# Patient Record
Sex: Male | Born: 1940 | Race: Black or African American | Hispanic: No | Marital: Married | Smoking: Never smoker
Health system: Southern US, Community
[De-identification: ages and names within clinical notes are randomized; demographics above are authoritative.]

## PROBLEM LIST (undated history)

## (undated) DIAGNOSIS — G4730 Sleep apnea, unspecified: Secondary | ICD-10-CM

## (undated) DIAGNOSIS — D126 Benign neoplasm of colon, unspecified: Secondary | ICD-10-CM

## (undated) DIAGNOSIS — Z95810 Presence of automatic (implantable) cardiac defibrillator: Secondary | ICD-10-CM

## (undated) DIAGNOSIS — N529 Male erectile dysfunction, unspecified: Secondary | ICD-10-CM

## (undated) DIAGNOSIS — K769 Liver disease, unspecified: Secondary | ICD-10-CM

## (undated) DIAGNOSIS — I10 Essential (primary) hypertension: Secondary | ICD-10-CM

## (undated) DIAGNOSIS — I428 Other cardiomyopathies: Secondary | ICD-10-CM

## (undated) DIAGNOSIS — K5790 Diverticulosis of intestine, part unspecified, without perforation or abscess without bleeding: Secondary | ICD-10-CM

## (undated) DIAGNOSIS — K648 Other hemorrhoids: Secondary | ICD-10-CM

## (undated) DIAGNOSIS — K603 Anal fistula: Secondary | ICD-10-CM

## (undated) DIAGNOSIS — R519 Headache, unspecified: Secondary | ICD-10-CM

## (undated) DIAGNOSIS — R51 Headache: Secondary | ICD-10-CM

## (undated) DIAGNOSIS — N189 Chronic kidney disease, unspecified: Secondary | ICD-10-CM

## (undated) DIAGNOSIS — M1990 Unspecified osteoarthritis, unspecified site: Secondary | ICD-10-CM

## (undated) DIAGNOSIS — I509 Heart failure, unspecified: Secondary | ICD-10-CM

## (undated) DIAGNOSIS — N281 Cyst of kidney, acquired: Secondary | ICD-10-CM

## (undated) DIAGNOSIS — M109 Gout, unspecified: Secondary | ICD-10-CM

## (undated) HISTORY — DX: Gout, unspecified: M10.9

## (undated) HISTORY — DX: Other hemorrhoids: K64.8

## (undated) HISTORY — DX: Cyst of kidney, acquired: N28.1

## (undated) HISTORY — DX: Anal fistula: K60.3

## (undated) HISTORY — PX: TONSILLECTOMY: SUR1361

## (undated) HISTORY — DX: Chronic kidney disease, unspecified: N18.9

## (undated) HISTORY — DX: Benign neoplasm of colon, unspecified: D12.6

## (undated) HISTORY — DX: Male erectile dysfunction, unspecified: N52.9

## (undated) HISTORY — DX: Other cardiomyopathies: I42.8

## (undated) HISTORY — DX: Diverticulosis of intestine, part unspecified, without perforation or abscess without bleeding: K57.90

## (undated) HISTORY — DX: Unspecified osteoarthritis, unspecified site: M19.90

## (undated) HISTORY — DX: Liver disease, unspecified: K76.9

## (undated) HISTORY — DX: Essential (primary) hypertension: I10

---

## 2001-10-16 DIAGNOSIS — K603 Anal fistula, unspecified: Secondary | ICD-10-CM

## 2001-10-16 HISTORY — DX: Anal fistula: K60.3

## 2001-10-16 HISTORY — DX: Anal fistula, unspecified: K60.30

## 2002-01-14 HISTORY — PX: ANAL FISTULOTOMY: SHX6423

## 2002-01-27 ENCOUNTER — Encounter: Payer: Self-pay | Admitting: General Surgery

## 2002-01-27 ENCOUNTER — Ambulatory Visit (HOSPITAL_COMMUNITY): Admission: RE | Admit: 2002-01-27 | Discharge: 2002-01-27 | Payer: Self-pay | Admitting: General Surgery

## 2004-11-14 ENCOUNTER — Ambulatory Visit: Payer: Self-pay | Admitting: Gastroenterology

## 2004-11-28 ENCOUNTER — Ambulatory Visit: Payer: Self-pay | Admitting: Gastroenterology

## 2005-10-16 DIAGNOSIS — K769 Liver disease, unspecified: Secondary | ICD-10-CM

## 2005-10-16 DIAGNOSIS — N281 Cyst of kidney, acquired: Secondary | ICD-10-CM

## 2005-10-16 HISTORY — DX: Liver disease, unspecified: K76.9

## 2005-10-16 HISTORY — DX: Cyst of kidney, acquired: N28.1

## 2005-11-15 ENCOUNTER — Encounter: Admission: RE | Admit: 2005-11-15 | Discharge: 2005-11-15 | Payer: Self-pay | Admitting: Nephrology

## 2005-12-02 ENCOUNTER — Encounter: Admission: RE | Admit: 2005-12-02 | Discharge: 2005-12-02 | Payer: Self-pay | Admitting: Internal Medicine

## 2009-10-16 DIAGNOSIS — D126 Benign neoplasm of colon, unspecified: Secondary | ICD-10-CM

## 2009-10-16 HISTORY — DX: Benign neoplasm of colon, unspecified: D12.6

## 2009-10-28 ENCOUNTER — Encounter (INDEPENDENT_AMBULATORY_CARE_PROVIDER_SITE_OTHER): Payer: Self-pay | Admitting: *Deleted

## 2009-11-11 ENCOUNTER — Encounter (INDEPENDENT_AMBULATORY_CARE_PROVIDER_SITE_OTHER): Payer: Self-pay

## 2009-11-12 ENCOUNTER — Ambulatory Visit: Payer: Self-pay | Admitting: Gastroenterology

## 2009-12-21 ENCOUNTER — Ambulatory Visit: Payer: Self-pay | Admitting: Gastroenterology

## 2009-12-23 ENCOUNTER — Encounter: Payer: Self-pay | Admitting: Gastroenterology

## 2010-11-15 NOTE — Letter (Signed)
Summary: Moviprep Instructions  [facility]  [address]   [place], [place] [address]   Phone: [phone]  Fax: [phone]       Miguel Roach    06-Feb-1941    MRN: [id number]        Procedure Day /Date: Wednesday 12-01-09     Arrival Time: 9:30 a.m.     Procedure Time: 10:30 a.m.     Location of Procedure:                    x   [place] (4th Floor)                        [place]   Starting 5 days prior to your procedure 11-26-09  do not eat nuts, seeds, popcorn, corn, beans, peas,  salads, or any raw vegetables.  Do not take any fiber supplements (e.g. Metamucil, Citrucel, and Benefiber).  THE DAY BEFORE YOUR PROCEDURE         DATE:  11-30-09  DAY: Tuesday  1.  Drink clear liquids the entire day-NO SOLID FOOD  2.  Do not drink anything colored red or purple.  Avoid juices with pulp.  No orange juice.   3.  Drink at least 64 oz. (8 glasses) of fluid/clear liquids during the day to prevent dehydration and help the prep work efficiently.  CLEAR LIQUIDS INCLUDE: Water Jello Ice Popsicles Tea (sugar ok, no milk/cream) Powdered fruit flavored drinks Coffee (sugar ok, no milk/cream) Gatorade Juice: apple, white grape, white cranberry  Lemonade Clear bullion, consomm, broth Carbonated beverages (any kind) Strained chicken noodle soup Hard Candy                             4.  In the morning, mix first dose of MoviPrep solution:    Empty 1 Pouch A and 1 Pouch B into the disposable container    Add lukewarm drinking water to the top line of the container. Mix to dissolve    Refrigerate (mixed solution should be used within 24 hrs)  5.  Begin drinking the prep at 5:00 p.m. The MoviPrep container is divided by 4 marks.   Every 15 minutes drink the solution down to the next mark (approximately 8 oz) until the full liter is complete.   6.  Follow completed prep with 16 oz of clear liquid of your choice  (Nothing red or purple).  Continue to drink clear liquids until bedtime.  7.  Before going to bed, mix second dose of MoviPrep solution:    Empty 1 Pouch A and 1 Pouch B into the disposable container    Add lukewarm drinking water to the top line of the container. Mix to dissolve    Refrigerate  THE DAY OF YOUR PROCEDURE      DATE:  12-01-09  DAY: Wednesday  Beginning at 5:30 a.m. (5 hours before procedure):         1. Every 15 minutes, drink the solution down to the next mark (approx 8 oz) until the full liter is complete.  2. Follow completed prep with 16 oz. of clear liquid of your choice.    3. You may drink clear liquids until  8:30 a.m.  (2 HOURS BEFORE PROCEDURE).   MEDICATION INSTRUCTIONS  Unless otherwise instructed, you should take regular prescription medications with a small sip of water  as early as possible the morning of your procedure.    Additional medication instructions: _Do not take fluid pill am of procedure.         OTHER INSTRUCTIONS  You will need a responsible adult at least 69 years of age to accompany you and drive you home.   This person must remain in the waiting room during your procedure.  Wear loose fitting clothing that is easily removed.  Leave jewelry and other valuables at home.  However, you may wish to bring a book to read or  an iPod/MP3 player to listen to music as you wait for your procedure to start.  Remove all body piercing jewelry and leave at home.  Total time from sign-in until discharge is approximately 2-3 hours.  You should go home directly after your procedure and rest.  You can resume normal activities the  day after your procedure.  The day of your procedure you should not:   Drive   Make legal decisions   Operate machinery   Drink alcohol   Return to work  You will receive specific instructions about eating, activities and medications before you leave.    The above instructions have been reviewed  and explained to me by   Cornelia Copa RN  November 12, 2009 1:56 PM     I fully understand and can verbalize these instructions _____________________________ Date _________

## 2010-11-15 NOTE — Letter (Signed)
Summary: Colonoscopy Letter  [facility]  [place], [place] [address]   Phone: [phone]  Fax: [phone]      October 28, 2009 MRN: [id number]   [facility] [address] [place], [place]  [address]   Dear Mr. Kinnear,   According to your medical record, it is time for you to schedule a Colonoscopy. The American Cancer Society recommends this procedure as a method to detect early colon cancer. Patients with a family history of colon cancer, or a personal history of colon polyps or inflammatory bowel disease are at increased risk.  This letter has beeen generated based on the recommendations made at the time of your procedure. If you feel that in your particular situation this may no longer apply, please contact our office.  Please call our office at [phone] to schedule this appointment or to update your records at your earliest convenience.  Thank you for cooperating with [place] to provide you with the very best care possible.   Sincerely,  Norberto Sorenson T. Fuller Plan, M.D.  [facility]astroenterology [facility] [phone]

## 2010-11-15 NOTE — Procedures (Signed)
Summary: Colonoscopy  Patient: [name] Note: All result statuses are Final unless otherwise noted.  Tests: (1) Colonoscopy (COL)   COL Colonoscopy           [address]     [place], [place]  [address]           COLONOSCOPY PROCEDURE REPORT           PATIENT:  Miguel Roach, Miguel Roach  MR#:  [id number]     BIRTHDATE:  July 11, 1941, 36 yrs. old  GENDER:  male           ENDOSCOPIST:  Norberto Sorenson T. Fuller Plan, MD, [facility]           PROCEDURE DATE:  12/21/2009     PROCEDURE:  Colonoscopy with biopsy     ASA CLASS:  Class II     INDICATIONS:  1) follow-up of polyp, adenomatous polyp, 10/2001.           MEDICATIONS:   Fentanyl 50 mcg IV, Versed 3 mg IV           DESCRIPTION OF PROCEDURE:   After the risks benefits and     alternatives of the procedure were thoroughly explained, informed     consent was obtained.  Digital rectal exam was performed and     revealed no abnormalities.   The LB PCF-H180AL [id number] endoscope     was introduced through the anus and advanced to the cecum, which     was identified by both the appendix and ileocecal valve, without     limitations.  The quality of the prep was excellent, using     MoviPrep.  The instrument was then slowly withdrawn as the colon     was fully examined.     <<PROCEDUREIMAGES>>           FINDINGS:  A sessile polyp was found in the proximal transverse     colon. It was 4 mm in size. The polyp was removed using cold     biopsy forceps.  Mild diverticulosis was found in the sigmoid     colon.  his was otherwise a normal examination of the colon.     Retroflexed views in the rectum revealed internal hemorrhoids,     small. The time to cecum =  1.75  minutes. The scope was then     withdrawn (time =  8.5  min) from the patient and the procedure     completed.           COMPLICATIONS:  None           ENDOSCOPIC IMPRESSION:     1) 4 mm sessile polyp in the proximal transverse colon     2) Mild diverticulosis in  the sigmoid colon     3) Internal hemorrhoids           RECOMMENDATIONS:     1) Await pathology results     2) High fiber diet with liberal fluid intake.     3) Repeat Colonoscopy in 5 years.           Pricilla Riffle. Fuller Plan, MD, [facility]           CC: Alden Server, MD           n.     Lorrin MaisPricilla Riffle. Stark at 12/21/2009 12:15 PM           Laurance Flatten,  [place], [place] [id number]  Note: An exclamation mark (!) indicates a result that was not dispersed into the flowsheet. Document Creation Date: 12/21/2009 12:15 PM _______________________________________________________________________  (1) Order result status: Final Collection or observation date-time: 12/21/2009 12:09 Requested date-time:  Receipt date-time:  Reported date-time:  Referring Physician:   Ordering Physician: Lucio Edward [phone]) Specimen Source:  Source: Tawanna Cooler Order Number: [id number] Lab site:   Appended Document: Colonoscopy     Procedures Next Due Date:    Colonoscopy: 12/2014

## 2010-11-15 NOTE — Letter (Signed)
Summary: Patient Notice- Polyp Results  [facility]  [address] [place], [place] [address]   Phone: [phone]  Fax: [phone]        December 23, 2009 MRN: [id number]    [facility] [address] [place], [place]  [address]    Dear Mr. Weedon,  I am pleased to inform you that the colon polyp(s) removed during your recent colonoscopy was (were) found to be benign (no cancer detected) upon pathologic examination.  I recommend you have a repeat colonoscopy examination in 5 years to look for recurrent polyps, as having colon polyps increases your risk for having recurrent polyps or even colon cancer in the future.  Should you develop new or worsening symptoms of abdominal pain, bowel habit changes or bleeding from the rectum or bowels, please schedule an evaluation with either your primary care physician or with me.  Continue treatment plan as outlined the day of your exam.  Please call us if you are having persistent problems or have questions about your condition that have not been fully answered at this time.  Sincerely,  Ladene Artist MD [facility]  This letter has been electronically signed by your physician.  Appended Document: Patient Notice- Polyp Results Letter mailed 3.10.11

## 2010-11-15 NOTE — Miscellaneous (Signed)
Summary: Lec previsit  Clinical Lists Changes  Medications: Added new medication of MOVIPREP 100 GM  SOLR (PEG-KCL-NACL-NASULF-NA ASC-C) As per prep instructions. - Signed Rx of MOVIPREP 100 GM  SOLR (PEG-KCL-NACL-NASULF-NA ASC-C) As per prep instructions.;  #1 x 0;  Signed;  Entered by: Cornelia Copa RN;  Authorized by: Ladene Artist MD [facility];  Method used: Electronically to [facility] [phone]*, [address] [place], [place], [place]  [address], Ph: [phone], Fax: [phone] Observations: Added new observation of ALLERGY REV: Done (11/12/2009 13:21)    Prescriptions: MOVIPREP 100 GM  SOLR (PEG-KCL-NACL-NASULF-NA ASC-C) As per prep instructions.  #1 x 0   Entered by:   Cornelia Copa RN   Authorized by:   Ladene Artist MD [facility]   Signed by:   Cornelia Copa RN on 11/12/2009   Method used:   Electronically to        [facility] [phone]* (retail)       [address]       [place],   [address]       Ph: [phone]       Fax: [phone]   RxID:   [id number]

## 2011-03-03 NOTE — Op Note (Signed)
[facility]  Patient:    NYEL, BACAK Visit Number: [id number] MRN: [id number]          Service Type: DSU Location: DAY Attending Physician:  Maxwell Marion Dictated by:   Lew Dawes Rosana Hoes, M.D. Proc. Date: 01/27/02 Admit Date:  01/27/2002   CC:         Lynnell Chad. Shelia Media, M.D.  Pricilla Riffle. Dagoberto Ligas., M.D. 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  POSTOPERATIVE DIAGNOSIS:  Fistula-in-ano.  PROCEDURE:  Fistulotomy, proctoscopy.  SURGEON:  Timothy E. Rosana Hoes, M.D.  ANESTHESIA:  General.  INDICATION:  Mr. Ladue is 12, but otherwise well.  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  DESCRIPTION OF PROCEDURE:  He was prepared at home, evaluated by anesthesia, and then taken to the operating room and placed supine, mask anesthesia provided  He was placed in lithotomy position, perianal inspected, and prepped and draped in the usual fashion.  Proctoscopy accomplished to 20 cm and was normal.  The scope withdrawn.  Perianal area reprepped and draped.  The fistula tract was just to the right of the posterior midline approximately 3 cm from the anal verge.  Digital and anoscopic exam, however, was negative. I used a heparin injector and put methylene blue under gentle pressure into the fistula tract, and none of the dye entered the anorectal canal.  That was washed away, and then the fistula tract was laid open with heavy debridement on the sides, walls, and base of the fistula.  It did pass beneath the internal sphincter but superficial to the external sphincter.  Neither sphincter was divided because the fistula could be debrided well without division of the sphincters.  Cautery was used to control bleeding, and the wound was packed with Gelfoam and a dry sterile dressing.  He tolerated it well, was awakened, and  taken to the recovery room in good condition.  Careful written and verbal instructions were given along with Percocet #36, and he will followed as an outpatient. Dictated by:   Lew Dawes Rosana Hoes, M.D. Attending Physician:  Maxwell Marion DD:  01/27/02 TD:  01/27/02 Job: [phone] [id number]

## 2011-03-09 ENCOUNTER — Encounter: Payer: Self-pay | Admitting: *Deleted

## 2013-07-28 ENCOUNTER — Encounter: Payer: Self-pay | Admitting: Gastroenterology

## 2013-07-29 ENCOUNTER — Encounter: Payer: Self-pay | Admitting: *Deleted

## 2013-07-31 ENCOUNTER — Ambulatory Visit (INDEPENDENT_AMBULATORY_CARE_PROVIDER_SITE_OTHER): Payer: Medicare Other | Admitting: Gastroenterology

## 2013-07-31 ENCOUNTER — Encounter: Payer: Self-pay | Admitting: Gastroenterology

## 2013-07-31 VITALS — BP 122/80 | HR 78 | Ht 71.0 in | Wt 178.8 lb

## 2013-07-31 DIAGNOSIS — K625 Hemorrhage of anus and rectum: Secondary | ICD-10-CM

## 2013-07-31 DIAGNOSIS — K648 Other hemorrhoids: Secondary | ICD-10-CM

## 2013-07-31 NOTE — Patient Instructions (Signed)
Use the rectal suppositories twice daily.  Use 2 stool softeners  Twice daily.. Colace brand or the generic store brand is fine.   We made you a follow up with Dr. Lucio Edward on 09-02-2013 at 9:45 am.

## 2013-07-31 NOTE — Progress Notes (Signed)
Reviewed and agree with management plan.  Asah Lamay T. Sandi Towe, MD FACG 

## 2013-07-31 NOTE — Progress Notes (Signed)
07/31/2013 Miguel Roach [id number] 11-20-1940   HISTORY OF PRESENT ILLNESS:  This is a very pleasant 72 year old male who is a patient of Dr. Lynne Leader. He comes to our office today complaining of rectal bleeding that he has experienced sporadically over the past month. It occurs only with bowel movements and is described as red blood seen on the stool and on the toilet paper. He did have some bleeding with bowel movement this morning. He tried using over-the-counter Preparation H with no improvement. He reports some mild constipation some time prior to when the bleeding began. He denies any rectal pain or discomfort. He saw his PCP who placed him on hydrocortisone suppositories twice daily and he has been using this for the past 3 days. Once again, he did have some bleeding again this morning.  He has never experienced issues with bleeding in the past.  His last colonoscopy was in March of 2011 at which time he had 1 adenomatous polyp removed from the proximal transverse colon. He also had mild diverticulosis in the sigmoid colon and internal hemorrhoids at that time. It was recommended he have another colonoscopy in 5 years(March 2016).   Past Medical History  Diagnosis Date  . Liver lesion 2007  . Renal cyst 2007  . Diverticulosis   . Internal hemorrhoids   . Adenomatous colon polyp 2011  . Gout   . CRI (chronic renal insufficiency)   . Hypertension   . ED (erectile dysfunction)    Past Surgical History  Procedure Laterality Date  . Fistula in ano repair      reports that he has never smoked. He has never used smokeless tobacco. He reports that he does not drink alcohol or use illicit drugs. family history includes Heart failure in his mother; Sickle cell anemia in his brother. No Known Allergies    Outpatient Encounter Prescriptions as of 07/31/2013  Medication Sig Dispense Refill  . amLODipine-olmesartan (AZOR) 5-40 MG per tablet Take 1 tablet by mouth daily.      .  hydrocortisone (ANUSOL-HC) 25 MG suppository Place 25 mg rectally 2 (two) times daily.       No facility-administered encounter medications on file as of 07/31/2013.     REVIEW OF SYSTEMS  : All other systems reviewed and negative except where noted in the History of Present Illness.   PHYSICAL EXAM: BP 122/80  Pulse 78  Ht 5\' 11"  (1.803 m)  Wt 178 lb 12.8 oz (81.103 kg)  BMI 24.95 kg/m2 General: Well developed black male in no acute distress Head: Normocephalic and atraumatic Eyes:  Sclerae anicteric, conjunctiva pink. Ears: Normal auditory acuity. Heart: Regular rate and rhythm. Rectal: No external hemorrhoids noted.  Anoscopy revealed a moderate-sized red hemorrhoid with stigmata of recent bleeding. Musculoskeletal: Symmetrical with no gross deformities  Skin: No lesions on visible extremities Extremities: No edema  Neurological: Alert oriented x 4, grossly nonfocal Psychological:  Alert and cooperative. Normal mood and affect  ASSESSMENT AND PLAN: -Rectal bleeding:  Secondary to internal hemorrhoid seen on anoscopy. He will continue to use the hydrocortisone suppositories twice daily for a total of 2 weeks. He denies much constipation but will begin taking two stool softeners daily. He will followup in approximately 4 weeks, but will call sooner if the issue continues despite the suppositories.  ? discussion regarding banding, however, this is his first experience with bleeding so we will see how he responds with the suppositories for now.

## 2013-09-02 ENCOUNTER — Ambulatory Visit (INDEPENDENT_AMBULATORY_CARE_PROVIDER_SITE_OTHER): Payer: Medicare Other | Admitting: Gastroenterology

## 2013-09-02 ENCOUNTER — Encounter: Payer: Self-pay | Admitting: Gastroenterology

## 2013-09-02 VITALS — BP 114/60 | HR 80 | Ht 69.5 in | Wt 180.1 lb

## 2013-09-02 DIAGNOSIS — K921 Melena: Secondary | ICD-10-CM

## 2013-09-02 DIAGNOSIS — Z860100 Personal history of colon polyps, unspecified: Secondary | ICD-10-CM

## 2013-09-02 DIAGNOSIS — K648 Other hemorrhoids: Secondary | ICD-10-CM

## 2013-09-02 MED ORDER — HYDROCORTISONE ACETATE 25 MG RE SUPP: 25.0000 mg | Freq: Two times a day (BID) | RECTAL | Status: DC

## 2013-09-02 NOTE — Progress Notes (Signed)
    History of Present Illness: This is a 72 year old male with small volume hematochezia as who was evaluated in our office by Alonza Bogus, PAC in one month ago. Anoscopy showed internal hemorrhoids and he's been treated with suppositories and stool softeners. After several days the suppositories his rectal bleeding abated he has no gastrointestinal complaints today.  Current Medications, Allergies, Past Medical History, Past Surgical History, Family History and Social History were reviewed in [employer] record.  Physical Exam: General: Well developed , well nourished, no acute distress Head: Normocephalic and atraumatic Eyes:  sclerae anicteric, EOMI Ears: Normal auditory acuity Mouth: No deformity or lesions Lungs: Clear throughout to auscultation Heart: Regular rate and rhythm; no murmurs, rubs or bruits Abdomen: Soft, non tender and non distended. No masses, hepatosplenomegaly or hernias noted. Normal Bowel sounds Rectal: Deferred with recent DRE and anoscopy Musculoskeletal: Symmetrical with no gross deformities  Pulses:  Normal pulses noted Extremities: No clubbing, cyanosis, edema or deformities noted Neurological: Alert oriented x 4, grossly nonfocal Psychological:  Alert and cooperative. Normal mood and affect  Assessment and Recommendations:  1. Hematochezia likely secondary to internal hemorrhoids. Continue stool softeners area retreatment with hydrocortisone suppositories if symptoms return. If symptoms persist and are not easily controlled with suppositories he is to contact our office for further advice.  2. Personal history of adenomatous colon polyps. A five-year interval surveillance colonoscopy is recommended in March 2016.

## 2013-09-02 NOTE — Patient Instructions (Addendum)
We have sent the following medications to your pharmacy for you to pick up at your convenience: Hydrocortisone suppository.  You will be due for a recall colonoscopy in 12/2014. We will send you a reminder in the mail when it gets closer to that time.  Thank you for choosing me and [facility].  Pricilla Riffle. Dagoberto Ligas., MD., [facility]  cc: Deland Pretty, MD

## 2013-10-28 ENCOUNTER — Ambulatory Visit: Payer: Medicare Other | Attending: Orthopedic Surgery | Admitting: Physical Therapy

## 2013-10-28 DIAGNOSIS — M25519 Pain in unspecified shoulder: Secondary | ICD-10-CM | POA: Insufficient documentation

## 2013-10-28 DIAGNOSIS — M25619 Stiffness of unspecified shoulder, not elsewhere classified: Secondary | ICD-10-CM | POA: Insufficient documentation

## 2013-10-28 DIAGNOSIS — M7500 Adhesive capsulitis of unspecified shoulder: Secondary | ICD-10-CM | POA: Insufficient documentation

## 2013-10-28 DIAGNOSIS — IMO0001 Reserved for inherently not codable concepts without codable children: Secondary | ICD-10-CM | POA: Insufficient documentation

## 2014-09-15 ENCOUNTER — Other Ambulatory Visit: Payer: Self-pay | Admitting: Internal Medicine

## 2014-09-15 DIAGNOSIS — R202 Paresthesia of skin: Secondary | ICD-10-CM

## 2014-09-16 ENCOUNTER — Ambulatory Visit
Admission: RE | Admit: 2014-09-16 | Discharge: 2014-09-16 | Disposition: A | Discharge status: Home / Self Care | Payer: Medicare Other | Source: Ambulatory Visit | Attending: Internal Medicine | Admitting: Internal Medicine

## 2014-09-16 DIAGNOSIS — R202 Paresthesia of skin: Secondary | ICD-10-CM

## 2014-12-14 ENCOUNTER — Encounter: Payer: Self-pay | Admitting: Gastroenterology

## 2014-12-17 ENCOUNTER — Encounter: Payer: Self-pay | Admitting: Gastroenterology

## 2015-02-08 ENCOUNTER — Ambulatory Visit (AMBULATORY_SURGERY_CENTER): Payer: Self-pay | Admitting: *Deleted

## 2015-02-08 VITALS — Ht 71.0 in | Wt 171.6 lb

## 2015-02-08 DIAGNOSIS — Z86010 Personal history of colonic polyps: Secondary | ICD-10-CM

## 2015-02-08 MED ORDER — NA SULFATE-K SULFATE-MG SULF 17.5-3.13-1.6 GM/177ML PO SOLN: ORAL | Status: DC

## 2015-02-08 NOTE — Progress Notes (Signed)
No allergies to eggs or soy. No problems with anesthesia.  Pt given Emmi instructions for colonoscopy  No oxygen use  No diet drug use  

## 2015-02-17 ENCOUNTER — Ambulatory Visit (AMBULATORY_SURGERY_CENTER): Payer: Medicare Other | Admitting: Gastroenterology

## 2015-02-17 ENCOUNTER — Encounter: Payer: Self-pay | Admitting: Gastroenterology

## 2015-02-17 VITALS — BP 111/60 | HR 65 | Temp 96.7°F | Resp 19 | Ht 71.0 in | Wt 171.0 lb

## 2015-02-17 DIAGNOSIS — Z86010 Personal history of colonic polyps: Secondary | ICD-10-CM | POA: Diagnosis not present

## 2015-02-17 DIAGNOSIS — D125 Benign neoplasm of sigmoid colon: Secondary | ICD-10-CM | POA: Diagnosis not present

## 2015-02-17 DIAGNOSIS — D123 Benign neoplasm of transverse colon: Secondary | ICD-10-CM

## 2015-02-17 DIAGNOSIS — D122 Benign neoplasm of ascending colon: Secondary | ICD-10-CM

## 2015-02-17 MED ORDER — SODIUM CHLORIDE 0.9 % IV SOLN: 500.0000 mL | INTRAVENOUS | Status: DC

## 2015-02-17 NOTE — Patient Instructions (Signed)
See Procedure Report for findings and recommendations  HOLD ASPRIN AND NSAIDS FOR 2 WEEKS  YOU HAD AN ENDOSCOPIC PROCEDURE TODAY AT [facility]:   Refer to the procedure report that was given to you for any specific questions about what was found during the examination.  If the procedure report does not answer your questions, please call your gastroenterologist to clarify.  If you requested that your care partner not be given the details of your procedure findings, then the procedure report has been included in a sealed envelope for you to review at your convenience later.  YOU SHOULD EXPECT: Some feelings of bloating in the abdomen. Passage of more gas than usual.  Walking can help get rid of the air that was put into your GI tract during the procedure and reduce the bloating. If you had a lower endoscopy (such as a colonoscopy or flexible sigmoidoscopy) you may notice spotting of blood in your stool or on the toilet paper. If you underwent a bowel prep for your procedure, you may not have a normal bowel movement for a few days.  Please Note:  You might notice some irritation and congestion in your nose or some drainage.  This is from the oxygen used during your procedure.  There is no need for concern and it should clear up in a day or so.  SYMPTOMS TO REPORT IMMEDIATELY:   Following lower endoscopy (colonoscopy or flexible sigmoidoscopy):  Excessive amounts of blood in the stool  Significant tenderness or worsening of abdominal pains  Swelling of the abdomen that is new, acute  Fever of 100F or higher   Following upper endoscopy (EGD)  Vomiting of blood or coffee ground material  New chest pain or pain under the shoulder blades  Painful or persistently difficult swallowing  New shortness of breath  Fever of 100F or higher  Black, tarry-looking stools  For urgent or emergent issues, a gastroenterologist can be reached at any hour by calling [phone].   DIET:  Your first meal following the procedure should be a small meal and then it is ok to progress to your normal diet. Heavy or fried foods are harder to digest and may make you feel nauseous or bloated.  Likewise, meals heavy in dairy and vegetables can increase bloating.  Drink plenty of fluids but you should avoid alcoholic beverages for 24 hours.  ACTIVITY:  You should plan to take it easy for the rest of today and you should NOT DRIVE or use heavy machinery until tomorrow (because of the sedation medicines used during the test).    FOLLOW UP: Our staff will call the number listed on your records the next business day following your procedure to check on you and address any questions or concerns that you may have regarding the information given to you following your procedure. If we do not reach you, we will leave a message.  However, if you are feeling well and you are not experiencing any problems, there is no need to return our call.  We will assume that you have returned to your regular daily activities without incident.  If any biopsies were taken you will be contacted by phone or by letter within the next 1-3 weeks.  Please call us at [phone] if you have not heard about the biopsies in 3 weeks.    SIGNATURES/CONFIDENTIALITY: You and/or your care partner have signed paperwork which will be entered into your electronic medical record.  These signatures attest  to the fact that that the information above on your After Visit Summary has been reviewed and is understood.  Full responsibility of the confidentiality of this discharge information lies with you and/or your care-partner.  Please follow all discharge instructions given to you by the recovery room nurse. If you have any questions or problems after discharge please call one of the numbers listed above. You will receive a phone call in the am to see how you are doing and answer any questions you may have. Thank you for choosing [facility] for your health care needs.

## 2015-02-17 NOTE — Progress Notes (Signed)
Called to room to assist during endoscopic procedure.  Patient ID and intended procedure confirmed with present staff. Received instructions for my participation in the procedure from the performing physician.  

## 2015-02-17 NOTE — Progress Notes (Signed)
Stable to RR 

## 2015-02-17 NOTE — Op Note (Signed)
[place]  [employer]. [place], [address]   COLONOSCOPY PROCEDURE REPORT PATIENT: Miguel Roach, Miguel Roach  MR#: [id number] BIRTHDATE: 27-May-1941 , 35  yrs. old GENDER: male ENDOSCOPIST: Ladene Artist, MD, [facility] REFERRED OP:[id number] Delight Hoh, M.D. PROCEDURE DATE:  02/17/2015 PROCEDURE:   Colonoscopy, surveillance , Colonoscopy with biopsy, and Colonoscopy with snare polypectomy First Screening Colonoscopy - Avg.  risk and is 50 yrs.  old or older - No.  Prior Negative Screening - Now for repeat screening. N/A  History of Adenoma - Now for follow-up colonoscopy & has been > or = to 3 yrs.  Yes hx of adenoma.  Has been 3 or more years since last colonoscopy.  Polyps Removed Today ASA CLASS:   Class II INDICATIONS:Surveillance due to prior colonic neoplasia and PH Colon Adenoma. MEDICATIONS: Monitored anesthesia care, Propofol 250 mg IV, and lidocaine 40 mg IV DESCRIPTION OF PROCEDURE:   After the risks benefits and alternatives of the procedure were thoroughly explained, informed consent was obtained.  The digital rectal exam revealed no abnormalities of the rectum.   The LB [id number]  endoscope was introduced through the anus and advanced to the cecum, which was identified by both the appendix and ileocecal valve. No adverse events experienced.   The quality of the prep was good.  (Suprep was used)  The instrument was then slowly withdrawn as the colon was fully examined.  COLON FINDINGS: A sessile polyp measuring 5 mm in size was found in the ascending colon.  A polypectomy was performed with cold forceps.  The resection was complete, the polyp tissue was completely retrieved and sent to histology.  A sessile polyp measuring 6 mm in size was found in the transverse colon.  A polypectomy was performed with a cold snare.  The resection was complete, the polyp tissue was completely retrieved and sent to histology. A semi-pedunculated polyp measuring 8 mm in size  was found in the sigmoid colon.  A polypectomy was performed  using snare cautery. The resection was complete, the polyp tissue was completely retrieved and sent to histology. There was mild diverticulosis noted in the sigmoid colon. The examination was otherwise normal.  Retroflexed views revealed internal Grade I hemorrhoids. The time to cecum = 1.6 Withdrawal time = 10.0   The scope was withdrawn and the procedure completed. COMPLICATIONS: There were no immediate complications. ENDOSCOPIC IMPRESSION: 1.   Sessile polyp in the ascending colon; polypectomy performed with cold forceps 2.   Sessile polyp in the transverse colon; polypectomy performed with a cold snare 3.   Semi-pedunculated polyp was found in the sigmoid colon; polypectomy performed using snare cautery 4.   Mild diverticulosis in the sigmoid colon 5.   Grade l internal hemorrhoids RECOMMENDATIONS: 1.  Await pathology results 2.  Hold Aspirin and all other NSAIDS for 2 weeks. 3.  High fiber diet with liberal fluid intake. 4.  Repeat Colonoscopy in 5 years. eSigned:  Ladene Artist, MD, [facility] 02/17/2015 11:00 AM

## 2015-02-18 ENCOUNTER — Telehealth: Payer: Self-pay | Admitting: *Deleted

## 2015-02-18 NOTE — Telephone Encounter (Signed)
No answer, left message to call if questions or concerns. 

## 2015-02-23 ENCOUNTER — Encounter: Payer: Self-pay | Admitting: Gastroenterology

## 2015-04-01 ENCOUNTER — Telehealth: Payer: Self-pay | Admitting: Cardiology

## 2015-04-01 NOTE — Telephone Encounter (Signed)
Received records from [facility] for appointment with Dr Percival Spanish 04/02/15 at [facility].  Records given to Dr Percival Spanish nurse for Dr Rosezella Florida schedule on 04/02/15 in [place] office. lp

## 2015-04-02 ENCOUNTER — Ambulatory Visit (INDEPENDENT_AMBULATORY_CARE_PROVIDER_SITE_OTHER): Payer: Medicare Other | Admitting: Cardiology

## 2015-04-02 ENCOUNTER — Encounter: Payer: Self-pay | Admitting: Cardiology

## 2015-04-02 VITALS — BP 120/82 | HR 80 | Ht 71.0 in | Wt 165.0 lb

## 2015-04-02 DIAGNOSIS — R0600 Dyspnea, unspecified: Secondary | ICD-10-CM | POA: Diagnosis not present

## 2015-04-02 DIAGNOSIS — I420 Dilated cardiomyopathy: Secondary | ICD-10-CM | POA: Insufficient documentation

## 2015-04-02 NOTE — Progress Notes (Signed)
Cardiology Office Note   Date:  04/02/2015   ID:  Miguel Roach, DOB 12-Aug-1941, MRN [id number]  PCP:  Miguel Pel, MD  Cardiologist:   Miguel Breeding, MD   Chief Complaint  Patient presents with  . Shortness of Breath      History of Present Illness: Miguel Roach is a 74 y.o. male who presents for evaluation of dyspnea. He was seen in 2010 by another cardiologist. He was noted at that time to have an ejection fraction of about 40-45%. I don't see the echo report but do see this reported in his primary care notes. There is also mention of a negative stress test. However, patient did not go back for follow-up. He had no further problems.  He was having some shortness of breath at that time. He's noted now to have shortness of breath which he noticed about a week ago or so. He was walking up an incline. He felt more fatigued with less exercise tolerance than he should typically have. He had some shortness of breath.  He was noted on his EKG his primary care appointment to have some premature ectopic complexes which she says he's had before. He does not feel palpitations. He does not have presyncope or syncope. He does not have chest pressure, neck or arm discomfort. He does not have PND or orthopnea. He's had a little fatigue at other times since this event. However, he is also able to exercise without bringing on overt symptoms. He's had no weight gain or edema.  In fact he's lost 15 pounds over the last 6 months without trying.   Past Medical History  Diagnosis Date  . Liver lesion 2007  . Renal cyst 2007  . Diverticulosis   . Internal hemorrhoids   . Adenomatous colon polyp 2011  . Gout   . CRI (chronic renal insufficiency)     creatinine back to normal  . Hypertension   . ED (erectile dysfunction)   . Arthritis   . Anal fistula 2003    Past Surgical History  Procedure Laterality Date  . Treatment fistula anal  2003    rapair     Current Outpatient  Prescriptions  Medication Sig Dispense Refill  . amLODipine-olmesartan (AZOR) 5-40 MG per tablet Take 1 tablet by mouth daily.    Marland Kitchen aspirin 81 MG tablet Take 81 mg by mouth once a week.    . colchicine 0.6 MG tablet Take 0.6 mg by mouth daily.    . irbesartan (AVAPRO) 300 MG tablet Take 300 mg by mouth daily.    . Multiple Vitamin (MULTIVITAMIN) tablet Take 1 tablet by mouth daily.     No current facility-administered medications for this visit.    Allergies:   Review of patient's allergies indicates no known allergies.    Social History:  The patient  reports that he has never smoked. He has never used smokeless tobacco. He reports that he does not drink alcohol or use illicit drugs.   Family History:  The patient's family history includes Breast cancer in his sister; Clotting disorder in an other family member; Colon polyps in his brother; Diabetes in his father and sister; Heart failure (age of onset: 77) in his mother; Sickle cell anemia in his brother.    ROS:  Please see the history of present illness.   Otherwise, review of systems are positive for constipation, mild cough with mucus..   All other systems are reviewed and negative.  PHYSICAL EXAM: VS:  BP 120/82 mmHg  Pulse 80  Ht 5\' 11"  (1.803 m)  Wt 165 lb (74.844 kg)  BMI 23.02 kg/m2  SpO2 99% , BMI Body mass index is 23.02 kg/(m^2). GENERAL:  Well appearing HEENT:  Pupils equal round and reactive, fundi not visualized, oral mucosa unremarkable NECK:  No jugular venous distention, waveform within normal limits, carotid upstroke brisk and symmetric, no bruits, no thyromegaly LYMPHATICS:  No cervical, inguinal adenopathy LUNGS:  Clear to auscultation bilaterally BACK:  No CVA tenderness CHEST:  Unremarkable HEART:  PMI is displaced or sustained,S1 and S2 within normal limits, no S3, S4, no clicks, no rubs, no murmurs ABD:  Flat, positive bowel sounds normal in frequency in pitch, no bruits, no rebound, no guarding, no  midline pulsatile mass, no hepatomegaly, no splenomegaly EXT:  2 plus pulses throughout, no edema, no cyanosis no clubbing SKIN:  No rashes no nodules NEURO:  Cranial nerves II through XII grossly intact, motor grossly intact throughout PSYCH:  Cognitively intact, oriented to person place and time    EKG:  EKG is ordered today. The ekg ordered today demonstrates sinus rhythm, rate 80, left axis deviation, interventricular conduction delay, premature ectopic complex, no acute ST-T wave changes.   Recent Labs: No results found for requested labs within last 365 days.    Lipid Panel No results found for: CHOL, TRIG, HDL, CHOLHDL, VLDL, LDLCALC, LDLDIRECT    Wt Readings from Last 3 Encounters:  04/02/15 165 lb (74.844 kg)  02/17/15 171 lb (77.565 kg)  02/08/15 171 lb 9.6 oz (77.837 kg)      Other studies Reviewed: Additional studies/ records that were reviewed today include: Office records..    ASSESSMENT AND PLAN:  CARDIOMYOPATHY:  I will check a BNP level. He will need another echocardiogram. This certainly may explain some of his dyspnea. I will check to see if he's had a TSH in the results of her recent chest x-ray.   DYSPNEA:   If his ejection fraction is unchanged I will likely consider stress perfusion imaging to further evaluate his dyspnea.  HTN:  His blood pressure is well controlled on current meds. I'll make no change at this point.  Current medicines are reviewed at length with the patient today.  The patient does not have concerns regarding medicines.  The following changes have been made:  no change     Disposition:   FU with me in 6 weeks.    Signed, Miguel Breeding, MD  04/02/2015 9:25 AM    [facility]

## 2015-04-02 NOTE — Patient Instructions (Addendum)
Your physician has requested that you have an echocardiogram. Echocardiography is a painless test that uses sound waves to create images of your heart. It provides your doctor with information about the size and shape of your heart and how well your heart's chambers and valves are working. This procedure takes approximately one hour. There are no restrictions for this procedure. Lab for BNP & TSH  Office will contact with results via phone or letter.   Continue all current medications. Follow up in  6 weeks - [facility]

## 2015-04-07 ENCOUNTER — Other Ambulatory Visit: Payer: Self-pay

## 2015-04-07 ENCOUNTER — Telehealth: Payer: Self-pay | Admitting: *Deleted

## 2015-04-07 ENCOUNTER — Other Ambulatory Visit (INDEPENDENT_AMBULATORY_CARE_PROVIDER_SITE_OTHER): Payer: Medicare Other | Admitting: *Deleted

## 2015-04-07 ENCOUNTER — Ambulatory Visit (HOSPITAL_COMMUNITY): Payer: Medicare Other | Attending: Cardiology

## 2015-04-07 DIAGNOSIS — R0600 Dyspnea, unspecified: Secondary | ICD-10-CM

## 2015-04-07 DIAGNOSIS — I517 Cardiomegaly: Secondary | ICD-10-CM | POA: Diagnosis not present

## 2015-04-07 DIAGNOSIS — I351 Nonrheumatic aortic (valve) insufficiency: Secondary | ICD-10-CM | POA: Diagnosis not present

## 2015-04-07 DIAGNOSIS — I340 Nonrheumatic mitral (valve) insufficiency: Secondary | ICD-10-CM | POA: Insufficient documentation

## 2015-04-07 DIAGNOSIS — I420 Dilated cardiomyopathy: Secondary | ICD-10-CM | POA: Diagnosis not present

## 2015-04-07 LAB — BASIC METABOLIC PANEL
BUN: 17 mg/dL (ref 6–23)
CHLORIDE: 109 meq/L (ref 96–112)
CO2: 27 mEq/L (ref 19–32)
CREATININE: 1.42 mg/dL (ref 0.40–1.50)
Calcium: 9.1 mg/dL (ref 8.4–10.5)
GFR: 62.78 mL/min (ref >60.00)
Glucose, Bld: 85 mg/dL (ref 70–99)
Potassium: 3.6 mEq/L (ref 3.5–5.1)
Sodium: 142 mEq/L (ref 135–145)

## 2015-04-07 LAB — BRAIN NATRIURETIC PEPTIDE: Pro B Natriuretic peptide (BNP): 1954 pg/mL — ABNORMAL HIGH (ref 0.0–100.0)

## 2015-04-07 LAB — TSH: TSH: 2.14 u[IU]/mL (ref 0.35–4.50)

## 2015-04-07 MED ORDER — FUROSEMIDE 40 MG PO TABS: 40.0000 mg | ORAL_TABLET | Freq: Every day | ORAL | Status: DC

## 2015-04-07 MED ORDER — POTASSIUM CHLORIDE CRYS ER 20 MEQ PO TBCR: EXTENDED_RELEASE_TABLET | ORAL | Status: DC

## 2015-04-07 NOTE — Telephone Encounter (Signed)
-----   Message from Minus Breeding, MD sent at 04/07/2015  4:32 PM EDT ----- EF is much reduced compared with previous.  Please confirm that he is on two ARBs.  This will need to be changed.  I would like to have him seen in the flex clinic or APP when he comes back in five days for labs.  We need to get him off of one of the ARBs (if he is indeed taking two) and get him set up for right and left cath to evaluate his falling EF and increasing dyspnea.  We need to know his follow up creat before scheduling a cath.  He can start a beta blocker if he does not appear to be volume overloaded when he comes back.  Call Mr. Buitrago with the results and send results to Horatio Pel, MD

## 2015-04-07 NOTE — Telephone Encounter (Signed)
Spoke with pt, he voiced understanding of new medications. He also is aware of fluid restrictions and low salt and sodium use. New scripts sent to the pharmacy. He will see lori gerhardt np Tuesday 04-13-15. Patient voiced understanding of appt time and location.

## 2015-04-07 NOTE — Telephone Encounter (Signed)
Spoke with pt, confirmed he is taking both olmesartan and irbesartan. Patient will see lori gerhardt np Tuesday 04-13-15 and have a BMP at that time. Will make dr hochrein aware

## 2015-04-07 NOTE — Progress Notes (Signed)
Consulted Dr. Irish Lack (DOD) for his low EF since his last EF in 2010 was 40-45%.  He suggested the patient to have labs ordered by Dr. Percival Spanish today and go home. 04/07/2015

## 2015-04-07 NOTE — Telephone Encounter (Signed)
Spoke with pt, per dr hochrein, patient instructed to stop irbesartan.

## 2015-04-07 NOTE — Telephone Encounter (Signed)
-----   Message from Minus Breeding, MD sent at 04/07/2015  4:25 PM EDT ----- Echo is pending.  However, BNP is elevated.  Start Lasix 40 mg daily.  4 gm Na diet.  Start 40 meq potassium for the next three days then 20 meq daily.  Repeat a BMET in five days.  Call Mr. Shallenberger with the results and send results to Horatio Pel, MD

## 2015-04-09 ENCOUNTER — Telehealth: Payer: Self-pay | Admitting: *Deleted

## 2015-04-09 NOTE — Telephone Encounter (Signed)
called for fm hx/status, unable to reach pt.Marland KitchenMarland Kitchen

## 2015-04-13 ENCOUNTER — Encounter: Payer: Self-pay | Admitting: Nurse Practitioner

## 2015-04-13 ENCOUNTER — Other Ambulatory Visit: Payer: Self-pay | Admitting: Nurse Practitioner

## 2015-04-13 ENCOUNTER — Ambulatory Visit (INDEPENDENT_AMBULATORY_CARE_PROVIDER_SITE_OTHER): Payer: Medicare Other | Admitting: Nurse Practitioner

## 2015-04-13 VITALS — BP 112/78 | HR 87 | Ht 71.0 in | Wt 158.0 lb

## 2015-04-13 DIAGNOSIS — R0600 Dyspnea, unspecified: Secondary | ICD-10-CM

## 2015-04-13 DIAGNOSIS — I420 Dilated cardiomyopathy: Secondary | ICD-10-CM

## 2015-04-13 DIAGNOSIS — Z01810 Encounter for preprocedural cardiovascular examination: Secondary | ICD-10-CM

## 2015-04-13 LAB — CBC
HCT: 43.9 % (ref 39.0–52.0)
Hemoglobin: 13.9 g/dL (ref 13.0–17.0)
MCHC: 31.8 g/dL (ref 30.0–36.0)
MCV: 86.2 fl (ref 78.0–100.0)
Platelets: 208 10*3/uL (ref 150.0–400.0)
RBC: 5.09 Mil/uL (ref 4.22–5.81)
RDW: 16.2 % — ABNORMAL HIGH (ref 11.5–15.5)
WBC: 9 10*3/uL (ref 4.0–10.5)

## 2015-04-13 LAB — BASIC METABOLIC PANEL
BUN: 20 mg/dL (ref 6–23)
CO2: 28 mEq/L (ref 19–32)
Calcium: 9.3 mg/dL (ref 8.4–10.5)
Chloride: 108 mEq/L (ref 96–112)
Creatinine, Ser: 1.72 mg/dL — ABNORMAL HIGH (ref 0.40–1.50)
GFR: 50.32 mL/min — ABNORMAL LOW (ref >60.00)
Glucose, Bld: 111 mg/dL — ABNORMAL HIGH (ref 70–99)
Potassium: 4.2 mEq/L (ref 3.5–5.1)
Sodium: 143 mEq/L (ref 135–145)

## 2015-04-13 LAB — APTT: aPTT: 31.5 s (ref 23.4–32.7)

## 2015-04-13 LAB — PROTIME-INR
INR: 1.1 ratio — ABNORMAL HIGH (ref 0.8–1.0)
Prothrombin Time: 12.6 s (ref 9.6–13.1)

## 2015-04-13 MED ORDER — CARVEDILOL 3.125 MG PO TABS: 3.1250 mg | ORAL_TABLET | Freq: Two times a day (BID) | ORAL | Status: DC

## 2015-04-13 MED ORDER — OLMESARTAN MEDOXOMIL 40 MG PO TABS: 40.0000 mg | ORAL_TABLET | Freq: Every day | ORAL | Status: DC

## 2015-04-13 NOTE — Patient Instructions (Addendum)
We will be checking the following labs today - BMET, CBC, PT, PTT  Medication Instructions:    Continue with your current medicines but   I am stopping your Azor  Start Olmesartan 40 mg a day - this has been sent to your drug store  Start Coreg 3.125 mg twice a day - morning and night - this has been sent to your drug store    Testing/Procedures To Be Arranged:  Cardiac catheterization    Other Special Instructions:  Your provider has recommended a cardiac catherization  You are scheduled for a cardiac catheterization on Friday, July 1st at 10:30 AM with Dr. Tamala Julian or associate.  Go to [facility] 2nd Floor Short Stay on Friday, July 1st at 8:30 AM.  Enter thru the [facility] entrance A No food or drink after midnight on Thursday. You may take your medications with a sip of water on the day of your procedure.   Coronary Angiogram A coronary angiogram, also called coronary angiography, is an X-ray procedure used to look at the arteries in the heart. In this procedure, a dye (contrast dye) is injected through a long, hollow tube (catheter). The catheter is about the size of a piece of cooked spaghetti and is inserted through your groin, wrist, or arm. The dye is injected into each artery, and X-rays are then taken to show if there is a blockage in the arteries of your heart.  LET [facility] CARE PROVIDER KNOW ABOUT: Any allergies you have, including allergies to shellfish or contrast dye.  All medicines you are taking, including vitamins, herbs, eye drops, creams, and over-the-counter medicines.  Previous problems you or members of your family have had with the use of anesthetics.  Any blood disorders you have.  Previous surgeries you have had. History of kidney problems or failure.  Other medical conditions you have.  RISKS AND COMPLICATIONS  Generally, a coronary angiogram is a safe procedure. However, about 1 person out of 1000 can have problems that may  include: Allergic reaction to the dye. Bleeding/bruising from the access site or other locations. Kidney injury, especially in people with impaired kidney function. Stroke (rare). Heart attack (rare). Irregular rhythms (rare) Death (rare)  BEFORE THE PROCEDURE  Do not eat or drink anything after midnight the night before the procedure or as directed by your health care provider.  Ask your health care provider about changing or stopping your regular medicines. This is especially important if you are taking diabetes medicines or blood thinners.  PROCEDURE You may be given a medicine to help you relax (sedative) before the procedure. This medicine is given through an intravenous (IV) access tube that is inserted into one of your veins.  The area where the catheter will be inserted will be washed and shaved. This is usually done in the groin but may be done in the fold of your arm (near your elbow) or in the wrist.  A medicine will be given to numb the area where the catheter will be inserted (local anesthetic).  The health care provider will insert the catheter into an artery. The catheter will be guided by using a special type of X-ray (fluoroscopy) of the blood vessel being examined.  A special dye will then be injected into the catheter, and X-rays will be taken. The dye will help to show where any narrowing or blockages are located in the heart arteries.   AFTER THE PROCEDURE  If the procedure is done through the leg, you  will be kept in bed lying flat for several hours. You will be instructed to not bend or cross your legs. The insertion site will be checked frequently.  The pulse in your feet or wrist will be checked frequently.  Additional blood tests, X-rays, and an electrocardiogram may be done.    Call the [place] office at [phone] if you have any questions, problems or concerns.

## 2015-04-13 NOTE — Progress Notes (Signed)
CARDIOLOGY OFFICE NOTE  Date:  04/13/2015    Miguel Roach Date of Birth: 03-31-1941 Medical Record [id number]  PCP:  Horatio Pel, MD  Cardiologist:  [facility]    Chief Complaint  Patient presents with  . Pre cath visit - seen for Dr. Percival Spanish.    Seen for Dr. Percival Spanish.     History of Present Illness: Miguel Roach is a 74 y.o. male who presents today for a post echo visit. Seen for Dr. Percival Spanish. He was seen in 2010 by another cardiologist - Dr. Elisabeth Cara. He was noted at that time to have an ejection fraction of about 40-45%. This was reported in his primary care notes. There is also mention of a negative stress test. However, the patient did not go back for follow-up.   Referred back from primary care with exertional fatigue, ectopics on EKG and dyspnea. Echo ordered - see below - EF now at 20 to 25%. Lasix was started. He was on 2 ARBs - this has been addressed. He has lost a considerable amount of weight over the past 6 months that has NOT been intentional.  Comes in today. Here with his wife - [name]. He is doing ok. Continues with exertional fatigue. Has not had chest pain/discomfort. No swelling. Early satiety. Has lost weight. No palpitations noted but says he has always had an irregular heart beat. He is not dizzy or lightheaded. No syncope. No cough.   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   Past Medical History  Diagnosis Date  . Liver lesion 2007  . Renal cyst 2007  . Diverticulosis   . Internal hemorrhoids   . Adenomatous colon polyp 2011  . Gout   . CRI (chronic renal insufficiency)     creatinine back to normal  . Hypertension   . ED (erectile dysfunction)   . Arthritis   . Anal fistula 2003    Past Surgical History  Procedure Laterality Date  . Treatment fistula anal  2003    rapair     Medications: Current Outpatient Prescriptions  Medication Sig Dispense Refill  . amLODipine-olmesartan (AZOR) 5-40 MG per tablet  Take 1 tablet by mouth daily.    Marland Kitchen aspirin 81 MG tablet Take 81 mg by mouth once a week.    . colchicine 0.6 MG tablet Take 0.6 mg by mouth as needed.     . furosemide (LASIX) 40 MG tablet Take 1 tablet (40 mg total) by mouth daily. 30 tablet 12  . Multiple Vitamin (MULTIVITAMIN) tablet Take 1 tablet by mouth daily.    . potassium chloride SA (K-DUR,KLOR-CON) 20 MEQ tablet Take 2 tablet once daily x 3 days then one tablet daily 34 tablet 12   No current facility-administered medications for this visit.    Allergies: No Known Allergies  Social History: The patient  reports that he has never smoked. He has never used smokeless tobacco. He reports that he does not drink alcohol or use illicit drugs.   Family History: The patient's family history includes Breast cancer in his sister; Clotting disorder in an other family member; Colon polyps in his brother; Diabetes in his father, sister, and sister; Heart failure (age of onset: 73) in his mother; Sickle cell anemia in his brother.   Review of Systems: Please see the history of present illness.   Otherwise, the review of systems is positive for none.   All other systems are reviewed and negative.   Physical  Exam: VS:  BP 112/78 mmHg  Pulse 87  Ht 5\' 11"  (1.803 m)  Wt 158 lb (71.668 kg)  BMI 22.05 kg/m2  SpO2 98% .  BMI Body mass index is 22.05 kg/(m^2).  Wt Readings from Last 3 Encounters:  04/13/15 158 lb (71.668 kg)  04/02/15 165 lb (74.844 kg)  02/17/15 171 lb (77.565 kg)    General: Pleasant. He is tall and quite thin but in no acute distress.  HEENT: Normal. Neck: Supple, no JVD, carotid bruits, or masses noted.  Cardiac: Regular rate and rhythm. No murmurs, rubs, or gallops. No edema.  Respiratory:  Lungs are clear to auscultation bilaterally with normal work of breathing.  GI: Soft and nontender.  MS: No deformity or atrophy. Gait and ROM intact. Skin: Warm and dry. Color is normal.  Neuro:  Strength and sensation are  intact and no gross focal deficits noted.  Psych: Alert, appropriate and with normal affect.   LABORATORY DATA:  EKG:  EKG is not ordered today.   Lab Results  Component Value Date   GLUCOSE 85 04/07/2015   NA 142 04/07/2015   K 3.6 04/07/2015   CL 109 04/07/2015   CREATININE 1.42 04/07/2015   BUN 17 04/07/2015   CO2 27 04/07/2015   TSH 2.14 04/07/2015    BNP (last 3 results) No results for input(s): BNP in the last 8760 hours.  ProBNP (last 3 results)  Recent Labs  04/07/15 1154  PROBNP 1954.0*     Other Studies Reviewed Today:  Echo Study Conclusions from 03/2015  - Left ventricle: The cavity size was severely dilated. Systolic function was severely reduced. The estimated ejection fraction was in the range of 20% to 25%. Wall motion was normal; there were no regional wall motion abnormalities. - Aortic valve: There was mild regurgitation. - Mitral valve: There was mild regurgitation. - Left atrium: The atrium was moderately dilated. - Right atrium: The atrium was mildly dilated.  Notes Recorded by Minus Breeding, MD on 04/07/2015 at 4:32 PM EF is much reduced compared with previous. Please confirm that he is on two ARBs. This will need to be changed. I would like to have him seen in the flex clinic or APP when he comes back in five days for labs. We need to get him off of one of the ARBs (if he is indeed taking two) and get him set up for right and left cath to evaluate his falling EF and increasing dyspnea. We need to know his follow up creat before scheduling a cath. He can start a beta blocker if he does not appear to be volume overloaded when he comes back. Call Mr. Westenskow with the results and send results to Horatio Pel, MD  Assessment/Plan: 1. Worsening systolic function - EF now 20 to 25% - to proceed on with left and right heart catheterization to further define - The patient understands that risks include but are not limited to stroke (1  in 1000), death (1 in 1000), kidney failure [usually temporary] (1 in 500), bleeding (1 in 200), allergic reaction [possibly serious] (1 in 200), and agrees to proceed. I have stopped his Azor - switched to just plain Olmesartan. Adding Coreg 3.125 mg BID. Explained that we will be trying to titrate his medicines to optimize his regimen over the next 3 months and then repeat the echo for possible ICD implant.   2. HTN  3. Weight loss - this may need to be worked up further. He has  listed a "liver lesion" in his history - he does not know any details about this.   Current medicines are reviewed with the patient today.  The patient does not have concerns regarding medicines other than what has been noted above.  The following changes have been made:  See above.  Labs/ tests ordered today include:   No orders of the defined types were placed in this encounter.     Disposition:   Further disposition to follow.    Patient is agreeable to this plan and will call if any problems develop in the interim.   Signed: Burtis Junes, RN, ANP-C 04/13/2015 3:16 PM  [facility] [address] [place], [place]  [address] Phone: [phone] Fax: [phone]

## 2015-04-14 ENCOUNTER — Other Ambulatory Visit: Payer: Self-pay | Admitting: Nurse Practitioner

## 2015-04-16 ENCOUNTER — Telehealth: Payer: Self-pay | Admitting: Nurse Practitioner

## 2015-04-16 ENCOUNTER — Encounter (HOSPITAL_COMMUNITY)
Admission: RE | Disposition: A | Discharge status: Home / Self Care | Payer: Medicare Other | Source: Ambulatory Visit | Attending: Interventional Cardiology

## 2015-04-16 ENCOUNTER — Other Ambulatory Visit: Payer: Medicare Other

## 2015-04-16 ENCOUNTER — Ambulatory Visit (HOSPITAL_COMMUNITY)
Admission: RE | Admit: 2015-04-16 | Discharge: 2015-04-16 | Disposition: A | Discharge status: Home / Self Care | Payer: Medicare Other | Source: Ambulatory Visit | Attending: Interventional Cardiology | Admitting: Interventional Cardiology

## 2015-04-16 ENCOUNTER — Other Ambulatory Visit: Payer: Self-pay | Admitting: Physician Assistant

## 2015-04-16 DIAGNOSIS — I420 Dilated cardiomyopathy: Secondary | ICD-10-CM | POA: Diagnosis present

## 2015-04-16 DIAGNOSIS — I428 Other cardiomyopathies: Secondary | ICD-10-CM | POA: Diagnosis not present

## 2015-04-16 DIAGNOSIS — R6881 Early satiety: Secondary | ICD-10-CM | POA: Diagnosis not present

## 2015-04-16 DIAGNOSIS — Z86010 Personal history of colonic polyps: Secondary | ICD-10-CM | POA: Diagnosis not present

## 2015-04-16 DIAGNOSIS — R634 Abnormal weight loss: Secondary | ICD-10-CM | POA: Insufficient documentation

## 2015-04-16 DIAGNOSIS — M109 Gout, unspecified: Secondary | ICD-10-CM | POA: Diagnosis not present

## 2015-04-16 DIAGNOSIS — K648 Other hemorrhoids: Secondary | ICD-10-CM | POA: Insufficient documentation

## 2015-04-16 DIAGNOSIS — Z6822 Body mass index (BMI) 22.0-22.9, adult: Secondary | ICD-10-CM | POA: Insufficient documentation

## 2015-04-16 DIAGNOSIS — I129 Hypertensive chronic kidney disease with stage 1 through stage 4 chronic kidney disease, or unspecified chronic kidney disease: Secondary | ICD-10-CM | POA: Diagnosis not present

## 2015-04-16 DIAGNOSIS — Z7982 Long term (current) use of aspirin: Secondary | ICD-10-CM | POA: Diagnosis not present

## 2015-04-16 DIAGNOSIS — R5383 Other fatigue: Secondary | ICD-10-CM | POA: Insufficient documentation

## 2015-04-16 DIAGNOSIS — I2510 Atherosclerotic heart disease of native coronary artery without angina pectoris: Secondary | ICD-10-CM | POA: Insufficient documentation

## 2015-04-16 DIAGNOSIS — R0600 Dyspnea, unspecified: Secondary | ICD-10-CM | POA: Diagnosis present

## 2015-04-16 DIAGNOSIS — N529 Male erectile dysfunction, unspecified: Secondary | ICD-10-CM | POA: Diagnosis not present

## 2015-04-16 DIAGNOSIS — I272 Other secondary pulmonary hypertension: Secondary | ICD-10-CM | POA: Diagnosis not present

## 2015-04-16 DIAGNOSIS — I5022 Chronic systolic (congestive) heart failure: Secondary | ICD-10-CM | POA: Insufficient documentation

## 2015-04-16 DIAGNOSIS — N183 Chronic kidney disease, stage 3 unspecified: Secondary | ICD-10-CM

## 2015-04-16 DIAGNOSIS — N189 Chronic kidney disease, unspecified: Secondary | ICD-10-CM | POA: Insufficient documentation

## 2015-04-16 HISTORY — PX: CARDIAC CATHETERIZATION: SHX172

## 2015-04-16 LAB — POCT I-STAT 3, ART BLOOD GAS (G3+)
Acid-base deficit: 3 mmol/L — ABNORMAL HIGH (ref 0.0–2.0)
Bicarbonate: 22.9 mEq/L (ref 20.0–24.0)
O2 Saturation: 75 %
PCO2 ART: 41.7 mmHg (ref 35.0–45.0)
PH ART: 7.346 — AB (ref 7.350–7.450)
TCO2: 24 mmol/L (ref 0–100)
pO2, Arterial: 42 mmHg — ABNORMAL LOW (ref 80.0–100.0)

## 2015-04-16 LAB — BASIC METABOLIC PANEL
Anion gap: 7 (ref 5–15)
BUN: 23 mg/dL — AB (ref 6–20)
CALCIUM: 9.3 mg/dL (ref 8.9–10.3)
CO2: 27 mmol/L (ref 22–32)
Chloride: 108 mmol/L (ref 101–111)
Creatinine, Ser: 1.67 mg/dL — ABNORMAL HIGH (ref 0.61–1.24)
GFR calc Af Amer: 45 mL/min — ABNORMAL LOW (ref >60)
GFR, EST NON AFRICAN AMERICAN: 39 mL/min — AB (ref >60)
Glucose, Bld: 96 mg/dL (ref 65–99)
Potassium: 4.6 mmol/L (ref 3.5–5.1)
SODIUM: 142 mmol/L (ref 135–145)

## 2015-04-16 LAB — POCT I-STAT 3, VENOUS BLOOD GAS (G3P V)
Acid-base deficit: 3 mmol/L — ABNORMAL HIGH (ref 0.0–2.0)
Bicarbonate: 22.6 mEq/L (ref 20.0–24.0)
O2 SAT: 97 %
TCO2: 24 mmol/L (ref 0–100)
pCO2, Ven: 39.2 mmHg — ABNORMAL LOW (ref 45.0–50.0)
pH, Ven: 7.368 — ABNORMAL HIGH (ref 7.250–7.300)
pO2, Ven: 90 mmHg — ABNORMAL HIGH (ref 30.0–45.0)

## 2015-04-16 SURGERY — RIGHT/LEFT HEART CATH AND CORONARY ANGIOGRAPHY
Anesthesia: LOCAL

## 2015-04-16 MED ORDER — SODIUM CHLORIDE 0.9 % IJ SOLN: 3.0000 mL | Freq: Two times a day (BID) | INTRAMUSCULAR | Status: DC

## 2015-04-16 MED ORDER — HEPARIN SODIUM (PORCINE) 1000 UNIT/ML IJ SOLN
INTRAMUSCULAR | Status: AC
  Filled 2015-04-16: qty 1

## 2015-04-16 MED ORDER — IOHEXOL 350 MG/ML SOLN
INTRAVENOUS | Status: DC | PRN
  Administered 2015-04-16: 60 mL via INTRA_ARTERIAL

## 2015-04-16 MED ORDER — MIDAZOLAM HCL 2 MG/2ML IJ SOLN
INTRAMUSCULAR | Status: DC | PRN
  Administered 2015-04-16: 2 mg via INTRAVENOUS

## 2015-04-16 MED ORDER — VERAPAMIL HCL 2.5 MG/ML IV SOLN
INTRAVENOUS | Status: AC
  Filled 2015-04-16: qty 2

## 2015-04-16 MED ORDER — ASPIRIN 81 MG PO CHEW: 81.0000 mg | CHEWABLE_TABLET | ORAL | Status: DC

## 2015-04-16 MED ORDER — LIDOCAINE HCL (PF) 1 % IJ SOLN
INTRAMUSCULAR | Status: AC
  Filled 2015-04-16: qty 30

## 2015-04-16 MED ORDER — SODIUM CHLORIDE 0.9 % IV SOLN
INTRAVENOUS | Status: DC
  Administered 2015-04-16: 09:00:00 via INTRAVENOUS

## 2015-04-16 MED ORDER — SODIUM CHLORIDE 0.9 % IV SOLN: INTRAVENOUS | Status: DC

## 2015-04-16 MED ORDER — SODIUM CHLORIDE 0.9 % IJ SOLN: 3.0000 mL | INTRAMUSCULAR | Status: DC | PRN

## 2015-04-16 MED ORDER — FENTANYL CITRATE (PF) 100 MCG/2ML IJ SOLN
INTRAMUSCULAR | Status: DC | PRN
  Administered 2015-04-16: 50 ug via INTRAVENOUS

## 2015-04-16 MED ORDER — SODIUM CHLORIDE 0.9 % IV SOLN
INTRAVENOUS | Status: AC
  Administered 2015-04-16: 11:00:00 via INTRAVENOUS

## 2015-04-16 MED ORDER — SODIUM CHLORIDE 0.9 % IV SOLN: 250.0000 mL | INTRAVENOUS | Status: DC | PRN

## 2015-04-16 MED ORDER — SODIUM CHLORIDE 0.9 % IJ SOLN
INTRAMUSCULAR | Status: DC | PRN
  Administered 2015-04-16: 13:00:00 via INTRA_ARTERIAL

## 2015-04-16 MED ORDER — HEPARIN SODIUM (PORCINE) 1000 UNIT/ML IJ SOLN
INTRAMUSCULAR | Status: DC | PRN
  Administered 2015-04-16: 3500 [IU] via INTRAVENOUS

## 2015-04-16 MED ORDER — ACETAMINOPHEN 325 MG PO TABS: 650.0000 mg | ORAL_TABLET | ORAL | Status: DC | PRN

## 2015-04-16 MED ORDER — MIDAZOLAM HCL 2 MG/2ML IJ SOLN
INTRAMUSCULAR | Status: AC
  Filled 2015-04-16: qty 2

## 2015-04-16 MED ORDER — ONDANSETRON HCL 4 MG/2ML IJ SOLN: 4.0000 mg | Freq: Four times a day (QID) | INTRAMUSCULAR | Status: DC | PRN

## 2015-04-16 MED ORDER — HEPARIN (PORCINE) IN NACL 2-0.9 UNIT/ML-% IJ SOLN
INTRAMUSCULAR | Status: AC
  Filled 2015-04-16: qty 1500

## 2015-04-16 MED ORDER — DIAZEPAM 5 MG PO TABS: 5.0000 mg | ORAL_TABLET | ORAL | Status: DC

## 2015-04-16 MED ORDER — FENTANYL CITRATE (PF) 100 MCG/2ML IJ SOLN
INTRAMUSCULAR | Status: AC
  Filled 2015-04-16: qty 2

## 2015-04-16 MED ORDER — ASPIRIN 81 MG PO CHEW
CHEWABLE_TABLET | ORAL | Status: AC
  Administered 2015-04-16: 81 mg
  Filled 2015-04-16: qty 1

## 2015-04-16 MED ORDER — LIDOCAINE HCL (PF) 1 % IJ SOLN
INTRAMUSCULAR | Status: DC | PRN
  Administered 2015-04-16: 5 mL via INTRADERMAL

## 2015-04-16 MED ORDER — DIAZEPAM 5 MG PO TABS
ORAL_TABLET | ORAL | Status: AC
  Administered 2015-04-16: 5 mg
  Filled 2015-04-16: qty 1

## 2015-04-16 MED ORDER — NITROGLYCERIN 1 MG/10 ML FOR IR/CATH LAB
INTRA_ARTERIAL | Status: AC
  Filled 2015-04-16: qty 10

## 2015-04-16 SURGICAL SUPPLY — 14 items
CATH BALLN WEDGE 5F 110CM (CATHETERS) ×1 IMPLANT
CATH INFINITI 5 FR JL3.5 (CATHETERS) ×2 IMPLANT
CATH INFINITI 5FR JL4 (CATHETERS) ×1 IMPLANT
CATH INFINITI JR4 5F (CATHETERS) ×2 IMPLANT
DEVICE RAD COMP TR BAND LRG (VASCULAR PRODUCTS) ×2 IMPLANT
GLIDESHEATH SLEND A-KIT 6F 22G (SHEATH) ×2 IMPLANT
KIT HEART LEFT (KITS) ×3 IMPLANT
KIT HEART RIGHT NAMIC (KITS) ×2 IMPLANT
PACK CARDIAC CATHETERIZATION (CUSTOM PROCEDURE TRAY) ×2 IMPLANT
SHEATH FAST CATH BRACH 5F 5CM (SHEATH) ×1 IMPLANT
SLEEVE REPOSITIONING LENGTH 30 (MISCELLANEOUS) ×1 IMPLANT
TRANSDUCER W/STOPCOCK (MISCELLANEOUS) ×2 IMPLANT
TUBING CIL FLEX 10 FLL-RA (TUBING) ×2 IMPLANT
WIRE SAFE-T 1.5MM-J .035X260CM (WIRE) ×2 IMPLANT

## 2015-04-16 NOTE — Interval H&P Note (Signed)
Cath Lab Visit (complete for each Cath Lab visit)  Clinical Evaluation Leading to the Procedure:   ACS: No.  Non-ACS:    Anginal Classification: No Symptoms  Anti-ischemic medical therapy: Minimal Therapy (1 class of medications)  Non-Invasive Test Results: Intermediate-risk stress test findings: cardiac mortality 1-3%/year  Prior CABG: No previous CABG      History and Physical Interval Note:  04/16/2015 12:19 PM  Miguel Roach  has presented today for surgery, with the diagnosis of NEW LV disfunction  The various methods of treatment have been discussed with the patient and family. After consideration of risks, benefits and other options for treatment, the patient has consented to  Procedure(s): Right/Left Heart Cath and Coronary Angiography (N/A) as a surgical intervention .  The patient's history has been reviewed, patient examined, no change in status, stable for surgery.  I have reviewed the patient's chart and labs.  Questions were answered to the patient's satisfaction.     Sinclair Grooms

## 2015-04-16 NOTE — Discharge Instructions (Signed)
Venogram, Care After °Refer to this sheet in the next few weeks. These instructions provide you with information on caring for yourself after your procedure. Your health care provider may also give you more specific instructions. Your treatment has been planned according to current medical practices, but problems sometimes occur. Call your health care provider if you have any problems or questions after your procedure. °WHAT TO EXPECT AFTER THE PROCEDURE °After your procedure, it is typical to have the following sensations: °· Mild discomfort at the catheter insertion site. °HOME CARE INSTRUCTIONS  °· Take all medicines exactly as directed. °· Follow any prescribed diet. °· Follow instructions regarding both rest and physical activity. °· Drink more fluids for the first several days after the procedure in order to help flush dye from your kidneys. °SEEK MEDICAL CARE IF: °· You develop a rash. °· You have fever not controlled by medicine. °SEEK IMMEDIATE MEDICAL CARE IF: °· There is pain, drainage, bleeding, redness, swelling, warmth or a red streak at the site of the IV tube. °· The extremity where your IV tube was placed becomes discolored, numb, or cool. °· You have difficulty breathing or shortness of breath. °· You develop chest pain. °· You have excessive dizziness or fainting. °Document Released: 07/23/2013 Document Revised: 10/07/2013 Document Reviewed: 07/23/2013 °ExitCare® Patient Information ©2015 [employer]. This information is not intended to replace advice given to you by your health care provider. Make sure you discuss any questions you have with your health care provider. °Radial Site Care °Refer to this sheet in the next few weeks. These instructions provide you with information on caring for yourself after your procedure. Your caregiver may also give you more specific instructions. Your treatment has been planned according to current medical practices, but problems sometimes occur. Call your  caregiver if you have any problems or questions after your procedure. °HOME CARE INSTRUCTIONS °· You may shower the day after the procedure. Remove the bandage (dressing) and gently wash the site with plain soap and water. Gently pat the site dry. °· Do not apply powder or lotion to the site. °· Do not submerge the affected site in water for 3 to 5 days. °· Inspect the site at least twice daily. °· Do not flex or bend the affected arm for 24 hours. °· No lifting over 5 pounds (2.3 kg) for 5 days after your procedure. °· Do not drive home if you are discharged the same day of the procedure. Have someone else drive you. °· You may drive 24 hours after the procedure unless otherwise instructed by your caregiver. °· Do not operate machinery or power tools for 24 hours. °· A responsible adult should be with you for the first 24 hours after you arrive home. °What to expect: °· Any bruising will usually fade within 1 to 2 weeks. °· Blood that collects in the tissue (hematoma) may be painful to the touch. It should usually decrease in size and tenderness within 1 to 2 weeks. °SEEK IMMEDIATE MEDICAL CARE IF: °· You have unusual pain at the radial site. °· You have redness, warmth, swelling, or pain at the radial site. °· You have drainage (other than a small amount of blood on the dressing). °· You have chills. °· You have a fever or persistent symptoms for more than 72 hours. °· You have a fever and your symptoms suddenly get worse. °· Your arm becomes pale, cool, tingly, or numb. °· You have heavy bleeding from the site. Hold pressure on the site. °  Document Released: 11/04/2010 Document Revised: 12/25/2011 Document Reviewed: 11/04/2010 °ExitCare® Patient Information ©2015 [employer]. This information is not intended to replace advice given to you by your health care provider. Make sure you discuss any questions you have with your health care provider. ° °

## 2015-04-16 NOTE — H&P (View-Only) (Signed)
CARDIOLOGY OFFICE NOTE  Date:  04/13/2015    Miguel Roach Date of Birth: [birth year] Medical Record [id number]  PCP:  Miguel Pel, MD  Cardiologist:  Miguel Roach    Chief Complaint  Patient presents with  . Pre cath visit - seen for Miguel Roach.    Seen for Miguel Roach.     History of Present Illness: Miguel Roach is a 74 y.o. male who presents today for a post echo visit. Seen for Miguel Roach. He was seen in 2010 by another cardiologist - Miguel Roach. He was noted at that time to have an ejection fraction of about 40-45%. This was reported in his primary care notes. There is also mention of a negative stress test. However, the patient did not go back for follow-up.   Referred back from primary care with exertional fatigue, ectopics on EKG and dyspnea. Echo ordered - see below - EF now at 20 to 25%. Lasix was started. He was on 2 ARBs - this has been addressed. He has lost a considerable amount of weight over the past 6 months that has NOT been intentional.  Comes in today. Here with his wife - Miguel Roach. He is doing ok. Continues with exertional fatigue. Has not had chest pain/discomfort. No swelling. Early satiety. Has lost weight. No palpitations noted but says he has always had an irregular heart beat. He is not dizzy or lightheaded. No syncope. No cough.   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   Past Medical History  Diagnosis Date  . Liver lesion 2007  . Renal cyst 2007  . Diverticulosis   . Internal hemorrhoids   . Adenomatous colon polyp 2011  . Gout   . CRI (chronic renal insufficiency)     creatinine back to normal  . Hypertension   . ED (erectile dysfunction)   . Arthritis   . Anal fistula 2003    Past Surgical History  Procedure Laterality Date  . Treatment fistula anal  2003    rapair     Medications: Current Outpatient Prescriptions  Medication Sig Dispense Refill  . amLODipine-olmesartan (AZOR) 5-40 MG per tablet  Take 1 tablet by mouth daily.    Marland Kitchen aspirin 81 MG tablet Take 81 mg by mouth once a week.    . colchicine 0.6 MG tablet Take 0.6 mg by mouth as needed.     . furosemide (LASIX) 40 MG tablet Take 1 tablet (40 mg total) by mouth daily. 30 tablet 12  . Multiple Vitamin (MULTIVITAMIN) tablet Take 1 tablet by mouth daily.    . potassium chloride SA (K-DUR,KLOR-CON) 20 MEQ tablet Take 2 tablet once daily x 3 days then one tablet daily 34 tablet 12   No current facility-administered medications for this visit.    Allergies: No Known Allergies  Social History: The patient  reports that he has never smoked. He has never used smokeless tobacco. He reports that he does not drink alcohol or use illicit drugs.   Family History: The patient's family history includes Breast cancer in his sister; Clotting disorder in an other family member; Colon polyps in his brother; Diabetes in his father, sister, and sister; Heart failure (age of onset: 38) in his mother; Sickle cell anemia in his brother.   Review of Systems: Please see the history of present illness.   Otherwise, the review of systems is positive for none.   All other systems are reviewed and negative.   Physical  Exam: VS:  BP 112/78 mmHg  Pulse 87  Ht 5\' 11"  (1.803 m)  Wt 158 lb (71.668 kg)  BMI 22.05 kg/m2  SpO2 98% .  BMI Body mass index is 22.05 kg/(m^2).  Wt Readings from Last 3 Encounters:  04/13/15 158 lb (71.668 kg)  04/02/15 165 lb (74.844 kg)  02/17/15 171 lb (77.565 kg)    General: Pleasant. He is tall and quite thin but in no acute distress.  HEENT: Normal. Neck: Supple, no JVD, carotid bruits, or masses noted.  Cardiac: Regular rate and rhythm. No murmurs, rubs, or gallops. No edema.  Respiratory:  Lungs are clear to auscultation bilaterally with normal work of breathing.  GI: Soft and nontender.  MS: No deformity or atrophy. Gait and ROM intact. Skin: Warm and dry. Color is normal.  Neuro:  Strength and sensation are  intact and no gross focal deficits noted.  Psych: Alert, appropriate and with normal affect.   LABORATORY DATA:  EKG:  EKG is not ordered today.   Lab Results  Component Value Date   GLUCOSE 85 04/07/2015   NA 142 04/07/2015   K 3.6 04/07/2015   CL 109 04/07/2015   CREATININE 1.42 04/07/2015   BUN 17 04/07/2015   CO2 27 04/07/2015   TSH 2.14 04/07/2015    BNP (last 3 results) No results for input(s): BNP in the last 8760 hours.  ProBNP (last 3 results)  Recent Labs  04/07/15 1154  PROBNP 1954.0*     Other Studies Reviewed Today:  Echo Study Conclusions from 03/2015  - Left ventricle: The cavity size was severely dilated. Systolic function was severely reduced. The estimated ejection fraction was in the range of 20% to 25%. Wall motion was normal; there were no regional wall motion abnormalities. - Aortic valve: There was mild regurgitation. - Mitral valve: There was mild regurgitation. - Left atrium: The atrium was moderately dilated. - Right atrium: The atrium was mildly dilated.  Notes Recorded by Miguel Breeding, MD on 04/07/2015 at 4:32 PM EF is much reduced compared with previous. Please confirm that he is on two ARBs. This will need to be changed. I would like to have him seen in the flex clinic or APP when he comes back in five days for labs. We need to get him off of one of the ARBs (if he is indeed taking two) and get him set up for right and left cath to evaluate his falling EF and increasing dyspnea. We need to know his follow up creat before scheduling a cath. He can start a beta blocker if he does not appear to be volume overloaded when he comes back. Call Miguel Roach with the results and send results to Miguel Pel, MD  Assessment/Plan: 1. Worsening systolic function - EF now 20 to 25% - to proceed on with left and right heart catheterization to further define - The patient understands that risks include but are not limited to stroke (1  in 1000), death (1 in 1000), kidney failure [usually temporary] (1 in 500), bleeding (1 in 200), allergic reaction [possibly serious] (1 in 200), and agrees to proceed. I have stopped his Azor - switched to just plain Olmesartan. Adding Coreg 3.125 mg BID. Explained that we will be trying to titrate his medicines to optimize his regimen over the next 3 months and then repeat the echo for possible ICD implant.   2. HTN  3. Weight loss - this may need to be worked up further. He has  listed a "liver lesion" in his history - he does not know any details about this.   Current medicines are reviewed with the patient today.  The patient does not have concerns regarding medicines other than what has been noted above.  The following changes have been made:  See above.  Labs/ tests ordered today include:   No orders of the defined types were placed in this encounter.     Disposition:   Further disposition to follow.    Patient is agreeable to this plan and will call if any problems develop in the interim.   Signed: Burtis Junes, RN, ANP-C 04/13/2015 3:16 PM  [facility] [address] [place], [place]  [address] Phone: [phone] Fax: [phone]

## 2015-04-16 NOTE — Telephone Encounter (Signed)
TCM per [facility] 7/11 @ 9:30 w/ Cecille Rubin

## 2015-04-20 ENCOUNTER — Encounter (HOSPITAL_COMMUNITY): Payer: Self-pay | Admitting: Interventional Cardiology

## 2015-04-20 ENCOUNTER — Other Ambulatory Visit (HOSPITAL_COMMUNITY): Payer: Medicare Other

## 2015-04-20 NOTE — Telephone Encounter (Signed)
Patient contacted regarding discharge from 04/16/15 on [facility]  .  Patient understands to follow up with provider Truitt Merle NP  on 04/26/15  at [place] at [facility]. Patient understands discharge instructions? yes Patient understands medications and regiment? yes Patient understands to bring all medications to this visit? yes  No further concerns or questions verbalized from the pt.

## 2015-04-20 NOTE — Telephone Encounter (Signed)
TCM call-  Left message to call back.  This is a Dr Percival Spanish pt at [facility].  Will defer this message to follow-up with the pt from [facility] office.

## 2015-04-21 MED FILL — Nitroglycerin IV Soln 100 MCG/ML in D5W: INTRA_ARTERIAL | Qty: 10 | Status: AC

## 2015-04-21 MED FILL — Heparin Sodium (Porcine) 2 Unit/ML in Sodium Chloride 0.9%: INTRAMUSCULAR | Qty: 1500 | Status: AC

## 2015-04-23 ENCOUNTER — Other Ambulatory Visit (INDEPENDENT_AMBULATORY_CARE_PROVIDER_SITE_OTHER): Payer: Medicare Other | Admitting: *Deleted

## 2015-04-23 DIAGNOSIS — N183 Chronic kidney disease, stage 3 unspecified: Secondary | ICD-10-CM

## 2015-04-23 LAB — BASIC METABOLIC PANEL
BUN: 22 mg/dL (ref 6–23)
CALCIUM: 9.3 mg/dL (ref 8.4–10.5)
CHLORIDE: 109 meq/L (ref 96–112)
CO2: 28 meq/L (ref 19–32)
Creatinine, Ser: 1.72 mg/dL — ABNORMAL HIGH (ref 0.40–1.50)
GFR: 50.31 mL/min — ABNORMAL LOW (ref >60.00)
GLUCOSE: 83 mg/dL (ref 70–99)
Potassium: 4.1 mEq/L (ref 3.5–5.1)
Sodium: 143 mEq/L (ref 135–145)

## 2015-04-26 ENCOUNTER — Ambulatory Visit (INDEPENDENT_AMBULATORY_CARE_PROVIDER_SITE_OTHER): Payer: Medicare Other | Admitting: Nurse Practitioner

## 2015-04-26 ENCOUNTER — Encounter: Payer: Self-pay | Admitting: Nurse Practitioner

## 2015-04-26 VITALS — BP 110/78 | HR 78 | Ht 71.0 in | Wt 162.0 lb

## 2015-04-26 DIAGNOSIS — G4730 Sleep apnea, unspecified: Secondary | ICD-10-CM

## 2015-04-26 DIAGNOSIS — Z9889 Other specified postprocedural states: Secondary | ICD-10-CM

## 2015-04-26 DIAGNOSIS — I420 Dilated cardiomyopathy: Secondary | ICD-10-CM

## 2015-04-26 DIAGNOSIS — I429 Cardiomyopathy, unspecified: Secondary | ICD-10-CM

## 2015-04-26 DIAGNOSIS — I428 Other cardiomyopathies: Secondary | ICD-10-CM

## 2015-04-26 MED ORDER — FUROSEMIDE 40 MG PO TABS: 20.0000 mg | ORAL_TABLET | Freq: Every day | ORAL | Status: DC

## 2015-04-26 MED ORDER — CARVEDILOL 6.25 MG PO TABS: 6.2500 mg | ORAL_TABLET | Freq: Two times a day (BID) | ORAL | Status: DC

## 2015-04-26 NOTE — Patient Instructions (Addendum)
We will be checking the following labs today - BMET   Medication Instructions:    Continue with your current medicines but   I am cutting the Lasix back to just 20 mg (half) pill  Increase the Coreg to 6.25 mg twice a day - you can take 2 of your 3.125 mg tabs twice a day to use up and I have sent the RX for the 6.25 to the drug store.    Testing/Procedures To Be Arranged:  Sleep Study  Follow-Up:   Referral to CHF clinic  See Dr. Percival Spanish as planned    Other Special Instructions:   N/A  Call the [place] office at [phone] if you have any questions, problems or concerns.

## 2015-04-26 NOTE — Progress Notes (Signed)
CARDIOLOGY OFFICE NOTE  Date:  04/26/2015    Miguel Roach Date of Birth: May 27, 1941 Medical Record [id number]  PCP:  Miguel Pel, MD  Cardiologist:  Miguel Roach    Chief Complaint  Patient presents with  . Post cardiac cath visit    Seen for Dr. Percival Roach    History of Present Illness: Miguel Roach is a 74 y.o. male who presents today for a post cath visit. Seen for Dr. Percival Roach. He was seen in 2010 by another cardiologist - Dr. Elisabeth Roach. He was noted at that time to have an ejection fraction of about 40-45%. This was reported in his primary care notes. There is also mention of a negative stress test. However, the patient did not go back for follow-up.   Referred back from primary care with exertional fatigue, ectopics on EKG and dyspnea and was seen by Dr. Percival Roach. Echo ordered - see below - EF now at 20 to 25%. Lasix was started. He was on 2 ARBs - this has been addressed. He has lost a considerable amount of weight over the past 6 months that has NOT been intentional.  I saw him just a few weeks ago for a pre cath visit. Having exertional fatigue. Early satiety. Losing weight.   Cath performed - nonobstructive disease noted with moderate pulmonary HTN.   Comes in today. Here with his wife - Miguel Roach. He is doing well. Actually feeling a little better. Less dyspnea. More energy. Not dizzy or lightheaded. Feels ok on his medicines. Wife notes that he still has some breathing issues - will have rapid irregular breathing at night with periods of apnea - has never had sleep study.   Past Medical History  Diagnosis Date  . Liver lesion 2007  . Renal cyst 2007  . Diverticulosis   . Internal hemorrhoids   . Adenomatous colon polyp 2011  . Gout   . CRI (chronic renal insufficiency)     creatinine back to normal  . Hypertension   . ED (erectile dysfunction)   . Arthritis   . Anal fistula 2003    Past Surgical History  Procedure Laterality Date  . Treatment  fistula anal  2003    rapair  . Cardiac catheterization N/A 04/16/2015    Procedure: Right/Left Heart Cath and Coronary Angiography;  Surgeon: Miguel Crome, MD;  Location: [facility];  Service: Cardiovascular;  Laterality: N/A;     Medications: Current Outpatient Prescriptions  Medication Sig Dispense Refill  . aspirin 81 MG tablet Take 81 mg by mouth once a week.    . carvedilol (COREG) 6.25 MG tablet Take 1 tablet (6.25 mg total) by mouth 2 (two) times daily. 60 tablet 3  . colchicine 0.6 MG tablet Take 0.6 mg by mouth as needed.     . furosemide (LASIX) 40 MG tablet Take 0.5 tablets (20 mg total) by mouth daily. 30 tablet 12  . olmesartan (BENICAR) 40 MG tablet Take 1 tablet (40 mg total) by mouth daily. 30 tablet 6  . potassium chloride SA (K-DUR,KLOR-CON) 20 MEQ tablet Take 2 tablet once daily x 3 days then one tablet daily 34 tablet 12   No current facility-administered medications for this visit.    Allergies: No Known Allergies  Social History: The patient  reports that he has never smoked. He has never used smokeless tobacco. He reports that he does not drink alcohol or use illicit drugs.   Family History: The patient's family history includes  Breast cancer in his sister; Clotting disorder in an other family member; Colon polyps in his brother; Diabetes in his father, sister, and sister; Heart failure (age of onset: 86) in his mother; Sickle cell anemia in his brother.   Review of Systems: Please see the history of present illness.   Otherwise, the review of systems is positive for none.   All other systems are reviewed and negative.   Physical Exam: VS:  BP 110/78 mmHg  Pulse 78  Ht 5\' 11"  (1.803 m)  Wt 162 lb (73.483 kg)  BMI 22.60 kg/m2  SpO2 97% .  BMI Body mass index is 22.6 kg/(m^2).  Wt Readings from Last 3 Encounters:  04/26/15 162 lb (73.483 kg)  04/16/15 220 lb (99.791 kg)  04/13/15 158 lb (71.668 kg)    General: Pleasant. Well developed, well  nourished and in no acute distress.  HEENT: Normal. Neck: Supple, no JVD, carotid bruits, or masses noted.  Cardiac: Regular rate and rhythm. No murmurs, rubs, or gallops. No edema.  Respiratory:  Lungs are clear to auscultation bilaterally with normal work of breathing.  GI: Soft and nontender.  MS: No deformity or atrophy. Gait and ROM intact. Skin: Warm and dry. Color is normal.  Neuro:  Strength and sensation are intact and no gross focal deficits noted.  Psych: Alert, appropriate and with normal affect.   LABORATORY DATA:  EKG:  EKG is not ordered today.   Lab Results  Component Value Date   WBC 9.0 04/13/2015   HGB 13.9 04/13/2015   HCT 43.9 04/13/2015   PLT 208.0 04/13/2015   GLUCOSE 83 04/23/2015   NA 143 04/23/2015   K 4.1 04/23/2015   CL 109 04/23/2015   CREATININE 1.72* 04/23/2015   BUN 22 04/23/2015   CO2 28 04/23/2015   TSH 2.14 04/07/2015   INR 1.1* 04/13/2015    BNP (last 3 results) No results for input(s): BNP in the last 8760 hours.  ProBNP (last 3 results)  Recent Labs  04/07/15 1154  PROBNP 1954.0*     Other Studies Reviewed Today:   Conclusion    1. 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 2. Prox Cx to Dist Cx lesion, 35% stenosed. 3. Moderate pulmonary hypertension 4. Known severe left ventricular systolic dysfunction with ejection fraction less than 35% based upon recent echo   Widely patent coronary arteries. Irregularities are noted in the mid and distal right coronary and in the circumflex coronary artery.  Moderate pulmonary hypertension with PA pressures measuring 47/18 mmHg  Dilated and severely hypocontractile left ventricle with an estimated ejection fraction of 20-25% by recent echocardiography    Recommendations:  Heart failure therapy and consider referral to the advanced heart failure team .    Echo Study Conclusions from 03/2015  - Left ventricle: The cavity size was severely dilated. Systolic function was severely  reduced. The estimated ejection fraction was in the range of 20% to 25%. Wall motion was normal; there were no regional wall motion abnormalities. - Aortic valve: There was mild regurgitation. - Mitral valve: There was mild regurgitation. - Left atrium: The atrium was moderately dilated. - Right atrium: The atrium was mildly dilated.  Notes Recorded by Minus Breeding, MD on 04/07/2015 at 4:32 PM EF is much reduced compared with previous. Please confirm that he is on two ARBs. This will need to be changed. I would like to have him seen in the flex clinic or APP when he comes back in five days for labs. We  need to get him off of one of the ARBs (if he is indeed taking two) and get him set up for right and left cath to evaluate his falling EF and increasing dyspnea. We need to know his follow up creat before scheduling a cath. He can start a beta blocker if he does not appear to be volume overloaded when he comes back. Call Mr. Wesby with the results and send results to Miguel Pel, MD  Assessment/Plan: 1. Worsening systolic function - EF now 20 to 25% - post cath - nonobstructive disease noted by cath - arranging for CHF follow up. Coreg increased today to 6.25 mg BID. Cutting Lasix back to 20 mg. Recheck BMET. Consider ICD referral if EF does not improve. Arrange sleep study for possible OSA.  2. HTN - BP ok on current regimen. Still have a little room to titrate up his medicines.   3. Weight loss - this may need to be worked up further. He has listed a "liver lesion" in his history - he does not know any details about this.    Current medicines are reviewed with the patient today.  The patient does not have concerns regarding medicines other than what has been noted above.  The following changes have been made:  See above.  Labs/ tests ordered today include:    Orders Placed This Encounter  Procedures  . Basic metabolic panel  . Ambulatory referral to Cardiology      Disposition:   FU with Dr. Percival Roach as planned. Referring to CHF clinic as well.   Patient is agreeable to this plan and will call if any problems develop in the interim.   Signed: Burtis Junes, RN, ANP-C 04/26/2015 9:57 AM  [facility] [address] [place], [place]  [address] Phone: [phone] Fax: [phone]

## 2015-04-28 ENCOUNTER — Encounter: Payer: Self-pay | Admitting: Cardiology

## 2015-04-28 ENCOUNTER — Ambulatory Visit (INDEPENDENT_AMBULATORY_CARE_PROVIDER_SITE_OTHER): Payer: Medicare Other | Admitting: Cardiology

## 2015-04-28 VITALS — BP 102/70 | HR 70 | Ht 71.0 in | Wt 161.0 lb

## 2015-04-28 DIAGNOSIS — I420 Dilated cardiomyopathy: Secondary | ICD-10-CM | POA: Diagnosis not present

## 2015-04-28 NOTE — Progress Notes (Signed)
Cardiology Office Note   Date:  04/28/2015   ID:  Miguel Roach, DOB 08-10-1941, MRN [id number]  PCP:  Miguel Pel, MD  Cardiologist:   Miguel Breeding, MD   Chief Complaint  Patient presents with  . Cardiomyopathy      History of Present Illness: [name] is a 74 y.o. male who presents for evaluation of dyspnea. He was seen in 2010 by another cardiologist. He was noted at that time to have an ejection fraction of about 40-45%. I don't see the echo report but do see this reported in his primary care notes. There is also mention of a negative stress test. However, the patient did not go back for follow-up. He had no further problems.  He was having some shortness of breath at that time.   He came to see me recently with increased SOB.  Repeat echo showed that his ejection fraction was down to 25%. He had a cardiac catheterization demonstrating nonobstructive coronary disease. He had some moderate pulmonary hypertension. He is now having his meds titrated.  He just saw Mrs. Miguel Roach  2 days ago and had his Coreg increased.   He started this and has done okay with it. He thinks he's feeling better than when he first came back the clinic. He has less breathlessness. He seems to be class II. He's not having any PND or orthopnea though he does sleep with his head up a little bit. He's not having any edema. He has a little more energy. He's not having any presyncope or syncope. He denies any chest pressure, neck or arm discomfort.   Past Medical History  Diagnosis Date  . Liver lesion 2007  . Renal cyst 2007  . Diverticulosis   . Internal hemorrhoids   . Adenomatous colon polyp 2011  . Gout   . CRI (chronic renal insufficiency)     creatinine back to normal  . Hypertension   . ED (erectile dysfunction)   . Arthritis   . Anal fistula 2003    Past Surgical History  Procedure Laterality Date  . Treatment fistula anal  2003    rapair  . Cardiac catheterization N/A  04/16/2015    Procedure: Right/Left Heart Cath and Coronary Angiography;  Surgeon: Miguel Crome, MD;  Location: [facility];  Service: Cardiovascular;  Laterality: N/A;     Current Outpatient Prescriptions  Medication Sig Dispense Refill  . aspirin 81 MG tablet Take 81 mg by mouth 2 (two) times a week.     . carvedilol (COREG) 6.25 MG tablet Take 1 tablet (6.25 mg total) by mouth 2 (two) times daily. 60 tablet 3  . colchicine 0.6 MG tablet Take 0.6 mg by mouth as needed.     . furosemide (LASIX) 40 MG tablet Take 0.5 tablets (20 mg total) by mouth daily. 30 tablet 12  . olmesartan (BENICAR) 40 MG tablet Take 1 tablet (40 mg total) by mouth daily. 30 tablet 6  . potassium chloride SA (K-DUR,KLOR-CON) 20 MEQ tablet Take 2 tablet once daily x 3 days then one tablet daily 34 tablet 12   No current facility-administered medications for this visit.    Allergies:   Review of patient's allergies indicates no known allergies.    ROS:  Please see the history of present illness.   Otherwise, review of systems are positive for constipation, mild cough with mucus.   All other systems are reviewed and negative.    PHYSICAL EXAM: VS:  BP 102/70 mmHg  Pulse 70  Ht 5\' 11"  (1.803 m)  Wt 161 lb (73.029 kg)  BMI 22.46 kg/m2 , BMI Body mass index is 22.46 kg/(m^2). GENERAL:  Well appearing HEENT:  Pupils equal round and reactive, fundi not visualized, oral mucosa unremarkable NECK:  No jugular venous distention, waveform within normal limits, carotid upstroke brisk and symmetric, no bruits, no thyromegaly LYMPHATICS:  No cervical, inguinal adenopathy LUNGS:  Clear to auscultation bilaterally BACK:  No CVA tenderness CHEST:  Unremarkable HEART:  PMI is displaced or sustained,S1 and S2 within normal limits, no S3, S4, no clicks, no rubs, no murmurs ABD:  Flat, positive bowel sounds normal in frequency in pitch, no bruits, no rebound, no guarding, no midline pulsatile mass, no hepatomegaly, no  splenomegaly EXT:  2 plus pulses throughout, no edema, no cyanosis no clubbing   EKG:  EKG is not ordered today.   Recent Labs: 04/07/2015: Pro B Natriuretic peptide (BNP) 1954.0*; TSH 2.14 04/13/2015: Hemoglobin 13.9; Platelets 208.0 04/23/2015: BUN 22; Creatinine, Ser 1.72*; Potassium 4.1; Sodium 143    Lipid Panel No results found for: CHOL, TRIG, HDL, CHOLHDL, VLDL, LDLCALC, LDLDIRECT    Wt Readings from Last 3 Encounters:  04/28/15 161 lb (73.029 kg)  04/26/15 162 lb (73.483 kg)  04/16/15 220 lb (99.791 kg)      Other studies Reviewed: Additional studies/ records that were reviewed today include: Office records..    ASSESSMENT AND PLAN:  CARDIOMYOPATHY:   For now I'm going to leave his meds when they were recently titrated.   We are going to keep trying to go up. I will cancel his heart failure clinic appointment for now. When we get to a maximum dose of his medications might consider cardiopulmonary stress testing and referral for advanced therapies if this is severe. We talked about salt and fluid restriction.  HTN:  This is being managed in the context of treating his CHF  CKD:   He does have stable renal insufficiency. His creatinine was checked a couple of days ago. We will follow this closely.  Current medicines are reviewed at length with the patient today.  The patient does not have concerns regarding medicines.  The following changes have been made: As above.   Disposition:   FU with me or Miguel Roach in 2 weeks.    Signed, Miguel Breeding, MD  04/28/2015 10:57 AM    [facility]

## 2015-04-28 NOTE — Patient Instructions (Signed)
Your physician recommends that you schedule a follow-up appointment in: 2 Weeks with Dr Percival Spanish or Truitt Merle

## 2015-04-29 NOTE — Progress Notes (Unsigned)
Subject met inclusion and exclusion criteria for the [place]. The informed consent and study requirements were reviewed with the subject and the subject's questions and concerns were addressed prior to the signing of the consent form. The subject verbalized understanding of the trial requirements and agreed to participate in the trial. The subject signed the consent form on 04/16/2015. The informed consent was obtained prior to performance of any protocol-specific procedures. A copy of the signed Informed Consent Form was given to the subject and a copy was placed in the subject's medical record.  Blossom Hoops, RN Clinical Research Nurse [facility]

## 2015-05-07 ENCOUNTER — Ambulatory Visit (INDEPENDENT_AMBULATORY_CARE_PROVIDER_SITE_OTHER): Payer: Medicare Other | Admitting: Cardiology

## 2015-05-07 ENCOUNTER — Encounter: Payer: Self-pay | Admitting: Cardiology

## 2015-05-07 VITALS — BP 98/70 | HR 107 | Ht 71.0 in | Wt 167.0 lb

## 2015-05-07 DIAGNOSIS — I428 Other cardiomyopathies: Secondary | ICD-10-CM

## 2015-05-07 DIAGNOSIS — I429 Cardiomyopathy, unspecified: Secondary | ICD-10-CM

## 2015-05-07 MED ORDER — OLMESARTAN MEDOXOMIL 20 MG PO TABS: 20.0000 mg | ORAL_TABLET | Freq: Every day | ORAL | Status: DC

## 2015-05-07 NOTE — Progress Notes (Signed)
Cardiology Office Note   Date:  05/07/2015   ID:  Miguel Roach, DOB Feb 19, 1941, MRN [id number]  PCP:  Horatio Pel, MD  Cardiologist:   Minus Breeding, MD   Chief Complaint  Patient presents with  . Fatigue  . Shortness of Breath      History of Present Illness: Miguel Roach is a 74 y.o. male who presents for evaluation of dyspnea. He was seen in 2010 by another cardiologist. He was noted at that time to have an ejection fraction of about 40-45%. However, the patient did not go back for follow-up. He had no further problems. Marland Kitchen   He came to see me recently with increased SOB.  Repeat echo showed that his ejection fraction was down to 25%. He had a cardiac catheterization demonstrating nonobstructive coronary disease. He had some moderate pulmonary hypertension. He is now having his meds titrated.  He was doing a little bit better recently with med changes but today he's not feeling well. He's been much more fatigued over the last week or so. It's taking energy just to get out of his car. He's not describing resting PND or orthopnea. He's not having any palpitations, presyncope or syncope. He's had no chest pressure, neck or arm discomfort. He does have some increasing dyspnea as well. His blood pressures have been running low.   Past Medical History  Diagnosis Date  . Liver lesion 2007  . Renal cyst 2007  . Diverticulosis   . Internal hemorrhoids   . Adenomatous colon polyp 2011  . Gout   . CRI (chronic renal insufficiency)     creatinine back to normal  . Hypertension   . ED (erectile dysfunction)   . Arthritis   . Anal fistula 2003  . Nonischemic cardiomyopathy     EF 20-25% by echo 2016, At that time 40% RCA stenosis, distal 35% circumflex stenosis    Past Surgical History  Procedure Laterality Date  . Treatment fistula anal  2003    rapair  . Cardiac catheterization N/A 04/16/2015    Procedure: Right/Left Heart Cath and Coronary Angiography;  Surgeon:  Belva Crome, MD;  Location: [facility];  Service: Cardiovascular;  Laterality: N/A;     Current Outpatient Prescriptions  Medication Sig Dispense Refill  . aspirin 81 MG tablet Take 81 mg by mouth 2 (two) times a week.     . carvedilol (COREG) 6.25 MG tablet Take 1 tablet (6.25 mg total) by mouth 2 (two) times daily. 60 tablet 3  . colchicine 0.6 MG tablet Take 0.6 mg by mouth as needed.     . furosemide (LASIX) 40 MG tablet Take 0.5 tablets (20 mg total) by mouth daily. 30 tablet 12  . potassium chloride SA (K-DUR,KLOR-CON) 20 MEQ tablet Take 2 tablet once daily x 3 days then one tablet daily 34 tablet 12  . olmesartan (BENICAR) 20 MG tablet Take 1 tablet (20 mg total) by mouth daily. 30 tablet 9   No current facility-administered medications for this visit.    Allergies:   Review of patient's allergies indicates no known allergies.    ROS:  Please see the history of present illness.   Otherwise, review of systems are positive for constipation, mild cough with mucus.   All other systems are reviewed and negative.    PHYSICAL EXAM: VS:  BP 98/70 mmHg  Pulse 107  Ht 5\' 11"  (1.803 m)  Wt 167 lb (75.751 kg)  BMI 23.30 kg/m2 ,  BMI Body mass index is 23.3 kg/(m^2). GENERAL:  Fatigued appearing and thin HEENT:  Pupils equal round and reactive, fundi not visualized, oral mucosa unremarkable NECK:  No jugular venous distention, waveform within normal limits, carotid upstroke brisk and symmetric, no bruits, no thyromegaly LYMPHATICS:  No cervical, inguinal adenopathy LUNGS:  Clear to auscultation bilaterally BACK:  No CVA tenderness CHEST:  Unremarkable HEART:  PMI is displaced or sustained,S1 and S2 within normal limits, no S3, S4, no clicks, no rubs, no murmurs ABD:  Flat, positive bowel sounds normal in frequency in pitch, no bruits, no rebound, no guarding, no midline pulsatile mass, no hepatomegaly, no splenomegaly EXT:  2 plus pulses throughout, no edema, no cyanosis no  clubbing   EKG:  EKG is not ordered today.   Recent Labs: 04/07/2015: Pro B Natriuretic peptide (BNP) 1954.0*; TSH 2.14 04/13/2015: Hemoglobin 13.9; Platelets 208.0 04/23/2015: BUN 22; Creatinine, Ser 1.72*; Potassium 4.1; Sodium 143    Lipid Panel No results found for: CHOL, TRIG, HDL, CHOLHDL, VLDL, LDLCALC, LDLDIRECT    Wt Readings from Last 3 Encounters:  05/07/15 167 lb (75.751 kg)  04/28/15 161 lb (73.029 kg)  04/26/15 162 lb (73.483 kg)      Other studies Reviewed: Additional studies/ records that were reviewed today include:  Labs.    ASSESSMENT AND PLAN:  CARDIOMYOPATHY:   I am not going to be able to titrate his medications further. I'm going to back off on his Benicar 20 mg daily. I'm going to send him to EP for consideration of CRTD  HTN:  His blood pressure is now running low as the rate limiting step and med titration.  CKD:   He does have stable renal insufficiency. His creatinine was 1.72 recently and we will follow this closely.  Current medicines are reviewed at length with the patient today.  The patient does have concerns regarding medicines.  The following changes have been made: As above.   Disposition:   FU with me after he sees EP.  An appt has not been made yet.    Signed, Minus Breeding, MD  05/07/2015 3:16 PM    [place]

## 2015-05-07 NOTE — Patient Instructions (Signed)
You have been referred to Dr Caryl Comes or Dr Rayann Heman for ICD/Pacer consideration  Your physician has recommended you make the following change in your medication: Decrease Benicar 20 mg daily

## 2015-05-17 ENCOUNTER — Ambulatory Visit (HOSPITAL_BASED_OUTPATIENT_CLINIC_OR_DEPARTMENT_OTHER): Payer: Medicare Other | Attending: Nurse Practitioner

## 2015-05-17 DIAGNOSIS — I493 Ventricular premature depolarization: Secondary | ICD-10-CM | POA: Diagnosis not present

## 2015-05-17 DIAGNOSIS — I420 Dilated cardiomyopathy: Secondary | ICD-10-CM | POA: Diagnosis not present

## 2015-05-17 DIAGNOSIS — G4733 Obstructive sleep apnea (adult) (pediatric): Secondary | ICD-10-CM | POA: Insufficient documentation

## 2015-05-17 DIAGNOSIS — G4730 Sleep apnea, unspecified: Secondary | ICD-10-CM

## 2015-05-17 DIAGNOSIS — R0683 Snoring: Secondary | ICD-10-CM | POA: Insufficient documentation

## 2015-05-17 DIAGNOSIS — G4731 Primary central sleep apnea: Secondary | ICD-10-CM | POA: Insufficient documentation

## 2015-05-20 ENCOUNTER — Encounter: Payer: Self-pay | Admitting: Internal Medicine

## 2015-05-20 ENCOUNTER — Ambulatory Visit (INDEPENDENT_AMBULATORY_CARE_PROVIDER_SITE_OTHER): Payer: Medicare Other | Admitting: Internal Medicine

## 2015-05-20 VITALS — BP 88/70 | HR 78 | Ht 71.0 in | Wt 170.8 lb

## 2015-05-20 DIAGNOSIS — I5022 Chronic systolic (congestive) heart failure: Secondary | ICD-10-CM | POA: Diagnosis not present

## 2015-05-20 DIAGNOSIS — I428 Other cardiomyopathies: Secondary | ICD-10-CM

## 2015-05-20 DIAGNOSIS — I429 Cardiomyopathy, unspecified: Secondary | ICD-10-CM | POA: Diagnosis not present

## 2015-05-20 MED ORDER — CARVEDILOL 3.125 MG PO TABS: 3.1250 mg | ORAL_TABLET | Freq: Two times a day (BID) | ORAL | Status: DC

## 2015-05-20 NOTE — Patient Instructions (Signed)
Medication Instructions:  Your physician has recommended you make the following change in your medication:  1.  Decrease the Carvedilol to 3.125 mg twice daily   Labwork: None ordered  Testing/Procedures: Your physician has requested that you have an echocardiogram. Echocardiography is a painless test that uses sound waves to create images of your heart. It provides your doctor with information about the size and shape of your heart and how well your heart's chambers and valves are working. This procedure takes approximately one hour. There are no restrictions for this procedure.----in 10 weeks    Follow-Up: Your physician recommends that you schedule a follow-up appointment in: 12 weeks with Dr. Lovena Le   Any Other Special Instructions Will Be Listed Below (If Applicable).

## 2015-05-20 NOTE — Assessment & Plan Note (Signed)
He has a non-ischemic CM with symptoms of low cardiac output. I have asked the patient to back down on his coreg from 6.25 mg to 3.125 mg twice daily. He will continue his ARB although his wife notes a mild cough. Unclear if this is due to ARB or chf.

## 2015-05-20 NOTE — Progress Notes (Signed)
HPI Miguel Roach is referred today by Dr. Percival Roach for consideration for ICD implant. He is a pleasant 74 yo man who has a h/o LV dysfunction dating back 5-6 years. His EF was 40-45%. He did well until 2 months ago when he began to experience sob, fatigue and weakness, especially with exertion. The patient sought medical attention and ultimately underwent catheterization where he was found to have no obstructive CAD. Echo demonstrated and EF of 30%. He feels poorly and c/o weakness and fatigue. His symptoms worsened with uptitration of his beta blocker. He has mild peripheral edema. No syncope.  No Known Allergies   Current Outpatient Prescriptions  Medication Sig Dispense Refill  . aspirin 81 MG tablet Take 1 tablet by mouth 1-2 times a week    . carvedilol (COREG) 3.125 MG tablet Take 1 tablet (3.125 mg total) by mouth 2 (two) times daily. 180 tablet 3  . colchicine 0.6 MG tablet Take 0.6 mg by mouth daily as needed (gout).     . feeding supplement, ENSURE ENLIVE, (ENSURE ENLIVE) LIQD Take 237 mLs by mouth as directed.    . furosemide (LASIX) 40 MG tablet Take 0.5 tablets (20 mg total) by mouth daily. 30 tablet 12  . olmesartan (BENICAR) 20 MG tablet Take 1 tablet (20 mg total) by mouth daily. 30 tablet 9  . potassium chloride SA (K-DUR,KLOR-CON) 20 MEQ tablet Take 20 mEq by mouth daily.     No current facility-administered medications for this visit.     Past Medical History  Diagnosis Date  . Liver lesion 2007  . Renal cyst 2007  . Diverticulosis   . Internal hemorrhoids   . Adenomatous colon polyp 2011  . Gout   . CRI (chronic renal insufficiency)     creatinine back to normal  . Hypertension   . ED (erectile dysfunction)   . Arthritis   . Anal fistula 2003  . Nonischemic cardiomyopathy     EF 20-25% by echo 2016, At that time 40% RCA stenosis, distal 35% circumflex stenosis    ROS:   All systems reviewed and negative except as noted in the HPI.   Past Surgical  History  Procedure Laterality Date  . Treatment fistula anal  2003    rapair  . Cardiac catheterization N/A 04/16/2015    Procedure: Right/Left Heart Cath and Coronary Angiography;  Surgeon: Belva Crome, MD;  Location: [facility];  Service: Cardiovascular;  Laterality: N/A;     Family History  Problem Relation Age of Onset  . Heart failure Mother 52  . Sickle cell anemia Brother   . Breast cancer Sister   . Diabetes Sister   . Diabetes Father     died at age 40  . Diabetes Sister   . Colon polyps Brother   . Clotting disorder      two daughters (inherited from his first wife)     History   Social History  . Marital Status: Married    Spouse Name: N/A  . Number of Children: 4  . Years of Education: N/A   Occupational History  . Retired Environmental consultant   . Retired Animal nutritionist    Social History Main Topics  . Smoking status: Never Smoker   . Smokeless tobacco: Never Used  . Alcohol Use: No  . Drug Use: No  . Sexual Activity: Not on file   Other Topics Concern  . Not on file   Social History Narrative  BP 88/70 mmHg  Pulse 78  Ht 5\' 11"  (1.803 m)  Wt 170 lb 12.8 oz (77.474 kg)  BMI 23.83 kg/m2  Physical Exam:  Chronically ill appearing 73 yo man, NAD HEENT: Unremarkable Neck:  6 cm JVD, no thyromegally Back:  No CVA tenderness Lungs:  Clear with no wheezes HEART:  Regular rate rhythm, no murmurs, no rubs, no clicks Abd:  soft, positive bowel sounds, no organomegally, no rebound, no guarding Ext:  2 plus pulses, no edema, no cyanosis, no clubbing Skin:  No rashes no nodules Neuro:  CN II through XII intact, motor grossly intact  EKG - NSR with IVCD of 128 ms.  Assess/Plan:

## 2015-05-21 ENCOUNTER — Telehealth: Payer: Self-pay | Admitting: Cardiology

## 2015-05-21 MED ORDER — LOSARTAN POTASSIUM 50 MG PO TABS: 50.0000 mg | ORAL_TABLET | Freq: Every day | ORAL | Status: DC

## 2015-05-21 NOTE — Telephone Encounter (Signed)
Not able to sleep.  Tossing and turning all night  Attributes it to Benicar  Benicar dose reduced from 40 mg to 20 mg on 7/22 according to medication record  Patient would like something to help him sleep or have a different medication

## 2015-05-21 NOTE — Telephone Encounter (Signed)
Called patient.  Instructed to stop benicar  Start Lorsartan 50 mg a day  Repeated back instructions.  Knows to pick up at his pharmacy this afternoon

## 2015-05-21 NOTE — Telephone Encounter (Signed)
I doubt this is the reason for insomnia but can change to Losartan 50 mg a day.

## 2015-05-23 NOTE — Telephone Encounter (Signed)
Benicar should not be causing his insomnia.  He needs to speak to his primary MD about his sleep problems.

## 2015-05-25 ENCOUNTER — Encounter (HOSPITAL_COMMUNITY): Payer: Medicare Other

## 2015-05-28 NOTE — Telephone Encounter (Signed)
  Patient calling today to say he would like to have some blood work done to check his kidneys. He has switched his medications and has noticed some blood in his urine. Would like for someone to talk to Dr. Percival Spanish....         LVM to call  his PCP (Dr. Shelia Media) to suggest ordering a urinalysis.

## 2015-05-28 NOTE — Telephone Encounter (Signed)
Patient calling today to say he would like to have some blood work done to check his kidneys. He has switched his medications and has noticed some blood in his urine.  Would like for someone to talk to Dr. Percival Spanish.Marland KitchenMarland KitchenMarland Kitchen

## 2015-05-31 ENCOUNTER — Institutional Professional Consult (permissible substitution): Payer: Medicare Other | Admitting: Internal Medicine

## 2015-06-01 ENCOUNTER — Other Ambulatory Visit: Payer: Self-pay | Admitting: Internal Medicine

## 2015-06-01 DIAGNOSIS — N183 Chronic kidney disease, stage 3 unspecified: Secondary | ICD-10-CM

## 2015-06-04 ENCOUNTER — Ambulatory Visit
Admission: RE | Admit: 2015-06-04 | Discharge: 2015-06-04 | Disposition: A | Discharge status: Home / Self Care | Payer: Medicare Other | Source: Ambulatory Visit | Attending: Internal Medicine | Admitting: Internal Medicine

## 2015-06-04 DIAGNOSIS — N183 Chronic kidney disease, stage 3 unspecified: Secondary | ICD-10-CM

## 2015-06-08 ENCOUNTER — Ambulatory Visit: Payer: Medicare Other | Admitting: Cardiology

## 2015-06-10 NOTE — Sleep Study (Signed)
 NAME: [name] DATE OF BIRTH:  07/02/1941 MEDICAL RECORD:  [id number]  LOCATION: [facility]  PHYSICIAN: KELLY,THOMAS A  DATE OF STUDY: 05/17/2015  SLEEP STUDY TYPE: Split night study               REFERRING PHYSICIAN: Gerhardt, Lori C, NP   Patient Name: Miguel Roach, Miguel Roach Study Date: 05/17/2015 Gender: Male D.O.B: 02/06/1941 Age (years): 74 Referring Provider: Not Available Height (inches): 71 Interpreting Physician: Thomas Kelly MD, ABSM Weight (lbs): 172 RPSGT: Davis, Rico BMI: 24 MRN: [id number] Neck Size: 17.00 CLINICAL INFORMATION Sleep Study Type: Split Night CPAP  Indication for sleep study: Hypertension, OSA  Epworth Sleepiness Score: 10  SLEEP STUDY TECHNIQUE As per the AASM Manual for the Scoring of Sleep and Associated Events v2.3 (April 2016) with a hypopnea requiring 4% desaturations.  The channels recorded and monitored were frontal, central and occipital EEG, electrooculogram (EOG), submentalis EMG (chin), nasal and oral airflow, thoracic and abdominal wall motion, anterior tibialis EMG, snore microphone, electrocardiogram, and pulse oximetry. Continuous positive airway pressure (CPAP) was initiated when the patient met split night criteria and was titrated according to treat sleep-disordered breathing.  MEDICATIONS Medications taken by the patient :   aspirin 81 MG tablet      carvedilol (COREG) 3.125 MG tablet 3.125 mg, 2 times daily     colchicine 0.6 MG tablet 0.6 mg, Daily PRN     feeding supplement, ENSURE ENLIVE, (ENSURE ENLIVE) LIQD 237 mL, As directed     furosemide (LASIX) 40 MG tablet 20 mg, Daily     losartan (COZAAR) 50 MG tablet 50 mg, Daily     olmesartan (BENICAR) 20 MG tablet 20 mg, Daily     potassium chloride SA (K-DUR,   Medications administered by patient during sleep study : No sleep medicine administered.  RESPIRATORY PARAMETERS Diagnostic  Total AHI (/hr): 52.8 RDI (/hr): 53.7 OA Index  (/hr): 1.4 CA Index (/hr): 8.1 REM AHI (/hr): 49.2 NREM AHI (/hr): 53.4 Supine AHI (/hr): 50.9 Non-supine AHI (/hr): 53.89 Min O2 Sat (%): 76.00 Mean O2 (%): 92.25 Time below 88% (min): 33.7   Titration  Optimal Pressure (cm):  AHI at Optimal Pressure (/hr): N/A Min O2 at Optimal Pressure (%): 84.00 Supine % at Optimal (%): N/A Sleep % at Optimal (%): N/A    SLEEP ARCHITECTURE The recording time for the entire night was 366.0 minutes.  During a baseline period of 176.7 minutes, the patient slept for 133.0 minutes in REM and nonREM, yielding a sleep efficiency of 75.3%. Sleep onset after lights out was 3.9 minutes with a REM latency of 111.5 minutes. The patient spent 26.32% of the night in stage N1 sleep, 59.02% in stage N2 sleep, 0.00% in stage N3 and 14.66% in REM.  During the titration period of 175.5 minutes, the patient slept for 26.5 minutes in REM and nonREM, yielding a sleep efficiency of 15.1%. Sleep onset after CPAP initiation was 25.3 minutes with a REM latency of N/A minutes. The patient spent 81.13% of the night in stage N1 sleep, 18.87% in stage N2 sleep, 0.00% in stage N3 and 0.00% in REM.  CARDIAC DATA The 2 lead EKG demonstrated sinus rhythm. The mean heart rate was 75.18 beats per minute. Other EKG findings include: PVCs.  LEG MOVEMENT DATA The total Periodic Limb Movements of Sleep (PLMS) were 0. The PLMS index was 0.00 .  IMPRESSIONS Severe obstructive sleep apnea occurred during the diagnostic portion of the study (AHI =   52.8/hour). Suboptimal CPAP titration up to only 8 cm water pressure with an AHI of 22.7/hr at this pressure. Mild central sleep apnea occurred during the diagnostic portion of the study (CAI = 8.1/hour). Significant oxygen desaturation was noted during the diagnostic portion of the study (Min O2 =76.00%). The patient snored with Moderate snoring volume during the diagnostic portion of the study. EKG findings include PVCs with couplets and multifocal  etiology.  Clinically significant periodic limb movements did not occur during sleep. 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  Avoid alcohol, sedatives and other CNS depressants that may worsen sleep apnea and disrupt normal sleep architecture. Sleep hygiene should be reviewed to assess factors that may improve sleep quality. Weight management and regular exercise should be initiated or continued. Return to Sleep Clinic after BiPAP titration.    [place], American Board of Sleep Medicine  ELECTRONICALLY SIGNED ON:  06/10/2015, 4:16 PM [place] PH: [phone]   FX: [phone] [place]

## 2015-06-14 ENCOUNTER — Telehealth: Payer: Self-pay | Admitting: *Deleted

## 2015-06-14 NOTE — Progress Notes (Signed)
Left message to return a call 8/29

## 2015-06-14 NOTE — Telephone Encounter (Signed)
-----   Message from Miguel Sine, MD sent at 06/10/2015  4:41 PM EDT ----- Mariann Laster please schedule for BiPAP titration

## 2015-06-15 ENCOUNTER — Ambulatory Visit (INDEPENDENT_AMBULATORY_CARE_PROVIDER_SITE_OTHER): Payer: Medicare Other | Admitting: Pulmonary Disease

## 2015-06-15 ENCOUNTER — Telehealth: Payer: Self-pay | Admitting: Cardiology

## 2015-06-15 ENCOUNTER — Telehealth: Payer: Self-pay | Admitting: *Deleted

## 2015-06-15 ENCOUNTER — Encounter: Payer: Self-pay | Admitting: Pulmonary Disease

## 2015-06-15 VITALS — BP 126/68 | HR 83 | Ht 71.0 in | Wt 170.0 lb

## 2015-06-15 DIAGNOSIS — J90 Pleural effusion, not elsewhere classified: Secondary | ICD-10-CM | POA: Diagnosis not present

## 2015-06-15 DIAGNOSIS — R0600 Dyspnea, unspecified: Secondary | ICD-10-CM

## 2015-06-15 NOTE — Telephone Encounter (Signed)
-----   Message from Troy Sine, MD sent at 06/10/2015  4:41 PM EDT ----- Mariann Laster please schedule for BiPAP titration

## 2015-06-15 NOTE — Progress Notes (Signed)
Left message to return a call. 

## 2015-06-15 NOTE — Patient Instructions (Signed)
We will call your primary care doctor's office in order to add on lab work We will arrange a thoracentesis at [facility] tomorrow We will call you with the results of the thoracentesis as soon as they are available and help adjust your diuretic medicine We will see you back in 2 weeks or sooner if needed

## 2015-06-15 NOTE — Telephone Encounter (Signed)
Miguel Roach is returning a call in reference to Miguel Roach. Please call   Thanks

## 2015-06-15 NOTE — Assessment & Plan Note (Signed)
He has been experiencing progressive dyspnea over the last several weeks in line with his recent diagnosis of nonischemic cardiomyopathy. This is not associated with chest pain. He does have a cough which has been occurring in the evenings. Objectively he has diminished breath sounds in the right base on exam today, and his most recent chest imaging shows a moderate sized pleural effusion in the right lung. He is also volume overloaded on exam with significant leg edema.  I think the most likely explanation for his dyspnea is congestive heart failure leading to this right-sided pleural effusion. It sounds as if he does not diuresis with his current dose of Lasix. I see no clear evidence of an underlying lung disease though he has not had pulmonary function testing yet. Certainly anemia could also be causing this but he's not been anemic on his most recent lab work.  Plan: Check CBC Thoracentesis as detailed below Once the thoracentesis results are back we will adjust his Lasix dosing If no improvement with these interventions then pulmonary function testing

## 2015-06-15 NOTE — Assessment & Plan Note (Signed)
This is a new problem, unilateral, and associated with volume overload on physical exam. I think it's most likely related to congestive heart failure but unexplained and today that the differential diagnosis is broad and includes inflammatory, infectious, and malignant causes.  Plan: Diagnostic and therapeutic thoracentesis at [facility], CBC now through primary care doctor's office Tomorrow we will check cell count, cytology, LDH, protein, albumin

## 2015-06-15 NOTE — Telephone Encounter (Signed)
Routed to [place]

## 2015-06-15 NOTE — Progress Notes (Signed)
Subjective:    Patient ID: Miguel Roach, male    DOB: 1941-07-18, 74 y.o.   MRN: [id number]  HPI Chief Complaint  Patient presents with  . Advice Only    referred by Dr. Shelia Media for increased sob X3 mos, worse when laying down.    This is a pleasant 74 year old male with a new diagnosis of nonischemic cardiomyopathy who comes to my clinic today for evaluation of dyspnea. He says that the dyspnea started suddenly approximately 3 months ago in early June when he was working. He said that he was walking up a loading ramp and he noticed the sudden onset of fatigue. This has been associated with dyspnea. This eventually led to a heart catheterization which showed nonischemic cardiomyopathy with an LVEF of 20-25%. He also had moderate pulmonary hypertension with a PA pressure of 46/18. Since that time he's been seen by 2 separate cardiologist and has been considered for an ICD. He's been treated with Coreg as well as diuretics. He says that when he takes the Lasix he does not urinate. He's been having worsening leg swelling and orthopnea. He has a cough at night which is non-productive of mucus. He denies fevers or chills. His weight initially dropped but lately it has been increasing. He's also been evaluated for some microscopic hematuria.  He has been noted recently to have an increasing pleural effusion on the right side. About 3 weeks ago he noticed some sharp stabbing pain in the right chest. This occurred only for about an hour and resolved with Tylenol. He's not had symptoms of that since then.   Past Medical History  Diagnosis Date  . Liver lesion 2007  . Renal cyst 2007  . Diverticulosis   . Internal hemorrhoids   . Adenomatous colon polyp 2011  . Gout   . CRI (chronic renal insufficiency)     creatinine back to normal  . Hypertension   . ED (erectile dysfunction)   . Arthritis   . Anal fistula 2003  . Nonischemic cardiomyopathy     EF 20-25% by echo 2016, At that time 40% RCA  stenosis, distal 35% circumflex stenosis     Family History  Problem Relation Age of Onset  . Heart failure Mother 40  . Sickle cell anemia Brother   . Breast cancer Sister   . Diabetes Sister   . Diabetes Father     died at age 51  . Diabetes Sister   . Colon polyps Brother   . Clotting disorder      two daughters (inherited from his first wife)     Social History   Social History  . Marital Status: Married    Spouse Name: N/A  . Number of Children: 4  . Years of Education: N/A   Occupational History  . Retired Environmental consultant   . Retired Animal nutritionist    Social History Main Topics  . Smoking status: Never Smoker   . Smokeless tobacco: Never Used  . Alcohol Use: No  . Drug Use: No  . Sexual Activity: Not on file   Other Topics Concern  . Not on file   Social History Narrative     Allergies  Allergen Reactions  . Benicar [Olmesartan] Cough     Outpatient Prescriptions Prior to Visit  Medication Sig Dispense Refill  . carvedilol (COREG) 3.125 MG tablet Take 1 tablet (3.125 mg total) by mouth 2 (two) times daily. 180 tablet 3  . furosemide (LASIX) 40 MG tablet  Take 0.5 tablets (20 mg total) by mouth daily. (Patient taking differently: Take 60 mg by mouth daily. ) 30 tablet 12  . aspirin 81 MG tablet Take 1 tablet by mouth 1-2 times a week    . colchicine 0.6 MG tablet Take 0.6 mg by mouth daily as needed (gout).     . feeding supplement, ENSURE ENLIVE, (ENSURE ENLIVE) LIQD Take 237 mLs by mouth as directed.    Marland Kitchen losartan (COZAAR) 50 MG tablet Take 1 tablet (50 mg total) by mouth daily. (Patient not taking: Reported on 06/15/2015) 90 tablet 3  . olmesartan (BENICAR) 20 MG tablet Take 1 tablet (20 mg total) by mouth daily. (Patient not taking: Reported on 06/15/2015) 30 tablet 9  . potassium chloride SA (K-DUR,KLOR-CON) 20 MEQ tablet Take 20 mEq by mouth daily.     No facility-administered medications prior to visit.       Review of Systems    Constitutional: Negative for fever and unexpected weight change.  HENT: Negative for congestion, dental problem, ear pain, nosebleeds, postnasal drip, rhinorrhea, sinus pressure, sneezing, sore throat and trouble swallowing.   Eyes: Negative for redness and itching.  Respiratory: Positive for cough and shortness of breath. Negative for chest tightness and wheezing.   Cardiovascular: Negative for palpitations and leg swelling.  Gastrointestinal: Negative for nausea and vomiting.  Genitourinary: Negative for dysuria.  Musculoskeletal: Negative for joint swelling.  Skin: Negative for rash.  Neurological: Negative for headaches.  Hematological: Does not bruise/bleed easily.  Psychiatric/Behavioral: Negative for dysphoric mood. The patient is not nervous/anxious.        Objective:   Physical Exam Filed Vitals:   06/15/15 1350  BP: 126/68  Pulse: 83  Height: 5\' 11"  (1.803 m)  Weight: 170 lb (77.111 kg)  SpO2: 99%   room air  Gen: chronically ill appearing, no acute distress HENT: NCAT, OP clear, neck supple without masses Eyes: PERRL, EOMi Lymph: no cervical lymphadenopathy PULM: Diminished R base, otherwise clear CV: RRR, no mgr, no JVD GI: BS+, soft, nontender, no hsm Derm: no rash or skin breakdown, massive edema ankles MSK: normal bulk and tone Neuro: A&Ox4, CN II-XII intact, strength 5/5 in all 4 extremities Psyche: normal mood and affect  2016 sleep study showed AHI of 52, O2 saturation nadir 72%, 39 minutes less than 88%, CPAP titration suboptimal, titrated to 8 cm of water Cardiology visit from August 2016 reviewed where he was seen for nonischemic cardiomyopathy, Coreg was decreased     Assessment & Plan:  Dyspnea He has been experiencing progressive dyspnea over the last several weeks in line with his recent diagnosis of nonischemic cardiomyopathy. This is not associated with chest pain. He does have a cough which has been occurring in the evenings. Objectively he  has diminished breath sounds in the right base on exam today, and his most recent chest imaging shows a moderate sized pleural effusion in the right lung. He is also volume overloaded on exam with significant leg edema.  I think the most likely explanation for his dyspnea is congestive heart failure leading to this right-sided pleural effusion. It sounds as if he does not diuresis with his current dose of Lasix. I see no clear evidence of an underlying lung disease though he has not had pulmonary function testing yet. Certainly anemia could also be causing this but he's not been anemic on his most recent lab work.  Plan: Check CBC Thoracentesis as detailed below Once the thoracentesis results are back we will  adjust his Lasix dosing If no improvement with these interventions then pulmonary function testing  Pleural effusion This is a new problem, unilateral, and associated with volume overload on physical exam. I think it's most likely related to congestive heart failure but unexplained and today that the differential diagnosis is broad and includes inflammatory, infectious, and malignant causes.  Plan: Diagnostic and therapeutic thoracentesis at [facility], CBC now through primary care doctor's office Tomorrow we will check cell count, cytology, LDH, protein, albumin      Current outpatient prescriptions:  .  carvedilol (COREG) 3.125 MG tablet, Take 1 tablet (3.125 mg total) by mouth 2 (two) times daily., Disp: 180 tablet, Rfl: 3 .  furosemide (LASIX) 40 MG tablet, Take 0.5 tablets (20 mg total) by mouth daily. (Patient taking differently: Take 60 mg by mouth daily. ), Disp: 30 tablet, Rfl: 12

## 2015-06-16 ENCOUNTER — Ambulatory Visit (HOSPITAL_COMMUNITY)
Admission: RE | Admit: 2015-06-16 | Discharge: 2015-06-16 | Disposition: A | Discharge status: Home / Self Care | Payer: Medicare Other | Source: Ambulatory Visit | Attending: Physician Assistant | Admitting: Physician Assistant

## 2015-06-16 ENCOUNTER — Other Ambulatory Visit: Payer: Self-pay | Admitting: *Deleted

## 2015-06-16 ENCOUNTER — Ambulatory Visit (HOSPITAL_COMMUNITY)
Admission: RE | Admit: 2015-06-16 | Discharge: 2015-06-16 | Disposition: A | Discharge status: Home / Self Care | Payer: Medicare Other | Source: Ambulatory Visit | Attending: Pulmonary Disease | Admitting: Pulmonary Disease

## 2015-06-16 ENCOUNTER — Telehealth: Payer: Self-pay | Admitting: Pulmonary Disease

## 2015-06-16 DIAGNOSIS — G4733 Obstructive sleep apnea (adult) (pediatric): Secondary | ICD-10-CM

## 2015-06-16 DIAGNOSIS — R918 Other nonspecific abnormal finding of lung field: Secondary | ICD-10-CM | POA: Diagnosis not present

## 2015-06-16 DIAGNOSIS — J948 Other specified pleural conditions: Secondary | ICD-10-CM

## 2015-06-16 DIAGNOSIS — J90 Pleural effusion, not elsewhere classified: Secondary | ICD-10-CM | POA: Diagnosis present

## 2015-06-16 DIAGNOSIS — Z9889 Other specified postprocedural states: Secondary | ICD-10-CM

## 2015-06-16 LAB — GRAM STAIN

## 2015-06-16 LAB — ALBUMIN, FLUID (OTHER): ALBUMIN FL: 1 g/dL

## 2015-06-16 LAB — BODY FLUID CELL COUNT WITH DIFFERENTIAL
EOS FL: 0 %
LYMPHS FL: 63 %
Monocyte-Macrophage-Serous Fluid: 18 % — ABNORMAL LOW (ref 50–90)
Neutrophil Count, Fluid: 19 % (ref 0–25)
Total Nucleated Cell Count, Fluid: 425 cu mm (ref 0–1000)

## 2015-06-16 LAB — GLUCOSE, SEROUS FLUID: GLUCOSE FL: 99 mg/dL

## 2015-06-16 LAB — LACTATE DEHYDROGENASE, PLEURAL OR PERITONEAL FLUID: LD, Fluid: 81 U/L — ABNORMAL HIGH (ref 3–23)

## 2015-06-16 LAB — PROTEIN, BODY FLUID: Total protein, fluid: <3 g/dL

## 2015-06-16 NOTE — Progress Notes (Signed)
Patient and wife notified of sleep study results and recommendations. BIPAP titration study ordered.

## 2015-06-16 NOTE — Progress Notes (Signed)
Thoracentesis Performed.    Pre-procedure vitals:  HR 88, RR 24, SpO2 98, BP 107/78  Patient remained stable throughout the procedure.  No complications noted.  Sample sent to lab. Post Chest xray shows no pneumothorax.  Patient discharged per policy.   Discharge vitals: HR 81, SpO2 91%, RR 23, BP 117/74.    Patient tolerated well.

## 2015-06-16 NOTE — Telephone Encounter (Signed)
Patient's wife returned a call to me. Sleep study results and recommendations were explained to her. She voiced understanding. BIPAP titration ordered.

## 2015-06-16 NOTE — Procedures (Signed)
Thoracentesis Procedure Note  Pre-operative Diagnosis: Right pleural effusion  Post-operative Diagnosis: Same  Indications: Diagnostic and Therapeutic  Procedure Details  Consent: Informed consent was obtained. Risks of the procedure were discussed including: infection, bleeding, pain, pneumothorax.  Under sterile conditions the patient was positioned. Betadine solution and sterile drapes were utilized.  1% buffered lidocaine was used to anesthetize the 7th rib space. Fluid was obtained without any difficulties and minimal blood loss.  A dressing was applied to the wound and wound care instructions were provided.   Findings 1000 ml of clear amber pleural fluid was obtained. A sample was sent to Pathology for cytogenetics, flow, and cell counts, as well as for infection analysis.  Complications:  None; patient tolerated the procedure well.          Condition: stable  Plan A follow up chest x-ray was ordered. Bed Rest for 0 hours. Tylenol 650 mg. for pain.   Montey Hora, [facility] Pager: [phone]  or [phone] 06/16/2015, 12:32 PM   Attending Attestation: I was present for the entire procedure.

## 2015-06-16 NOTE — Telephone Encounter (Signed)
Gay Filler from Dr Pennie Banter office called and requested to speak to Dr Lake Bells before 4:30 today or sometime tomorrow morning Dr Pennie Banter office did not state the matter of the call; just that they needed rec of Dr Lake Bells Not an urgent matter  Dr Pennie Banter # is [phone]   Sent to Dr Lake Bells as a Juluis Rainier

## 2015-06-16 NOTE — Telephone Encounter (Signed)
BIPAP titration ordered.

## 2015-06-17 NOTE — Telephone Encounter (Signed)
I called Miguel Roach to let him know that his effusion showed a transudate.  Because of this I want him to take 40mg  lasix daily, not 20mg .  I had to leave a message.  Will route to triage so they can pass this message along to him tomorrow.

## 2015-06-17 NOTE — Telephone Encounter (Signed)
I have seen the results.  If the physicians need to talk to me, they can have my cell: [phone]

## 2015-06-17 NOTE — Telephone Encounter (Signed)
BQ please advise if this has been handled.  Thanks!

## 2015-06-17 NOTE — Telephone Encounter (Signed)
Called spoke with Gay Filler. She wants to confirm that we received the lab results she faxed over to [place] yesterday on CBC, hepatic, BMP, BNP. She also wants a call back to confirm we did get this. Please advise Caryl Pina thanks

## 2015-06-17 NOTE — Telephone Encounter (Signed)
Left detailed message on Miguel Roach's named vm letting her know that results have been received and were given to BQ yesterday.  Nothing further needed.

## 2015-06-17 NOTE — Telephone Encounter (Signed)
Gay Filler from Dr. Shelia Media office cb, [phone]

## 2015-06-18 ENCOUNTER — Institutional Professional Consult (permissible substitution): Payer: Medicare Other | Admitting: Internal Medicine

## 2015-06-18 ENCOUNTER — Telehealth: Payer: Self-pay

## 2015-06-18 NOTE — Telephone Encounter (Signed)
Pt wife, [phone]

## 2015-06-18 NOTE — Telephone Encounter (Signed)
OK, make sure that he had it increased this week.  If so then I'm OK to leave it.   If it was increased over a week ago then we to have him take 40mg  twice a day for 5 days, then go back to 40mg  daily.

## 2015-06-18 NOTE — Telephone Encounter (Signed)
lmtcb X1 for pt to review below recs.

## 2015-06-18 NOTE — Telephone Encounter (Signed)
Juanito Doom, MD at 06/17/2015 6:14 PM     Status: Signed       Expand All Collapse All   I called Mr. Vanduyne to let him know that his effusion showed a transudate. Because of this I want him to take 40mg  lasix daily, not 20mg .  I had to leave a message. Will route to triage so they can pass this message along to him tomorrow.       Spoke to pt's wife, aware of results.  States PCP has already increased lasix to 40mg . Wants to make sure BQ is ok with this and that it does not need to be increased more.   BQ please advise.  Thanks!

## 2015-06-18 NOTE — Telephone Encounter (Signed)
Spoke with pt's wife, lasix has been increased in the past week so no change in therapy.  Pt's wife aware.  Nothing further needed at this time.

## 2015-06-19 LAB — ANA, BODY FLUID: Anti-Nuclear Ab, IgG: NOT DETECTED

## 2015-06-19 LAB — ADENOSIDE DEAMINASE, PLEURAL FL: ADENOSIDE DEAMINASE, PLEURAL FL: 3.3 U/L (ref 0.0–9.4)

## 2015-06-21 LAB — CULTURE, BODY FLUID-BOTTLE: CULTURE: NO GROWTH

## 2015-06-21 LAB — CULTURE, BODY FLUID W GRAM STAIN -BOTTLE

## 2015-06-23 LAB — PH, BODY FLUID: pH, Body Fluid: 7.8

## 2015-06-30 ENCOUNTER — Encounter: Payer: Self-pay | Admitting: Pulmonary Disease

## 2015-06-30 ENCOUNTER — Ambulatory Visit (INDEPENDENT_AMBULATORY_CARE_PROVIDER_SITE_OTHER): Payer: Medicare Other | Admitting: Pulmonary Disease

## 2015-06-30 VITALS — BP 122/74 | HR 70 | Ht 71.0 in | Wt 159.0 lb

## 2015-06-30 DIAGNOSIS — J90 Pleural effusion, not elsewhere classified: Secondary | ICD-10-CM

## 2015-06-30 NOTE — Patient Instructions (Signed)
Keep taking your diuretic medicines as you're doing. Follow-up with the heart failure clinic next week. Let us know if you have trouble breathing or a lung problem develop and we will be happy to see you back. Otherwise, we only need to see you on an as-needed basis.

## 2015-06-30 NOTE — Progress Notes (Signed)
   Subjective:    Patient ID: Miguel Roach, male    DOB: [birth year], 74 y.o.   MRN: [id number]  HPI Chief Complaint  Patient presents with  . Follow-up    post thoracentesis.  pt is much improved since thora, can now take deep breaths.  does c/o occasional nonprod cough qhs.     Breathing much better. Currently taking 40mg  lasix PM, 80mg  in the morning. He can take a deep breath now. He has a cough occasionally but it's not productive of anything. No fevers or chills. No chest pain. His leg swelling is much better.   Past Medical History  Diagnosis Date  . Liver lesion 2007  . Renal cyst 2007  . Diverticulosis   . Internal hemorrhoids   . Adenomatous colon polyp 2011  . Gout   . CRI (chronic renal insufficiency)     creatinine back to normal  . Hypertension   . ED (erectile dysfunction)   . Arthritis   . Anal fistula 2003  . Nonischemic cardiomyopathy     EF 20-25% by echo 2016, At that time 40% RCA stenosis, distal 35% circumflex stenosis      Review of Systems     Objective:   Physical Exam Filed Vitals:   06/30/15 1551  BP: 122/74  Pulse: 70  Height: 5\' 11"  (1.803 m)  Weight: 159 lb (72.122 kg)  SpO2: 96%    RA  Gen: well appearing HENT: OP clear, TM's clear, neck supple PULM: CTA B, normal percussion CV: RRR, no mgr, notable but improved ankle edema GI: BS+, soft, nontender Derm: no cyanosis or rash Psyche: normal mood and affect       Assessment & Plan:  Pleural effusion His dyspnea has resolved after therapeutic thoracentesis last week. The fluid albumen was 1 which is consistent with a transudate.  He is doing much better with diuretic therapy.  I believe that the transudate is due to his congestive heart failure.  Plan: Continue diuretic therapy as prescribed Follow-up with the heart failure clinic Follow-up with [place] on an as-needed basis.     Current outpatient prescriptions:  .  carvedilol (COREG) 3.125 MG tablet, Take 1 tablet  (3.125 mg total) by mouth 2 (two) times daily., Disp: 180 tablet, Rfl: 3 .  furosemide (LASIX) 40 MG tablet, Take 0.5 tablets (20 mg total) by mouth daily. (Patient taking differently: Take 60 mg by mouth daily. ), Disp: 30 tablet, Rfl: 12

## 2015-06-30 NOTE — Assessment & Plan Note (Signed)
His dyspnea has resolved after therapeutic thoracentesis last week. The fluid albumen was 1 which is consistent with a transudate.  He is doing much better with diuretic therapy.  I believe that the transudate is due to his congestive heart failure.  Plan: Continue diuretic therapy as prescribed Follow-up with the heart failure clinic Follow-up with [place] on an as-needed basis.

## 2015-07-02 ENCOUNTER — Emergency Department (HOSPITAL_BASED_OUTPATIENT_CLINIC_OR_DEPARTMENT_OTHER)
Admission: EM | Admit: 2015-07-02 | Discharge: 2015-07-02 | Disposition: A | Discharge status: Home / Self Care | Payer: Medicare Other | Attending: Emergency Medicine | Admitting: Emergency Medicine

## 2015-07-02 ENCOUNTER — Encounter (HOSPITAL_BASED_OUTPATIENT_CLINIC_OR_DEPARTMENT_OTHER): Payer: Self-pay | Admitting: *Deleted

## 2015-07-02 ENCOUNTER — Emergency Department (HOSPITAL_BASED_OUTPATIENT_CLINIC_OR_DEPARTMENT_OTHER): Payer: Medicare Other

## 2015-07-02 DIAGNOSIS — Y998 Other external cause status: Secondary | ICD-10-CM | POA: Insufficient documentation

## 2015-07-02 DIAGNOSIS — I129 Hypertensive chronic kidney disease with stage 1 through stage 4 chronic kidney disease, or unspecified chronic kidney disease: Secondary | ICD-10-CM | POA: Insufficient documentation

## 2015-07-02 DIAGNOSIS — S01112A Laceration without foreign body of left eyelid and periocular area, initial encounter: Secondary | ICD-10-CM | POA: Diagnosis present

## 2015-07-02 DIAGNOSIS — Z8719 Personal history of other diseases of the digestive system: Secondary | ICD-10-CM | POA: Insufficient documentation

## 2015-07-02 DIAGNOSIS — Q6100 Congenital renal cyst, unspecified: Secondary | ICD-10-CM | POA: Insufficient documentation

## 2015-07-02 DIAGNOSIS — Z8739 Personal history of other diseases of the musculoskeletal system and connective tissue: Secondary | ICD-10-CM | POA: Diagnosis not present

## 2015-07-02 DIAGNOSIS — Z9889 Other specified postprocedural states: Secondary | ICD-10-CM | POA: Insufficient documentation

## 2015-07-02 DIAGNOSIS — Y9389 Activity, other specified: Secondary | ICD-10-CM | POA: Insufficient documentation

## 2015-07-02 DIAGNOSIS — W19XXXA Unspecified fall, initial encounter: Secondary | ICD-10-CM

## 2015-07-02 DIAGNOSIS — S0990XA Unspecified injury of head, initial encounter: Secondary | ICD-10-CM | POA: Diagnosis not present

## 2015-07-02 DIAGNOSIS — S0181XA Laceration without foreign body of other part of head, initial encounter: Secondary | ICD-10-CM

## 2015-07-02 DIAGNOSIS — Z79899 Other long term (current) drug therapy: Secondary | ICD-10-CM | POA: Diagnosis not present

## 2015-07-02 DIAGNOSIS — Z86010 Personal history of colonic polyps: Secondary | ICD-10-CM | POA: Insufficient documentation

## 2015-07-02 DIAGNOSIS — W108XXA Fall (on) (from) other stairs and steps, initial encounter: Secondary | ICD-10-CM | POA: Diagnosis not present

## 2015-07-02 DIAGNOSIS — N189 Chronic kidney disease, unspecified: Secondary | ICD-10-CM | POA: Insufficient documentation

## 2015-07-02 DIAGNOSIS — Y9289 Other specified places as the place of occurrence of the external cause: Secondary | ICD-10-CM | POA: Diagnosis not present

## 2015-07-02 MED ORDER — LIDOCAINE HCL (PF) 1 % IJ SOLN
30.0000 mL | Freq: Once | INTRAMUSCULAR | Status: AC
  Administered 2015-07-02: 5 mL via INTRADERMAL
  Filled 2015-07-02: qty 30

## 2015-07-02 MED ORDER — TETANUS-DIPHTH-ACELL PERTUSSIS 5-2.5-18.5 LF-MCG/0.5 IM SUSP
0.5000 mL | Freq: Once | INTRAMUSCULAR | Status: DC
  Filled 2015-07-02: qty 0.5

## 2015-07-02 NOTE — ED Notes (Signed)
C/o fall this am around 0730 down approx 8 steps onto hardwood. No loc.  Hit head on floor. 1 inch laceration to above right eyebrow. C/o left wrist pain. Pt states he had glasses on at time of fall.

## 2015-07-02 NOTE — ED Notes (Signed)
Pt states he had tdap in past 2 years. Dr Dayna Barker aware. Order d/ced.

## 2015-07-02 NOTE — ED Notes (Signed)
Wound cleaned with NS using toomy syringe to irrigate. Tolerated well. NS drsg applied.

## 2015-07-02 NOTE — ED Provider Notes (Signed)
CSN: [id number]     Arrival date & time 07/02/15  0745 History   None    No chief complaint on file.    (Consider location/radiation/quality/duration/timing/severity/associated sxs/prior Treatment) HPI  [address] down 6 steps approx 1 hour PTA hitting head on floor with resulting laceration to left eyebrow. No LOC or complaints at this time. First fall ever. Pain improved. No visual or neurologic changes.   Past Medical History  Diagnosis Date  . Liver lesion 2007  . Renal cyst 2007  . Diverticulosis   . Internal hemorrhoids   . Adenomatous colon polyp 2011  . Gout   . CRI (chronic renal insufficiency)     creatinine back to normal  . Hypertension   . ED (erectile dysfunction)   . Arthritis   . Anal fistula 2003  . Nonischemic cardiomyopathy     EF 20-25% by echo 2016, At that time 40% RCA stenosis, distal 35% circumflex stenosis   Past Surgical History  Procedure Laterality Date  . Treatment fistula anal  2003    rapair  . Cardiac catheterization N/A 04/16/2015    Procedure: Right/Left Heart Cath and Coronary Angiography;  Surgeon: Belva Crome, MD;  Location: [place] [facility];  Service: Cardiovascular;  Laterality: N/A;   Family History  Problem Relation Age of Onset  . Heart failure Mother 72  . Sickle cell anemia Brother   . Breast cancer Sister   . Diabetes Sister   . Diabetes Father     died at age 42  . Diabetes Sister   . Colon polyps Brother   . Clotting disorder      two daughters (inherited from his first wife)   Social History  Substance Use Topics  . Smoking status: Never Smoker   . Smokeless tobacco: Never Used  . Alcohol Use: No    Review of Systems  Constitutional: Negative for fever and chills.  HENT: Negative for congestion and rhinorrhea.   Eyes: Negative for visual disturbance.  Respiratory: Negative for cough and shortness of breath.   Cardiovascular: Negative for chest pain.  Gastrointestinal: Negative for vomiting, abdominal pain,  diarrhea and constipation.  Endocrine: Negative for polyuria.  Genitourinary: Negative for dysuria and flank pain.  Musculoskeletal: Negative for back pain and neck pain.  Skin: Positive for wound. Negative for rash.  Neurological: Negative for dizziness, numbness and headaches.  All other systems reviewed and are negative.     Allergies  Benicar  Home Medications   Prior to Admission medications   Medication Sig Start Date End Date Taking? Authorizing Provider  carvedilol (COREG) 3.125 MG tablet Take 1 tablet (3.125 mg total) by mouth 2 (two) times daily. 05/20/15  Yes Thompson Grayer, MD  furosemide (LASIX) 40 MG tablet Take 0.5 tablets (20 mg total) by mouth daily. Patient taking differently: Take 60 mg by mouth daily.  04/26/15  Yes Burtis Junes, NP   BP 123/84 mmHg  Pulse 80  Temp(Src) 98.1 F (36.7 C) (Oral)  Resp 18  Ht 5\' 11"  (1.803 m)  Wt 161 lb (73.029 kg)  BMI 22.46 kg/m2  SpO2 99% Physical Exam  Constitutional: He is oriented to person, place, and time. He appears well-developed and well-nourished.  HENT:  Head: Normocephalic and atraumatic.  Eyes: Conjunctivae and EOM are normal.  Neck: Normal range of motion. Neck supple.  Cardiovascular: Normal rate and regular rhythm.   Pulmonary/Chest: Effort normal. No respiratory distress.  Abdominal: Soft. There is no tenderness.  Musculoskeletal: Normal range  of motion. He exhibits no edema or tenderness.  Neurological: He is alert and oriented to person, place, and time.  No altered mental status, able to give full seemingly accurate history.  Face is symmetric, EOM's intact, pupils equal and reactive, vision intact, tongue and uvula midline without deviation Upper and Lower extremity motor 5/5, intact pain perception in distal extremities, 2+ reflexes in biceps, patella and achilles tendons. Finger to nose normal, heel to shin normal. Walks without assistance or evident ataxia.  Skin: Skin is warm and dry.  Laceration  to left eyebrow and forehead, stellate, hemostatic  Nursing note and vitals reviewed.   ED Course  LACERATION REPAIR Date/Time: 07/02/2015 8:39 PM Performed by: Merrily Pew Authorized by: Merrily Pew Consent: Verbal consent obtained. Risks and benefits: risks, benefits and alternatives were discussed Consent given by: patient Patient understanding: patient does not state understanding of the procedure being performed Patient consent: the patient's understanding of the procedure does not match consent given Required items: required blood products, implants, devices, and special equipment available Patient identity confirmed: verbally with patient Time out: Immediately prior to procedure a "time out" was called to verify the correct patient, procedure, equipment, support staff and site/side marked as required. Foreign bodies: no foreign bodies Tendon involvement: none Nerve involvement: none Vascular damage: no Anesthesia: local infiltration Local anesthetic: lidocaine 1% with epinephrine Anesthetic total: 3 ml Preparation: Patient was prepped and draped in the usual sterile fashion. Irrigation solution: saline Irrigation method: syringe Amount of cleaning: extensive Debridement: minimal Degree of undermining: none Skin closure: 6-0 Prolene (9) Subcutaneous closure: 5-0 Vicryl (2) Technique: complex Approximation: close Approximation difficulty: complex Dressing: antibiotic ointment and 4x4 sterile gauze Patient tolerance: Patient tolerated the procedure well with no immediate complications Comments: Complex stellate laceration with muscle involvement.   (including critical care time) Labs Review Labs Reviewed - No data to display  Imaging Review Ct Head Wo Contrast  07/02/2015   CLINICAL DATA:  [address] this morning around 7:30 a.m. Fell down 8 steps on hardwood. No loss of consciousness. Hit head on the floor. Laceration above right eyebrow. Patient had glasses on when he  fell.  EXAM: CT HEAD WITHOUT CONTRAST  CT MAXILLOFACIAL WITHOUT CONTRAST  TECHNIQUE: Multidetector CT imaging of the head and maxillofacial structures were performed using the standard protocol without intravenous contrast. Multiplanar CT image reconstructions of the maxillofacial structures were also generated.  COMPARISON:  None.  FINDINGS: CT HEAD FINDINGS  There is moderate central and cortical atrophy. Periventricular white matter changes are consistent with small vessel disease. Physiologic calcifications identified within the basal ganglia bilaterally. There is no intra or extra-axial fluid collection or mass lesion. The basilar cisterns and ventricles have a normal appearance. There is no CT evidence for acute infarction or hemorrhage.  Bone windows demonstrate left frontal scalp laceration associated with edema. There is no underlying calvarial fracture. There is a sclerotic lesion within the supraorbital left frontal bone measuring 10 mm. The this appears circumscribed and likely is benign in the absence of known malignancy. An adjacent sclerotic focus is also identified in the left frontal bone measuring 3 mm with typical features of a bone island.  CT MAXILLOFACIAL FINDINGS  There is mucoperiosteal thickening of the maxillary sinuses not associated with air-fluid levels. Left frontal scalp laceration and edema noted. The orbits and globes are intact. Nasal bones, bony nasal septum are intact. The pterygoid plates, zygomatic arches, mandible, and temporomandibular joints are intact.  Visualized portion of the cervical spine demonstrates significant  degenerative change without abnormal alignment of the vertebral bodies C2- C5.  IMPRESSION: 1.  No evidence for acute intracranial abnormality. 2. Atrophy and small vessel disease. 3. Left frontal scalp edema and laceration. 4. No acute calvarial fracture. 5. Probable benign sclerotic lesion within the left supraorbital frontal bone possibly a bone island. In  the absence of known primary malignancy, this is felt to be a benign process. 6. Chronic maxillary sinusitis. 7. Degenerative changes in the cervical spine.   Electronically Signed   By: Nolon Nations M.D.   On: 07/02/2015 09:06   Ct Maxillofacial Wo Cm  07/02/2015   CLINICAL DATA:  [address] this morning around 7:30 a.m. Fell down 8 steps on hardwood. No loss of consciousness. Hit head on the floor. Laceration above right eyebrow. Patient had glasses on when he fell.  EXAM: CT HEAD WITHOUT CONTRAST  CT MAXILLOFACIAL WITHOUT CONTRAST  TECHNIQUE: Multidetector CT imaging of the head and maxillofacial structures were performed using the standard protocol without intravenous contrast. Multiplanar CT image reconstructions of the maxillofacial structures were also generated.  COMPARISON:  None.  FINDINGS: CT HEAD FINDINGS  There is moderate central and cortical atrophy. Periventricular white matter changes are consistent with small vessel disease. Physiologic calcifications identified within the basal ganglia bilaterally. There is no intra or extra-axial fluid collection or mass lesion. The basilar cisterns and ventricles have a normal appearance. There is no CT evidence for acute infarction or hemorrhage.  Bone windows demonstrate left frontal scalp laceration associated with edema. There is no underlying calvarial fracture. There is a sclerotic lesion within the supraorbital left frontal bone measuring 10 mm. The this appears circumscribed and likely is benign in the absence of known malignancy. An adjacent sclerotic focus is also identified in the left frontal bone measuring 3 mm with typical features of a bone island.  CT MAXILLOFACIAL FINDINGS  There is mucoperiosteal thickening of the maxillary sinuses not associated with air-fluid levels. Left frontal scalp laceration and edema noted. The orbits and globes are intact. Nasal bones, bony nasal septum are intact. The pterygoid plates, zygomatic arches, mandible, and  temporomandibular joints are intact.  Visualized portion of the cervical spine demonstrates significant degenerative change without abnormal alignment of the vertebral bodies C2- C5.  IMPRESSION: 1.  No evidence for acute intracranial abnormality. 2. Atrophy and small vessel disease. 3. Left frontal scalp edema and laceration. 4. No acute calvarial fracture. 5. Probable benign sclerotic lesion within the left supraorbital frontal bone possibly a bone island. In the absence of known primary malignancy, this is felt to be a benign process. 6. Chronic maxillary sinusitis. 7. Degenerative changes in the cervical spine.   Electronically Signed   By: Nolon Nations M.D.   On: 07/02/2015 09:06   I have personally reviewed and evaluated these images and lab results as part of my medical decision-making.   EKG Interpretation None      MDM   Final diagnoses:  Facial laceration, initial encounter  Fall, initial encounter   Fall down steps 2/2 increased momentum. No LOC, will ct for bleed/fracture. Update tdap. Suture wound.  Wound explored extensively, sutured as above. Complex laceration. Will have fu w/ face trauma team to evaluate for possible scar revision. Able to raise eyebrow normally, doubt signifiant frontalis muscle injury. Doubt levator injury with normal eyelid closure and opening.   I have personally and contemperaneously reviewed labs and imaging and used in my decision making as above.   A medical screening exam was performed  and I feel the patient has had an appropriate workup for their chief complaint at this time and likelihood of emergent condition existing is low. They have been counseled on decision, discharge, follow up and which symptoms necessitate immediate return to the emergency department. They or their family verbally stated understanding and agreement with plan and discharged in stable condition.      Merrily Pew, MD 07/02/15 [phone]

## 2015-07-02 NOTE — ED Notes (Signed)
Bleeding noted through bandage, clean 4x4 applied.

## 2015-07-02 NOTE — ED Notes (Signed)
MD only used 46ml of lidocaine. Will return the other 70ml of lidocaine to pyxis.

## 2015-07-02 NOTE — ED Notes (Signed)
Patient transported to X-ray 

## 2015-07-05 ENCOUNTER — Telehealth (HOSPITAL_COMMUNITY): Payer: Self-pay | Admitting: *Deleted

## 2015-07-05 ENCOUNTER — Encounter (HOSPITAL_COMMUNITY): Payer: Self-pay

## 2015-07-05 ENCOUNTER — Emergency Department (HOSPITAL_BASED_OUTPATIENT_CLINIC_OR_DEPARTMENT_OTHER)
Admission: EM | Admit: 2015-07-05 | Discharge: 2015-07-05 | Disposition: A | Discharge status: Home / Self Care | Payer: Medicare Other | Attending: Emergency Medicine | Admitting: Emergency Medicine

## 2015-07-05 ENCOUNTER — Encounter (HOSPITAL_BASED_OUTPATIENT_CLINIC_OR_DEPARTMENT_OTHER): Payer: Self-pay

## 2015-07-05 ENCOUNTER — Ambulatory Visit (HOSPITAL_COMMUNITY)
Admission: RE | Admit: 2015-07-05 | Discharge: 2015-07-05 | Disposition: A | Discharge status: Home / Self Care | Payer: Medicare Other | Source: Ambulatory Visit | Attending: Cardiology | Admitting: Cardiology

## 2015-07-05 VITALS — BP 102/58 | HR 75 | Wt 157.2 lb

## 2015-07-05 DIAGNOSIS — I5022 Chronic systolic (congestive) heart failure: Secondary | ICD-10-CM | POA: Insufficient documentation

## 2015-07-05 DIAGNOSIS — Z87438 Personal history of other diseases of male genital organs: Secondary | ICD-10-CM | POA: Insufficient documentation

## 2015-07-05 DIAGNOSIS — N183 Chronic kidney disease, stage 3 unspecified: Secondary | ICD-10-CM | POA: Insufficient documentation

## 2015-07-05 DIAGNOSIS — I420 Dilated cardiomyopathy: Secondary | ICD-10-CM | POA: Diagnosis not present

## 2015-07-05 DIAGNOSIS — I129 Hypertensive chronic kidney disease with stage 1 through stage 4 chronic kidney disease, or unspecified chronic kidney disease: Secondary | ICD-10-CM | POA: Diagnosis not present

## 2015-07-05 DIAGNOSIS — Z5189 Encounter for other specified aftercare: Secondary | ICD-10-CM

## 2015-07-05 DIAGNOSIS — Z86010 Personal history of colonic polyps: Secondary | ICD-10-CM | POA: Insufficient documentation

## 2015-07-05 DIAGNOSIS — Z79899 Other long term (current) drug therapy: Secondary | ICD-10-CM | POA: Insufficient documentation

## 2015-07-05 DIAGNOSIS — Z9889 Other specified postprocedural states: Secondary | ICD-10-CM | POA: Diagnosis not present

## 2015-07-05 DIAGNOSIS — N189 Chronic kidney disease, unspecified: Secondary | ICD-10-CM | POA: Insufficient documentation

## 2015-07-05 DIAGNOSIS — Z8739 Personal history of other diseases of the musculoskeletal system and connective tissue: Secondary | ICD-10-CM | POA: Insufficient documentation

## 2015-07-05 DIAGNOSIS — Q6100 Congenital renal cyst, unspecified: Secondary | ICD-10-CM | POA: Insufficient documentation

## 2015-07-05 DIAGNOSIS — I428 Other cardiomyopathies: Secondary | ICD-10-CM | POA: Diagnosis not present

## 2015-07-05 DIAGNOSIS — Z4801 Encounter for change or removal of surgical wound dressing: Secondary | ICD-10-CM | POA: Insufficient documentation

## 2015-07-05 DIAGNOSIS — Z8719 Personal history of other diseases of the digestive system: Secondary | ICD-10-CM | POA: Diagnosis not present

## 2015-07-05 LAB — BASIC METABOLIC PANEL
ANION GAP: 10 (ref 5–15)
BUN: 31 mg/dL — ABNORMAL HIGH (ref 6–20)
CO2: 27 mmol/L (ref 22–32)
Calcium: 9.2 mg/dL (ref 8.9–10.3)
Chloride: 107 mmol/L (ref 101–111)
Creatinine, Ser: 2.27 mg/dL — ABNORMAL HIGH (ref 0.61–1.24)
GFR calc Af Amer: 31 mL/min — ABNORMAL LOW (ref >60)
GFR, EST NON AFRICAN AMERICAN: 27 mL/min — AB (ref >60)
Glucose, Bld: 113 mg/dL — ABNORMAL HIGH (ref 65–99)
POTASSIUM: 2.7 mmol/L — AB (ref 3.5–5.1)
SODIUM: 144 mmol/L (ref 135–145)

## 2015-07-05 LAB — BRAIN NATRIURETIC PEPTIDE

## 2015-07-05 MED ORDER — ISOSORB DINITRATE-HYDRALAZINE 20-37.5 MG PO TABS: 0.5000 | ORAL_TABLET | Freq: Three times a day (TID) | ORAL | Status: DC

## 2015-07-05 MED ORDER — POTASSIUM CHLORIDE CRYS ER 20 MEQ PO TBCR: EXTENDED_RELEASE_TABLET | ORAL | Status: DC

## 2015-07-05 NOTE — ED Provider Notes (Signed)
CSN: [id number]     Arrival date & time 07/05/15  1346 History   First MD Initiated Contact with Patient 07/05/15 1402     Chief Complaint  Patient presents with  . Wound Check     (Consider location/radiation/quality/duration/timing/severity/associated sxs/prior Treatment) Patient is a 74 y.o. male presenting with wound check. The history is provided by the patient.  Wound Check   patient presents for wound recheck. Had a fall with a sutured wound 3 days ago. States he fell down some stairs. No fevers. Still has sutures. It is above his and involving his left eyebrow. States he feels as if it is healing well.  Past Medical History  Diagnosis Date  . Liver lesion 2007  . Renal cyst 2007  . Diverticulosis   . Internal hemorrhoids   . Adenomatous colon polyp 2011  . Gout   . CRI (chronic renal insufficiency)     creatinine back to normal  . Hypertension   . ED (erectile dysfunction)   . Arthritis   . Anal fistula 2003  . Nonischemic cardiomyopathy     EF 20-25% by echo 2016, At that time 40% RCA stenosis, distal 35% circumflex stenosis   Past Surgical History  Procedure Laterality Date  . Treatment fistula anal  2003    rapair  . Cardiac catheterization N/A 04/16/2015    Procedure: Right/Left Heart Cath and Coronary Angiography;  Surgeon: Belva Crome, MD;  Location: [place] [facility];  Service: Cardiovascular;  Laterality: N/A;   Family History  Problem Relation Age of Onset  . Heart failure Mother 72  . Sickle cell anemia Brother   . Breast cancer Sister   . Diabetes Sister   . Diabetes Father     died at age 63  . Diabetes Sister   . Colon polyps Brother   . Clotting disorder      two daughters (inherited from his first wife)   Social History  Substance Use Topics  . Smoking status: Never Smoker   . Smokeless tobacco: Never Used  . Alcohol Use: No    Review of Systems  Constitutional: Negative for fever and chills.  Skin: Positive for wound.       Allergies  Benicar  Home Medications   Prior to Admission medications   Medication Sig Start Date End Date Taking? Authorizing Provider  carvedilol (COREG) 3.125 MG tablet Take 1 tablet (3.125 mg total) by mouth 2 (two) times daily. 05/20/15   Thompson Grayer, MD  furosemide (LASIX) 80 MG tablet Take 80 mg by mouth 2 (two) times daily.    Historical Provider, MD  isosorbide-hydrALAZINE (BIDIL) 20-37.5 MG per tablet Take 0.5 tablets by mouth 3 (three) times daily. 07/05/15   Larey Dresser, MD   BP 99/74 mmHg  Pulse 76  Temp(Src) 97.7 F (36.5 C) (Oral)  Resp 18  Ht 5\' 11"  (1.803 m)  Wt 160 lb (72.576 kg)  BMI 22.33 kg/m2  SpO2 100% Physical Exam  Constitutional: He appears well-developed.  HENT:  Healing wound to left eyebrow/forehead. Wound is well approximated. No purulent drainage.    ED Course  Procedures (including critical care time) Labs Review Labs Reviewed - No data to display  Imaging Review No results found. I have personally reviewed and evaluated these images and lab results as part of my medical decision-making.   EKG Interpretation None      MDM   Final diagnoses:  Visit for wound check    Patient with wound  recheck. Due to the location and the eyebrow I think he would benefit from another 2-3 days of suture time.    Davonna Belling, MD 07/05/15 [phone]

## 2015-07-05 NOTE — Telephone Encounter (Signed)
Notes Recorded by Scarlette Calico, RN on 07/05/2015 at 4:45 PM Per V.O. From Dr Aundra Dubin pt needs to take 80 meq today then start 40 meq in AM and 20 meq in PM, pt's wife aware and agreeable

## 2015-07-05 NOTE — Telephone Encounter (Signed)
-----   Message from Larey Dresser, MD sent at 07/05/2015  3:38 PM EDT ----- Take KCl 40 x 2 doses today, then 40 mEq daily.  Followup BMET 1 week.

## 2015-07-05 NOTE — ED Notes (Signed)
Was advised to return for suture recheck-placed left eyebrow/forehead 9/13

## 2015-07-05 NOTE — Progress Notes (Signed)
Patient ID: Miguel Roach, male   DOB: [birth year], 74 y.o.   MRN: [id number] PCP: Dr. Shelia Media Primary cardiologist: Dr. Percival Spanish  74 yo with history of chronic systolic CHF/nonischemic cardiomyopathy and CKD presents for CHF clinic evaluation. He actually had an echo back in 2010 with EF 40-45% at the time.  He says that this really was not followed up upon by a cardiologist and that he did well for a number of years after that. In 6/16, he was admitted with acute systolic CHF.  Echo showed EF down to 20-25%.  Cardiac cath showed nonobstructive CAD.  His Lasix has been gradually increased.  This has brought his weight down and improved his breathing, but creatinine has risen. He had been on olmesartan but this was stopped because he thought it was causing cough.  He was then put on losartan, but this was stopped with rise in creatinine.    Currently short of breath after walking 100 yards or walking up a flight of steps.  No orthopnea/PND.  No chest pain.  No palpitations.  SBP tends to run in the 100s.    ECG: NSR, LAE, left axis deviation, IVCD QRS 126 msec.   Labs (6/16): BNP 1954, TSH normal Labs (7/16): K 4.1, creatinine 1.72 Labs (8/16): creatinine 2.3  PMH: 1. CKD: Follows with Dr Mercy Pronovost. 2. Gout 3. Chronic systolic CHF: Nonischemic cardiomyopathy.  EF 40-45% in 2010.  Echo (6/16) with EF 20-25%, severe LV dilation, mild MR, mild AI, moderate LAE.  LHC/RHC (6/16) with nonobstructive CAD; mean RA 2, RV 48/1, PA 47/18, mean PCWP 12, CI 3.2.  4. ACEI cough.  5. Pleural effusion: Thoracentesis on right in 8/16.  It was a transudate, likely due to CHF.   SH: Married, retired Pharmacist, hospital, never smoked, no ETOH.   FH: Mother with "weak heart." Brother with sickle cell anemia.   ROS: All systems reviewed and negative except as per HPI.   Current Outpatient Prescriptions  Medication Sig Dispense Refill  . carvedilol (COREG) 3.125 MG tablet Take 1 tablet (3.125 mg total) by mouth 2 (two) times  daily. 180 tablet 3  . furosemide (LASIX) 80 MG tablet Take 80 mg by mouth 2 (two) times daily.    . isosorbide-hydrALAZINE (BIDIL) 20-37.5 MG per tablet Take 0.5 tablets by mouth 3 (three) times daily. 30 tablet 3  . potassium chloride SA (K-DUR,KLOR-CON) 20 MEQ tablet Take 2 tabs in AM and 1 tab in PM 90 tablet 3   No current facility-administered medications for this encounter.   BP 102/58 mmHg  Pulse 75  Wt 157 lb 4 oz (71.328 kg)  SpO2 98% General: NAD Neck: JVP 7, no thyromegaly or thyroid nodule.  Lungs: Clear to auscultation bilaterally with normal respiratory effort. CV: Nondisplaced PMI.  Heart regular S1/S2, no S3/S4, no murmur.  1+ ankle edema.  No carotid bruit.  Normal pedal pulses.  Abdomen: Soft, nontender, no hepatosplenomegaly, no distention.  Skin: Intact without lesions or rashes.  Neurologic: Alert and oriented x 3.  Psych: Normal affect. Extremities: No clubbing or cyanosis.  HEENT: Normal.   Assessment/Plan: 1. Chronic systolic CHF: Nonischemic cardiomyopathy, cardiac cath in 6/16 with nonobstructive disease and preserved cardiac output.  He has no history of heavy ETOH or drug abuse.  He has not been markedly hypertensive.  Will send SPEP/UPEP.  Would like to do cardiac MRI to assess for infiltrative disease or myocarditis, but creatinine is too high currently for contrast.  Possible prior myocarditis.  ?familial  cardiomyopathy => mother had "weak heart" of uncertain etiology.  NYHA class II-III symptoms, volume looks ok currently.  - Will plan either cardiac MRI or echo in 10/16, depending on creatinine level (probably will end up being echo).  If EF low, will need ICD.  Unlikely to be a good CRT candidate => has IVCD with QRS 126 msec.  - Continue Coreg 3.125 mg bid.  - Add Bidil 1/2 tab tid.  - Hold off on ARB or Entresto at this time with elevated creatinine.  - Continue Lasix 80 mg bid for now.  BMET/BNP today.  2. CKD: Creatinine 2.3 in 8/16.  Has risen some  with diuresis.  Will repeat BMET today, hold off on ARB/ARNI for now.   [name] 07/05/2015

## 2015-07-05 NOTE — Patient Instructions (Signed)
Routine lab work today. (spep upep bmet bnp) Will notify you of abnormal results   START Bidil (1/2 tablet) three times a day.  FOLLOW UP:2-3 weeks  Your provider requests you have a cardiac mri.

## 2015-07-06 ENCOUNTER — Telehealth (HOSPITAL_COMMUNITY): Payer: Self-pay | Admitting: *Deleted

## 2015-07-06 DIAGNOSIS — I420 Dilated cardiomyopathy: Secondary | ICD-10-CM

## 2015-07-06 LAB — PROTEIN ELECTROPHORESIS, SERUM
A/G Ratio: 1.1 (ref 0.7–1.7)
ALBUMIN ELP: 3.1 g/dL (ref 2.9–4.4)
ALPHA-2-GLOBULIN: 0.7 g/dL (ref 0.4–1.0)
Alpha-1-Globulin: 0.3 g/dL (ref 0.0–0.4)
BETA GLOBULIN: 0.9 g/dL (ref 0.7–1.3)
GAMMA GLOBULIN: 1 g/dL (ref 0.4–1.8)
Globulin, Total: 2.9 g/dL (ref 2.2–3.9)
Total Protein ELP: 6 g/dL (ref 6.0–8.5)

## 2015-07-06 LAB — UIFE/LIGHT CHAINS/TP QN, 24-HR UR
% BETA, URINE: 14.7 %
ALPHA 1 URINE: 4.8 %
Albumin, U: 61.1 %
Alpha 2, Urine: 7 %
FREE KAPPA/LAMBDA RATIO: 10.86 — AB (ref 2.04–10.37)
Free Lambda Lt Chains,Ur: 7.44 mg/L — ABNORMAL HIGH (ref 0.24–6.66)
Free Lt Chn Excr Rate: 80.8 mg/L — ABNORMAL HIGH (ref 1.35–24.19)
GAMMA GLOBULIN URINE: 12.4 %
Total Protein, Urine: 50.3 mg/dL

## 2015-07-06 MED ORDER — DIGOXIN 125 MCG PO TABS: 0.0625 mg | ORAL_TABLET | Freq: Every day | ORAL | Status: DC

## 2015-07-06 NOTE — Telephone Encounter (Signed)
Pt called to let us know that his BP this am was 83/52 HR was 68. Pt feeling very weak.  No energy at all.  Pt said he was scared to take the next dose of Bidil which is due any time.  Told pt that I would talk with DR Aundra Dubin and call back that it was ok to hold his Bidil next dose and we would call back to let him soon.

## 2015-07-06 NOTE — Telephone Encounter (Signed)
Spoke with pt about medication changes.  Pt verified he was taking a half of a tablet of Bidil Digoxin 0.0625 mg is called into the pharm Schedule for Dig level next wed Pt is taking KCL 40 qam and 20 qpm Told pt to not take Dig the morning of blood draw

## 2015-07-06 NOTE — Telephone Encounter (Signed)
Make sure that he was taking 1/2 tab tid of Bidil and not whole tab.  If taking 1/2 tab, stop it.  Would have him try digoxin 0.0625 daily given his very soft BP and concern for low output.  Will need digoxin level (trough) in 1 week.  Make sure he started KCl 40 qam/20 qpm.

## 2015-07-12 ENCOUNTER — Encounter (HOSPITAL_BASED_OUTPATIENT_CLINIC_OR_DEPARTMENT_OTHER): Payer: Medicare Other

## 2015-07-14 ENCOUNTER — Telehealth (HOSPITAL_COMMUNITY): Payer: Self-pay | Admitting: *Deleted

## 2015-07-14 ENCOUNTER — Ambulatory Visit (HOSPITAL_COMMUNITY)
Admission: RE | Admit: 2015-07-14 | Discharge: 2015-07-14 | Disposition: A | Discharge status: Home / Self Care | Payer: Medicare Other | Source: Ambulatory Visit | Attending: Cardiology | Admitting: Cardiology

## 2015-07-14 DIAGNOSIS — I5022 Chronic systolic (congestive) heart failure: Secondary | ICD-10-CM | POA: Diagnosis present

## 2015-07-14 LAB — BASIC METABOLIC PANEL
ANION GAP: 7 (ref 5–15)
BUN: 38 mg/dL — ABNORMAL HIGH (ref 6–20)
CALCIUM: 9.2 mg/dL (ref 8.9–10.3)
CO2: 26 mmol/L (ref 22–32)
CREATININE: 2.14 mg/dL — AB (ref 0.61–1.24)
Chloride: 111 mmol/L (ref 101–111)
GFR, EST AFRICAN AMERICAN: 34 mL/min — AB (ref >60)
GFR, EST NON AFRICAN AMERICAN: 29 mL/min — AB (ref >60)
Glucose, Bld: 129 mg/dL — ABNORMAL HIGH (ref 65–99)
Potassium: 4 mmol/L (ref 3.5–5.1)
SODIUM: 144 mmol/L (ref 135–145)

## 2015-07-14 LAB — DIGOXIN LEVEL: DIGOXIN LVL: 0.3 ng/mL — AB (ref 0.8–2.0)

## 2015-07-14 NOTE — Telephone Encounter (Signed)
Patient came in for labs and requested that we send his labs to  [facility], Dr Shelia Media and his kidney specialist, Dr Hale Bogus.

## 2015-07-16 ENCOUNTER — Telehealth: Payer: Self-pay | Admitting: Cardiology

## 2015-07-16 LAB — FUNGUS CULTURE W SMEAR: Fungal Smear: NONE SEEN

## 2015-07-16 NOTE — Telephone Encounter (Signed)
Rec'd from [place] forward 9 pages to Dr. Percival Spanish

## 2015-07-28 ENCOUNTER — Ambulatory Visit (HOSPITAL_COMMUNITY)
Admission: RE | Admit: 2015-07-28 | Discharge: 2015-07-28 | Disposition: A | Discharge status: Home / Self Care | Payer: Medicare Other | Source: Ambulatory Visit | Attending: Cardiology | Admitting: Cardiology

## 2015-07-28 VITALS — BP 92/64 | HR 67 | Wt 149.8 lb

## 2015-07-28 DIAGNOSIS — N183 Chronic kidney disease, stage 3 unspecified: Secondary | ICD-10-CM

## 2015-07-28 DIAGNOSIS — I5022 Chronic systolic (congestive) heart failure: Secondary | ICD-10-CM | POA: Diagnosis not present

## 2015-07-28 DIAGNOSIS — I420 Dilated cardiomyopathy: Secondary | ICD-10-CM | POA: Diagnosis not present

## 2015-07-28 DIAGNOSIS — Z79899 Other long term (current) drug therapy: Secondary | ICD-10-CM | POA: Insufficient documentation

## 2015-07-28 DIAGNOSIS — I428 Other cardiomyopathies: Secondary | ICD-10-CM | POA: Diagnosis not present

## 2015-07-28 MED ORDER — SPIRONOLACTONE 25 MG PO TABS: 12.5000 mg | ORAL_TABLET | Freq: Every day | ORAL | Status: DC

## 2015-07-28 MED ORDER — POTASSIUM CHLORIDE CRYS ER 20 MEQ PO TBCR: 20.0000 meq | EXTENDED_RELEASE_TABLET | Freq: Two times a day (BID) | ORAL | Status: DC

## 2015-07-28 MED ORDER — FUROSEMIDE 40 MG PO TABS: ORAL_TABLET | ORAL | Status: DC

## 2015-07-28 NOTE — Patient Instructions (Signed)
Start Spironolactone 12.5 mg daily  Start Lasix 80 mg in the am and 40 mg in the afternoon  Start Potassium 20 meq twice a day  Follow up in 1 month  Will move echo to December  Cardiopulmonary exercise test  Cardiopulmonary Stress Test Cardiopulmonary stress testing looks at how your body uses oxygen and how it responds to exercise. The test will evaluate your:  Heart.  Lungs.  Muscles. INDICATIONS A cardiopulmonary stress test is usually indicated for the following:  Evaluation of unexplained shortness of breath.  Evaluation of exercise tolerance for a person with heart failure or coronary artery disease (CAD).  Evaluation of lung function. This is useful in patients with chronic obstructive pulmonary disease (COPD) or pulmonary hypertension.  To evaluate how fit you are.  To assess your eligibility for disability.  Preoperative evaluation for:  Heart surgery.  Lung surgery.  Heart/Lung transplants. BEFORE THE PROCEDURE  Do not eat for at least two hours prior to the test or as told by your caregiver.  Do not drink alcohol 24 hours prior to the test.  Do not smoke, drink caffeinated or carbonated beverages for at least 6 hours prior to the test.  Wear loose, comfortable clothing and shoes.  If you use an inhaler, you should bring it with you to the test. PROCEDURE  Electrodes will be attached to your chest. These will be attached to a monitor that will monitor your heart rate.  A nose clip will be applied to your nose.  A headpiece will be placed on your head. This will hold a breathing tube in place that you will breath through during the test.  You will exercise using either a bicycle or a treadmill.  Usually the study will begin with a few minutes of simple exercise followed by increased exercise.  Your blood pressure, heart rate and breathing will be monitored during and after the test. You will go home when your caregiver feels it is safe for you  to do so. SEEK IMMEDIATE MEDICAL CARE IF:  You develop pain or pressure in the following areas:  Chest.  Jaw or neck.  Between your shoulder blades.  Pain radiating down your left arm.  You develop nausea (feeling sick to your stomach).  You develop vomiting.  You develop fainting or dizziness.  You develop shortness of breath. MAKE SURE YOU:   Understand these instructions.  Will watch your condition.  Will get help right away if you are not doing well or get worse.   This information is not intended to replace advice given to you by your health care provider. Make sure you discuss any questions you have with your health care provider.   Document Released: 09/20/2009 Document Revised: 10/23/2014 Document Reviewed: 09/20/2009 Elsevier Interactive Patient Education [employer].

## 2015-07-29 ENCOUNTER — Other Ambulatory Visit (HOSPITAL_COMMUNITY): Payer: Medicare Other

## 2015-07-29 LAB — AFB CULTURE WITH SMEAR (NOT AT ARMC): ACID FAST SMEAR: NONE SEEN

## 2015-07-29 NOTE — Progress Notes (Signed)
Patient ID: Miguel Roach, male   DOB: 1941-05-18, 74 y.o.   MRN: [id number] PCP: Dr. Shelia Media Primary cardiologist: Dr. Percival Spanish  74 yo with history of chronic systolic CHF/nonischemic cardiomyopathy and CKD presents for CHF clinic evaluation. He actually had an echo back in 2010 with EF 40-45% at the time.  He says that this really was not followed up upon by a cardiologist and that he did well for a number of years after that. In 6/16, he was admitted with acute systolic CHF.  Echo showed EF down to 20-25%.  Cardiac cath showed nonobstructive CAD.  His Lasix has been gradually increased.  This has brought his weight down and improved his breathing, but creatinine has risen. He had been on olmesartan but this was stopped because he thought it was causing cough.  He was then put on losartan, but this was stopped with rise in creatinine.  At last appointment, I tried him on Bidil 1/2 tablet tid but he had to stop this due to orthostatic symptoms.  He was started on digoxin.  Today, he says that he has more energy.  He is walking more, can do 30-40 minutes on the track at the [facility].  No problem walking steadily on flat ground but dyspnea if walks fast or goes up steps.  No orthopnea/PND.  No chest pain.  No palpitations. SBP tends 90s-100s at home but dropped to the 80s when he tried to take low dose Bidil.  Weight is down 8 lbs.  ECG: NSR, LAE, left axis deviation, IVCD QRS 126 msec.   Labs (6/16): BNP 1954, TSH normal Labs (7/16): K 4.1, creatinine 1.72 Labs (8/16): creatinine 2.3 Labs (9/16): digoxin 0.3, K 4, creatinine 2.14, SPEP negative, UPEP negative  PMH: 1. CKD: Follows with Dr Mercy Pellegrin. 2. Gout 3. Chronic systolic CHF: Nonischemic cardiomyopathy.  EF 40-45% in 2010.  Echo (6/16) with EF 20-25%, severe LV dilation, mild MR, mild AI, moderate LAE.  LHC/RHC (6/16) with nonobstructive CAD; mean RA 2, RV 48/1, PA 47/18, mean PCWP 12, CI 3.2.  4. ACEI cough.  5. Pleural effusion: Thoracentesis on  right in 8/16.  It was a transudate, likely due to CHF.   SH: Married, retired Pharmacist, hospital, never smoked, no ETOH.   FH: Mother with "weak heart." Brother with sickle cell anemia.   ROS: All systems reviewed and negative except as per HPI.   Current Outpatient Prescriptions  Medication Sig Dispense Refill  . carvedilol (COREG) 3.125 MG tablet Take 1 tablet (3.125 mg total) by mouth 2 (two) times daily. 180 tablet 3  . digoxin (LANOXIN) 0.125 MG tablet Take 0.5 tablets (0.0625 mg total) by mouth daily. 30 tablet 3  . furosemide (LASIX) 40 MG tablet Take 80 mg (2 tablets) in the morning and 40 mg (1 tablet) in the afternoon 90 tablet 3  . potassium chloride SA (K-DUR,KLOR-CON) 20 MEQ tablet Take 1 tablet (20 mEq total) by mouth 2 (two) times daily. 90 tablet 3  . spironolactone (ALDACTONE) 25 MG tablet Take 0.5 tablets (12.5 mg total) by mouth daily. 30 tablet 3   No current facility-administered medications for this encounter.   BP 92/64 mmHg  Pulse 67  Wt 149 lb 12.8 oz (67.949 kg)  SpO2 95% General: NAD Neck: JVP 7, no thyromegaly or thyroid nodule.  Lungs: Clear to auscultation bilaterally with normal respiratory effort. CV: Nondisplaced PMI.  Heart regular S1/S2, no S3/S4, no murmur.  1+ ankle edema.  No carotid bruit.  Normal pedal  pulses.  Abdomen: Soft, nontender, no hepatosplenomegaly, no distention.  Skin: Intact without lesions or rashes.  Neurologic: Alert and oriented x 3.  Psych: Normal affect. Extremities: No clubbing or cyanosis.  HEENT: Normal.   Assessment/Plan: 1. Chronic systolic CHF: Nonischemic cardiomyopathy, cardiac cath in 6/16 with nonobstructive disease and preserved cardiac output.  He has no history of heavy ETOH or drug abuse.  He has not been markedly hypertensive.  Negative SPEP/UPEP.  Would like to do cardiac MRI to assess for infiltrative disease or myocarditis, but creatinine is too high currently for contrast.  Possible prior myocarditis.  ?familial  cardiomyopathy => mother had "weak heart" of uncertain etiology.  NYHA class II-III symptoms, volume looks ok currently.  Symptomatically stable, but low blood pressure has been very limiting in terms of getting him on CHF meds.  He has not been able to tolerate low doses of ARB or Bidil.  He is on low dose Coreg and seems to feel a bit better on digoxin now.  Cardiac output was not low on 6/16 RHC.  Volume status looks great today.  - Will arrange CPX for objective measure of functional capacity.  - Decrease Lasix to 80 qam/40 qpm and add spironolactone 12.5 mg daily.  Can decrease KCl to 20 bid.  He will need BMET today and again in 2 wks.  - Will plan either cardiac MRI or echo in 12/16 after medication titration, depending on creatinine level (probably will end up being echo).  If EF low, will need ICD.  Unlikely to be a good CRT candidate => has IVCD with QRS 126 msec.  - Continue Coreg 3.125 mg bid.  - Hold off on ARB or Entresto at this time with elevated creatinine. Off Bidil with symptomatic low BP.  - Continue digoxin, recent level was ok.  2. CKD: Stage III.  Will check BMET today.   [name] 07/29/2015

## 2015-08-03 ENCOUNTER — Encounter (HOSPITAL_COMMUNITY): Payer: Medicare Other

## 2015-08-11 ENCOUNTER — Other Ambulatory Visit (HOSPITAL_COMMUNITY): Payer: Self-pay | Admitting: *Deleted

## 2015-08-11 ENCOUNTER — Ambulatory Visit (HOSPITAL_COMMUNITY)
Admission: RE | Admit: 2015-08-11 | Discharge: 2015-08-11 | Disposition: A | Discharge status: Home / Self Care | Payer: Medicare Other | Source: Ambulatory Visit | Attending: Cardiology | Admitting: Cardiology

## 2015-08-11 ENCOUNTER — Ambulatory Visit (HOSPITAL_COMMUNITY): Payer: Medicare Other

## 2015-08-11 DIAGNOSIS — I420 Dilated cardiomyopathy: Secondary | ICD-10-CM

## 2015-08-11 DIAGNOSIS — I5022 Chronic systolic (congestive) heart failure: Secondary | ICD-10-CM

## 2015-08-11 LAB — BASIC METABOLIC PANEL
Anion gap: 5 (ref 5–15)
BUN: 22 mg/dL — ABNORMAL HIGH (ref 6–20)
CALCIUM: 9.1 mg/dL (ref 8.9–10.3)
CO2: 27 mmol/L (ref 22–32)
CREATININE: 1.62 mg/dL — AB (ref 0.61–1.24)
Chloride: 109 mmol/L (ref 101–111)
GFR, EST AFRICAN AMERICAN: 47 mL/min — AB (ref >60)
GFR, EST NON AFRICAN AMERICAN: 40 mL/min — AB (ref >60)
GLUCOSE: 89 mg/dL (ref 65–99)
Potassium: 4.6 mmol/L (ref 3.5–5.1)
Sodium: 141 mmol/L (ref 135–145)

## 2015-08-12 ENCOUNTER — Ambulatory Visit: Payer: Medicare Other | Admitting: Internal Medicine

## 2015-08-30 ENCOUNTER — Encounter: Payer: Self-pay | Admitting: *Deleted

## 2015-08-30 ENCOUNTER — Encounter (HOSPITAL_COMMUNITY): Payer: Self-pay

## 2015-08-30 ENCOUNTER — Ambulatory Visit (HOSPITAL_COMMUNITY)
Admission: RE | Admit: 2015-08-30 | Discharge: 2015-08-30 | Disposition: A | Discharge status: Home / Self Care | Payer: Medicare Other | Source: Ambulatory Visit | Attending: Cardiology | Admitting: Cardiology

## 2015-08-30 VITALS — BP 106/58 | HR 74 | Wt 154.0 lb

## 2015-08-30 DIAGNOSIS — Z006 Encounter for examination for normal comparison and control in clinical research program: Secondary | ICD-10-CM

## 2015-08-30 DIAGNOSIS — Z79899 Other long term (current) drug therapy: Secondary | ICD-10-CM | POA: Insufficient documentation

## 2015-08-30 DIAGNOSIS — N183 Chronic kidney disease, stage 3 unspecified: Secondary | ICD-10-CM

## 2015-08-30 DIAGNOSIS — I428 Other cardiomyopathies: Secondary | ICD-10-CM | POA: Diagnosis not present

## 2015-08-30 DIAGNOSIS — I5022 Chronic systolic (congestive) heart failure: Secondary | ICD-10-CM | POA: Diagnosis not present

## 2015-08-30 LAB — BASIC METABOLIC PANEL
ANION GAP: 6 (ref 5–15)
BUN: 23 mg/dL — AB (ref 6–20)
CALCIUM: 9.3 mg/dL (ref 8.9–10.3)
CO2: 28 mmol/L (ref 22–32)
Chloride: 109 mmol/L (ref 101–111)
Creatinine, Ser: 1.81 mg/dL — ABNORMAL HIGH (ref 0.61–1.24)
GFR, EST AFRICAN AMERICAN: 41 mL/min — AB (ref >60)
GFR, EST NON AFRICAN AMERICAN: 35 mL/min — AB (ref >60)
GLUCOSE: 84 mg/dL (ref 65–99)
POTASSIUM: 3.9 mmol/L (ref 3.5–5.1)
Sodium: 143 mmol/L (ref 135–145)

## 2015-08-30 LAB — BRAIN NATRIURETIC PEPTIDE: B Natriuretic Peptide: 1605 pg/mL — ABNORMAL HIGH (ref 0.0–100.0)

## 2015-08-30 LAB — DIGOXIN LEVEL: Digoxin Level: 1 ng/mL (ref 0.8–2.0)

## 2015-08-30 MED ORDER — SPIRONOLACTONE 25 MG PO TABS: 25.0000 mg | ORAL_TABLET | Freq: Every day | ORAL | Status: DC

## 2015-08-30 MED ORDER — FUROSEMIDE 40 MG PO TABS: 80.0000 mg | ORAL_TABLET | Freq: Two times a day (BID) | ORAL | Status: DC

## 2015-08-30 NOTE — Progress Notes (Signed)
Advanced Heart Failure Clinic Note   Patient ID: Miguel Roach, male   DOB: Nov 28, 1940, 74 y.o.   MRN: [id number] PCP: Dr. Shelia Roach Primary cardiologist: Dr. Percival Roach HF: Dr. Aundra Roach   74 yo with history of chronic systolic CHF/nonischemic cardiomyopathy and CKD presents for CHF clinic evaluation. He actually had an echo back in 2010 with EF 40-45% at the time.  He says that this really was not followed up upon by a cardiologist and that he did well for a number of years after that. In 6/16, he was admitted with acute systolic CHF.  Echo showed EF down to 20-25%.  Cardiac cath showed nonobstructive CAD.  His Lasix has been gradually increased.  This has brought his weight down and improved his breathing, but creatinine has risen. He had been on olmesartan but this was stopped because he thought it was causing cough.  He was then put on losartan, but this was stopped with rise in creatinine. Also intolerant to Bidil with orthostatic symptoms.  He was started on digoxin.  He returns today for regular follow up. At last visit we decreased lasix and added spiro. Weight up 5 lbs since last visit.  Symptomatically stable, can do 30-40 minutes on the track at the [facility].  No problem walking steadily on flat ground but dyspnea if walks fast or goes up steps.  No orthopnea/PND.  No chest pain.  No palpitations. SBP tends 90s-100s at home but dropped to the 80s when he tried to take low dose Bidil.  No lightheadedness or syncope.   ECG: NSR, left axis deviation, anterolateral T wave inversions, IVCD with QRS 126 msec   Labs (6/16): BNP 1954, TSH normal Labs (7/16): K 4.1, creatinine 1.72 Labs (8/16): creatinine 2.3 Labs (9/16): digoxin 0.3, K 4, creatinine 2.14, SPEP negative, UPEP negative Labs (10/16): K 4.6, creatinine 1.62  PMH: 1. CKD: Follows with Dr Miguel Roach. 2. Gout 3. Chronic systolic CHF: Nonischemic cardiomyopathy.  EF 40-45% in 2010.  Echo (6/16) with EF 20-25%, severe LV dilation, mild MR, mild  AI, moderate LAE.  LHC/RHC (6/16) with nonobstructive CAD; mean RA 2, RV 48/1, PA 47/18, mean PCWP 12, CI 3.2.  CPX (10/16) with peak VO2 13.1 (50% predicted), VE/VCO2 slope 39.3, RER 1.22 => moderate to severe functional limitation.  4. ACEI cough.  5. Pleural effusion: Thoracentesis on right in 8/16.  It was a transudate, likely due to CHF.   SH: Married, retired Pharmacist, hospital, never smoked, no ETOH.   FH: Mother with "weak heart." Brother with sickle cell anemia.   ROS: All systems reviewed and negative except as per HPI.   Current Outpatient Prescriptions  Medication Sig Dispense Refill  . carvedilol (COREG) 3.125 MG tablet Take 1 tablet (3.125 mg total) by mouth 2 (two) times daily. 180 tablet 3  . digoxin (LANOXIN) 0.125 MG tablet Take 0.5 tablets (0.0625 mg total) by mouth daily. 30 tablet 3  . furosemide (LASIX) 40 MG tablet Take 80 mg (2 tablets) in the morning and 40 mg (1 tablet) in the afternoon 90 tablet 3  . potassium chloride SA (K-DUR,KLOR-CON) 20 MEQ tablet Take 1 tablet (20 mEq total) by mouth 2 (two) times daily. 90 tablet 3  . spironolactone (ALDACTONE) 25 MG tablet Take 0.5 tablets (12.5 mg total) by mouth daily. 30 tablet 3   No current facility-administered medications for this encounter.   BP 106/58 mmHg  Pulse 74  Wt 154 lb (69.854 kg)  SpO2 99%   General: NAD Neck:  JVP 7-8 cm, no thyromegaly or lymphadenopathy.  Lungs: CTA, normal effort CV: Nondisplaced PMI.  Heart regular S1/S2 with wide S2 split, no S3/S4, no murmur.  1+ ankle edema bilaterally.  No carotid bruit.  Normal pedal pulses.  Abdomen: Soft, NT, ND, no HSM. +BS Skin: Intact without lesions or rashes.  Neurologic: Alert and oriented x 3.  Psych: Normal affect. Extremities: No clubbing or cyanosis.  HEENT: Normal.   Assessment/Plan: 1. Chronic systolic CHF: NICM, NYHA class II-III symptoms.  CO preserved on RHC in 6/16 , but recent CPX with moderate to severe functional impairment (seems worse than  his symptoms would suggest). No significant CAD with coronary angiography in 6/16.  No hx heavy ETOH or drug abuse. No marked HTN.  Negative SPEP/UPEP.  Possible prior myocarditis.  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 On exam today, he appears to be mildly volume overloaded.  Lasix was decreased at last appointment.  - Increase Lasix to 80 mg bid and spironolactone to 25 mg daily.  BMET/BNP today and repeat BMET in 10 days.  - Will plan either cardiac MRI or echo in 12/16 after medication titration, depending on creatinine level (probably will end up being echo).  If EF low, will need ICD. Unlikely to be a good CRT candidate => has IVCD with QRS 126 msec.  - Continue Coreg 3.125 mg bid.  - Hold off on ARB or Entresto at this time with elevated creatinine. Off Bidil with symptomatic low BP.  - Continue digoxin, check level.  2. CKD: Stage III.  BMET today.   Shirley Friar PA-C 08/30/2015  Patient seen with PA, agree with the above note.  Recent CPX with moderate to severe functional impairment.  This seems worse than his described symptoms.  Since decreasing Lasix at last visit, he is more volume overloaded on exam.  - Increase Lasix to 80 mg bid.  - Increase spironolactone to 25 mg daily.  - Echo for ICD in 12/16.  - Mr Miguel Roach's condition is somewhat tenuous.  Will need to follow carefully, may need more advanced therapies in the future.   Loralie Miguel Roach 08/30/2015

## 2015-08-30 NOTE — Patient Instructions (Signed)
Routine lab work today. (bmet dig bnp) Will notify you of abnormal results  INCREASE Lasix to 80mg  twice a day.  INCREASE Spironolactone to 25mg  daily.  LABS: 10 days bmet  FOLLOW UP:3 weeks  Do the following things EVERYDAY: 1) Weigh yourself in the morning before breakfast. Write it down and keep it in a log. 2) Take your medicines as prescribed 3) Eat low salt foods-Limit salt (sodium) to 2000 mg per day.  4) Stay as active as you can everyday 5) Limit all fluids for the day to less than 2 liters

## 2015-08-30 NOTE — Progress Notes (Signed)
STATUS FIRST Informed Consent    Subject Name: Miguel Roach  Subject met inclusion and exclusion criteria.The informed consent form, study requirements and expectations were reviewed with the subject and questions and concerns were addressed prior to the signing of the consent form. The subject verbalized understanding of the trail requirements. The subject agreed to participate in the [place] study and signed the informed consent at 11:34 am on 08-30-15.The informed consent was obtained prior to performance of any protocol-specific procedures for the subject. A copy of the signed informed consent was given to the subject and a copy was placed in the subjects's medical record.    [name] 08/30/15 11:34

## 2015-09-02 ENCOUNTER — Ambulatory Visit (HOSPITAL_BASED_OUTPATIENT_CLINIC_OR_DEPARTMENT_OTHER): Payer: Medicare Other | Attending: Cardiovascular Disease | Admitting: *Deleted

## 2015-09-02 DIAGNOSIS — I493 Ventricular premature depolarization: Secondary | ICD-10-CM | POA: Diagnosis not present

## 2015-09-02 DIAGNOSIS — Z79899 Other long term (current) drug therapy: Secondary | ICD-10-CM | POA: Insufficient documentation

## 2015-09-02 DIAGNOSIS — G4733 Obstructive sleep apnea (adult) (pediatric): Secondary | ICD-10-CM | POA: Insufficient documentation

## 2015-09-02 DIAGNOSIS — G4730 Sleep apnea, unspecified: Secondary | ICD-10-CM | POA: Diagnosis present

## 2015-09-08 ENCOUNTER — Ambulatory Visit (HOSPITAL_COMMUNITY)
Admission: RE | Admit: 2015-09-08 | Discharge: 2015-09-08 | Disposition: A | Discharge status: Home / Self Care | Payer: Medicare Other | Source: Ambulatory Visit | Attending: Internal Medicine | Admitting: Internal Medicine

## 2015-09-08 DIAGNOSIS — I420 Dilated cardiomyopathy: Secondary | ICD-10-CM | POA: Diagnosis not present

## 2015-09-08 LAB — URIC ACID: Uric Acid, Serum: 12.7 mg/dL — ABNORMAL HIGH (ref 4.4–7.6)

## 2015-09-08 LAB — BASIC METABOLIC PANEL
ANION GAP: 8 (ref 5–15)
BUN: 28 mg/dL — ABNORMAL HIGH (ref 6–20)
CALCIUM: 9.7 mg/dL (ref 8.9–10.3)
CO2: 26 mmol/L (ref 22–32)
Chloride: 106 mmol/L (ref 101–111)
Creatinine, Ser: 2.33 mg/dL — ABNORMAL HIGH (ref 0.61–1.24)
GFR, EST AFRICAN AMERICAN: 30 mL/min — AB (ref >60)
GFR, EST NON AFRICAN AMERICAN: 26 mL/min — AB (ref >60)
Glucose, Bld: 114 mg/dL — ABNORMAL HIGH (ref 65–99)
Potassium: 4.4 mmol/L (ref 3.5–5.1)
Sodium: 140 mmol/L (ref 135–145)

## 2015-09-08 LAB — DIGOXIN LEVEL: DIGOXIN LVL: 0.5 ng/mL — AB (ref 0.8–2.0)

## 2015-09-08 IMAGING — US US RENAL
1 series · 14 of 25 positions shown · non-contrast
Comparison: Abdominal ultrasound November 15, 2005

CLINICAL DATA: Chronic renal insufficiency stage III, history of
renal cysts

EXAM:
RENAL / URINARY TRACT ULTRASOUND COMPLETE

[Series 1: us renal · 0.30mm/px · 14 of 45 slices shown]
[im 1/45]
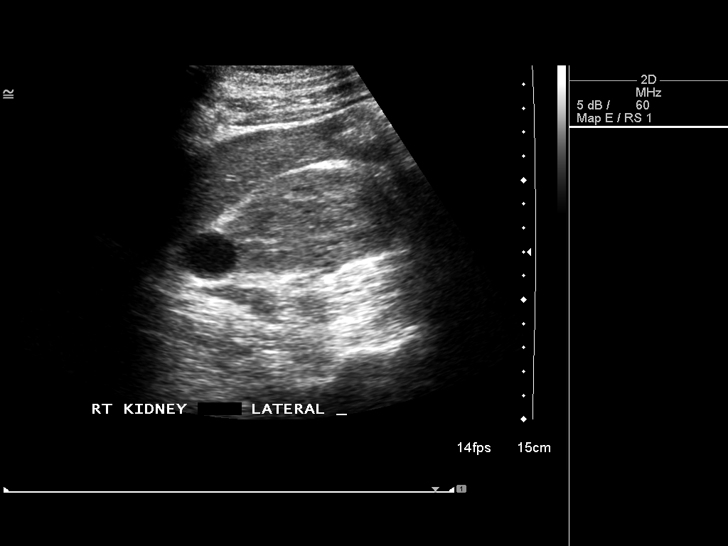
[im 4/45]
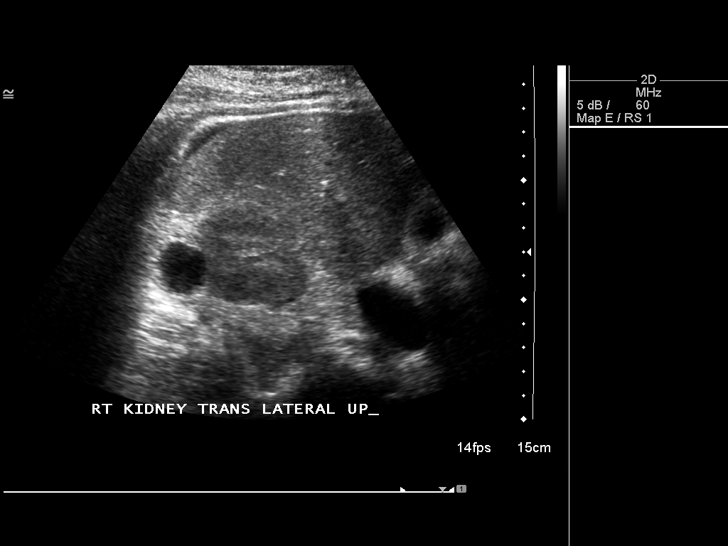
[im 8/45]
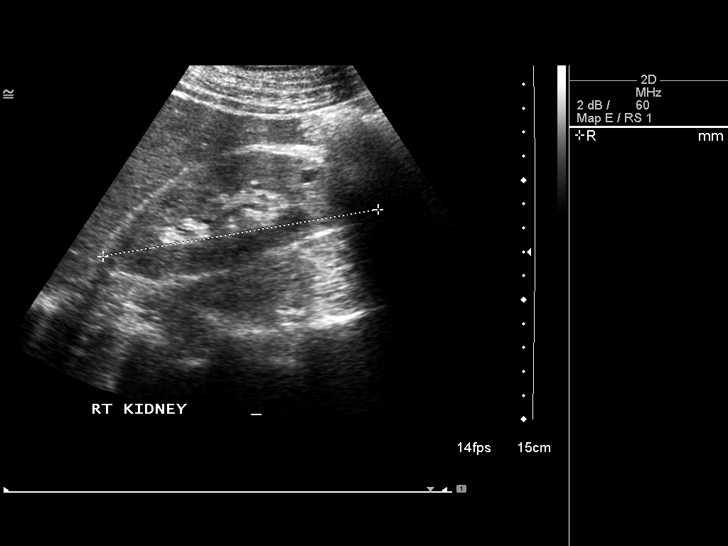
[im 12/45]
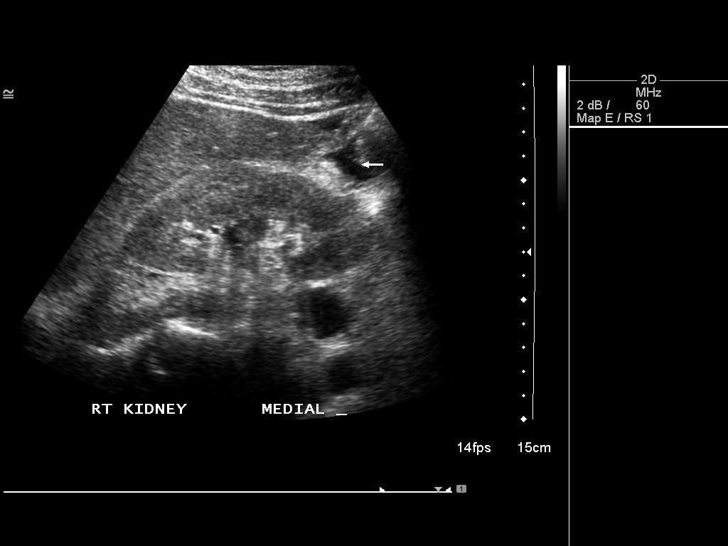
[im 15/45]
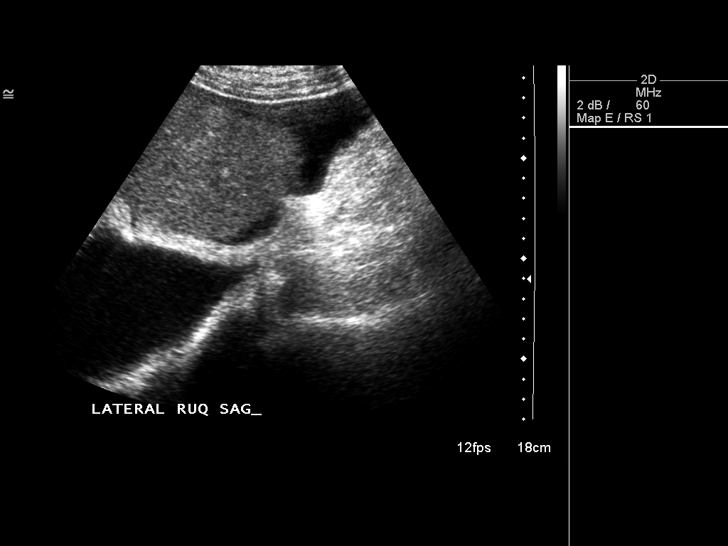
[im 17/45]
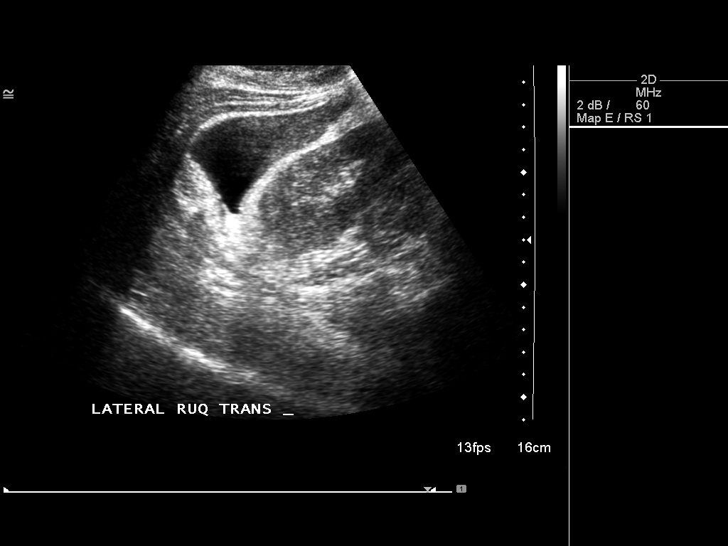
[im 21/45]
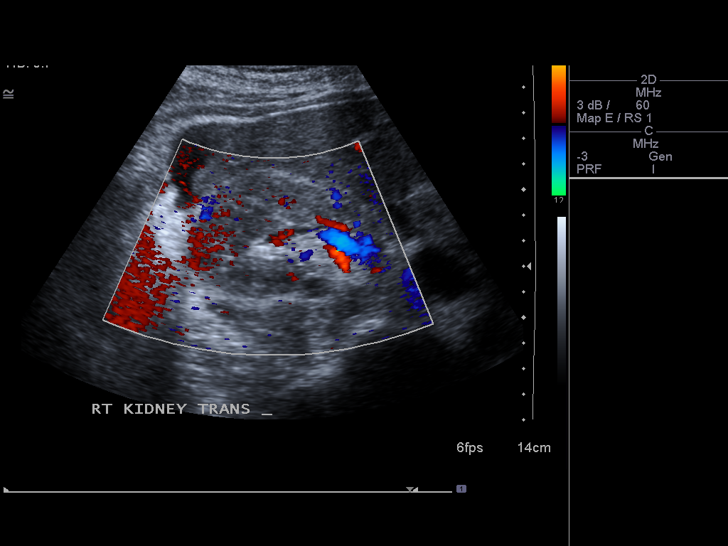
[im 24/45]
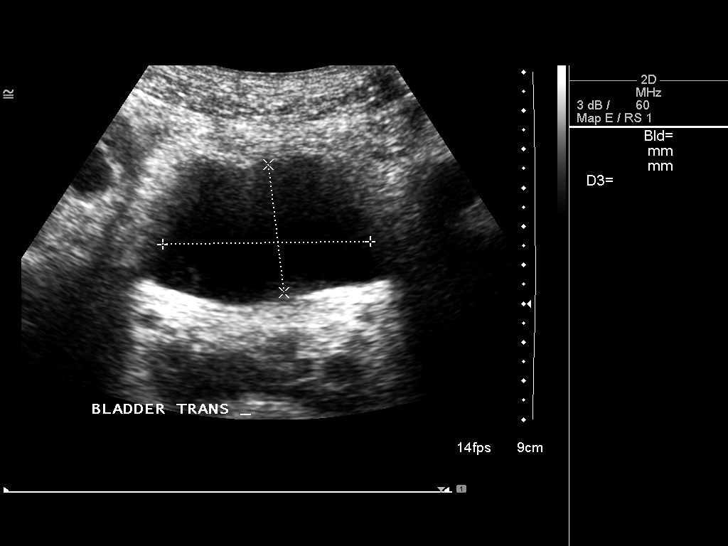
[im 28/45]
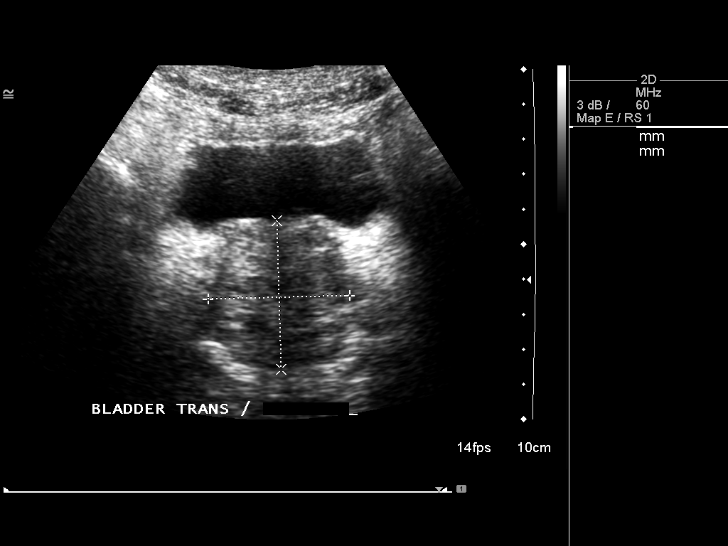
[im 30/45]
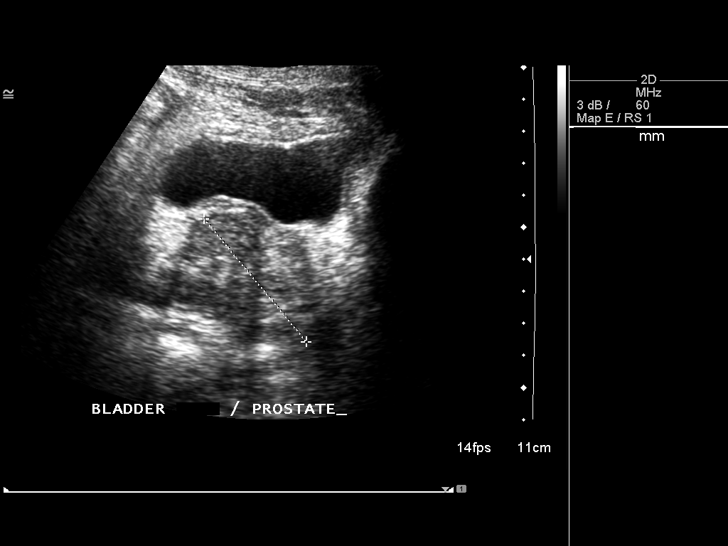
[im 34/45]
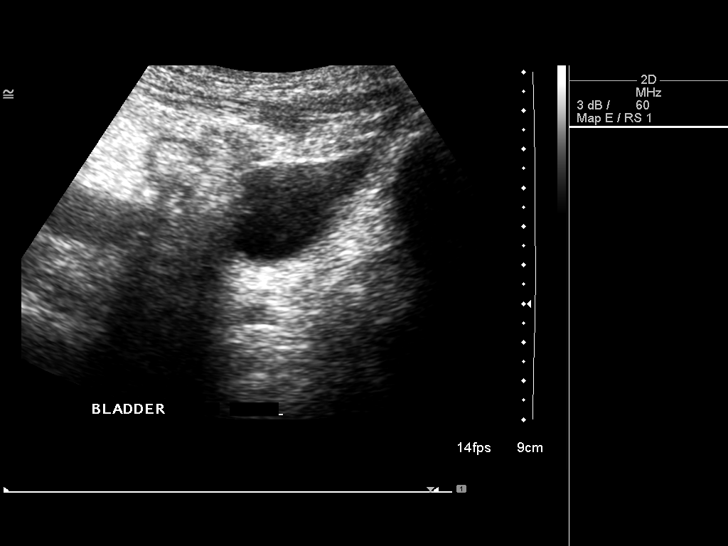
[im 37/45]
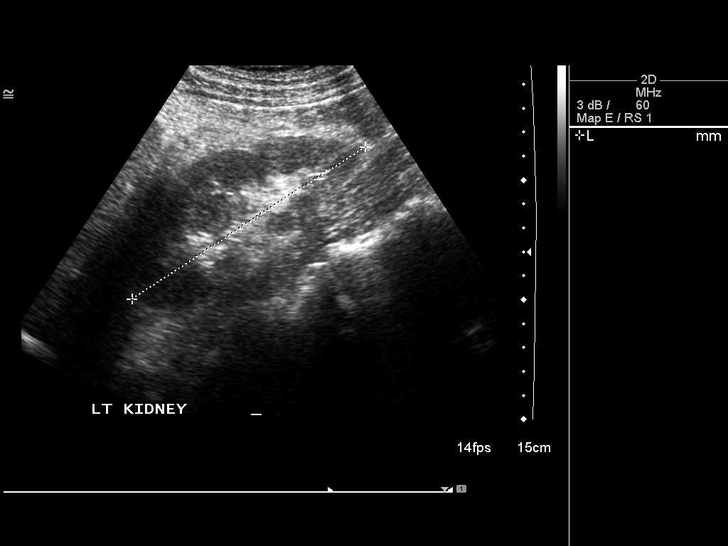
[im 41/45]
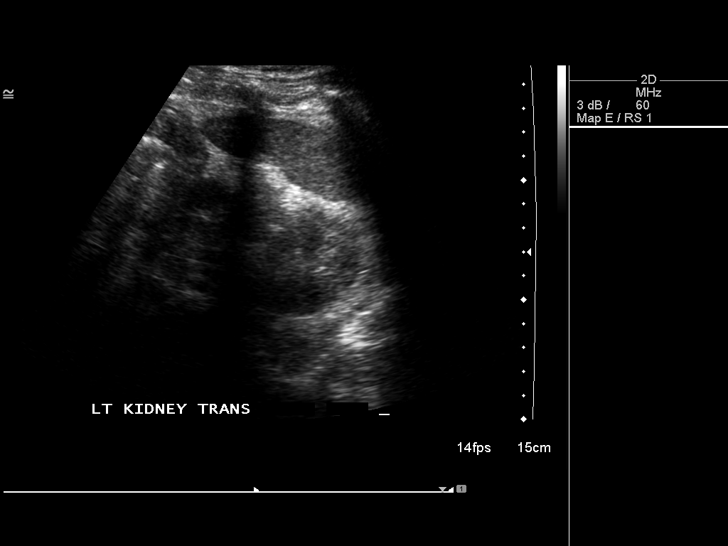
[im 45/45]
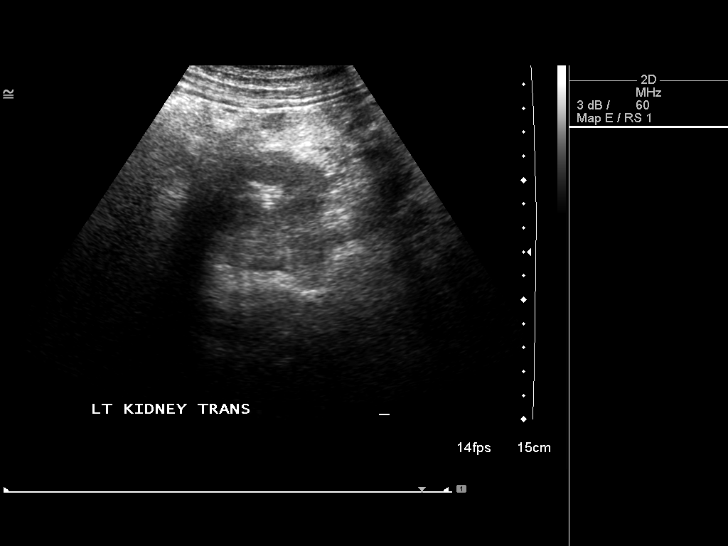

[14 of 25 positions shown; findings below may reference images not displayed]

FINDINGS: Right Kidney:

Length: 11.7 cm. The renal cortical echotexture is equal to that of
the adjacent liver. There is no hydronephrosis. There is an
exophytic upper pole cyst measuring 2.1 cm in greatest dimension.

Left Kidney:

Length: 11.6 cm. The renal cortical echotexture is mildly increased
similar to that on the right. There is no hydronephrosis.

Bladder:

The prostate gland is enlarged and produces a moderate impression
upon the urinary bladder base.

There is ascites and there are bilateral pleural effusions greater
on the right than on the left.
IMPRESSION: 1. Increased renal cortical echotexture bilaterally is consistent
with medical renal disease. There is a simple appearing right upper
pole cyst. There is no hydronephrosis.
2. Enlargement of the prostate gland.
3. Ascites and bilateral pleural effusions.

## 2015-09-08 MED ORDER — COLCHICINE 0.6 MG PO TABS: ORAL_TABLET | ORAL | Status: DC

## 2015-09-08 NOTE — Addendum Note (Signed)
Encounter addended by: Harvie Junior, CMA on: 09/08/2015  9:44 AM<BR>     Documentation filed: Orders

## 2015-09-08 NOTE — Addendum Note (Signed)
Encounter addended by: Harvie Junior, CMA on: 09/08/2015  9:38 AM<BR>     Documentation filed: [employer], Orders

## 2015-09-12 ENCOUNTER — Telehealth: Payer: Self-pay | Admitting: Cardiology

## 2015-09-12 NOTE — Sleep Study (Signed)
   Patient Name: Miguel Roach, Miguel Roach MRN: [id number] Study Date: 09/02/2015 Gender: Male D.O.B: 07/28/1941 Age (years): 49 Referring Provider: Not Available Interpreting Physician: Fransico Him MD, ABSM RPSGT: [place], Holly Height (inches): 71 Weight (lbs): 172 BMI: 24 Neck Size: 17.00  CLINICAL INFORMATION The patient is referred for a BiPAP titration to treat sleep apnea. Date of NPSG, Split Night or HST:05/17/2015  SLEEP STUDY TECHNIQUE As per the AASM Manual for the Scoring of Sleep and Associated Events v2.3 (April 2016) with a hypopnea requiring 4% desaturations. The channels recorded and monitored were frontal, central and occipital EEG, electrooculogram (EOG), submentalis EMG (chin), nasal and oral airflow, thoracic and abdominal wall motion, anterior tibialis EMG, snore microphone, electrocardiogram, and pulse oximetry. Bilevel positive airway pressure (BPAP) was initiated at the beginning of the study and titrated to treat sleep-disordered breathing. MEDICATIONS Medications administered by patient during sleep study : No sleep medicine administered.  Home Medications:  Carvedilol, Colchicine, Digoxin, KCl, Lasix, Spironolactone.  RESPIRATORY PARAMETERS Optimal IPAP Pressure (cm): 10 AHI at Optimal Pressure (/hr)10.1 Optimal EPAP Pressure (cm):6     Overall Minimal O2 (%):93.00  Minimal O2 at Optimal Pressure (%):95.0  SLEEP ARCHITECTURE Start Time:11:10:17 PM Stop Time:5:15:50 AM Total Time (min):365.6  Total Sleep Time (min):173.0 Sleep Latency (min):37.4 Sleep Efficiency (%):47.3 REM Latency (min):305.5  WASO (min): 155.1 Stage N1 (%): 38.44  Stage N2 (%): 56.65  Stage N3 (%): 0.00   Stage R (%): 4.91 Supine (%):90.17  Arousal Index (/hr):12.1      CARDIAC DATA The 2 lead EKG demonstrated sinus rhythm. The mean heart rate was 63.16 beats per minute. Other EKG findings include: PVCs.  LEG MOVEMENT DATA The total Periodic Limb Movements of Sleep (PLMS) were 0. The  PLMS index was 0.00. A PLMS index of <15 is considered normal in adults.  IMPRESSIONS - An optimal PAP pressure was selected for this patient ( 10/6 cm of water) - Central sleep apnea was not noted during this titration (CAI = 2.4/h). - Significant oxygen desaturations were not observed during this titration (min O2 = 93.00%). - No snoring was audible during this study. - 2-lead EKG demonstrated: PVCs - Clinically significant periodic limb movements were not noted during this study. Arousals associated with PLMs were rare.  DIAGNOSIS - Obstructive Sleep Apnea (327.23 [G47.33 ICD-10])  RECOMMENDATIONS - Trial of BiPAP therapy on 10/6 cm H2O with a Medium size Fisher&Paykel Full Face Mask Simplus mask and heated humidification. - Avoid alcohol, sedatives and other CNS depressants that may worsen sleep apnea and disrupt normal sleep architecture. - Sleep hygiene should be reviewed to assess factors that may improve sleep quality. - Weight management and regular exercise should be initiated or continued. - Return to Sleep Center for re-evaluation after 10 weeks of therapy   [place], American Board of Sleep Medicine  ELECTRONICALLY SIGNED ON:  09/12/2015, 7:40 PM [place] PH: [phone]   FX: [phone] [place]

## 2015-09-12 NOTE — Addendum Note (Signed)
Addended by: Sueanne Margarita on: 09/12/2015 07:49 PM   Modules accepted: Orders

## 2015-09-12 NOTE — Telephone Encounter (Signed)
Pt had successful PAP titration. Please setup appointment in 10 weeks. Please let [facility] know that order for PAP is in EPIC.   

## 2015-09-17 NOTE — Telephone Encounter (Signed)
Patient informed of information.   Stated verbal understanding.   Due to timing issue with being able to follow-up, [facility] will contact him in about two weeks to set him up for his equipment.  Once he is set up, we will schedule the 10 week follow-up

## 2015-09-21 ENCOUNTER — Ambulatory Visit (HOSPITAL_COMMUNITY)
Admission: RE | Admit: 2015-09-21 | Discharge: 2015-09-21 | Disposition: A | Discharge status: Home / Self Care | Payer: Medicare Other | Source: Ambulatory Visit | Attending: Cardiology | Admitting: Cardiology

## 2015-09-21 ENCOUNTER — Encounter (HOSPITAL_COMMUNITY): Payer: Self-pay

## 2015-09-21 VITALS — BP 98/56 | HR 62 | Wt 158.0 lb

## 2015-09-21 DIAGNOSIS — G4733 Obstructive sleep apnea (adult) (pediatric): Secondary | ICD-10-CM | POA: Diagnosis not present

## 2015-09-21 DIAGNOSIS — I5022 Chronic systolic (congestive) heart failure: Secondary | ICD-10-CM | POA: Insufficient documentation

## 2015-09-21 DIAGNOSIS — N183 Chronic kidney disease, stage 3 unspecified: Secondary | ICD-10-CM

## 2015-09-21 NOTE — Progress Notes (Signed)
Patient ID: CYRUSS STAUFFER, male   DOB: [birth year], 74 y.o.   MRN: [id number]   Advanced Heart Failure Clinic Note   Patient ID: DONAVON GERHOLD, male   DOB: [birth year], 74 y.o.   MRN: [id number] PCP: Dr. Shelia Media Primary cardiologist: Dr. Percival Spanish HF: Dr. Aundra Dubin   74 yo with history of chronic systolic CHF/nonischemic cardiomyopathy and CKD presents for CHF clinic evaluation. He actually had an echo back in 2010 with EF 40-45% at the time.  He says that this really was not followed up upon by a cardiologist and that he did well for a number of years after that. In 6/16, he was admitted with acute systolic CHF.  Echo showed EF down to 20-25%.  Cardiac cath showed nonobstructive CAD.  His Lasix has been gradually increased.  This has brought his weight down and improved his breathing, but creatinine has risen. He had been on olmesartan but this was stopped because he thought it was causing cough.  He was then put on losartan, but this was stopped with rise in creatinine. Also intolerant to Bidil with orthostatic symptoms.  He was started on digoxin.  He returns today for followup.  At last appointment, I increased his Lasix but creatinine rose.  Therefore, I dropped Lasix back to 80 qam/40 qpm.  No problem walking steadily on flat ground but dyspnea if walks fast or goes up more than 1 flight of steps.  He was able to do the Electric Slide at a wedding reception recently without any dyspnea.  No orthopnea/PND.  No chest pain.  No palpitations. SBP remains in the 90s.  No lightheadedness or syncope.   Labs (6/16): BNP 1954, TSH normal Labs (7/16): K 4.1, creatinine 1.72 Labs (8/16): creatinine 2.3 Labs (9/16): digoxin 0.3, K 4, creatinine 2.14, SPEP negative, UPEP negative Labs (10/16): K 4.6, creatinine 1.62 Labs (11/16): K 4.4, creatinine 2.33, digoxin 0.5 Labs (12/16): creatinine 2.1 (nephrology)  PMH: 1. CKD: Follows with Dr Mercy Messner. 2. Gout 3. Chronic systolic CHF: Nonischemic cardiomyopathy.   EF 40-45% in 2010.  Echo (6/16) with EF 20-25%, severe LV dilation, mild MR, mild AI, moderate LAE.  LHC/RHC (6/16) with nonobstructive CAD; mean RA 2, RV 48/1, PA 47/18, mean PCWP 12, CI 3.2.  CPX (10/16) with peak VO2 13.1 (50% predicted), VE/VCO2 slope 39.3, RER 1.22 => moderate to severe functional limitation.  4. ACEI cough.  5. Pleural effusion: Thoracentesis on right in 8/16.  It was a transudate, likely due to CHF.  6. OSA: To start Bipap.   SH: Married, retired Pharmacist, hospital, never smoked, no ETOH.   FH: Mother with "weak heart." Brother with sickle cell anemia.   ROS: All systems reviewed and negative except as per HPI.   Current Outpatient Prescriptions  Medication Sig Dispense Refill  . carvedilol (COREG) 3.125 MG tablet Take 1 tablet (3.125 mg total) by mouth 2 (two) times daily. 180 tablet 3  . colchicine 0.6 MG tablet Take 2 tablets then take 1 tablet one hour later daily. 60 tablet 2  . digoxin (LANOXIN) 0.125 MG tablet Take 0.5 tablets (0.0625 mg total) by mouth daily. 30 tablet 3  . furosemide (LASIX) 40 MG tablet Take 40 mg by mouth. Take 80mg  in the morning and 40mg  in the evening    . potassium chloride SA (K-DUR,KLOR-CON) 20 MEQ tablet Take 1 tablet (20 mEq total) by mouth 2 (two) times daily. 90 tablet 3  . spironolactone (ALDACTONE) 25 MG tablet Take 1 tablet (25 mg  total) by mouth daily. 30 tablet 3   No current facility-administered medications for this encounter.   BP 98/56 mmHg  Pulse 62  Wt 158 lb (71.668 kg)  SpO2 98%  General: NAD Neck: JVP 7-8 cm, no thyromegaly or lymphadenopathy.  Lungs: CTA, normal effort CV: Nondisplaced PMI.  Heart regular S1/S2 with wide S2 split, no S3/S4, no murmur.  Trace ankle edema bilaterally.  No carotid bruit.  Normal pedal pulses.  Abdomen: Soft, NT, ND, no HSM. +BS Skin: Intact without lesions or rashes.  Neurologic: Alert and oriented x 3.  Psych: Normal affect. Extremities: No clubbing or cyanosis.  HEENT: Normal.    Assessment/Plan: 1. Chronic systolic CHF: Nonischemic cardiomyopathy, NYHA class II symptoms.  CO preserved on RHC in 6/16, but recent CPX showed moderate to severe functional impairment (seems worse than his symptoms would suggest). No significant CAD with coronary angiography in 6/16.  No hx heavy ETOH or drug abuse. No marked HTN.  Negative SPEP/UPEP.  Possible prior myocarditis.  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 On exam, he looks euvolemic.  - Continue Lasix 80 qam/40 qpm.  Recent BMET showed creatinine coming back down.  - Continue spironolactone 25 mg daily.   - He will get a repeat echo this month.  If EF low, will need ICD. Unlikely to be a good CRT candidate => has IVCD with QRS 126 msec.  - Continue Coreg 3.125 mg bid, would consider increasing Coreg at next appointment. .  - Hold off on ARB or Entresto at this time with elevated creatinine. Off Bidil with symptomatic low BP.  - Continue digoxin, level ok in 11/16.  - I will arrange for cardiac rehab.  2. CKD: Stage III.   3. OSA: To start Bipap.  [name] 09/21/2015

## 2015-09-21 NOTE — Patient Instructions (Signed)
Your physician has requested that you have an echocardiogram. Echocardiography is a painless test that uses sound waves to create images of your heart. It provides your doctor with information about the size and shape of your heart and how well your heart's chambers and valves are working. This procedure takes approximately one hour. There are no restrictions for this procedure.  You have been referred to Cardiac Rehab they will call you to reschedule  Your physician recommends that you schedule a follow-up appointment in: 6 weeks

## 2015-09-27 ENCOUNTER — Other Ambulatory Visit: Payer: Self-pay

## 2015-09-27 ENCOUNTER — Ambulatory Visit (HOSPITAL_COMMUNITY): Payer: Medicare Other | Attending: Cardiology

## 2015-09-27 DIAGNOSIS — I517 Cardiomegaly: Secondary | ICD-10-CM | POA: Diagnosis not present

## 2015-09-27 DIAGNOSIS — I10 Essential (primary) hypertension: Secondary | ICD-10-CM | POA: Diagnosis not present

## 2015-09-27 DIAGNOSIS — I071 Rheumatic tricuspid insufficiency: Secondary | ICD-10-CM | POA: Diagnosis not present

## 2015-09-27 DIAGNOSIS — I428 Other cardiomyopathies: Secondary | ICD-10-CM

## 2015-09-27 DIAGNOSIS — I340 Nonrheumatic mitral (valve) insufficiency: Secondary | ICD-10-CM | POA: Insufficient documentation

## 2015-09-27 DIAGNOSIS — I429 Cardiomyopathy, unspecified: Secondary | ICD-10-CM | POA: Insufficient documentation

## 2015-09-27 DIAGNOSIS — I371 Nonrheumatic pulmonary valve insufficiency: Secondary | ICD-10-CM | POA: Diagnosis not present

## 2015-09-27 DIAGNOSIS — I351 Nonrheumatic aortic (valve) insufficiency: Secondary | ICD-10-CM | POA: Diagnosis not present

## 2015-10-21 ENCOUNTER — Encounter (HOSPITAL_COMMUNITY)
Admission: RE | Admit: 2015-10-21 | Discharge: 2015-10-21 | Disposition: A | Discharge status: Home / Self Care | Payer: Medicare Other | Source: Ambulatory Visit | Attending: Cardiology | Admitting: Cardiology

## 2015-10-21 DIAGNOSIS — I509 Heart failure, unspecified: Secondary | ICD-10-CM | POA: Insufficient documentation

## 2015-10-21 NOTE — Progress Notes (Signed)
Cardiac Rehab Medication Review by a Pharmacist  Does the patient  feel that his/her medications are working for him/her?  yes  Has the patient been experiencing any side effects to the medications prescribed?  no  Does the patient measure his/her own blood pressure or blood glucose at home?  yes   Does the patient have any problems obtaining medications due to transportation or finances?   no  Understanding of regimen: excellent Understanding of indications: excellent Potential of compliance: excellent    Pharmacist comments: Patient and wife are very knowledgeable about medication regimen. We discussed the indications for each of his medications and he relayed understanding. They had no further medication related questions.   Trevaun Rendleman C. Lennox Grumbles, PharmD Pharmacy Resident  Pager: [phone] 10/21/2015 9:16 AM

## 2015-10-25 ENCOUNTER — Encounter (HOSPITAL_COMMUNITY)
Admission: RE | Admit: 2015-10-25 | Discharge: 2015-10-25 | Disposition: A | Discharge status: Home / Self Care | Payer: Medicare Other | Source: Ambulatory Visit | Attending: Cardiology | Admitting: Cardiology

## 2015-10-25 NOTE — Progress Notes (Signed)
Pt will be absent from cardiac rehab today due to inclement weather.

## 2015-10-27 ENCOUNTER — Encounter (HOSPITAL_COMMUNITY): Payer: Self-pay

## 2015-10-27 ENCOUNTER — Encounter (HOSPITAL_COMMUNITY)
Admission: RE | Admit: 2015-10-27 | Discharge: 2015-10-27 | Disposition: A | Discharge status: Home / Self Care | Payer: Medicare Other | Source: Ambulatory Visit | Attending: Cardiology | Admitting: Cardiology

## 2015-10-27 DIAGNOSIS — I509 Heart failure, unspecified: Secondary | ICD-10-CM | POA: Diagnosis present

## 2015-10-27 NOTE — Progress Notes (Signed)
Pt started cardiac rehab today.  Pt tolerated light exercise without difficulty. VSS, telemetry-sinus rhythm, first degree AV block, frequent PVC, asymptomatic.  Medication list reconciled.  Pt verbalized compliance with medications and denies barriers to compliance. PSYCHOSOCIAL ASSESSMENT:  PHQ-0. Pt exhibits positive coping skills, hopeful outlook with supportive family, pt has strong faith base, looks for positive and good in all situations. No psychosocial needs identified at this time, no psychosocial interventions necessary.    Pt enjoys going to [employer], watching football, and former Chief Executive Officer.  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   Pt cardiac rehab  goal is  to increase strength and stamina.   Pt encouraged to participate in home exercise in addition to cardiac rehab activities  to increase ability to achieve these goals.   Pt oriented to exercise equipment and routine.  Understanding verbalized.

## 2015-10-29 ENCOUNTER — Encounter (HOSPITAL_COMMUNITY): Payer: Medicare Other

## 2015-11-01 ENCOUNTER — Telehealth (HOSPITAL_COMMUNITY): Payer: Self-pay | Admitting: Internal Medicine

## 2015-11-01 ENCOUNTER — Encounter (HOSPITAL_COMMUNITY): Admission: RE | Admit: 2015-11-01 | Payer: Medicare Other | Source: Ambulatory Visit

## 2015-11-03 ENCOUNTER — Ambulatory Visit (HOSPITAL_COMMUNITY)
Admission: RE | Admit: 2015-11-03 | Discharge: 2015-11-03 | Disposition: A | Discharge status: Home / Self Care | Payer: Medicare Other | Source: Ambulatory Visit | Attending: Cardiology | Admitting: Cardiology

## 2015-11-03 ENCOUNTER — Encounter (HOSPITAL_COMMUNITY)
Admission: RE | Admit: 2015-11-03 | Discharge: 2015-11-03 | Disposition: A | Discharge status: Home / Self Care | Payer: Medicare Other | Source: Ambulatory Visit | Attending: Cardiology | Admitting: Cardiology

## 2015-11-03 VITALS — BP 96/60 | HR 62 | Wt 160.4 lb

## 2015-11-03 DIAGNOSIS — M1000 Idiopathic gout, unspecified site: Secondary | ICD-10-CM | POA: Diagnosis not present

## 2015-11-03 DIAGNOSIS — G4733 Obstructive sleep apnea (adult) (pediatric): Secondary | ICD-10-CM | POA: Insufficient documentation

## 2015-11-03 DIAGNOSIS — I509 Heart failure, unspecified: Secondary | ICD-10-CM | POA: Diagnosis not present

## 2015-11-03 DIAGNOSIS — N183 Chronic kidney disease, stage 3 unspecified: Secondary | ICD-10-CM

## 2015-11-03 DIAGNOSIS — M109 Gout, unspecified: Secondary | ICD-10-CM | POA: Insufficient documentation

## 2015-11-03 DIAGNOSIS — I5022 Chronic systolic (congestive) heart failure: Secondary | ICD-10-CM | POA: Insufficient documentation

## 2015-11-03 LAB — BASIC METABOLIC PANEL
Anion gap: 7 (ref 5–15)
BUN: 28 mg/dL — AB (ref 6–20)
CHLORIDE: 109 mmol/L (ref 101–111)
CO2: 27 mmol/L (ref 22–32)
CREATININE: 2.28 mg/dL — AB (ref 0.61–1.24)
Calcium: 9.6 mg/dL (ref 8.9–10.3)
GFR calc Af Amer: 31 mL/min — ABNORMAL LOW (ref >60)
GFR calc non Af Amer: 27 mL/min — ABNORMAL LOW (ref >60)
GLUCOSE: 81 mg/dL (ref 65–99)
POTASSIUM: 5 mmol/L (ref 3.5–5.1)
SODIUM: 143 mmol/L (ref 135–145)

## 2015-11-03 LAB — DIGOXIN LEVEL: Digoxin Level: 0.6 ng/mL — ABNORMAL LOW (ref 0.8–2.0)

## 2015-11-03 LAB — URIC ACID: Uric Acid, Serum: 10.4 mg/dL — ABNORMAL HIGH (ref 4.4–7.6)

## 2015-11-03 MED ORDER — ISOSORBIDE MONONITRATE ER 30 MG PO TB24: 15.0000 mg | ORAL_TABLET | Freq: Every day | ORAL | Status: DC

## 2015-11-03 MED ORDER — HYDRALAZINE HCL 10 MG PO TABS: 10.0000 mg | ORAL_TABLET | Freq: Three times a day (TID) | ORAL | Status: DC

## 2015-11-03 MED ORDER — ALLOPURINOL 100 MG PO TABS: 200.0000 mg | ORAL_TABLET | Freq: Every day | ORAL | Status: DC

## 2015-11-03 NOTE — Patient Instructions (Signed)
Start Allopurinol 200 mg (2 tabs) daily  Start Hydralazine 10 mg Three times a day   Start Isosorbide 15 mg (1/2 tab) daily  Labs today  You have been referred to Dr Lovena Le to discuss ICD   Your physician recommends that you schedule a follow-up appointment in: 6 weeks

## 2015-11-04 DIAGNOSIS — M109 Gout, unspecified: Secondary | ICD-10-CM | POA: Insufficient documentation

## 2015-11-04 NOTE — Progress Notes (Signed)
Patient ID: Miguel Roach, male   DOB: 01-23-1941, 75 y.o.   MRN: [id number] P  Advanced Heart Failure Clinic Note   Patient ID: Miguel Roach, male   DOB: [birth year], 75 y.o.   MRN: [id number] PCP: Dr. Shelia Media Primary cardiologist: Dr. Percival Spanish HF: Dr. Aundra Dubin   75 yo with history of chronic systolic CHF/nonischemic cardiomyopathy and CKD presents for CHF clinic evaluation. He actually had an echo back in 2010 with EF 40-45% at the time.  He says that this really was not followed up upon by a cardiologist and that he did well for a number of years after that. In 6/16, he was admitted with acute systolic CHF.  Echo showed EF down to 20-25%.  Cardiac cath showed nonobstructive CAD.  His Lasix has been gradually increased.  This has brought his weight down and improved his breathing, but creatinine has risen. He had been on olmesartan but this was stopped because he thought it was causing cough.  He was then put on losartan, but this was stopped with rise in creatinine. Also intolerant to Bidil with orthostatic symptoms.  He was started on digoxin.  He returns today for followup.  Echo in 12/16 showed persistently low EF, 15-20%.  He still feels good generally.  No orthopnea/PND.  No dyspnea walking on flat ground.  Mild dyspnea walking up a hill.  No lightheadedness or syncope. He has started cardiac rehab.  Weight is stable. He has been having more gout flares on the higher dose of diuretic.   Labs (6/16): BNP 1954, TSH normal Labs (7/16): K 4.1, creatinine 1.72 Labs (8/16): creatinine 2.3 Labs (9/16): digoxin 0.3, K 4, creatinine 2.14, SPEP negative, UPEP negative Labs (10/16): K 4.6, creatinine 1.62 Labs (11/16): K 4.4, creatinine 2.33, digoxin 0.5 Labs (12/16): creatinine 2.1 (nephrology)  PMH: 1. CKD: Follows with Dr Mercy Shrum. 2. Gout 3. Chronic systolic CHF: Nonischemic cardiomyopathy.  EF 40-45% in 2010.  Echo (6/16) with EF 20-25%, severe LV dilation, mild MR, mild AI, moderate LAE.   LHC/RHC (6/16) with nonobstructive CAD; mean RA 2, RV 48/1, PA 47/18, mean PCWP 12, CI 3.2.  CPX (10/16) with peak VO2 13.1 (50% predicted), VE/VCO2 slope 39.3, RER 1.22 => moderate to severe functional limitation.  Echo (12/16) with EF 15-20%, severe LV dilation, diffuse hypokinesis, moderate AI, mild MR, normal RV size and systolic function.  4. ACEI cough.  5. Pleural effusion: Thoracentesis on right in 8/16.  It was a transudate, likely due to CHF.  6. OSA: To start Bipap.   SH: Married, retired Pharmacist, hospital, never smoked, no ETOH.   FH: Mother with "weak heart." Brother with sickle cell anemia.   ROS: All systems reviewed and negative except as per HPI.   Current Outpatient Prescriptions  Medication Sig Dispense Refill  . carvedilol (COREG) 3.125 MG tablet Take 1 tablet (3.125 mg total) by mouth 2 (two) times daily. 180 tablet 3  . colchicine 0.6 MG tablet Take 2 tablets then take 1 tablet one hour later daily. 60 tablet 2  . digoxin (LANOXIN) 0.125 MG tablet Take 0.5 tablets (0.0625 mg total) by mouth daily. 30 tablet 3  . furosemide (LASIX) 40 MG tablet Take 40 mg by mouth. Take 80mg  in the morning and 40mg  in the evening    . potassium chloride SA (K-DUR,KLOR-CON) 20 MEQ tablet Take 1 tablet (20 mEq total) by mouth 2 (two) times daily. 90 tablet 3  . spironolactone (ALDACTONE) 25 MG tablet Take 1 tablet (25 mg  total) by mouth daily. 30 tablet 3  . allopurinol (ZYLOPRIM) 100 MG tablet Take 2 tablets (200 mg total) by mouth daily. 60 tablet 6  . hydrALAZINE (APRESOLINE) 10 MG tablet Take 1 tablet (10 mg total) by mouth 3 (three) times daily. 90 tablet 6  . isosorbide mononitrate (IMDUR) 30 MG 24 hr tablet Take 0.5 tablets (15 mg total) by mouth daily. 15 tablet 6   No current facility-administered medications for this encounter.   BP 96/60 mmHg  Pulse 62  Wt 160 lb 6.4 oz (72.757 kg)  SpO2 99%  General: NAD Neck: JVP 7 cm, no thyromegaly or lymphadenopathy.  Lungs: CTA, normal  effort CV: Nondisplaced PMI.  Heart regular S1/S2 with wide S2 split, no S3/S4, no murmur.  No edema.  No carotid bruit.  Normal pedal pulses.  Abdomen: Soft, NT, ND, no HSM. +BS Skin: Intact without lesions or rashes.  Neurologic: Alert and oriented x 3.  Psych: Normal affect. Extremities: No clubbing or cyanosis.  HEENT: Normal.   Assessment/Plan: 1. Chronic systolic CHF: Nonischemic cardiomyopathy, NYHA class II symptoms.  CO preserved on RHC in 6/16, but recent CPX showed moderate to severe functional impairment (seems worse than his symptoms would suggest). No significant CAD with coronary angiography in 6/16.  No hx heavy ETOH or drug abuse. No marked HTN.  Negative SPEP/UPEP.  Possible prior myocarditis.  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 On exam, he looks euvolemic.  - Continue Lasix 80 qam/40 qpm.  BMET today.   - Continue spironolactone 25 mg daily.   - Continue Coreg 3.125 mg bid.  - Hold off on ARB or Entresto at this time with elevated creatinine. Off Bidil with symptomatic low BP.  I am going to try him on low dose hydralazine 10 mg tid along with Imdur 15 mg daily. If he gets significantly lightheaded with this regimen, he should stop it.  - Continue digoxin, level ok in 11/16.  - Continue cardiac rehab.  - We discussed ICD today.  EF has remained persistently low.  He would not be a good CRT candidate.  He has a nonischemic cardiomyopathy and is in his 78s, so benefit from ICD is more limited than if he had an ischemic cardiomyopathy.  Ideally, I would like to do cardiac MRI to assess for LV scar.  However, with elevated creatinine, I could not give him contrast. I will refer him back to Dr Lovena Le for re-evaluation for ICD.  Since I cannot assess scar by cardiac MRI, I think that it would not be unreasonable to place an ICD.  2. CKD: Stage III.  BMET today.  3. OSA: Bipap. 4. Gout: More gout flares with increased diuretics.  Check uric  acid, if elevated will start allopurinol 200 mg daily.   [name] 11/04/2015

## 2015-11-05 ENCOUNTER — Encounter (HOSPITAL_COMMUNITY): Payer: Medicare Other

## 2015-11-08 ENCOUNTER — Encounter (HOSPITAL_COMMUNITY)
Admission: RE | Admit: 2015-11-08 | Discharge: 2015-11-08 | Disposition: A | Discharge status: Home / Self Care | Payer: Medicare Other | Source: Ambulatory Visit | Attending: Cardiology | Admitting: Cardiology

## 2015-11-08 DIAGNOSIS — I509 Heart failure, unspecified: Secondary | ICD-10-CM | POA: Diagnosis not present

## 2015-11-10 ENCOUNTER — Encounter (HOSPITAL_COMMUNITY)
Admission: RE | Admit: 2015-11-10 | Discharge: 2015-11-10 | Disposition: A | Discharge status: Home / Self Care | Payer: Medicare Other | Source: Ambulatory Visit | Attending: Cardiology | Admitting: Cardiology

## 2015-11-10 DIAGNOSIS — I509 Heart failure, unspecified: Secondary | ICD-10-CM | POA: Diagnosis not present

## 2015-11-11 ENCOUNTER — Encounter: Payer: Self-pay | Admitting: Internal Medicine

## 2015-11-11 ENCOUNTER — Other Ambulatory Visit: Payer: Self-pay

## 2015-11-11 ENCOUNTER — Ambulatory Visit (INDEPENDENT_AMBULATORY_CARE_PROVIDER_SITE_OTHER): Payer: Medicare Other | Admitting: Internal Medicine

## 2015-11-11 VITALS — BP 106/58 | HR 68 | Ht 70.0 in | Wt 163.0 lb

## 2015-11-11 DIAGNOSIS — R0600 Dyspnea, unspecified: Secondary | ICD-10-CM

## 2015-11-11 DIAGNOSIS — I5022 Chronic systolic (congestive) heart failure: Secondary | ICD-10-CM | POA: Diagnosis not present

## 2015-11-11 NOTE — Patient Instructions (Signed)
Medication Instructions:  Your physician recommends that you continue on your current medications as directed. Please refer to the Current Medication list given to you today.   Labwork: None ordered   Testing/Procedures: Your physician has recommended that you have a defibrillator inserted. An implantable cardioverter defibrillator (ICD) is a small device that is placed in your chest or, in rare cases, your abdomen. This device uses electrical pulses or shocks to help control life-threatening, irregular heartbeats that could lead the heart to suddenly stop beating (sudden cardiac arrest). Leads are attached to the ICD that goes into your heart. This is done in the hospital and usually requires an overnight stay. Please see the instruction sheet given to you today for more information.    Follow-Up: Your physician recommends that you schedule a follow-up appointment as needed with Dr Lovena Le   Any Other Special Instructions Will Be Listed Below (If Applicable).  Call if you want to proceed with ICD implant.  Dates are: 2/7,2/8,2/14,2/15,2/20,2/28,3/3,3/6,3/8,3/15,3/16,3/20,3/21,3/23,3/24,3/27,3/28     If you need a refill on your cardiac medications before your next appointment, please call your pharmacy.  Cardioverter Defibrillator Implantation An implantable cardioverter defibrillator (ICD) is a small, lightweight, battery-powered device that is placed (implanted) under the skin in the chest or abdomen. Your caregiver may prescribe an ICD if:  You have had an irregular heart rhythm (arrhythmia) that originated in the lower chambers of the heart (ventricles).  Your heart has been damaged by a disease (such as coronary artery disease) or heart condition (such as a heart attack). An ICD consists of a battery that lasts several years, a small computer called a pulse generator, and wires called leads that go into the heart. It is used to detect and correct two dangerous arrhythmias: a  rapid heart rhythm (tachycardia) and an arrhythmia in which the ventricles contract in an uncoordinated way (fibrillation). When an ICD detects tachycardia, it sends an electrical signal to the heart that restores the heartbeat to normal (cardioversion). This signal is usually painless. If cardioversion does not work or if the ICD detects fibrillation, it delivers a small electrical shock to the heart (defibrillation) to restart the heart. The shock may feel like a strong jolt in the chest.ICDs may be programmed to correct other problems. Sometimes, ICDs are programmed to act as another type of implantable device called a pacemaker. Pacemakers are used to treat a slow heartbeat (bradycardia). LET YOUR CAREGIVER KNOW ABOUT:  Any allergies you have.  All medicines you are taking, including vitamins, herbs, eyedrops, and over-the-counter medicines and creams.  Previous problems you or members of your family have had with the use of anesthetics.  Any blood disorders you have had.  Other health problems you have. RISKS AND COMPLICATIONS Generally, the procedure to implant an ICD is safe. However, as with any surgical procedure, complications can occur. Possible complications associated with implanting an ICD include:  Swelling, bleeding, or bruising at the site where the ICD was implanted.  Infection at the site where the ICD was implanted.  A reaction to medicine used during the procedure.  Nerve, heart, or blood vessel damage.  Blood clots. BEFORE THE PROCEDURE  You may need to have blood tests, heart tests, or a chest X-ray done before the day of the procedure.  Ask your caregiver about changing or stopping your regular medicines.  Make plans to have someone drive you home. You may need to stay in the hospital overnight after the procedure.  Stop smoking at least 24 hours  before the procedure.  Take a bath or shower the night before the procedure. You may need to scrub your chest or  abdomen with a special type of soap.  Do not eat or drink before your procedure for as long as directed by your caregiver. Ask if it is okay to take any needed medicine with a small sip of water. PROCEDURE  The procedure to implant an ICD in your chest or abdomen is usually done at a hospital in a room that has a large X-ray machine called a fluoroscope. The machine will be above you during the procedure. It will help your caregiver see your heart during the procedure. Implanting an ICD usually takes 1-3 hours. Before the procedure:   Small monitors will be put on your body. They will be used to check your heart, blood pressure, and oxygen level.  A needle will be put into a vein in your hand or arm. This is called an intravenous (IV) access tube. Fluids and medicine will flow directly into your body through the IV tube.  Your chest or abdomen will be cleaned with a germ-killing (antiseptic) solution. The area may be shaved.  You may be given medicine to help you relax (sedative).  You will be given a medicine called a local anesthetic. This medicine will make the surgical site numb while the ICD is implanted. You will be sleepy but awake during the procedure. After you are numb the procedure will begin. The caregiver will:  Make a small cut (incision). This will make a pocket deep under your skin that will hold the pulse generator.  Guide the leads through a large blood vessel into your heart and attach them to the heart muscles. Depending on the ICD, the leads may go into one ventricle or they may go to both ventricles and into an upper chamber of the heart (atrium).  Test the ICD.  Close the incision with stitches, glue, or staples. AFTER THE PROCEDURE  You may feel pain. Some pain is normal. It may last a few days.  You may stay in a recovery area until the local anesthetic has worn off. Your blood pressure and pulse will be checked often. You will be taken to a room where your heart  will be monitored.  A chest X-ray will be taken. This is done to check that the cardioverter defibrillator is in the right place.  You may stay in the hospital overnight.  A slight bump may be seen over the skin where the ICD was placed. Sometimes, it is possible to feel the ICD under the skin. This is normal.  In the months and years afterward, your caregiver will check the device, the leads, and the battery every few months. Eventually, when the battery is low, the ICD will be replaced.   This information is not intended to replace advice given to you by your health care provider. Make sure you discuss any questions you have with your health care provider.   Document Released: 06/24/2002 Document Revised: 07/23/2013 Document Reviewed: 10/21/2012 Elsevier Interactive Patient Education [employer].

## 2015-11-11 NOTE — Progress Notes (Signed)
HPI [name] is referred today by Dr. Percival Roach for consideration for ICD implant. He is a pleasant 75 yo man who has a h/o LV dysfunction dating back 5-6 years. His EF was 40-45%. He did well until 2 months ago when he began to experience sob, fatigue and weakness, especially with exertion. The patient sought medical attention and ultimately underwent catheterization where he was found to have no obstructive CAD. Echo demonstrated and EF of 30%. He feels poorly and c/o weakness and fatigue. His symptoms worsened with uptitration of his beta blocker. He has mild peripheral edema. No syncope.  Allergies  Allergen Reactions  . Benicar [Olmesartan] Cough     Current Outpatient Prescriptions  Medication Sig Dispense Refill  . allopurinol (ZYLOPRIM) 100 MG tablet Take 2 tablets (200 mg total) by mouth daily. 60 tablet 6  . carvedilol (COREG) 3.125 MG tablet Take 1 tablet (3.125 mg total) by mouth 2 (two) times daily. 180 tablet 3  . colchicine 0.6 MG tablet Take 2 tablets then take 1 tablet one hour later daily. 60 tablet 2  . digoxin (LANOXIN) 0.125 MG tablet Take 0.5 tablets (0.0625 mg total) by mouth daily. 30 tablet 3  . furosemide (LASIX) 40 MG tablet Take 40 mg by mouth. Take 80mg  in the morning and 40mg  in the evening    . hydrALAZINE (APRESOLINE) 10 MG tablet Take 1 tablet (10 mg total) by mouth 3 (three) times daily. 90 tablet 6  . isosorbide mononitrate (IMDUR) 30 MG 24 hr tablet Take 0.5 tablets (15 mg total) by mouth daily. 15 tablet 6  . potassium chloride SA (K-DUR,KLOR-CON) 20 MEQ tablet Take 1 tablet (20 mEq total) by mouth 2 (two) times daily. 90 tablet 3  . spironolactone (ALDACTONE) 25 MG tablet Take 1 tablet (25 mg total) by mouth daily. 30 tablet 3   No current facility-administered medications for this visit.     Past Medical History  Diagnosis Date  . Liver lesion 2007  . Renal cyst 2007  . Diverticulosis   . Internal hemorrhoids   . Adenomatous colon polyp  2011  . Gout   . CRI (chronic renal insufficiency)     creatinine back to normal  . Hypertension   . ED (erectile dysfunction)   . Arthritis   . Anal fistula 2003  . Nonischemic cardiomyopathy ([place])     EF 20-25% by echo 2016, At that time 40% RCA stenosis, distal 35% circumflex stenosis    ROS:   All systems reviewed and negative except as noted in the HPI.   Past Surgical History  Procedure Laterality Date  . Treatment fistula anal  2003    rapair  . Cardiac catheterization N/A 04/16/2015    Procedure: Right/Left Heart Cath and Coronary Angiography;  Surgeon: Belva Crome, MD;  Location: [facility];  Service: Cardiovascular;  Laterality: N/A;     Family History  Problem Relation Age of Onset  . Heart failure Mother 21  . Sickle cell anemia Brother   . Breast cancer Sister   . Diabetes Sister   . Diabetes Father     died at age 50  . Diabetes Sister   . Colon polyps Brother   . Clotting disorder      two daughters (inherited from his first wife)     Social History   Social History  . Marital Status: Married    Spouse Name: N/A  . Number of Children: 4  .  Years of Education: N/A   Occupational History  . Retired Environmental consultant   . Retired Animal nutritionist    Social History Main Topics  . Smoking status: Never Smoker   . Smokeless tobacco: Never Used  . Alcohol Use: No  . Drug Use: No  . Sexual Activity: Not on file   Other Topics Concern  . Not on file   Social History Narrative     BP 106/58 mmHg  Pulse 68  Ht 5\' 10"  (1.778 m)  Wt 163 lb (73.936 kg)  BMI 23.39 kg/m2  Physical Exam:  Chronically ill appearing 75 yo man, NAD HEENT: Unremarkable Neck:  6 cm JVD, no thyromegally Back:  No CVA tenderness Lungs:  Clear with no wheezes HEART:  Regular rate rhythm, no murmurs, no rubs, no clicks Abd:  soft, positive bowel sounds, no organomegally, no rebound, no guarding Ext:  2 plus pulses, no edema, no cyanosis, no clubbing Skin:   No rashes no nodules Neuro:  CN II through XII intact, motor grossly intact  EKG - NSR with IVCD of 128 ms.  Assess/Plan:

## 2015-11-12 ENCOUNTER — Encounter (HOSPITAL_COMMUNITY): Payer: Medicare Other

## 2015-11-12 NOTE — Assessment & Plan Note (Signed)
His symptoms are class 2 and he has severe LV dysfunction. I have discussed the treatment options with the patient and have recommended proceeding with ICD insertion for primary prevention. He is considering his options and will call us if he wishes to proceed with ICD implantation.

## 2015-11-12 NOTE — Assessment & Plan Note (Signed)
This is multifactorial but mostly related to chronic systolic heart failure. He is encouraged to continue his current meds and maintain a sodium diet.

## 2015-11-15 ENCOUNTER — Encounter (HOSPITAL_COMMUNITY)
Admission: RE | Admit: 2015-11-15 | Discharge: 2015-11-15 | Disposition: A | Discharge status: Home / Self Care | Payer: Medicare Other | Source: Ambulatory Visit | Attending: Cardiology | Admitting: Cardiology

## 2015-11-15 ENCOUNTER — Telehealth: Payer: Self-pay | Admitting: Internal Medicine

## 2015-11-15 DIAGNOSIS — I509 Heart failure, unspecified: Secondary | ICD-10-CM | POA: Diagnosis not present

## 2015-11-15 NOTE — Telephone Encounter (Signed)
Miguel Roach is calling back with the date that will be good for him for placing of a defibrillator which will be 11/24/15.

## 2015-11-15 NOTE — Progress Notes (Signed)
QUALITY OF LIFE SCORE REVIEW  Pt completed Quality of Life survey as a participant in Cardiac Rehab. Patient quality of life slightly altered by physical constraints which limits ability to perform as prior to recent cardiac illness.  Pt symptoms began a few months ago and have changed his ability to take care of himself without needing to rest.  However overall pt quality of life scores are very good. Pt does have a strong faith base which gives him confidence and reassurance. Pt is scheduled of for ICD placement 11/17/15.  Pt informed of need for MD clearance prior to returning to exercise program.  Offered emotional support and reassurance.  Will continue to monitor and intervene as necessary.

## 2015-11-15 NOTE — Telephone Encounter (Signed)
Scheduled patient for 11/24/15. Will have labs at the hospital.  NPO after midnight.  Ok to take medications the morning of but hold Furosemide.  Patient aware

## 2015-11-17 ENCOUNTER — Encounter (HOSPITAL_COMMUNITY)
Admission: RE | Admit: 2015-11-17 | Discharge: 2015-11-17 | Disposition: A | Discharge status: Home / Self Care | Payer: Medicare Other | Source: Ambulatory Visit | Attending: Cardiology | Admitting: Cardiology

## 2015-11-17 DIAGNOSIS — I509 Heart failure, unspecified: Secondary | ICD-10-CM | POA: Insufficient documentation

## 2015-11-19 ENCOUNTER — Encounter (HOSPITAL_COMMUNITY): Payer: Medicare Other

## 2015-11-19 ENCOUNTER — Ambulatory Visit (HOSPITAL_COMMUNITY)
Admission: RE | Admit: 2015-11-19 | Discharge: 2015-11-19 | Disposition: A | Discharge status: Home / Self Care | Payer: Medicare Other | Source: Ambulatory Visit | Attending: Cardiology | Admitting: Cardiology

## 2015-11-19 DIAGNOSIS — I5022 Chronic systolic (congestive) heart failure: Secondary | ICD-10-CM | POA: Diagnosis not present

## 2015-11-19 LAB — BASIC METABOLIC PANEL
ANION GAP: 10 (ref 5–15)
BUN: 43 mg/dL — ABNORMAL HIGH (ref 6–20)
CALCIUM: 9.4 mg/dL (ref 8.9–10.3)
CO2: 26 mmol/L (ref 22–32)
Chloride: 108 mmol/L (ref 101–111)
Creatinine, Ser: 2.27 mg/dL — ABNORMAL HIGH (ref 0.61–1.24)
GFR, EST AFRICAN AMERICAN: 31 mL/min — AB (ref >60)
GFR, EST NON AFRICAN AMERICAN: 27 mL/min — AB (ref >60)
Glucose, Bld: 131 mg/dL — ABNORMAL HIGH (ref 65–99)
POTASSIUM: 3.8 mmol/L (ref 3.5–5.1)
Sodium: 144 mmol/L (ref 135–145)

## 2015-11-22 ENCOUNTER — Telehealth: Payer: Self-pay | Admitting: Internal Medicine

## 2015-11-22 ENCOUNTER — Encounter (HOSPITAL_COMMUNITY): Payer: Medicare Other

## 2015-11-22 NOTE — Telephone Encounter (Signed)
New Message  Pt request a call back to discuss medications that he will have to begin to take per Dr. Shelia Media has recommended that he goes back on for gout. He has surgery this Wednesday morning. Request a call back to discuss.    allopurinol (ZYLOPRIM) 100 MG colchicine 0.6 MG tablet Prednisone

## 2015-11-22 NOTE — Telephone Encounter (Signed)
Okay to proceed with procedure as scheduled

## 2015-11-24 ENCOUNTER — Encounter (HOSPITAL_COMMUNITY): Payer: Medicare Other

## 2015-11-24 ENCOUNTER — Ambulatory Visit (HOSPITAL_COMMUNITY)
Admission: RE | Admit: 2015-11-24 | Discharge: 2015-11-25 | Disposition: A | Discharge status: Home / Self Care | Payer: Medicare Other | Source: Ambulatory Visit | Attending: Internal Medicine | Admitting: Internal Medicine

## 2015-11-24 ENCOUNTER — Encounter (HOSPITAL_COMMUNITY)
Admission: RE | Disposition: A | Discharge status: Home / Self Care | Payer: Self-pay | Source: Ambulatory Visit | Attending: Internal Medicine

## 2015-11-24 DIAGNOSIS — N189 Chronic kidney disease, unspecified: Secondary | ICD-10-CM | POA: Diagnosis not present

## 2015-11-24 DIAGNOSIS — M109 Gout, unspecified: Secondary | ICD-10-CM | POA: Diagnosis not present

## 2015-11-24 DIAGNOSIS — I130 Hypertensive heart and chronic kidney disease with heart failure and stage 1 through stage 4 chronic kidney disease, or unspecified chronic kidney disease: Secondary | ICD-10-CM | POA: Diagnosis not present

## 2015-11-24 DIAGNOSIS — I2510 Atherosclerotic heart disease of native coronary artery without angina pectoris: Secondary | ICD-10-CM | POA: Insufficient documentation

## 2015-11-24 DIAGNOSIS — I429 Cardiomyopathy, unspecified: Secondary | ICD-10-CM

## 2015-11-24 DIAGNOSIS — I5022 Chronic systolic (congestive) heart failure: Secondary | ICD-10-CM | POA: Diagnosis present

## 2015-11-24 DIAGNOSIS — Z79899 Other long term (current) drug therapy: Secondary | ICD-10-CM | POA: Diagnosis not present

## 2015-11-24 DIAGNOSIS — K5790 Diverticulosis of intestine, part unspecified, without perforation or abscess without bleeding: Secondary | ICD-10-CM | POA: Insufficient documentation

## 2015-11-24 DIAGNOSIS — I428 Other cardiomyopathies: Secondary | ICD-10-CM | POA: Insufficient documentation

## 2015-11-24 DIAGNOSIS — M1990 Unspecified osteoarthritis, unspecified site: Secondary | ICD-10-CM | POA: Insufficient documentation

## 2015-11-24 DIAGNOSIS — I509 Heart failure, unspecified: Secondary | ICD-10-CM | POA: Diagnosis present

## 2015-11-24 DIAGNOSIS — Z95810 Presence of automatic (implantable) cardiac defibrillator: Secondary | ICD-10-CM

## 2015-11-24 HISTORY — DX: Sleep apnea, unspecified: G47.30

## 2015-11-24 HISTORY — PX: EP IMPLANTABLE DEVICE: SHX172B

## 2015-11-24 HISTORY — DX: Presence of automatic (implantable) cardiac defibrillator: Z95.810

## 2015-11-24 HISTORY — PX: INSERTION OF ICD: SHX6689

## 2015-11-24 HISTORY — DX: Heart failure, unspecified: I50.9

## 2015-11-24 LAB — CBC
HEMATOCRIT: 33.6 % — AB (ref 39.0–52.0)
Hemoglobin: 11.6 g/dL — ABNORMAL LOW (ref 13.0–17.0)
MCH: 28.7 pg (ref 26.0–34.0)
MCHC: 34.5 g/dL (ref 30.0–36.0)
MCV: 83.2 fL (ref 78.0–100.0)
Platelets: DECREASED 10*3/uL (ref 150–400)
RBC: 4.04 MIL/uL — ABNORMAL LOW (ref 4.22–5.81)
RDW: 15.3 % (ref 11.5–15.5)
WBC: 10.8 10*3/uL — ABNORMAL HIGH (ref 4.0–10.5)

## 2015-11-24 LAB — BASIC METABOLIC PANEL
Anion gap: 9 (ref 5–15)
BUN: 41 mg/dL — AB (ref 6–20)
CHLORIDE: 108 mmol/L (ref 101–111)
CO2: 27 mmol/L (ref 22–32)
Calcium: 9.1 mg/dL (ref 8.9–10.3)
Creatinine, Ser: 2.13 mg/dL — ABNORMAL HIGH (ref 0.61–1.24)
GFR calc Af Amer: 33 mL/min — ABNORMAL LOW (ref >60)
GFR, EST NON AFRICAN AMERICAN: 29 mL/min — AB (ref >60)
GLUCOSE: 92 mg/dL (ref 65–99)
POTASSIUM: 4 mmol/L (ref 3.5–5.1)
Sodium: 144 mmol/L (ref 135–145)

## 2015-11-24 LAB — SURGICAL PCR SCREEN
MRSA, PCR: NEGATIVE
Staphylococcus aureus: NEGATIVE

## 2015-11-24 SURGERY — ICD IMPLANT

## 2015-11-24 MED ORDER — FUROSEMIDE 40 MG PO TABS: 40.0000 mg | ORAL_TABLET | Freq: Two times a day (BID) | ORAL | Status: DC

## 2015-11-24 MED ORDER — CEFAZOLIN SODIUM 1-5 GM-% IV SOLN
1.0000 g | Freq: Four times a day (QID) | INTRAVENOUS | Status: AC
  Administered 2015-11-24 – 2015-11-25 (×3): 1 g via INTRAVENOUS
  Filled 2015-11-24 (×4): qty 50

## 2015-11-24 MED ORDER — DIGOXIN 125 MCG PO TABS
0.0625 mg | ORAL_TABLET | Freq: Every day | ORAL | Status: DC
  Administered 2015-11-25: 0.0625 mg via ORAL
  Filled 2015-11-24: qty 1

## 2015-11-24 MED ORDER — ONDANSETRON HCL 4 MG/2ML IJ SOLN: 4.0000 mg | Freq: Four times a day (QID) | INTRAMUSCULAR | Status: DC | PRN

## 2015-11-24 MED ORDER — CARVEDILOL 3.125 MG PO TABS
3.1250 mg | ORAL_TABLET | Freq: Two times a day (BID) | ORAL | Status: DC
  Administered 2015-11-24 – 2015-11-25 (×2): 3.125 mg via ORAL
  Filled 2015-11-24 (×2): qty 1

## 2015-11-24 MED ORDER — MUPIROCIN 2 % EX OINT
TOPICAL_OINTMENT | CUTANEOUS | Status: AC
  Administered 2015-11-24: 1 via TOPICAL
  Filled 2015-11-24: qty 22

## 2015-11-24 MED ORDER — SODIUM CHLORIDE 0.9 % IV SOLN
INTRAVENOUS | Status: DC
  Administered 2015-11-24: 11:00:00 via INTRAVENOUS

## 2015-11-24 MED ORDER — SODIUM CHLORIDE 0.9 % IR SOLN
Status: AC
  Filled 2015-11-24: qty 2

## 2015-11-24 MED ORDER — ISOSORBIDE MONONITRATE ER 30 MG PO TB24
15.0000 mg | ORAL_TABLET | Freq: Every day | ORAL | Status: DC
  Administered 2015-11-25: 15 mg via ORAL
  Filled 2015-11-24: qty 1

## 2015-11-24 MED ORDER — MIDAZOLAM HCL 5 MG/5ML IJ SOLN
INTRAMUSCULAR | Status: DC | PRN
  Administered 2015-11-24 (×6): 1 mg via INTRAVENOUS

## 2015-11-24 MED ORDER — FUROSEMIDE 80 MG PO TABS
80.0000 mg | ORAL_TABLET | Freq: Every day | ORAL | Status: DC
  Administered 2015-11-25: 80 mg via ORAL
  Filled 2015-11-24: qty 1

## 2015-11-24 MED ORDER — HYDRALAZINE HCL 10 MG PO TABS
10.0000 mg | ORAL_TABLET | Freq: Three times a day (TID) | ORAL | Status: DC
  Administered 2015-11-24 – 2015-11-25 (×2): 10 mg via ORAL
  Filled 2015-11-24 (×2): qty 1

## 2015-11-24 MED ORDER — HEPARIN (PORCINE) IN NACL 2-0.9 UNIT/ML-% IJ SOLN
INTRAMUSCULAR | Status: DC | PRN
  Administered 2015-11-24: 15:00:00

## 2015-11-24 MED ORDER — LIDOCAINE HCL (PF) 1 % IJ SOLN
INTRAMUSCULAR | Status: AC
  Filled 2015-11-24: qty 60

## 2015-11-24 MED ORDER — COLCHICINE 0.6 MG PO TABS: 1.2000 mg | ORAL_TABLET | Freq: Every day | ORAL | Status: DC | PRN

## 2015-11-24 MED ORDER — LIDOCAINE HCL (PF) 1 % IJ SOLN
INTRAMUSCULAR | Status: DC | PRN
  Administered 2015-11-24: 31 mL via INTRADERMAL

## 2015-11-24 MED ORDER — CEFAZOLIN SODIUM-DEXTROSE 2-3 GM-% IV SOLR
2.0000 g | INTRAVENOUS | Status: AC
  Administered 2015-11-24: 2 g via INTRAVENOUS

## 2015-11-24 MED ORDER — SPIRONOLACTONE 25 MG PO TABS
25.0000 mg | ORAL_TABLET | Freq: Every day | ORAL | Status: DC
Start: 2015-11-24 — End: 2015-11-25
  Administered 2015-11-25: 25 mg via ORAL
  Filled 2015-11-24: qty 1

## 2015-11-24 MED ORDER — CEFAZOLIN SODIUM-DEXTROSE 2-3 GM-% IV SOLR
INTRAVENOUS | Status: AC
  Filled 2015-11-24: qty 50

## 2015-11-24 MED ORDER — SODIUM CHLORIDE 0.9 % IR SOLN
80.0000 mg | Status: AC
  Administered 2015-11-24: 80 mg

## 2015-11-24 MED ORDER — CHLORHEXIDINE GLUCONATE 4 % EX LIQD: 60.0000 mL | Freq: Once | CUTANEOUS | Status: DC

## 2015-11-24 MED ORDER — MIDAZOLAM HCL 5 MG/5ML IJ SOLN
INTRAMUSCULAR | Status: AC
  Filled 2015-11-24: qty 5

## 2015-11-24 MED ORDER — FUROSEMIDE 40 MG PO TABS
40.0000 mg | ORAL_TABLET | Freq: Every day | ORAL | Status: DC
  Administered 2015-11-24: 40 mg via ORAL
  Filled 2015-11-24: qty 1

## 2015-11-24 MED ORDER — FENTANYL CITRATE (PF) 100 MCG/2ML IJ SOLN
INTRAMUSCULAR | Status: AC
  Filled 2015-11-24: qty 2

## 2015-11-24 MED ORDER — ALLOPURINOL 100 MG PO TABS
200.0000 mg | ORAL_TABLET | Freq: Every day | ORAL | Status: DC
  Administered 2015-11-24 – 2015-11-25 (×2): 200 mg via ORAL
  Filled 2015-11-24 (×2): qty 2

## 2015-11-24 MED ORDER — ACETAMINOPHEN 325 MG PO TABS
325.0000 mg | ORAL_TABLET | ORAL | Status: DC | PRN
  Administered 2015-11-24: 650 mg via ORAL
  Filled 2015-11-24: qty 2

## 2015-11-24 MED ORDER — MUPIROCIN 2 % EX OINT
1.0000 "application " | TOPICAL_OINTMENT | Freq: Once | CUTANEOUS | Status: AC
  Administered 2015-11-24: 1 via TOPICAL

## 2015-11-24 MED ORDER — HEPARIN (PORCINE) IN NACL 2-0.9 UNIT/ML-% IJ SOLN
INTRAMUSCULAR | Status: AC
  Filled 2015-11-24: qty 500

## 2015-11-24 MED ORDER — FENTANYL CITRATE (PF) 100 MCG/2ML IJ SOLN
INTRAMUSCULAR | Status: DC | PRN
  Administered 2015-11-24 (×2): 12.5 ug via INTRAVENOUS
  Administered 2015-11-24: 25 ug via INTRAVENOUS
  Administered 2015-11-24: 12.5 ug via INTRAVENOUS

## 2015-11-24 SURGICAL SUPPLY — 8 items
CABLE SURGICAL S-101-97-12 (CABLE) ×2 IMPLANT
ICD VISIA AF VR DVAB1D1 (ICD Generator) IMPLANT
LEAD SPRINT QUAT SEC 6935-65CM (Lead) ×2 IMPLANT
PAD DEFIB LIFELINK (PAD) ×2 IMPLANT
SHEATH CLASSIC 9F (SHEATH) ×2 IMPLANT
SHEATH CLASSIC 9F 25CM (SHEATH) ×2 IMPLANT
TRAY PACEMAKER INSERTION (PACKS) ×2 IMPLANT
VISIA AF VR DVAB1D1 (ICD Generator) ×3 IMPLANT

## 2015-11-24 NOTE — H&P (View-Only) (Signed)
HPI Miguel Roach is referred today by Dr. Percival Spanish for consideration for ICD implant. He is a pleasant 75 yo man who has a h/o LV dysfunction dating back 5-6 years. His EF was 40-45%. He did well until 2 months ago when he began to experience sob, fatigue and weakness, especially with exertion. The patient sought medical attention and ultimately underwent catheterization where he was found to have no obstructive CAD. Echo demonstrated and EF of 30%. He feels poorly and c/o weakness and fatigue. His symptoms worsened with uptitration of his beta blocker. He has mild peripheral edema. No syncope.  Allergies  Allergen Reactions  . Benicar [Olmesartan] Cough     Current Outpatient Prescriptions  Medication Sig Dispense Refill  . allopurinol (ZYLOPRIM) 100 MG tablet Take 2 tablets (200 mg total) by mouth daily. 60 tablet 6  . carvedilol (COREG) 3.125 MG tablet Take 1 tablet (3.125 mg total) by mouth 2 (two) times daily. 180 tablet 3  . colchicine 0.6 MG tablet Take 2 tablets then take 1 tablet one hour later daily. 60 tablet 2  . digoxin (LANOXIN) 0.125 MG tablet Take 0.5 tablets (0.0625 mg total) by mouth daily. 30 tablet 3  . furosemide (LASIX) 40 MG tablet Take 40 mg by mouth. Take 80mg  in the morning and 40mg  in the evening    . hydrALAZINE (APRESOLINE) 10 MG tablet Take 1 tablet (10 mg total) by mouth 3 (three) times daily. 90 tablet 6  . isosorbide mononitrate (IMDUR) 30 MG 24 hr tablet Take 0.5 tablets (15 mg total) by mouth daily. 15 tablet 6  . potassium chloride SA (K-DUR,KLOR-CON) 20 MEQ tablet Take 1 tablet (20 mEq total) by mouth 2 (two) times daily. 90 tablet 3  . spironolactone (ALDACTONE) 25 MG tablet Take 1 tablet (25 mg total) by mouth daily. 30 tablet 3   No current facility-administered medications for this visit.     Past Medical History  Diagnosis Date  . Liver lesion 2007  . Renal cyst 2007  . Diverticulosis   . Internal hemorrhoids   . Adenomatous colon polyp  2011  . Gout   . CRI (chronic renal insufficiency)     creatinine back to normal  . Hypertension   . ED (erectile dysfunction)   . Arthritis   . Anal fistula 2003  . Nonischemic cardiomyopathy ([place])     EF 20-25% by echo 2016, At that time 40% RCA stenosis, distal 35% circumflex stenosis    ROS:   All systems reviewed and negative except as noted in the HPI.   Past Surgical History  Procedure Laterality Date  . Treatment fistula anal  2003    rapair  . Cardiac catheterization N/A 04/16/2015    Procedure: Right/Left Heart Cath and Coronary Angiography;  Surgeon: Belva Crome, MD;  Location: [facility];  Service: Cardiovascular;  Laterality: N/A;     Family History  Problem Relation Age of Onset  . Heart failure Mother 40  . Sickle cell anemia Brother   . Breast cancer Sister   . Diabetes Sister   . Diabetes Father     died at age 38  . Diabetes Sister   . Colon polyps Brother   . Clotting disorder      two daughters (inherited from his first wife)     Social History   Social History  . Marital Status: Married    Spouse Name: N/A  . Number of Children: 4  .  Years of Education: N/A   Occupational History  . Retired Environmental consultant   . Retired Animal nutritionist    Social History Main Topics  . Smoking status: Never Smoker   . Smokeless tobacco: Never Used  . Alcohol Use: No  . Drug Use: No  . Sexual Activity: Not on file   Other Topics Concern  . Not on file   Social History Narrative     BP 106/58 mmHg  Pulse 68  Ht 5\' 10"  (1.778 m)  Wt 163 lb (73.936 kg)  BMI 23.39 kg/m2  Physical Exam:  Chronically ill appearing 75 yo man, NAD HEENT: Unremarkable Neck:  6 cm JVD, no thyromegally Back:  No CVA tenderness Lungs:  Clear with no wheezes HEART:  Regular rate rhythm, no murmurs, no rubs, no clicks Abd:  soft, positive bowel sounds, no organomegally, no rebound, no guarding Ext:  2 plus pulses, no edema, no cyanosis, no clubbing Skin:   No rashes no nodules Neuro:  CN II through XII intact, motor grossly intact  EKG - NSR with IVCD of 128 ms.  Assess/Plan:

## 2015-11-24 NOTE — Discharge Instructions (Signed)
° ° °  Supplemental Discharge Instructions for  Pacemaker/Defibrillator Patients  Activity No heavy lifting or vigorous activity with your left/right arm for 6 to 8 weeks.  Do not raise your left/right arm above your head for one week.  Gradually raise your affected arm as drawn below.           __      11/28/15                     11/29/15                     11/30/15                12/01/15  NO DRIVING for  1 week   ; you may begin driving on   [id number]  .  WOUND CARE - Keep the wound area clean and dry.  Do not get this area wet for one week. No showers for one week; you may shower on  12/01/15   . - The tape/steri-strips on your wound will fall off; do not pull them off.  No bandage is needed on the site.  DO  NOT apply any creams, oils, or ointments to the wound area. - If you notice any drainage or discharge from the wound, any swelling or bruising at the site, or you develop a fever > 101? F after you are discharged home, call the office at once.  Special Instructions - You are still able to use cellular telephones; use the ear opposite the side where you have your pacemaker/defibrillator.  Avoid carrying your cellular phone near your device. - When traveling through airports, show security personnel your identification card to avoid being screened in the metal detectors.  Ask the security personnel to use the hand wand. - Avoid arc welding equipment, MRI testing (magnetic resonance imaging), TENS units (transcutaneous nerve stimulators).  Call the office for questions about other devices. - Avoid electrical appliances that are in poor condition or are not properly grounded. - Microwave ovens are safe to be near or to operate.  Additional information for defibrillator patients should your device go off: - If your device goes off ONCE and you feel fine afterward, notify the device clinic nurses. - If your device goes off ONCE and you do not feel well afterward, call 911. - If your device  goes off TWICE, call 911. - If your device goes off THREE times in one day, call 911.  DO NOT DRIVE YOURSELF OR A FAMILY MEMBER WITH A DEFIBRILLATOR TO THE HOSPITAL--CALL 911.

## 2015-11-24 NOTE — Interval H&P Note (Signed)
History and Physical Interval Note:  11/24/2015 1:59 PM  Miguel Roach  has presented today for surgery, with the diagnosis of chf  The various methods of treatment have been discussed with the patient and family. After consideration of risks, benefits and other options for treatment, the patient has consented to  Procedure(s): ICD Implant (N/A) as a surgical intervention .  The patient's history has been reviewed, patient examined, no change in status, stable for surgery.  I have reviewed the patient's chart and labs.  Questions were answered to the patient's satisfaction.     Cristopher Peru

## 2015-11-24 NOTE — H&P (Signed)
  ICD Criteria  Current LVEF:20%. Within 12 months prior to implant: Yes   Heart failure history: Yes, Class II  Cardiomyopathy history: Yes, Non-Ischemic Cardiomyopathy.  Atrial Fibrillation/Atrial Flutter: No.  Ventricular tachycardia history: No.  Cardiac arrest history: No.  History of syndromes with risk of sudden death: No.  Previous ICD: No.  Current ICD indication: Primary  PPM indication: No.   Class I or II Bradycardia indication present: No  Beta Blocker therapy for 3 or more months: Yes, prescribed.   Ace Inhibitor/ARB therapy for 3 or more months: No, medical reason.

## 2015-11-24 NOTE — Discharge Summary (Signed)
ELECTROPHYSIOLOGY PROCEDURE DISCHARGE SUMMARY    Patient ID: Miguel Roach,  MRN: [id number], DOB/AGE: June 26, 1941 75 y.o.  Admit date: 11/24/2015 Discharge date: 11/25/2015  Primary Care Physician: Horatio Pel, MD Primary Cardiologist: Aundra Dubin Electrophysiologist: Lovena Le  Primary Discharge Diagnosis:  Non ischemic cardiomyopathy status post ICD implant this admission  Secondary Discharge Diagnosis:  1.  Hypertension 2.  Diverticulosis 3.  CKD   Allergies  Allergen Reactions  . Benicar [Olmesartan] Cough     Procedures This Admission:  1.  Implantation of a MDT single chamber ICD on 11/24/15 by Dr Lovena Le.  The patient received a MDT model number Visia AF ICD with model number [id number] right ventricular lead.  DFT's were deferred at time of implant.  There were no immediate post procedure complications. 2.  CXR on 11/25/15 demonstrated no pneumothorax status post device implantation.   Brief HPI: Miguel Roach is a 75 y.o. male was referred to electrophysiology in the outpatient setting for consideration of ICD implantation.  Past medical history includes non ischemic cardiomyopathy and chronic systolic heart failure.  The patient has persistent LV dysfunction despite guideline directed therapy.  Risks, benefits, and alternatives to ICD implantation were reviewed with the patient who wished to proceed.   Hospital Course:  The patient was admitted and underwent implantation of a MDT single chamber ICD with details as outlined above. He was monitored on telemetry overnight which demonstrated NSR.  Left chest was without hematoma or ecchymosis.  The device was interrogated and found to be functioning normally.  CXR was obtained and demonstrated no pneumothorax status post device implantation.  Wound care, arm mobility, and restrictions were reviewed with the patient.  The patient was examined and considered stable for discharge to home.   The patient's discharge medications  include a beta blocker (Coreg). He is intolerant of ACE-I with cough. No ARB 2/2 with recent elevated creatinine.   Physical Exam: Filed Vitals:   11/24/15 1915 11/24/15 1945 11/24/15 2113 11/25/15 0500  BP: 110/66 120/73 107/69 127/85  Pulse: 66 68 72 59  Temp:   98 F (36.7 C) 98.6 F (37 C)  TempSrc:   Oral Oral  Resp: 19 21 18 17   Height:      Weight:    161 lb 8 oz (73.256 kg)  SpO2: 99% 99% 100% 99%    GEN- The patient is well appearing, alert and oriented x 3 today.   HEENT: normocephalic, atraumatic; sclera clear, conjunctiva pink; hearing intact; oropharynx clear; neck supple, no JVP Lymph- no cervical lymphadenopathy Lungs- Clear to ausculation bilaterally, normal work of breathing.  No wheezes, rales, rhonchi Heart- Regular rate and rhythm, no murmurs, rubs or gallops, PMI not laterally displaced GI- soft, non-tender, non-distended, bowel sounds present, no hepatosplenomegaly Extremities- no clubbing, cyanosis, or edema; DP/PT/radial pulses 2+ bilaterally MS- no significant deformity or atrophy Skin- warm and dry, no rash or lesion, left chest without hematoma/ecchymosis Psych- euthymic mood, full affect Neuro- strength and sensation are intact   Labs:   Lab Results  Component Value Date   WBC 10.8* 11/24/2015   HGB 11.6* 11/24/2015   HCT 33.6* 11/24/2015   MCV 83.2 11/24/2015   PLT  11/24/2015    PLATELET CLUMPS NOTED ON SMEAR, COUNT APPEARS DECREASED     Recent Labs Lab 11/24/15 1115  NA 144  K 4.0  CL 108  CO2 27  BUN 41*  CREATININE 2.13*  CALCIUM 9.1  GLUCOSE 92    Discharge Medications:  Medication List    ASK your doctor about these medications        allopurinol 100 MG tablet  Commonly known as:  ZYLOPRIM  Take 2 tablets (200 mg total) by mouth daily.     carvedilol 3.125 MG tablet  Commonly known as:  COREG  Take 1 tablet (3.125 mg total) by mouth 2 (two) times daily.     colchicine 0.6 MG tablet  Take 2 tablets then take 1  tablet one hour later daily.     digoxin 0.125 MG tablet  Commonly known as:  LANOXIN  Take 0.5 tablets (0.0625 mg total) by mouth daily.     furosemide 40 MG tablet  Commonly known as:  LASIX  Take 40-80 mg by mouth 2 (two) times daily. Take 80mg  in the morning and 40mg  in the evening     hydrALAZINE 10 MG tablet  Commonly known as:  APRESOLINE  Take 1 tablet (10 mg total) by mouth 3 (three) times daily.     isosorbide mononitrate 30 MG 24 hr tablet  Commonly known as:  IMDUR  Take 0.5 tablets (15 mg total) by mouth daily.     potassium chloride SA 20 MEQ tablet  Commonly known as:  K-DUR,KLOR-CON  Take 1 tablet (20 mEq total) by mouth 2 (two) times daily.     PREDNISONE PO  Take 4 mg by mouth.     spironolactone 25 MG tablet  Commonly known as:  ALDACTONE  Take 1 tablet (25 mg total) by mouth daily.        Disposition:   Follow-up Information    Follow up with [facility] On 12/09/2015.   Specialty:  Cardiology   Why:  at 12Noon for wound check    Contact information:   [address] [place] [phone]      Follow up with Cristopher Peru, MD On 02/25/2016.   Specialty:  Cardiology   Why:  at 9:45AM    Contact information:   [address] [place] [address] [phone]       Duration of Discharge Encounter: Greater than 30 minutes including physician time.  Signed, Chanetta Marshall, NP 11/25/2015 6:33 AM   EP Attending  Patient seen and examined. Agree with the findings as noted above. He is doing well this morning and will be discharged with usual followup. His device has been interogated and found to be working normally.  [name].D.

## 2015-11-25 ENCOUNTER — Ambulatory Visit (HOSPITAL_COMMUNITY): Payer: Medicare Other

## 2015-11-25 ENCOUNTER — Encounter (HOSPITAL_COMMUNITY): Payer: Self-pay | Admitting: General Practice

## 2015-11-25 DIAGNOSIS — I429 Cardiomyopathy, unspecified: Secondary | ICD-10-CM | POA: Diagnosis not present

## 2015-11-25 DIAGNOSIS — N189 Chronic kidney disease, unspecified: Secondary | ICD-10-CM | POA: Diagnosis not present

## 2015-11-25 DIAGNOSIS — I5022 Chronic systolic (congestive) heart failure: Secondary | ICD-10-CM | POA: Diagnosis not present

## 2015-11-25 DIAGNOSIS — I130 Hypertensive heart and chronic kidney disease with heart failure and stage 1 through stage 4 chronic kidney disease, or unspecified chronic kidney disease: Secondary | ICD-10-CM | POA: Diagnosis not present

## 2015-11-25 DIAGNOSIS — I428 Other cardiomyopathies: Secondary | ICD-10-CM | POA: Diagnosis not present

## 2015-11-25 MED FILL — Gentamicin Sulfate Inj 40 MG/ML: INTRAMUSCULAR | Qty: 2 | Status: AC

## 2015-11-25 MED FILL — Sodium Chloride Irrigation Soln 0.9%: Qty: 500 | Status: AC

## 2015-11-25 NOTE — Plan of Care (Signed)
Problem: Phase III Progression Outcomes Goal: Limited arm movement per orders Outcome: Progressing Patient left upper extremity in sling throughout this shift.  RN educated patient on left arm restrictions.  Patient stated understanding.

## 2015-11-25 NOTE — Plan of Care (Signed)
Problem: Discharge Progression Outcomes Goal: Site without bleeding, hematoma, S/S infection Outcome: Completed/Met Date Met:  11/25/15 Site has old drainage marked from last night Goal: Discharge plan in place and appropriate Outcome: Completed/Met Date Met:  11/25/15 Pt will be discharged home today  Goal: Pain controlled with appropriate interventions Outcome: Completed/Met Date Met:  11/25/15 Pt remains free from pain at this time Goal: Tolerating diet Outcome: Completed/Met Date Met:  11/25/15 Pt able to tolerate current diet with no difficulties  Goal: Activity appropriate for discharge plan Outcome: Completed/Met Date Met:  11/25/15 Pt able to ambulate and activity adequate for discharge  Problem: Education: Goal: Knowledge of [place] Education information/materials will improve Outcome: Completed/Met Date Met:  11/25/15 Pt received education throughout entire hospitalization

## 2015-11-26 ENCOUNTER — Encounter (HOSPITAL_COMMUNITY): Payer: Medicare Other

## 2015-11-29 ENCOUNTER — Encounter (HOSPITAL_COMMUNITY): Payer: Medicare Other

## 2015-12-01 ENCOUNTER — Telehealth (HOSPITAL_COMMUNITY): Payer: Self-pay | Admitting: Cardiac Rehabilitation

## 2015-12-01 ENCOUNTER — Encounter (HOSPITAL_COMMUNITY): Admission: RE | Admit: 2015-12-01 | Payer: Medicare Other | Source: Ambulatory Visit

## 2015-12-01 NOTE — Telephone Encounter (Signed)
pc to pt to assess healing from ICD implant. Pt states he is home resting, following MD discharge instructions. Pt advised to not return to cardiac rehab until given MD clearance.  inbasket sent to Dr. Lovena Le.  Pt has upcoming device clinic appt 12/09/15.  Pt verbalized understanding.

## 2015-12-03 ENCOUNTER — Telehealth (HOSPITAL_COMMUNITY): Payer: Self-pay | Admitting: Cardiac Rehabilitation

## 2015-12-03 ENCOUNTER — Encounter (HOSPITAL_COMMUNITY): Payer: Medicare Other

## 2015-12-03 NOTE — Telephone Encounter (Signed)
-----   Message from Evans Lance, MD sent at 12/03/2015 10:47 AM EST ----- Regarding: RE: cardiac rehjab Any time is fine. No restrictions. GT ----- Message -----    From: Lowell Guitar, RN    Sent: 12/01/2015  10:33 AM      To: Evans Lance, MD Subject: cardiac rehjab                                 Dear Dr. Lovena Le,   Miguel Roach had ICD implant 11/24/15.  When should he return to cardiac rehab?  Are there any activity restrictions?  Thank you, Andi Hence, RN, BSN Cardiac Pulmonary Rehab

## 2015-12-06 ENCOUNTER — Encounter (HOSPITAL_COMMUNITY): Payer: Medicare Other

## 2015-12-08 ENCOUNTER — Encounter (HOSPITAL_COMMUNITY): Payer: Medicare Other

## 2015-12-09 ENCOUNTER — Encounter: Payer: Self-pay | Admitting: Physician Assistant

## 2015-12-09 ENCOUNTER — Encounter: Payer: Self-pay | Admitting: Internal Medicine

## 2015-12-09 ENCOUNTER — Ambulatory Visit (INDEPENDENT_AMBULATORY_CARE_PROVIDER_SITE_OTHER): Payer: Medicare Other | Admitting: Physician Assistant

## 2015-12-09 VITALS — BP 118/72 | HR 69 | Ht 71.0 in | Wt 163.0 lb

## 2015-12-09 DIAGNOSIS — I428 Other cardiomyopathies: Secondary | ICD-10-CM

## 2015-12-09 DIAGNOSIS — Z5189 Encounter for other specified aftercare: Secondary | ICD-10-CM

## 2015-12-09 DIAGNOSIS — I10 Essential (primary) hypertension: Secondary | ICD-10-CM | POA: Diagnosis not present

## 2015-12-09 DIAGNOSIS — I429 Cardiomyopathy, unspecified: Secondary | ICD-10-CM | POA: Diagnosis not present

## 2015-12-09 NOTE — Progress Notes (Signed)
Cardiology Office Note Date:  12/09/2015  Patient ID:  Miguel, Roach 02/02/1941, MRN [id number] PCP:  Horatio Pel, MD  Cardiologist:  Dr. Aundra Dubin Electrophysiologist: Dr. Lovena Le   Chief Complaint: post ICD implant  History of Present Illness: Miguel Roach is a 75 y.o. male with history of NICM s/p ICD implant 11/24/15, HTN, Diverticulosis, CRI.  He comes today seen for Dr. Lovena Le for a wound check and post-implant visit.  He is feeling well outside of an acute gouty flare up.  He denies any CP, palpitations, no rest SOB, no dizziness, near syncope or syncope.  He denies therapies from his device.  He has been going to cardiac rehab up until his implant, felt like he was doing well with it, denies symptoms of orthopnea or PND, sleeps with one pillow, sometimes none at all.   Past Medical History  Diagnosis Date  . Liver lesion 2007  . Diverticulosis   . Internal hemorrhoids   . Adenomatous colon polyp 2011  . Gout   . Hypertension   . ED (erectile dysfunction)   . Anal fistula 2003  . Nonischemic cardiomyopathy ([place])     EF 20-25% by echo 2016, At that time 40% RCA stenosis, distal 35% circumflex stenosis  . AICD (automatic cardioverter/defibrillator) present   . Sleep apnea     "had test; never followed thru w/getting mask" (11/25/2015)  . CHF (congestive heart failure) ([place])   . Arthritis   . Renal cyst 2007  . CRI (chronic renal insufficiency)     creatinine back to normal    Past Surgical History  Procedure Laterality Date  . Anal fistulotomy  01/2002  . Cardiac catheterization N/A 04/16/2015    Procedure: Right/Left Heart Cath and Coronary Angiography;  Surgeon: Belva Crome, MD;  Location: [facility];  Service: Cardiovascular;  Laterality: N/A;  . Insertion of icd  11/24/2015  . Tonsillectomy  ~ 1950  . Ep implantable device N/A 11/24/2015    Procedure: ICD Implant;  Surgeon: Evans Lance, MD;  Location: [facility];  Service:  Cardiovascular;  Laterality: N/A;    Current Outpatient Prescriptions  Medication Sig Dispense Refill  . allopurinol (ZYLOPRIM) 100 MG tablet Take 2 tablets (200 mg total) by mouth daily. 60 tablet 6  . carvedilol (COREG) 3.125 MG tablet Take 1 tablet (3.125 mg total) by mouth 2 (two) times daily. 180 tablet 3  . colchicine 0.6 MG tablet Take 2 tablets then take 1 tablet one hour later daily. (Patient taking differently: Take 1.2 mg by mouth as needed (gout). Take 2 tablets then take 1 tablet one hour later daily.) 60 tablet 2  . digoxin (LANOXIN) 0.125 MG tablet Take 0.5 tablets (0.0625 mg total) by mouth daily. 30 tablet 3  . furosemide (LASIX) 40 MG tablet Take 40-80 mg by mouth 2 (two) times daily. Take 80mg  in the morning and 40mg  in the evening    . hydrALAZINE (APRESOLINE) 10 MG tablet Take 1 tablet (10 mg total) by mouth 3 (three) times daily. 90 tablet 6  . isosorbide mononitrate (IMDUR) 30 MG 24 hr tablet Take 0.5 tablets (15 mg total) by mouth daily. 15 tablet 6  . PREDNISONE PO Take 4 mg by mouth.    . spironolactone (ALDACTONE) 25 MG tablet Take 1 tablet (25 mg total) by mouth daily. 30 tablet 3   No current facility-administered medications for this visit.    Allergies:   Benicar   Social History:  The patient  reports that he has never smoked. He has never used smokeless tobacco. He reports that he drinks alcohol. He reports that he does not use illicit drugs.   Family History:  The patient's family history includes Breast cancer in his sister; Colon polyps in his brother; Diabetes in his father, sister, and sister; Heart failure (age of onset: 77) in his mother; Sickle cell anemia in his brother.  ROS:  Please see the history of present illness.  All other systems are reviewed and otherwise negative.   PHYSICAL EXAM:  VS:  BP 118/72 mmHg  Pulse 69  Ht 5\' 11"  (1.803 m)  Wt 163 lb (73.936 kg)  BMI 22.74 kg/m2 BMI: Body mass index is 22.74 kg/(m^2). Thin, well nourished,  well developed, in no acute distress HEENT: normocephalic, atraumatic Neck: no JVD, carotid bruits or masses Cardiac:  normal S1, S2; RRR; no significant murmurs, no rubs, or gallops Lungs:  clear to auscultation bilaterally, no wheezing, rhonchi or rales Abd: soft, nontender MS: no deformity or atrophy Ext: no edema Skin: warm and dry, no rash Neuro:  No gross deficits appreciated Psych: euthymic mood, full affect  ICD site: is dry, no erythema edema or increased heat to the surrounding tissues. The wound edges appear well approximated, there a very small area approx 69mm area just inferior to suture line very superficial skin loss no drainage, not bleeding (evaluated with Dr. Lovena Le)   EKG:  Done today shows SR, PVC, IVCD, LAD, nonspecific ST/T changes ICD interrogation today: normal device function, see interrogation/pace art  09/27/15: Echocardiogram Study Conclusions - Left ventricle: The cavity size was severely dilated. Systolic function was severely reduced. The estimated ejection fraction was in the range of 15 to 20%. Diffuse hypokinesis. Doppler parameters are consistent with abnormal left ventricular relaxation (grade 1 diastolic dysfunction). Doppler parameters are consistent with elevated ventricular end-diastolic filling pressure. - Aortic valve: There was moderate regurgitation. - Aortic root: The aortic root was normal in size. - Mitral valve: Structurally normal valve. There was mild regurgitation. - Left atrium: The atrium was moderately dilated. - Right ventricle: Systolic function was normal. - Tricuspid valve: There was trivial regurgitation. - Pulmonic valve: There was mild regurgitation. - Pulmonary arteries: Systolic pressure was within the normal range. - Inferior vena cava: The vessel was normal in size. - Pericardium, extracardiac: There was no pericardial effusion.  04/16/15: LHC Coronary Findings    Dominance: Co-dominant   Left  Circumflex   . Prox Cx to Dist Cx lesion, 35% stenosed. The lesion is type non-C .     Right Coronary Artery   . 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 The lesion is type non-C .       Right Heart Pressures Moderate pulmonary hypertension as documented     Recent Labs: 04/07/2015: Pro B Natriuretic peptide (BNP) 1954.0*; TSH 2.14 08/30/2015: B Natriuretic Peptide 1605.0* 11/24/2015: BUN 41*; Creatinine, Ser 2.13*; Hemoglobin 11.6*; Platelets PLATELET CLUMPS NOTED ON SMEAR, COUNT APPEARS DECREASED; Potassium 4.0; Sodium 144  No results found for requested labs within last 365 days.   Estimated Creatinine Clearance: 31.8 mL/min (by C-G formula based on Cr of 2.13).   Wt Readings from Last 3 Encounters:  12/09/15 163 lb (73.936 kg)  11/25/15 161 lb 8 oz (73.256 kg)  11/11/15 163 lb (73.936 kg)     Other studies reviewed: Additional studies/records reviewed today include: summarized above  DEVICE information:  MDT model number Visia AF ICD single chamber device, implanted 11/24/15 by  Dr. Lovena Le  ASSESSMENT AND PLAN:  1. NICM     S/p ICD    site is stable     appears well compensated    On BB, diuretic tx, no ACE/ARB with CRI    Reviewed wound care and LUE instructions with him  2. HTN     appears controlled  Disposition: F/u with Dr. Lovena Le in 3 months, Q 20month [facility] remotes, Dr. Aundra Dubin as scheduled, no objection to resuming cardiac rehab from a device standpoint, with the LUE limitations discussed with him for the next 6 weeks.  Current medicines are reviewed at length with the patient today.  The patient did not have any concerns regarding medicines.  Haywood Lasso, PA-C 12/09/2015 3:30 PM     [address] [place] [address] [phone] (office)  [phone] (fax)

## 2015-12-09 NOTE — Patient Instructions (Addendum)
Medication Instructions:   Your physician recommends that you continue on your current medications as directed. Please refer to the Current Medication list given to you today.   If you need a refill on your cardiac medications before your next appointment, please call your pharmacy.  Labwork:  NONE ORDER TODAY   Testing/Procedures:  NONE ORDER TODAY    Follow-Up:  With Dr Lovena Le    Remote monitoring is used to monitor your Pacemaker of ICD from home. This monitoring reduces the number of office visits required to check your device to one time per year. It allows [place] to keep an eye on the functioning of your device to ensure it is working properly. You are scheduled for a device check from home on . 03/08/16..You may send your transmission at any time that day. If you have a wireless device, the transmission will be sent automatically. After your physician reviews your transmission, you will receive a postcard with your next transmission date.     Any Other Special Instructions Will Be Listed Below (If Applicable).

## 2015-12-10 ENCOUNTER — Encounter (HOSPITAL_COMMUNITY): Payer: Medicare Other

## 2015-12-13 ENCOUNTER — Encounter (HOSPITAL_COMMUNITY)
Admission: RE | Admit: 2015-12-13 | Discharge: 2015-12-13 | Disposition: A | Discharge status: Home / Self Care | Payer: Medicare Other | Source: Ambulatory Visit | Attending: Cardiology | Admitting: Cardiology

## 2015-12-13 DIAGNOSIS — I509 Heart failure, unspecified: Secondary | ICD-10-CM | POA: Diagnosis not present

## 2015-12-15 ENCOUNTER — Ambulatory Visit (HOSPITAL_COMMUNITY)
Admission: RE | Admit: 2015-12-15 | Discharge: 2015-12-15 | Disposition: A | Discharge status: Home / Self Care | Payer: Medicare Other | Source: Ambulatory Visit | Attending: Cardiology | Admitting: Cardiology

## 2015-12-15 ENCOUNTER — Encounter (HOSPITAL_COMMUNITY): Payer: Self-pay

## 2015-12-15 ENCOUNTER — Encounter (HOSPITAL_COMMUNITY)
Admission: RE | Admit: 2015-12-15 | Discharge: 2015-12-15 | Disposition: A | Discharge status: Home / Self Care | Payer: Medicare Other | Source: Ambulatory Visit | Attending: Cardiology | Admitting: Cardiology

## 2015-12-15 VITALS — BP 100/64 | HR 64 | Wt 164.0 lb

## 2015-12-15 DIAGNOSIS — Z79899 Other long term (current) drug therapy: Secondary | ICD-10-CM | POA: Diagnosis not present

## 2015-12-15 DIAGNOSIS — Z95810 Presence of automatic (implantable) cardiac defibrillator: Secondary | ICD-10-CM | POA: Insufficient documentation

## 2015-12-15 DIAGNOSIS — I428 Other cardiomyopathies: Secondary | ICD-10-CM | POA: Diagnosis not present

## 2015-12-15 DIAGNOSIS — I509 Heart failure, unspecified: Secondary | ICD-10-CM | POA: Insufficient documentation

## 2015-12-15 DIAGNOSIS — M109 Gout, unspecified: Secondary | ICD-10-CM | POA: Diagnosis not present

## 2015-12-15 DIAGNOSIS — I5022 Chronic systolic (congestive) heart failure: Secondary | ICD-10-CM | POA: Diagnosis not present

## 2015-12-15 DIAGNOSIS — G4733 Obstructive sleep apnea (adult) (pediatric): Secondary | ICD-10-CM | POA: Insufficient documentation

## 2015-12-15 DIAGNOSIS — N183 Chronic kidney disease, stage 3 unspecified: Secondary | ICD-10-CM

## 2015-12-15 NOTE — Progress Notes (Signed)
P  Advanced Heart Failure Clinic Note   Patient ID: Miguel Roach, male   DOB: 06/04/1941, 75 y.o.   MRN: [id number] PCP: Dr. Shelia Media Primary cardiologist: Dr. Percival Spanish HF: Dr. Aundra Dubin   75 yo with history of chronic systolic CHF/nonischemic cardiomyopathy and CKD presents for CHF clinic evaluation. He actually had an echo back in 2010 with EF 40-45% at the time.  He says that this really was not followed up upon by a cardiologist and that he did well for a number of years after that. In 6/16, he was admitted with acute systolic CHF.  Echo showed EF down to 20-25%.  Cardiac cath showed nonobstructive CAD.  His Lasix has been gradually increased.  This has brought his weight down and improved his breathing, but creatinine has risen. He had been on olmesartan but this was stopped because he thought it was causing cough.  He was then put on losartan, but this was stopped with rise in creatinine. Also intolerant to Bidil with orthostatic symptoms.  He was started on digoxin.  He returns today for followup.  Echo in 12/16 showed persistently low EF, 15-20%.  Medtronic ICD was placed in 2/17.  He still feels good generally.  No orthopnea/PND.  No dyspnea walking on flat ground.  Mild dyspnea walking up a hill.  No lightheadedness or syncope, able to tolerate low dose hydralazine/Imdur.  He is doing cardiac rehab and going to the [facility].  He has been having more gout flares on the higher dose of diuretic, now on allopurinol.  Labs (6/16): BNP 1954, TSH normal Labs (7/16): K 4.1, creatinine 1.72 Labs (8/16): creatinine 2.3 Labs (9/16): digoxin 0.3, K 4, creatinine 2.14, SPEP negative, UPEP negative Labs (10/16): K 4.6, creatinine 1.62 Labs (11/16): K 4.4, creatinine 2.33, digoxin 0.5 Labs (12/16): creatinine 2.1 (nephrology) Labs (1/17): digoxin 0.6, uric acid 10.4 Labs (2/17): K 4, creatinine 2.13  PMH: 1. CKD: Follows with Dr Mercy Gauer. 2. Gout 3. Chronic systolic CHF: Nonischemic cardiomyopathy.  EF  40-45% in 2010.  Echo (6/16) with EF 20-25%, severe LV dilation, mild MR, mild AI, moderate LAE.  LHC/RHC (6/16) with nonobstructive CAD; mean RA 2, RV 48/1, PA 47/18, mean PCWP 12, CI 3.2.  CPX (10/16) with peak VO2 13.1 (50% predicted), VE/VCO2 slope 39.3, RER 1.22 => moderate to severe functional limitation.  Echo (12/16) with EF 15-20%, severe LV dilation, diffuse hypokinesis, moderate AI, mild MR, normal RV size and systolic function. Medtronic ICD.  4. ACEI cough.  5. Pleural effusion: Thoracentesis on right in 8/16.  It was a transudate, likely due to CHF.  6. OSA: To start Bipap.   SH: Married, retired Pharmacist, hospital, never smoked, no ETOH.   FH: Mother with "weak heart." Brother with sickle cell anemia.   ROS: All systems reviewed and negative except as per HPI.   Current Outpatient Prescriptions  Medication Sig Dispense Refill  . allopurinol (ZYLOPRIM) 100 MG tablet Take 2 tablets (200 mg total) by mouth daily. 60 tablet 6  . carvedilol (COREG) 3.125 MG tablet Take 1 tablet (3.125 mg total) by mouth 2 (two) times daily. 180 tablet 3  . colchicine 0.6 MG tablet Take 0.6 mg by mouth as needed (take 1 tablet every monday wednesday and friday).    Marland Kitchen digoxin (LANOXIN) 0.125 MG tablet Take 0.5 tablets (0.0625 mg total) by mouth daily. 30 tablet 3  . furosemide (LASIX) 40 MG tablet Take 40-80 mg by mouth 2 (two) times daily. Take 80mg  in the morning  and 40mg  in the evening    . hydrALAZINE (APRESOLINE) 10 MG tablet Take 1 tablet (10 mg total) by mouth 3 (three) times daily. 90 tablet 6  . isosorbide mononitrate (IMDUR) 30 MG 24 hr tablet Take 0.5 tablets (15 mg total) by mouth daily. 15 tablet 6  . PREDNISONE PO Take 4 mg by mouth.    . spironolactone (ALDACTONE) 25 MG tablet Take 1 tablet (25 mg total) by mouth daily. 30 tablet 3   No current facility-administered medications for this encounter.   BP 100/64 mmHg  Pulse 64  Wt 164 lb (74.39 kg)  SpO2 100%  General: NAD Neck: JVP 7 cm, no  thyromegaly or lymphadenopathy.  Lungs: CTA, normal effort CV: Nondisplaced PMI.  Heart regular S1/S2 with wide S2 split, no S3/S4, no murmur.  No edema.  No carotid bruit.  Normal pedal pulses.  Abdomen: Soft, NT, ND, no HSM. +BS Skin: Intact without lesions or rashes.  Neurologic: Alert and oriented x 3.  Psych: Normal affect. Extremities: No clubbing or cyanosis.  HEENT: Normal.   Assessment/Plan: 1. Chronic systolic CHF: Nonischemic cardiomyopathy, NYHA class II symptoms.  CO preserved on RHC in 6/16, but recent CPX showed moderate to severe functional impairment (seems worse than his symptoms would suggest). No significant CAD with coronary angiography in 6/16.  No hx heavy ETOH or drug abuse. No marked HTN.  Negative SPEP/UPEP.  Possible prior myocarditis.  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  He now has Medtronic ICD.  On exam, he looks euvolemic.  - Continue Lasix 80 qam/40 qpm.   - Continue spironolactone 25 mg daily.   - Continue Coreg 3.125 mg bid.  - Hold off on ARB or Entresto at this time with elevated creatinine. Off Bidil with symptomatic low BP.  He has tolerated hydralazine 10 mg tid with Imdur 15 daily.  I will not try to increase with soft BP, will reassess at next appointment. - Continue digoxin, level ok in 1/17.  - Continue cardiac rehab.  2. CKD: Stage III.  Recent BMET stable. 3. OSA: Bipap. 4. Gout: More gout flares with increased diuretics.  He has now started allopurinol.  Followup in 2 months.   [name] 12/15/2015

## 2015-12-15 NOTE — Patient Instructions (Signed)
Follow up 2 months with Dr. Aundra Dubin.

## 2015-12-17 ENCOUNTER — Encounter (HOSPITAL_COMMUNITY): Payer: Medicare Other

## 2015-12-20 ENCOUNTER — Encounter (HOSPITAL_COMMUNITY)
Admission: RE | Admit: 2015-12-20 | Discharge: 2015-12-20 | Disposition: A | Discharge status: Home / Self Care | Payer: Medicare Other | Source: Ambulatory Visit | Attending: Cardiology | Admitting: Cardiology

## 2015-12-20 DIAGNOSIS — I509 Heart failure, unspecified: Secondary | ICD-10-CM | POA: Diagnosis not present

## 2015-12-22 ENCOUNTER — Encounter (HOSPITAL_COMMUNITY)
Admission: RE | Admit: 2015-12-22 | Discharge: 2015-12-22 | Disposition: A | Discharge status: Home / Self Care | Payer: Medicare Other | Source: Ambulatory Visit | Attending: Cardiology | Admitting: Cardiology

## 2015-12-22 DIAGNOSIS — I509 Heart failure, unspecified: Secondary | ICD-10-CM | POA: Diagnosis not present

## 2015-12-24 ENCOUNTER — Encounter (HOSPITAL_COMMUNITY): Payer: Medicare Other

## 2015-12-27 ENCOUNTER — Encounter (HOSPITAL_COMMUNITY)
Admission: RE | Admit: 2015-12-27 | Discharge: 2015-12-27 | Disposition: A | Discharge status: Home / Self Care | Payer: Medicare Other | Source: Ambulatory Visit | Attending: Cardiology | Admitting: Cardiology

## 2015-12-27 DIAGNOSIS — I509 Heart failure, unspecified: Secondary | ICD-10-CM | POA: Diagnosis not present

## 2015-12-27 NOTE — Progress Notes (Signed)
Reviewed home exercise with pt today.  Pt plans to walk and go to [facility] on Tues/Thurs for exercise, in addition to coming to [facility] M/W/F. Reviewed THR, pulse, RPE, sign and symptoms, and when to call 911 or MD.  Also discussed weather considerations and indoor options.  Pt voiced understanding.     Miguel Roach [employer]

## 2015-12-29 ENCOUNTER — Encounter (HOSPITAL_COMMUNITY)
Admission: RE | Admit: 2015-12-29 | Discharge: 2015-12-29 | Disposition: A | Discharge status: Home / Self Care | Payer: Medicare Other | Source: Ambulatory Visit | Attending: Cardiology | Admitting: Cardiology

## 2015-12-29 DIAGNOSIS — I509 Heart failure, unspecified: Secondary | ICD-10-CM | POA: Diagnosis not present

## 2015-12-31 ENCOUNTER — Encounter (HOSPITAL_COMMUNITY): Payer: Medicare Other

## 2016-01-03 ENCOUNTER — Encounter (HOSPITAL_COMMUNITY)
Admission: RE | Admit: 2016-01-03 | Discharge: 2016-01-03 | Disposition: A | Discharge status: Home / Self Care | Payer: Medicare Other | Source: Ambulatory Visit | Attending: Cardiology | Admitting: Cardiology

## 2016-01-03 DIAGNOSIS — I509 Heart failure, unspecified: Secondary | ICD-10-CM | POA: Diagnosis not present

## 2016-01-05 ENCOUNTER — Encounter (HOSPITAL_COMMUNITY)
Admission: RE | Admit: 2016-01-05 | Discharge: 2016-01-05 | Disposition: A | Discharge status: Home / Self Care | Payer: Medicare Other | Source: Ambulatory Visit | Attending: Cardiology | Admitting: Cardiology

## 2016-01-05 DIAGNOSIS — I509 Heart failure, unspecified: Secondary | ICD-10-CM | POA: Diagnosis not present

## 2016-01-07 ENCOUNTER — Encounter (HOSPITAL_COMMUNITY): Payer: Medicare Other

## 2016-01-09 ENCOUNTER — Other Ambulatory Visit (HOSPITAL_COMMUNITY): Payer: Self-pay | Admitting: Cardiology

## 2016-01-10 ENCOUNTER — Encounter (HOSPITAL_COMMUNITY)
Admission: RE | Admit: 2016-01-10 | Discharge: 2016-01-10 | Disposition: A | Discharge status: Home / Self Care | Payer: Medicare Other | Source: Ambulatory Visit | Attending: Cardiology | Admitting: Cardiology

## 2016-01-10 DIAGNOSIS — I509 Heart failure, unspecified: Secondary | ICD-10-CM | POA: Diagnosis not present

## 2016-01-12 ENCOUNTER — Encounter (HOSPITAL_COMMUNITY)
Admission: RE | Admit: 2016-01-12 | Discharge: 2016-01-12 | Disposition: A | Discharge status: Home / Self Care | Payer: Medicare Other | Source: Ambulatory Visit | Attending: Cardiology | Admitting: Cardiology

## 2016-01-12 DIAGNOSIS — I509 Heart failure, unspecified: Secondary | ICD-10-CM | POA: Diagnosis not present

## 2016-01-12 NOTE — Progress Notes (Signed)
No psychosocial needs identfied, no interventions necessary.  Pt is exercising on his own. Will continue to monitor.

## 2016-01-14 ENCOUNTER — Encounter (HOSPITAL_COMMUNITY): Payer: Medicare Other

## 2016-01-17 ENCOUNTER — Encounter (HOSPITAL_COMMUNITY)
Admission: RE | Admit: 2016-01-17 | Discharge: 2016-01-17 | Disposition: A | Discharge status: Home / Self Care | Payer: Medicare Other | Source: Ambulatory Visit | Attending: Cardiology | Admitting: Cardiology

## 2016-01-17 DIAGNOSIS — I509 Heart failure, unspecified: Secondary | ICD-10-CM | POA: Insufficient documentation

## 2016-01-19 ENCOUNTER — Encounter (HOSPITAL_COMMUNITY)
Admission: RE | Admit: 2016-01-19 | Discharge: 2016-01-19 | Disposition: A | Discharge status: Home / Self Care | Payer: Medicare Other | Source: Ambulatory Visit | Attending: Cardiology | Admitting: Cardiology

## 2016-01-19 DIAGNOSIS — I509 Heart failure, unspecified: Secondary | ICD-10-CM | POA: Diagnosis not present

## 2016-01-21 ENCOUNTER — Encounter (HOSPITAL_COMMUNITY): Payer: Medicare Other

## 2016-01-24 ENCOUNTER — Encounter (HOSPITAL_COMMUNITY)
Admission: RE | Admit: 2016-01-24 | Discharge: 2016-01-24 | Disposition: A | Discharge status: Home / Self Care | Payer: Medicare Other | Source: Ambulatory Visit | Attending: Cardiology | Admitting: Cardiology

## 2016-01-24 DIAGNOSIS — I509 Heart failure, unspecified: Secondary | ICD-10-CM | POA: Diagnosis not present

## 2016-01-26 ENCOUNTER — Encounter (HOSPITAL_COMMUNITY)
Admission: RE | Admit: 2016-01-26 | Discharge: 2016-01-26 | Disposition: A | Discharge status: Home / Self Care | Payer: Medicare Other | Source: Ambulatory Visit | Attending: Cardiology | Admitting: Cardiology

## 2016-01-26 DIAGNOSIS — I509 Heart failure, unspecified: Secondary | ICD-10-CM | POA: Diagnosis not present

## 2016-01-28 ENCOUNTER — Encounter (HOSPITAL_COMMUNITY): Payer: Medicare Other

## 2016-01-29 ENCOUNTER — Other Ambulatory Visit (HOSPITAL_COMMUNITY): Payer: Self-pay | Admitting: Cardiology

## 2016-01-31 ENCOUNTER — Encounter (HOSPITAL_COMMUNITY)
Admission: RE | Admit: 2016-01-31 | Discharge: 2016-01-31 | Disposition: A | Discharge status: Home / Self Care | Payer: Medicare Other | Source: Ambulatory Visit | Attending: Cardiology | Admitting: Cardiology

## 2016-01-31 DIAGNOSIS — I509 Heart failure, unspecified: Secondary | ICD-10-CM | POA: Diagnosis not present

## 2016-02-02 ENCOUNTER — Encounter (HOSPITAL_COMMUNITY)
Admission: RE | Admit: 2016-02-02 | Discharge: 2016-02-02 | Disposition: A | Discharge status: Home / Self Care | Payer: Medicare Other | Source: Ambulatory Visit | Attending: Cardiology | Admitting: Cardiology

## 2016-02-02 DIAGNOSIS — I509 Heart failure, unspecified: Secondary | ICD-10-CM | POA: Diagnosis not present

## 2016-02-04 ENCOUNTER — Encounter (HOSPITAL_COMMUNITY): Payer: Medicare Other

## 2016-02-07 ENCOUNTER — Encounter (HOSPITAL_COMMUNITY): Payer: Medicare Other

## 2016-02-09 ENCOUNTER — Encounter (HOSPITAL_COMMUNITY): Payer: Medicare Other

## 2016-02-11 ENCOUNTER — Encounter (HOSPITAL_COMMUNITY): Payer: Medicare Other

## 2016-02-14 ENCOUNTER — Encounter (HOSPITAL_COMMUNITY)
Admission: RE | Admit: 2016-02-14 | Discharge: 2016-02-14 | Disposition: A | Discharge status: Home / Self Care | Payer: Medicare Other | Source: Ambulatory Visit | Attending: Cardiology | Admitting: Cardiology

## 2016-02-14 DIAGNOSIS — I509 Heart failure, unspecified: Secondary | ICD-10-CM | POA: Insufficient documentation

## 2016-02-16 ENCOUNTER — Encounter (HOSPITAL_COMMUNITY)
Admission: RE | Admit: 2016-02-16 | Discharge: 2016-02-16 | Disposition: A | Discharge status: Home / Self Care | Payer: Medicare Other | Source: Ambulatory Visit | Attending: Cardiology | Admitting: Cardiology

## 2016-02-16 ENCOUNTER — Encounter (HOSPITAL_COMMUNITY): Payer: Self-pay

## 2016-02-16 ENCOUNTER — Ambulatory Visit (HOSPITAL_COMMUNITY)
Admission: RE | Admit: 2016-02-16 | Discharge: 2016-02-16 | Disposition: A | Discharge status: Home / Self Care | Payer: Medicare Other | Source: Ambulatory Visit | Attending: Cardiology | Admitting: Cardiology

## 2016-02-16 VITALS — BP 107/66 | HR 68 | Resp 18 | Wt 168.2 lb

## 2016-02-16 DIAGNOSIS — N183 Chronic kidney disease, stage 3 unspecified: Secondary | ICD-10-CM

## 2016-02-16 DIAGNOSIS — M109 Gout, unspecified: Secondary | ICD-10-CM | POA: Diagnosis not present

## 2016-02-16 DIAGNOSIS — I5022 Chronic systolic (congestive) heart failure: Secondary | ICD-10-CM | POA: Diagnosis not present

## 2016-02-16 DIAGNOSIS — I509 Heart failure, unspecified: Secondary | ICD-10-CM | POA: Diagnosis not present

## 2016-02-16 DIAGNOSIS — G4733 Obstructive sleep apnea (adult) (pediatric): Secondary | ICD-10-CM | POA: Insufficient documentation

## 2016-02-16 DIAGNOSIS — I428 Other cardiomyopathies: Secondary | ICD-10-CM | POA: Insufficient documentation

## 2016-02-16 DIAGNOSIS — Z95810 Presence of automatic (implantable) cardiac defibrillator: Secondary | ICD-10-CM | POA: Insufficient documentation

## 2016-02-16 DIAGNOSIS — Z79899 Other long term (current) drug therapy: Secondary | ICD-10-CM | POA: Diagnosis not present

## 2016-02-16 LAB — BASIC METABOLIC PANEL
ANION GAP: 10 (ref 5–15)
BUN: 24 mg/dL — ABNORMAL HIGH (ref 6–20)
CALCIUM: 9.2 mg/dL (ref 8.9–10.3)
CHLORIDE: 109 mmol/L (ref 101–111)
CO2: 24 mmol/L (ref 22–32)
Creatinine, Ser: 2.22 mg/dL — ABNORMAL HIGH (ref 0.61–1.24)
GFR calc non Af Amer: 27 mL/min — ABNORMAL LOW (ref >60)
GFR, EST AFRICAN AMERICAN: 32 mL/min — AB (ref >60)
Glucose, Bld: 102 mg/dL — ABNORMAL HIGH (ref 65–99)
Potassium: 3.8 mmol/L (ref 3.5–5.1)
SODIUM: 143 mmol/L (ref 135–145)

## 2016-02-16 LAB — DIGOXIN LEVEL: DIGOXIN LVL: 0.7 ng/mL — AB (ref 0.8–2.0)

## 2016-02-16 MED ORDER — CARVEDILOL 6.25 MG PO TABS: 6.2500 mg | ORAL_TABLET | Freq: Two times a day (BID) | ORAL | Status: DC

## 2016-02-16 NOTE — Patient Instructions (Signed)
Routine lab work today. Will notify you of abnormal results, otherwise no news is good news!  INCREASE Carvedilol (Coreg) to 6.25 mg twice daily.  Follow up 2 months with Dr. Aundra Dubin.  Do the following things EVERYDAY: 1) Weigh yourself in the morning before breakfast. Write it down and keep it in a log. 2) Take your medicines as prescribed 3) Eat low salt foods-Limit salt (sodium) to 2000 mg per day.  4) Stay as active as you can everyday 5) Limit all fluids for the day to less than 2 liters

## 2016-02-16 NOTE — Progress Notes (Signed)
P  Advanced Heart Failure Clinic Note   Patient ID: Miguel Roach, male   DOB: Mar 13, 1941, 75 y.o.   MRN: [id number] PCP: Dr. Shelia Media HF: Dr. Aundra Dubin   75 yo with history of chronic systolic CHF/nonischemic cardiomyopathy and CKD presents for CHF clinic evaluation. He actually had an echo back in 2010 with EF 40-45% at the time.  He says that this really was not followed up upon by a cardiologist and that he did well for a number of years after that. In 6/16, he was admitted with acute systolic CHF.  Echo showed EF down to 20-25%.  Cardiac cath showed nonobstructive CAD.  His Lasix has been gradually increased.  This has brought his weight down and improved his breathing, but creatinine has risen. He had been on olmesartan but this was stopped because he thought it was causing cough.  He was then put on losartan, but this was stopped with rise in creatinine. Also intolerant to Bidil with orthostatic symptoms.  He was started on digoxin.  He returns today for followup.  Echo in 12/16 showed persistently low EF, 15-20%.  Medtronic ICD was placed in 2/17.  He still feels good generally.  No orthopnea/PND.  No dyspnea walking on flat ground.  Mild dyspnea walking up a hill.  No lightheadedness or syncope.  He is doing cardiac rehab and going to the [facility].  Gout is now controlled, has been seeing a rheumatologist.  Labs (6/16): BNP 1954, TSH normal Labs (7/16): K 4.1, creatinine 1.72 Labs (8/16): creatinine 2.3 Labs (9/16): digoxin 0.3, K 4, creatinine 2.14, SPEP negative, UPEP negative Labs (10/16): K 4.6, creatinine 1.62 Labs (11/16): K 4.4, creatinine 2.33, digoxin 0.5 Labs (12/16): creatinine 2.1 (nephrology) Labs (1/17): digoxin 0.6, uric acid 10.4 Labs (2/17): K 4, creatinine 2.13, hgb 11.6  PMH: 1. CKD: Follows with Dr Mercy Yoho. 2. Gout 3. Chronic systolic CHF: Nonischemic cardiomyopathy.  EF 40-45% in 2010.  Echo (6/16) with EF 20-25%, severe LV dilation, mild MR, mild AI, moderate LAE.   LHC/RHC (6/16) with nonobstructive CAD; mean RA 2, RV 48/1, PA 47/18, mean PCWP 12, CI 3.2.  CPX (10/16) with peak VO2 13.1 (50% predicted), VE/VCO2 slope 39.3, RER 1.22 => moderate to severe functional limitation.  Echo (12/16) with EF 15-20%, severe LV dilation, diffuse hypokinesis, moderate AI, mild MR, normal RV size and systolic function. Medtronic ICD.  4. ACEI cough.  5. Pleural effusion: Thoracentesis on right in 8/16.  It was a transudate, likely due to CHF.  6. OSA: To start Bipap.   SH: Married, retired Pharmacist, hospital, never smoked, no ETOH.   FH: Mother with "weak heart." Brother with sickle cell anemia.   ROS: All systems reviewed and negative except as per HPI.   Current Outpatient Prescriptions  Medication Sig Dispense Refill  . allopurinol (ZYLOPRIM) 100 MG tablet Take 2 tablets (200 mg total) by mouth daily. 60 tablet 6  . carvedilol (COREG) 6.25 MG tablet Take 1 tablet (6.25 mg total) by mouth 2 (two) times daily. 60 tablet 6  . colchicine 0.6 MG tablet Take 0.6 mg by mouth as needed (take 1 tablet every monday wednesday and friday).    Marland Kitchen digoxin (LANOXIN) 0.125 MG tablet Take 0.5 tablets (0.0625 mg total) by mouth daily. 30 tablet 3  . furosemide (LASIX) 40 MG tablet Take 40-80 mg by mouth 2 (two) times daily. Take 80mg  in the morning and 40mg  in the evening    . furosemide (LASIX) 40 MG tablet TAKE  2 TABLETS BY MOUTH EVERY MORNING AND TAKE 1 TABLET BY MOUTH EVERY EVENING 90 tablet 3  . hydrALAZINE (APRESOLINE) 10 MG tablet Take 1 tablet (10 mg total) by mouth 3 (three) times daily. 90 tablet 6  . isosorbide mononitrate (IMDUR) 30 MG 24 hr tablet Take 0.5 tablets (15 mg total) by mouth daily. 15 tablet 6  . PREDNISONE PO Take 4 mg by mouth.    . spironolactone (ALDACTONE) 25 MG tablet TAKE 1 TABLET (25 MG TOTAL) BY MOUTH DAILY. 30 tablet 3   No current facility-administered medications for this encounter.   BP 107/66 mmHg  Pulse 68  Resp 18  Wt 168 lb 4 oz (76.318 kg)  SpO2  98%  General: NAD Neck: JVP 7 cm, no thyromegaly or lymphadenopathy.  Lungs: CTA, normal effort CV: Nondisplaced PMI.  Heart regular S1/S2 with wide S2 split, no S3/S4, no murmur.  No edema.  No carotid bruit.  Normal pedal pulses.  Abdomen: Soft, NT, ND, no HSM. +BS Skin: Intact without lesions or rashes.  Neurologic: Alert and oriented x 3.  Psych: Normal affect. Extremities: No clubbing or cyanosis.   Assessment/Plan: 1. Chronic systolic CHF: Nonischemic cardiomyopathy, NYHA class II symptoms.  CO preserved on RHC in 6/16, but 10/16 CPX showed moderate to severe functional impairment (seems worse than his symptoms would suggest). No significant CAD with coronary angiography in 6/16.  No hx heavy ETOH or drug abuse. No marked HTN.  Negative SPEP/UPEP.  Possible prior myocarditis.  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  He now has Medtronic ICD.  On exam, he looks euvolemic.  - Continue Lasix 80 qam/40 qpm.   - Continue spironolactone 25 mg daily.   - Increase Coreg to 6.25 mg bid.   - Hold off on ARB or Entresto at this time with elevated creatinine. Off Bidil with symptomatic low BP.  He has tolerated hydralazine 10 mg tid with Imdur 15 daily.   - Continue digoxin, Check level today.   - Continue cardiac rehab.  2. CKD: Stage III.  BMET today. 3. OSA: Bipap. 4. Gout: Gout controlled on allopurinol.  Followup in 2 months.   [name] 02/16/2016

## 2016-02-18 ENCOUNTER — Encounter (HOSPITAL_COMMUNITY): Payer: Medicare Other

## 2016-02-19 ENCOUNTER — Other Ambulatory Visit (HOSPITAL_COMMUNITY): Payer: Self-pay | Admitting: Cardiology

## 2016-02-21 ENCOUNTER — Encounter (HOSPITAL_COMMUNITY)
Admission: RE | Admit: 2016-02-21 | Discharge: 2016-02-21 | Disposition: A | Discharge status: Home / Self Care | Payer: Medicare Other | Source: Ambulatory Visit | Attending: Cardiology | Admitting: Cardiology

## 2016-02-21 DIAGNOSIS — I509 Heart failure, unspecified: Secondary | ICD-10-CM | POA: Diagnosis not present

## 2016-02-22 ENCOUNTER — Encounter: Payer: Self-pay | Admitting: Gastroenterology

## 2016-02-23 ENCOUNTER — Encounter (HOSPITAL_COMMUNITY)
Admission: RE | Admit: 2016-02-23 | Discharge: 2016-02-23 | Disposition: A | Discharge status: Home / Self Care | Payer: Medicare Other | Source: Ambulatory Visit | Attending: Cardiology | Admitting: Cardiology

## 2016-02-23 DIAGNOSIS — I509 Heart failure, unspecified: Secondary | ICD-10-CM | POA: Diagnosis not present

## 2016-02-25 ENCOUNTER — Encounter (HOSPITAL_COMMUNITY): Payer: Self-pay

## 2016-02-25 ENCOUNTER — Encounter: Payer: Self-pay | Admitting: Internal Medicine

## 2016-02-25 ENCOUNTER — Ambulatory Visit (INDEPENDENT_AMBULATORY_CARE_PROVIDER_SITE_OTHER): Payer: Medicare Other | Admitting: Internal Medicine

## 2016-02-25 ENCOUNTER — Encounter (HOSPITAL_COMMUNITY)
Admission: RE | Admit: 2016-02-25 | Discharge: 2016-02-25 | Disposition: A | Discharge status: Home / Self Care | Payer: Medicare Other | Source: Ambulatory Visit | Attending: Cardiology | Admitting: Cardiology

## 2016-02-25 VITALS — BP 128/64 | HR 71 | Ht 71.0 in | Wt 169.2 lb

## 2016-02-25 DIAGNOSIS — I429 Cardiomyopathy, unspecified: Secondary | ICD-10-CM | POA: Diagnosis not present

## 2016-02-25 DIAGNOSIS — I509 Heart failure, unspecified: Secondary | ICD-10-CM | POA: Diagnosis not present

## 2016-02-25 DIAGNOSIS — I5022 Chronic systolic (congestive) heart failure: Secondary | ICD-10-CM | POA: Diagnosis not present

## 2016-02-25 DIAGNOSIS — I428 Other cardiomyopathies: Secondary | ICD-10-CM

## 2016-02-25 LAB — CUP PACEART INCLINIC DEVICE CHECK
Implantable Lead Location: 753860
Implantable Lead Model: 6935
Lead Channel Setting Pacing Amplitude: 3.5 V
Lead Channel Setting Pacing Pulse Width: 0.4 ms
Lead Channel Setting Sensing Sensitivity: 0.3 mV
MDC IDC LEAD IMPLANT DT: 20170208
MDC IDC SESS DTM: 20170512151713

## 2016-02-25 NOTE — Progress Notes (Signed)
Pt graduated from cardiac rehab program today with completion of 36 exercise sessions in Phase II. Pt maintained good attendance with exception of his medical leave for ICD implantation and progressed nicely during his participation in rehab as evidenced by increased MET level.   Medication list reconciled. Repeat  PHQ score- 0 .  Pt has made significant lifestyle changes and should be commended for his success. Pt feels he has achieved his goals during cardiac rehab.   Pt plans to continue exercising on his own at home.

## 2016-02-25 NOTE — Patient Instructions (Signed)
Medication Instructions:  Your physician recommends that you continue on your current medications as directed. Please refer to the Current Medication list given to you today.   Labwork: None ordered   Testing/Procedures: None ordered   Follow-Up: Your physician wants you to follow-up in: 9 months with Dr Taylor(February) Dennis Bast will receive a reminder letter in the mail two months in advance. If you don't receive a letter, please call our office to schedule the follow-up appointment.  Remote monitoring is used to monitor your ICD from home. This monitoring reduces the number of office visits required to check your device to one time per year. It allows [place] to keep an eye on the functioning of your device to ensure it is working properly. You are scheduled for a device check from home on 05/26/16. You may send your transmission at any time that day. If you have a wireless device, the transmission will be sent automatically. After your physician reviews your transmission, you will receive a postcard with your next transmission date.     Any Other Special Instructions Will Be Listed Below (If Applicable).     If you need a refill on your cardiac medications before your next appointment, please call your pharmacy.

## 2016-02-25 NOTE — Progress Notes (Signed)
HPI Miguel Roach is referred today for ongoing followup of his ICD. He is a pleasant 75 yo man who has a h/o LV dysfunction dating back 5-6 years. His EF was 40-45%. He did well until 2 months ago when he began to experience sob, fatigue and weakness, especially with exertion. The patient sought medical attention and ultimately underwent catheterization where he was found to have no obstructive CAD. Echo demonstrated and EF of 30%. He feels poorly and c/o weakness and fatigue. His symptoms worsened with uptitration of his beta blocker. He has mild peripheral edema. No syncope. He has graduated from cardiac rehab and remains active. Allergies  Allergen Reactions  . Benicar [Olmesartan] Cough     Current Outpatient Prescriptions  Medication Sig Dispense Refill  . allopurinol (ZYLOPRIM) 100 MG tablet Take 2 tablets (200 mg total) by mouth daily. 60 tablet 6  . carvedilol (COREG) 6.25 MG tablet Take 1 tablet (6.25 mg total) by mouth 2 (two) times daily. 60 tablet 6  . colchicine 0.6 MG tablet Take 0.6 mg by mouth as needed (take 1 tablet every monday wednesday and friday).    Marland Kitchen digoxin (LANOXIN) 0.125 MG tablet TAKE 0.5 TABLETS (0.0625 MG TOTAL) BY MOUTH DAILY. 30 tablet 3  . furosemide (LASIX) 40 MG tablet Take 40-80 mg by mouth 2 (two) times daily. Take 80mg  in the morning and 40mg  in the evening    . furosemide (LASIX) 40 MG tablet TAKE 2 TABLETS BY MOUTH EVERY MORNING AND TAKE 1 TABLET BY MOUTH EVERY EVENING 90 tablet 3  . hydrALAZINE (APRESOLINE) 10 MG tablet Take 1 tablet (10 mg total) by mouth 3 (three) times daily. 90 tablet 6  . isosorbide mononitrate (IMDUR) 30 MG 24 hr tablet Take 0.5 tablets (15 mg total) by mouth daily. 15 tablet 6  . PREDNISONE PO Take 4 mg by mouth.    . spironolactone (ALDACTONE) 25 MG tablet TAKE 1 TABLET (25 MG TOTAL) BY MOUTH DAILY. 30 tablet 3   No current facility-administered medications for this visit.     Past Medical History  Diagnosis Date  .  Liver lesion 2007  . Diverticulosis   . Internal hemorrhoids   . Adenomatous colon polyp 2011  . Gout   . Hypertension   . ED (erectile dysfunction)   . Anal fistula 2003  . Nonischemic cardiomyopathy ([place])     EF 20-25% by echo 2016, At that time 40% RCA stenosis, distal 35% circumflex stenosis  . AICD (automatic cardioverter/defibrillator) present   . Sleep apnea     "had test; never followed thru w/getting mask" (11/25/2015)  . CHF (congestive heart failure) ([place])   . Arthritis   . Renal cyst 2007  . CRI (chronic renal insufficiency)     creatinine back to normal    ROS:   All systems reviewed and negative except as noted in the HPI.   Past Surgical History  Procedure Laterality Date  . Anal fistulotomy  01/2002  . Cardiac catheterization N/A 04/16/2015    Procedure: Right/Left Heart Cath and Coronary Angiography;  Surgeon: Belva Crome, MD;  Location: [facility];  Service: Cardiovascular;  Laterality: N/A;  . Insertion of icd  11/24/2015  . Tonsillectomy  ~ 1950  . Ep implantable device N/A 11/24/2015    Procedure: ICD Implant;  Surgeon: Evans Lance, MD;  Location: [facility];  Service: Cardiovascular;  Laterality: N/A;     Family History  Problem Relation  Age of Onset  . Heart failure Mother 40  . Sickle cell anemia Brother   . Breast cancer Sister   . Diabetes Sister   . Diabetes Father     died at age 16  . Diabetes Sister   . Colon polyps Brother   . Clotting disorder      two daughters (inherited from his first wife)     Social History   Social History  . Marital Status: Married    Spouse Name: N/A  . Number of Children: 4  . Years of Education: N/A   Occupational History  . Retired Environmental consultant   . Retired Animal nutritionist    Social History Main Topics  . Smoking status: Never Smoker   . Smokeless tobacco: Never Used     Comment: "smoked briefly in my freshman year in college; purchased 1  pack of cigarettes; didn't finish  that"  . Alcohol Use: 0.0 oz/week    0 Standard drinks or equivalent per week     Comment: 11/25/2015 "used to drink a beer q now in then back in my 20s"  . Drug Use: No  . Sexual Activity: Not Currently   Other Topics Concern  . Not on file   Social History Narrative     BP 128/64 mmHg  Pulse 71  Ht 5\' 11"  (1.803 m)  Wt 169 lb 3.2 oz (76.749 kg)  BMI 23.61 kg/m2  Physical Exam:  Chronically ill appearing 75 yo man, NAD HEENT: Unremarkable Neck:  6 cm JVD, no thyromegally Back:  No CVA tenderness Lungs:  Clear with no wheezes HEART:  Regular rate rhythm, no murmurs, no rubs, no clicks Abd:  soft, positive bowel sounds, no organomegally, no rebound, no guarding Ext:  2 plus pulses, no edema, no cyanosis, no clubbing Skin:  No rashes no nodules Neuro:  CN II through XII intact, motor grossly intact  EKG - NSR with ILBBB and QRS of 120 ms.  Assess/Plan: 1. Chronic systolic heart failure - his symptoms are class 2. He will continue his current meds and activity. He is encouraged to maintain a low sodium diet. 2. PAF - his device suggests he may have had up to 10 minutes of atrial fib. I will follow this. If we see atrial fib for over an hour, would consider systemic anti-coagulation. 3. ICD - his Medtronic device is working normally.  Miguel Roach.D.

## 2016-02-29 NOTE — Addendum Note (Signed)
Addended by: Freada Bergeron on: 02/29/2016 04:38 PM   Modules accepted: Orders

## 2016-03-09 ENCOUNTER — Encounter: Payer: Self-pay | Admitting: Cardiology

## 2016-03-10 ENCOUNTER — Telehealth: Payer: Self-pay

## 2016-03-10 NOTE — Telephone Encounter (Signed)
Patient is self referral from ICM support group.  Attempted call to home and cell phone number.  Message left with number to return call.    My chart enrollment message sent.  1st ICM remote transmission scheduled for 03/28/2016.

## 2016-03-14 NOTE — Telephone Encounter (Signed)
Received call back from patient.  Explained ICM program and he agreed to monthly follow up calls.  1st transmission scheduled for 03/28/2016.  Provided ICM phone number and advised to call for fluid symptoms.  He confirms he uses my chart and advised message was sent with contact information and remote transmission date.

## 2016-03-28 ENCOUNTER — Ambulatory Visit (INDEPENDENT_AMBULATORY_CARE_PROVIDER_SITE_OTHER): Payer: Medicare Other

## 2016-03-28 DIAGNOSIS — I5022 Chronic systolic (congestive) heart failure: Secondary | ICD-10-CM

## 2016-03-28 DIAGNOSIS — Z95810 Presence of automatic (implantable) cardiac defibrillator: Secondary | ICD-10-CM

## 2016-03-28 NOTE — Progress Notes (Signed)
EPIC Encounter for ICM Monitoring  Patient Name: Miguel Roach is a 75 y.o. male Date: 03/28/2016 Primary Care Physican: Horatio Pel, MD Primary Cardiologist: Aundra Dubin Electrophysiologist: Lovena Le Dry Weight: 164 lbs       In the past month, have you:  1. Gained more than 2 pounds in a day or more than 5 pounds in a week? no  2. Had changes in your medications (with verification of current medications)? no  3. Had more shortness of breath than is usual for you? no  4. Limited your activity because of shortness of breath? no  5. Not been able to sleep because of shortness of breath? no  6. Had increased swelling in your feet, ankles, legs or stomach area? no  7. Had symptoms of dehydration (dizziness, dry mouth, increased thirst, decreased urine output) no  8. Had changes in sodium restriction? no  9. Been compliant with medication? Yes  ICM trend: 3 month view for 03/28/2016   ICM trend: 1 year view for 03/28/2016   Follow-up plan: ICM clinic phone appointment 04/13/2016.    FLUID LEVELS:  Optivol thoracic impedance decreased 03/14/2016 to 03/28/2016 suggesting fluid accumulation.    SYMPTOMS:   He denied any symptoms such as weight gain of 3 pounds overnight or 5 pounds within a week, SOB and/or lower extremity swelling.   Advised the device is still creating a fluid level baseline and will continue to monitor.  Encouraged to call for any fluid symptoms.   He reported history of hospitalization for CHF and one of his symptoms was SOB.   He reported a gout flare and will complete a round of prednisone in 2 weeks.    EDUCATION:  Discussed limiting his sodium intake to < 2000 mg and fluid intake to 64 oz daily.     RECOMMENDATIONS: No changes today but will recheck fluid levels in 2 weeks. Advised to watch his sodium intake.      Advised will send to Dr Lovena Le and Dr Aundra Dubin for review.  No recommendations today but will recheck fluid levels in 2 weeks.      Rosalene Billings, RN, CCM 03/28/2016 12:48 PM

## 2016-04-13 ENCOUNTER — Ambulatory Visit (INDEPENDENT_AMBULATORY_CARE_PROVIDER_SITE_OTHER): Payer: Medicare Other

## 2016-04-13 DIAGNOSIS — I5022 Chronic systolic (congestive) heart failure: Secondary | ICD-10-CM

## 2016-04-13 DIAGNOSIS — Z95810 Presence of automatic (implantable) cardiac defibrillator: Secondary | ICD-10-CM

## 2016-04-13 NOTE — Progress Notes (Addendum)
EPIC Encounter for ICM Monitoring  Patient Name: Miguel Roach is a 74 y.o. male Date: 04/13/2016 Primary Care Physican: Horatio Pel, MD Primary Cardiologist: Aundra Dubin Electrophysiologist: Lovena Le Dry Weight: unknown       Heart Failure questions reviewed, pt asymptomatic Thoracic impedence back to baseline Low sodium diet education provided   ICM trend:   Follow-up plan: ICM clinic phone appointment in 05/29/2016.  HF clinic appointment 04/26/2016.  Patient gave permission to leave detailed message on home and cell phone.   Rosalene Billings, RN 04/13/2016 2:14 PM

## 2016-04-26 ENCOUNTER — Ambulatory Visit (HOSPITAL_COMMUNITY)
Admission: RE | Admit: 2016-04-26 | Discharge: 2016-04-26 | Disposition: A | Discharge status: Home / Self Care | Payer: Medicare Other | Source: Ambulatory Visit | Attending: Cardiology | Admitting: Cardiology

## 2016-04-26 ENCOUNTER — Encounter (HOSPITAL_COMMUNITY): Payer: Self-pay

## 2016-04-26 VITALS — BP 110/62 | HR 66 | Wt 168.0 lb

## 2016-04-26 DIAGNOSIS — I5022 Chronic systolic (congestive) heart failure: Secondary | ICD-10-CM | POA: Diagnosis present

## 2016-04-26 DIAGNOSIS — I428 Other cardiomyopathies: Secondary | ICD-10-CM | POA: Diagnosis present

## 2016-04-26 DIAGNOSIS — N183 Chronic kidney disease, stage 3 unspecified: Secondary | ICD-10-CM

## 2016-04-26 DIAGNOSIS — I2510 Atherosclerotic heart disease of native coronary artery without angina pectoris: Secondary | ICD-10-CM | POA: Insufficient documentation

## 2016-04-26 DIAGNOSIS — G4733 Obstructive sleep apnea (adult) (pediatric): Secondary | ICD-10-CM | POA: Diagnosis not present

## 2016-04-26 DIAGNOSIS — M109 Gout, unspecified: Secondary | ICD-10-CM | POA: Diagnosis not present

## 2016-04-26 DIAGNOSIS — Z8249 Family history of ischemic heart disease and other diseases of the circulatory system: Secondary | ICD-10-CM | POA: Diagnosis not present

## 2016-04-26 LAB — BASIC METABOLIC PANEL
Anion gap: 8 (ref 5–15)
BUN: 34 mg/dL — AB (ref 6–20)
CALCIUM: 9.5 mg/dL (ref 8.9–10.3)
CO2: 25 mmol/L (ref 22–32)
CREATININE: 2.85 mg/dL — AB (ref 0.61–1.24)
Chloride: 108 mmol/L (ref 101–111)
GFR calc Af Amer: 24 mL/min — ABNORMAL LOW (ref >60)
GFR, EST NON AFRICAN AMERICAN: 20 mL/min — AB (ref >60)
Glucose, Bld: 102 mg/dL — ABNORMAL HIGH (ref 65–99)
POTASSIUM: 4 mmol/L (ref 3.5–5.1)
SODIUM: 141 mmol/L (ref 135–145)

## 2016-04-26 LAB — DIGOXIN LEVEL: DIGOXIN LVL: 0.8 ng/mL (ref 0.8–2.0)

## 2016-04-26 MED ORDER — HYDRALAZINE HCL 10 MG PO TABS: 20.0000 mg | ORAL_TABLET | Freq: Three times a day (TID) | ORAL | Status: DC

## 2016-04-26 MED ORDER — ISOSORBIDE MONONITRATE ER 30 MG PO TB24: 30.0000 mg | ORAL_TABLET | Freq: Every day | ORAL | Status: DC

## 2016-04-26 NOTE — Patient Instructions (Signed)
Increase Hydralazine to 20 mg (2 tabs) Three times a day   Increase Isosorbide (Imdur) to 30 mg (1 tab) daily  Labs today  Your physician recommends that you schedule a follow-up appointment in: 2 months

## 2016-04-27 NOTE — Progress Notes (Signed)
P  Advanced Heart Failure Clinic Note   Patient ID: Miguel Roach, male   DOB: [birth year], 75 y.o.   MRN: [id number] PCP: Dr. Shelia Media HF: Dr. Aundra Dubin   75 yo with history of chronic systolic CHF/nonischemic cardiomyopathy and CKD presents for CHF clinic evaluation. He actually had an echo back in 2010 with EF 40-45% at the time.  He says that this really was not followed up upon by a cardiologist and that he did well for a number of years after that. In 6/16, he was admitted with acute systolic CHF.  Echo showed EF down to 20-25%.  Cardiac cath showed nonobstructive CAD.  His Lasix has been gradually increased.  This has brought his weight down and improved his breathing, but creatinine has risen. He had been on olmesartan but this was stopped because he thought it was causing cough.  He was then put on losartan, but this was stopped with rise in creatinine. Also intolerant to Bidil with orthostatic symptoms.  He was started on digoxin.  He returns today for followup.  Echo in 12/16 showed persistently low EF, 15-20%.  Medtronic ICD was placed in 2/17.  He still feels good generally.  No orthopnea/PND.  No dyspnea walking on flat ground or up a flight of steps  No lightheadedness or syncope.  He has finished cardiac rehab and is going to the [facility].    Optivol: fluid index < threshold with stable impedance, No VT, no atrial fibrillation, 3 hours/day activity approximately  Labs (6/16): BNP 1954, TSH normal Labs (7/16): K 4.1, creatinine 1.72 Labs (8/16): creatinine 2.3 Labs (9/16): digoxin 0.3, K 4, creatinine 2.14, SPEP negative, UPEP negative Labs (10/16): K 4.6, creatinine 1.62 Labs (11/16): K 4.4, creatinine 2.33, digoxin 0.5 Labs (12/16): creatinine 2.1 (nephrology) Labs (1/17): digoxin 0.6, uric acid 10.4 Labs (2/17): K 4, creatinine 2.13, hgb 11.6 Labs (5/17): K 3.8, creatinine 2.22, digoxin 0.7  PMH: 1. CKD: Follows with Dr Mercy Bulnes. 2. Gout 3. Chronic systolic CHF: Nonischemic  cardiomyopathy.  EF 40-45% in 2010.  Echo (6/16) with EF 20-25%, severe LV dilation, mild MR, mild AI, moderate LAE.  LHC/RHC (6/16) with nonobstructive CAD; mean RA 2, RV 48/1, PA 47/18, mean PCWP 12, CI 3.2.  CPX (10/16) with peak VO2 13.1 (50% predicted), VE/VCO2 slope 39.3, RER 1.22 => moderate to severe functional limitation.  Echo (12/16) with EF 15-20%, severe LV dilation, diffuse hypokinesis, moderate AI, mild MR, normal RV size and systolic function. Medtronic ICD.  4. ACEI cough.  5. Pleural effusion: Thoracentesis on right in 8/16.  It was a transudate, likely due to CHF.  6. OSA: To start Bipap.   SH: Married, retired Pharmacist, hospital, never smoked, no ETOH.   FH: Mother with "weak heart." Brother with sickle cell anemia.   ROS: All systems reviewed and negative except as per HPI.   Current Outpatient Prescriptions  Medication Sig Dispense Refill  . allopurinol (ZYLOPRIM) 100 MG tablet Take 2 tablets (200 mg total) by mouth daily. 60 tablet 6  . carvedilol (COREG) 6.25 MG tablet Take 1 tablet (6.25 mg total) by mouth 2 (two) times daily. 60 tablet 6  . colchicine 0.6 MG tablet Take 0.6 mg by mouth as needed (take 1 tablet every monday wednesday and friday).    Marland Kitchen digoxin (LANOXIN) 0.125 MG tablet TAKE 0.5 TABLETS (0.0625 MG TOTAL) BY MOUTH DAILY. 30 tablet 3  . furosemide (LASIX) 40 MG tablet TAKE 2 TABLETS BY MOUTH EVERY MORNING AND TAKE 1 TABLET  BY MOUTH EVERY EVENING 90 tablet 3  . hydrALAZINE (APRESOLINE) 10 MG tablet Take 2 tablets (20 mg total) by mouth 3 (three) times daily. 180 tablet 3  . isosorbide mononitrate (IMDUR) 30 MG 24 hr tablet Take 1 tablet (30 mg total) by mouth daily. 30 tablet 6  . spironolactone (ALDACTONE) 25 MG tablet TAKE 1 TABLET (25 MG TOTAL) BY MOUTH DAILY. 30 tablet 3  . predniSONE (DELTASONE) 10 MG tablet Take 1 tablet by mouth as directed. Reported on 04/26/2016  1   No current facility-administered medications for this encounter.   BP 110/62 mmHg  Pulse 66   Wt 168 lb (76.204 kg)  SpO2 100%  General: NAD Neck: JVP 7 cm, no thyromegaly or lymphadenopathy.  Lungs: CTA, normal effort CV: Nondisplaced PMI.  Heart regular S1/S2 with wide S2 split, no S3/S4, no murmur.  No edema.  No carotid bruit.  Normal pedal pulses.  Abdomen: Soft, NT, ND, no HSM. +BS Skin: Intact without lesions or rashes.  Neurologic: Alert and oriented x 3.  Psych: Normal affect. Extremities: No clubbing or cyanosis.   Assessment/Plan: 1. Chronic systolic CHF: Nonischemic cardiomyopathy, NYHA class II symptoms.  CO preserved on RHC in 6/16, but 10/16 CPX showed moderate to severe functional impairment (seems worse than his symptoms would suggest). No significant CAD with coronary angiography in 6/16.  No hx heavy ETOH or drug abuse. No marked HTN.  Negative SPEP/UPEP.  Possible prior myocarditis.  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  He now has Medtronic ICD.  On exam, he looks euvolemic.  - Continue Lasix 80 qam/40 qpm.   - Continue spironolactone 25 mg daily.   - Continue Coreg 6.25 mg bid.   - Hold off on ARB or Entresto at this time with elevated creatinine.  - Increase hydralazine to 20 mg tid and Imdur to 30 mg daily.   - Continue digoxin, Check level today.   2. CKD: Stage III.  BMET today. 3. OSA: Bipap. 4. Gout: Gout controlled on allopurinol.  Followup in 2 months.   Miguel Roach 04/27/2016

## 2016-04-28 ENCOUNTER — Other Ambulatory Visit (HOSPITAL_COMMUNITY): Payer: Self-pay | Admitting: *Deleted

## 2016-04-28 ENCOUNTER — Telehealth (HOSPITAL_COMMUNITY): Payer: Self-pay | Admitting: *Deleted

## 2016-04-28 MED ORDER — FUROSEMIDE 40 MG PO TABS: 80.0000 mg | ORAL_TABLET | Freq: Every day | ORAL | Status: DC

## 2016-04-28 NOTE — Telephone Encounter (Signed)
Notes Recorded by Harvie Junior, CMA on 04/28/2016 at 12:01 PM Spoke with pt gave results/medication changes. Pt added to lab schedule 7/17 Notes Recorded by Harvie Junior, CMA on 04/28/2016 at 11:42 AM Left detailed message with lab results/medication change asked pt to call back so that we can schedule lab appointment. Notes Recorded by Larey Dresser, MD on 04/28/2016 at 12:35 AM Hold Lasix x 1 day then decrease Lasix from 80 qam/40 qpm to just 80 mg daily. Needs BMET early next week. Notes Recorded by Scarlette Calico, RN on 04/26/2016 at 4:26 PM CR slightly elevated, Labs reviewed by RN, will forward to MD for review

## 2016-05-01 ENCOUNTER — Ambulatory Visit (HOSPITAL_COMMUNITY)
Admission: RE | Admit: 2016-05-01 | Discharge: 2016-05-01 | Disposition: A | Discharge status: Home / Self Care | Payer: Medicare Other | Source: Ambulatory Visit | Attending: Cardiology | Admitting: Cardiology

## 2016-05-01 DIAGNOSIS — I5022 Chronic systolic (congestive) heart failure: Secondary | ICD-10-CM

## 2016-05-01 LAB — BASIC METABOLIC PANEL
Anion gap: 4 — ABNORMAL LOW (ref 5–15)
BUN: 32 mg/dL — AB (ref 6–20)
CHLORIDE: 111 mmol/L (ref 101–111)
CO2: 26 mmol/L (ref 22–32)
Calcium: 9.4 mg/dL (ref 8.9–10.3)
Creatinine, Ser: 2.6 mg/dL — ABNORMAL HIGH (ref 0.61–1.24)
GFR calc Af Amer: 26 mL/min — ABNORMAL LOW (ref >60)
GFR calc non Af Amer: 23 mL/min — ABNORMAL LOW (ref >60)
Glucose, Bld: 98 mg/dL (ref 65–99)
POTASSIUM: 4 mmol/L (ref 3.5–5.1)
SODIUM: 141 mmol/L (ref 135–145)

## 2016-05-11 ENCOUNTER — Other Ambulatory Visit (HOSPITAL_COMMUNITY): Payer: Self-pay | Admitting: Cardiology

## 2016-05-13 ENCOUNTER — Other Ambulatory Visit (HOSPITAL_COMMUNITY): Payer: Self-pay | Admitting: Cardiology

## 2016-05-16 ENCOUNTER — Other Ambulatory Visit (HOSPITAL_COMMUNITY): Payer: Self-pay | Admitting: Cardiology

## 2016-05-29 ENCOUNTER — Telehealth: Payer: Self-pay

## 2016-05-29 ENCOUNTER — Ambulatory Visit (INDEPENDENT_AMBULATORY_CARE_PROVIDER_SITE_OTHER): Payer: Medicare Other | Admitting: *Deleted

## 2016-05-29 DIAGNOSIS — I5022 Chronic systolic (congestive) heart failure: Secondary | ICD-10-CM | POA: Diagnosis not present

## 2016-05-29 DIAGNOSIS — Z95810 Presence of automatic (implantable) cardiac defibrillator: Secondary | ICD-10-CM | POA: Diagnosis not present

## 2016-05-29 DIAGNOSIS — I428 Other cardiomyopathies: Secondary | ICD-10-CM

## 2016-05-29 NOTE — Progress Notes (Signed)
Remote ICD transmission.   

## 2016-05-29 NOTE — Telephone Encounter (Signed)
Remote ICM transmission received.  Attempted patient call and spoke with wife (DPR)l.  Provided number and requested call back.

## 2016-05-29 NOTE — Progress Notes (Signed)
EPIC Encounter for ICM Monitoring  Patient Name: Miguel Roach is a 75 y.o. male Date: 05/29/2016 Primary Care Physican: Horatio Pel, MD Primary Cardiologist: Aundra Dubin Electrophysiologist: Lovena Le Dry Weight: 165 lb       Heart Failure questions reviewed, pt asymptomatic.  Patient confirmed current Furosemide dosage is 80 mg daily.   Thoracic impedance abnormal suggesting fluid accumulation starting 04/26/2016 and fluid index >threshold which was around same time Furosemide dosage was decreased.  On 04/26/2016 Dr Aundra Dubin decreased Lasix from 80 qam/40 qpm to just 80 mg daily based on 04/26/2016 BMET results.  LABS: 05/01/2016 Creatinine 2.60, BUN 32, Potassium 4.0, Sodium 141 04/26/2016 Creatinine 2.85, BUN 34, Potassium 4.0, Sodium 141 02/16/2016 Creatinine 2.22, BUN 24, Potassium 3.8, Sodium 143 11/24/2015 Creatinine 2.13, BUN 41, Potassium 4.0, Sodium 144 11/19/2015 Creatinine 2.27, BUN 43, Potassium 3.8, Sodium 144 11/03/2015 Creatinine 2.28, BUN 28, Potassium 5.0, Sodium 143  Recommendations:  Copy of ICM check sent to Dr Aundra Dubin and Dr Lovena Le for review and recommendations.  Next HF clinic appointment 06/28/2016.  Follow-up plan: ICM clinic phone appointment on 06/02/2016.     ICM trend: 05/29/2016       Rosalene Billings, RN 05/29/2016 12:18 PM

## 2016-05-29 NOTE — Progress Notes (Signed)
Based on decreased thoracic impedance, would go back to Lasix 80 qam/40 qpm.  He will need BMET in 1 week.

## 2016-05-30 MED ORDER — FUROSEMIDE 40 MG PO TABS: ORAL_TABLET | ORAL | 3 refills | Status: DC

## 2016-05-30 NOTE — Progress Notes (Signed)
Call to patient.  Advised of Dr Claris Gladden recommendation to go back to Lasix 80 qam/40 qpm. Advised will schedule BMET in 1 week, 06/06/2016.  He verbalized understanding.  Repeat ICM remote transmission on 06/02/2016.  HF clinic office visit scheduled for 06/28/2016.

## 2016-05-31 ENCOUNTER — Encounter: Payer: Self-pay | Admitting: Cardiology

## 2016-06-02 ENCOUNTER — Telehealth: Payer: Self-pay | Admitting: Cardiology

## 2016-06-02 ENCOUNTER — Ambulatory Visit (INDEPENDENT_AMBULATORY_CARE_PROVIDER_SITE_OTHER): Payer: Medicare Other

## 2016-06-02 DIAGNOSIS — I5022 Chronic systolic (congestive) heart failure: Secondary | ICD-10-CM

## 2016-06-02 DIAGNOSIS — Z95810 Presence of automatic (implantable) cardiac defibrillator: Secondary | ICD-10-CM

## 2016-06-02 NOTE — Telephone Encounter (Signed)
Spoke with pt and reminded pt of remote transmission that is due today. Pt verbalized understanding.   

## 2016-06-02 NOTE — Progress Notes (Signed)
EPIC Encounter for ICM Monitoring  Patient Name: Miguel Roach is a 75 y.o. male Date: 06/02/2016 Primary Care Physican: Horatio Pel, MD Primary Cardiologist: Aundra Dubin Electrophysiologist: Lovena Le Dry Weight: unknown     Call to patient and spoke with wife (DPR)  Since increase in Furosemide as ordered by Dr Aundra Dubin on 05/29/2016, thoracic impedance returned to normal 06/02/2016.  Recommendations: No changes.      Follow-up plan: ICM clinic phone appointment on 08/01/2016.  HF clinic office appointment 06/28/2016  Copy of ICM check sent to primary cardiologist and device physician for review updated thoracic impedance that returned to normal.   ICM trend: 06/02/2016       Rosalene Billings, RN 06/02/2016 1:02 PM

## 2016-06-06 ENCOUNTER — Other Ambulatory Visit (INDEPENDENT_AMBULATORY_CARE_PROVIDER_SITE_OTHER): Payer: Medicare Other

## 2016-06-06 DIAGNOSIS — I5022 Chronic systolic (congestive) heart failure: Secondary | ICD-10-CM

## 2016-06-06 LAB — BASIC METABOLIC PANEL
BUN: 44 mg/dL — AB (ref 7–25)
CHLORIDE: 105 mmol/L (ref 98–110)
CO2: 26 mmol/L (ref 20–31)
CREATININE: 2.53 mg/dL — AB (ref 0.70–1.18)
Calcium: 9 mg/dL (ref 8.6–10.3)
GLUCOSE: 80 mg/dL (ref 65–99)
POTASSIUM: 4.3 mmol/L (ref 3.5–5.3)
Sodium: 140 mmol/L (ref 135–146)

## 2016-06-06 LAB — CUP PACEART REMOTE DEVICE CHECK
Date Time Interrogation Session: 20170814052405
HighPow Impedance: 62 Ohm
Implantable Lead Implant Date: 20170208
Implantable Lead Location: 753860
Implantable Lead Model: 6935
Lead Channel Pacing Threshold Amplitude: 0.875 V
Lead Channel Pacing Threshold Pulse Width: 0.4 ms
Lead Channel Setting Sensing Sensitivity: 0.3 mV
MDC IDC MSMT BATTERY REMAINING LONGEVITY: 135 mo
MDC IDC MSMT BATTERY VOLTAGE: 3.06 V
MDC IDC MSMT LEADCHNL RV IMPEDANCE VALUE: 304 Ohm
MDC IDC MSMT LEADCHNL RV IMPEDANCE VALUE: 418 Ohm
MDC IDC MSMT LEADCHNL RV SENSING INTR AMPL: 10.375 mV
MDC IDC MSMT LEADCHNL RV SENSING INTR AMPL: 10.375 mV
MDC IDC SET LEADCHNL RV PACING AMPLITUDE: 2 V
MDC IDC SET LEADCHNL RV PACING PULSEWIDTH: 0.4 ms
MDC IDC STAT BRADY RV PERCENT PACED: 0.03 %

## 2016-06-12 ENCOUNTER — Other Ambulatory Visit (HOSPITAL_COMMUNITY): Payer: Self-pay | Admitting: Cardiology

## 2016-06-14 ENCOUNTER — Other Ambulatory Visit (HOSPITAL_COMMUNITY): Payer: Self-pay | Admitting: Cardiology

## 2016-06-28 ENCOUNTER — Encounter (HOSPITAL_COMMUNITY): Payer: Medicare Other

## 2016-07-04 ENCOUNTER — Encounter: Payer: Self-pay | Admitting: Internal Medicine

## 2016-07-21 ENCOUNTER — Encounter (HOSPITAL_COMMUNITY): Payer: Self-pay

## 2016-07-21 ENCOUNTER — Ambulatory Visit (HOSPITAL_COMMUNITY)
Admission: RE | Admit: 2016-07-21 | Discharge: 2016-07-21 | Disposition: A | Discharge status: Home / Self Care | Payer: Medicare Other | Source: Ambulatory Visit | Attending: Internal Medicine | Admitting: Internal Medicine

## 2016-07-21 VITALS — BP 104/51 | HR 62 | Ht 71.0 in | Wt 163.1 lb

## 2016-07-21 DIAGNOSIS — Z79899 Other long term (current) drug therapy: Secondary | ICD-10-CM | POA: Insufficient documentation

## 2016-07-21 DIAGNOSIS — I2510 Atherosclerotic heart disease of native coronary artery without angina pectoris: Secondary | ICD-10-CM | POA: Insufficient documentation

## 2016-07-21 DIAGNOSIS — Z8249 Family history of ischemic heart disease and other diseases of the circulatory system: Secondary | ICD-10-CM | POA: Diagnosis not present

## 2016-07-21 DIAGNOSIS — G4733 Obstructive sleep apnea (adult) (pediatric): Secondary | ICD-10-CM | POA: Diagnosis not present

## 2016-07-21 DIAGNOSIS — I428 Other cardiomyopathies: Secondary | ICD-10-CM | POA: Diagnosis present

## 2016-07-21 DIAGNOSIS — N183 Chronic kidney disease, stage 3 unspecified: Secondary | ICD-10-CM

## 2016-07-21 DIAGNOSIS — I5022 Chronic systolic (congestive) heart failure: Secondary | ICD-10-CM | POA: Diagnosis not present

## 2016-07-21 DIAGNOSIS — M109 Gout, unspecified: Secondary | ICD-10-CM | POA: Insufficient documentation

## 2016-07-21 MED ORDER — HYDRALAZINE HCL 25 MG PO TABS: 25.0000 mg | ORAL_TABLET | Freq: Three times a day (TID) | ORAL | 3 refills | Status: DC

## 2016-07-21 NOTE — Patient Instructions (Signed)
Your physician has recommended you make the following change in your medication: Increase your Hydralazine to 25 mg three times a day.  This has been sent to your pharmacy  Your physician has recommended that you have a cardiopulmonary stress test (CPX). CPX testing is a non-invasive measurement of heart and lung function. It replaces a traditional treadmill stress test. This type of test provides a tremendous amount of information that relates not only to your present condition but also for future outcomes. This test combines measurements of you ventilation, respiratory gas exchange in the lungs, electrocardiogram (EKG), blood pressure and physical response before, during, and following an exercise protocol.  We will contact Dr. Linnell Fulling office for most recent labs

## 2016-07-22 NOTE — Progress Notes (Signed)
P  Advanced Heart Failure Clinic Note   Patient ID: Miguel Roach, male   DOB: [birth year], 75 y.o.   MRN: [id number] PCP: Dr. Shelia Media HF: Dr. Aundra Dubin   75 yo with history of chronic systolic CHF/nonischemic cardiomyopathy and CKD presents for CHF clinic evaluation. He actually had an echo back in 2010 with EF 40-45% at the time.  He says that this really was not followed up upon by a cardiologist and that he did well for a number of years after that. In 6/16, he was admitted with acute systolic CHF.  Echo showed EF down to 20-25%.  Cardiac cath showed nonobstructive CAD.  His Lasix has been gradually increased.  This has brought his weight down and improved his breathing, but creatinine has risen. He had been on olmesartan but this was stopped because he thought it was causing cough.  He was then put on losartan, but this was stopped with rise in creatinine. Also intolerant to Bidil with orthostatic symptoms.  He was started on digoxin.  Echo in 12/16 showed persistently low EF, 15-20%.  Medtronic ICD was placed in 2/17.    He still feels good generally.  No orthopnea/PND.  No dyspnea walking on flat ground or up a flight of steps  SBP running 90s-100s, rare lightheadedness with standing. Able to yard work like cutting grass.  Weight is down 5 lbs.   Labs (6/16): BNP 1954, TSH normal Labs (7/16): K 4.1, creatinine 1.72 Labs (8/16): creatinine 2.3 Labs (9/16): digoxin 0.3, K 4, creatinine 2.14, SPEP negative, UPEP negative Labs (10/16): K 4.6, creatinine 1.62 Labs (11/16): K 4.4, creatinine 2.33, digoxin 0.5 Labs (12/16): creatinine 2.1 (nephrology) Labs (1/17): digoxin 0.6, uric acid 10.4 Labs (2/17): K 4, creatinine 2.13, hgb 11.6 Labs (5/17): K 3.8, creatinine 2.22, digoxin 0.7 Labs (9/17): K 4.4, creatinine 2.88, LFTs normal, digoxin 1.0, hgb 11.8, LDL 88, HDL 50  PMH: 1. CKD: Follows with Dr Mercy Seeman. 2. Gout 3. Chronic systolic CHF: Nonischemic cardiomyopathy.  EF 40-45% in 2010.  Echo  (6/16) with EF 20-25%, severe LV dilation, mild MR, mild AI, moderate LAE.  LHC/RHC (6/16) with nonobstructive CAD; mean RA 2, RV 48/1, PA 47/18, mean PCWP 12, CI 3.2.  CPX (10/16) with peak VO2 13.1 (50% predicted), VE/VCO2 slope 39.3, RER 1.22 => moderate to severe functional limitation.  Echo (12/16) with EF 15-20%, severe LV dilation, diffuse hypokinesis, moderate AI, mild MR, normal RV size and systolic function. Medtronic ICD.  4. ACEI cough.  5. Pleural effusion: Thoracentesis on right in 8/16.  It was a transudate, likely due to CHF.  6. OSA: To start Bipap.   SH: Married, retired Pharmacist, hospital, never smoked, no ETOH.   FH: Mother with "weak heart." Brother with sickle cell anemia.   ROS: All systems reviewed and negative except as per HPI.   Current Outpatient Prescriptions  Medication Sig Dispense Refill  . allopurinol (ZYLOPRIM) 100 MG tablet TAKE 2 TABLETS BY MOUTH DAILY 60 tablet 6  . carvedilol (COREG) 6.25 MG tablet Take 1 tablet (6.25 mg total) by mouth 2 (two) times daily. 60 tablet 6  . digoxin (LANOXIN) 0.125 MG tablet TAKE 0.5 TABLETS (0.0625 MG TOTAL) BY MOUTH DAILY. 30 tablet 3  . furosemide (LASIX) 40 MG tablet Take 2 tablets (80mg  total) by mouth every morning and 1 tablet (40mg ) every evening. 90 tablet 3  . isosorbide mononitrate (IMDUR) 30 MG 24 hr tablet Take 1 tablet (30 mg total) by mouth daily. 30 tablet 6  .  spironolactone (ALDACTONE) 25 MG tablet TAKE 1 TABLET (25 MG TOTAL) BY MOUTH DAILY. 30 tablet 3  . colchicine 0.6 MG tablet Take 0.6 mg by mouth as needed (take 1 tablet every monday wednesday and friday).    . hydrALAZINE (APRESOLINE) 25 MG tablet Take 1 tablet (25 mg total) by mouth 3 (three) times daily. 90 tablet 3  . predniSONE (DELTASONE) 10 MG tablet Take 1 tablet by mouth as directed. Reported on 04/26/2016  1   No current facility-administered medications for this encounter.    BP (!) 104/51 (BP Location: Left Arm, Patient Position: Sitting, Cuff Size:  Normal)   Pulse 62   Ht 5\' 11"  (1.803 m)   Wt 163 lb 1.9 oz (74 kg)   BMI 22.75 kg/m   General: NAD Neck: JVP 7 cm, no thyromegaly or lymphadenopathy.  Lungs: CTA, normal effort CV: Nondisplaced PMI.  Heart regular S1/S2 with wide S2 split, no S3/S4, no murmur.  No edema.  No carotid bruit.  Normal pedal pulses.  Abdomen: Soft, NT, ND, no HSM. +BS Skin: Intact without lesions or rashes.  Neurologic: Alert and oriented x 3.  Psych: Normal affect. Extremities: No clubbing or cyanosis.   Assessment/Plan: 1. Chronic systolic CHF: Nonischemic cardiomyopathy, NYHA class II symptoms.  CO preserved on RHC in 6/16, but 10/16 CPX showed moderate to severe functional impairment (seems worse than his symptoms would suggest). No significant CAD with coronary angiography in 6/16.  No hx heavy ETOH or drug abuse. No marked HTN.  Negative SPEP/UPEP.  Possible prior myocarditis.  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  He now has Medtronic ICD.  On exam, he looks euvolemic.  - Continue Lasix 80 qam/40 qpm.   - Continue spironolactone 25 mg daily.   - Continue Coreg 6.25 mg bid.   - Hold off on ARB or Entresto at this time with elevated creatinine.  - Increase hydralazine to 25 mg tid (rather than 20 tid to decrease pill number) and continue Imdur 30 mg daily.   - Decrease digoxin to 0.0625 every other day with digoxin level 1.0.  - I set up a repeat CPX.  - Repeat echo in 3 months with followup appt.  2. CKD: Stage III.  Creatinine has been slowly up-trending. 3. OSA: Bipap. 4. Gout: Gout controlled on allopurinol.  Followup in 3 months with echo.   [name] 07/22/2016

## 2016-08-01 ENCOUNTER — Ambulatory Visit (INDEPENDENT_AMBULATORY_CARE_PROVIDER_SITE_OTHER): Payer: Medicare Other

## 2016-08-01 ENCOUNTER — Telehealth: Payer: Self-pay | Admitting: Cardiology

## 2016-08-01 DIAGNOSIS — Z95810 Presence of automatic (implantable) cardiac defibrillator: Secondary | ICD-10-CM | POA: Diagnosis not present

## 2016-08-01 DIAGNOSIS — I5022 Chronic systolic (congestive) heart failure: Secondary | ICD-10-CM | POA: Diagnosis not present

## 2016-08-01 NOTE — Progress Notes (Signed)
EPIC Encounter for ICM Monitoring  Patient Name: Miguel Roach is a 75 y.o. male Date: 08/01/2016 Primary Care Physican: Horatio Pel, MD Primary [place] Electrophysiologist: Lovena Le Dry Weight: 160 lb         Heart Failure questions reviewed, pt asymptomatic.    Thoracic impedance normal in the last couple of days.  Impedance has been abnormal 07/08/2016 to 07/31/2016 and patient denied any fluid symptoms.     Recommendations: No changes. Discussed low sodium diet and he reported he is reading food labels for salt amounts. Advised to limit salt intake to 2000 mg daily.  Encouraged to call for fluid symptoms.    Follow-up plan: ICM clinic phone appointment on 09/01/2016.  Copy of ICM check sent to primary cardiologist and device physician.   ICM trend: 08/01/2016       Rosalene Billings, RN 08/01/2016 3:15 PM

## 2016-08-01 NOTE — Telephone Encounter (Signed)
Spoke with pt and reminded pt of remote transmission that is due today. Pt verbalized understanding.   

## 2016-08-16 ENCOUNTER — Ambulatory Visit (HOSPITAL_COMMUNITY): Payer: Medicare Other | Attending: Cardiology

## 2016-08-16 ENCOUNTER — Other Ambulatory Visit (HOSPITAL_COMMUNITY): Payer: Self-pay | Admitting: Cardiology

## 2016-08-16 DIAGNOSIS — I5022 Chronic systolic (congestive) heart failure: Secondary | ICD-10-CM | POA: Insufficient documentation

## 2016-08-16 DIAGNOSIS — R0600 Dyspnea, unspecified: Secondary | ICD-10-CM | POA: Diagnosis not present

## 2016-09-01 ENCOUNTER — Ambulatory Visit (INDEPENDENT_AMBULATORY_CARE_PROVIDER_SITE_OTHER): Payer: Medicare Other | Admitting: *Deleted

## 2016-09-01 ENCOUNTER — Encounter: Payer: Self-pay | Admitting: Cardiology

## 2016-09-01 DIAGNOSIS — Z95810 Presence of automatic (implantable) cardiac defibrillator: Secondary | ICD-10-CM | POA: Diagnosis not present

## 2016-09-01 DIAGNOSIS — I420 Dilated cardiomyopathy: Secondary | ICD-10-CM

## 2016-09-01 DIAGNOSIS — I5022 Chronic systolic (congestive) heart failure: Secondary | ICD-10-CM

## 2016-09-01 NOTE — Progress Notes (Signed)
Remote ICD transmission.   

## 2016-09-01 NOTE — Progress Notes (Signed)
EPIC Encounter for ICM Monitoring  Patient Name: Miguel Roach is a 75 y.o. male Date: 09/01/2016 Primary Care Physican: Horatio Pel, MD Primary [place] Electrophysiologist: Lovena Le Dry Weight:    160 lb      Heart Failure questions reviewed, pt asymptomatic   Thoracic impedance normal   Recommendations: No changes.  Reinforced low salt food choices and limiting fluid intake to < 2 liters per day. Encouraged to call for fluid symptoms.    Follow-up plan: ICM clinic phone appointment on 10/03/2016.  Copy of ICM check sent to device physician.   ICM trend: 09/01/2016       Rosalene Billings, RN 09/01/2016 12:25 PM

## 2016-09-06 ENCOUNTER — Encounter: Payer: Self-pay | Admitting: Cardiology

## 2016-09-09 LAB — CUP PACEART REMOTE DEVICE CHECK
Date Time Interrogation Session: 20171117062203
HIGH POWER IMPEDANCE MEASURED VALUE: 66 Ohm
Implantable Lead Implant Date: 20170208
Implantable Lead Location: 753860
Implantable Pulse Generator Implant Date: 20170208
Lead Channel Pacing Threshold Amplitude: 0.875 V
Lead Channel Pacing Threshold Pulse Width: 0.4 ms
Lead Channel Sensing Intrinsic Amplitude: 10.75 mV
Lead Channel Setting Pacing Pulse Width: 0.4 ms
MDC IDC LEAD MODEL: 6935
MDC IDC MSMT BATTERY REMAINING LONGEVITY: 134 mo
MDC IDC MSMT BATTERY VOLTAGE: 3.04 V
MDC IDC MSMT LEADCHNL RV IMPEDANCE VALUE: 342 Ohm
MDC IDC MSMT LEADCHNL RV IMPEDANCE VALUE: 418 Ohm
MDC IDC MSMT LEADCHNL RV SENSING INTR AMPL: 10.75 mV
MDC IDC SET LEADCHNL RV PACING AMPLITUDE: 2 V
MDC IDC SET LEADCHNL RV SENSING SENSITIVITY: 0.3 mV
MDC IDC STAT BRADY RV PERCENT PACED: 0.02 %

## 2016-09-13 ENCOUNTER — Telehealth (HOSPITAL_COMMUNITY): Payer: Self-pay | Admitting: *Deleted

## 2016-09-13 DIAGNOSIS — J984 Other disorders of lung: Secondary | ICD-10-CM

## 2016-09-13 NOTE — Telephone Encounter (Signed)
Notes Recorded by Scarlette Calico, RN on 09/06/2016 at 3:56 PM EST Pt aware and agreeable, will sch CT and call him back ------  Notes Recorded by Scarlette Calico, RN on 09/06/2016 at 3:24 PM EST Left message to call back ------  Notes Recorded by Larey Dresser, MD on 09/05/2016 at 5:50 PM EST Only mild HF limitation though submaximal. However, there is concern for significant lung disease. Recommend high resolution CT chest w/o contrast to rule out interstitial lung disease.

## 2016-09-18 ENCOUNTER — Ambulatory Visit (HOSPITAL_COMMUNITY)
Admission: RE | Admit: 2016-09-18 | Discharge: 2016-09-18 | Disposition: A | Discharge status: Home / Self Care | Payer: Medicare Other | Source: Ambulatory Visit | Attending: Cardiology | Admitting: Cardiology

## 2016-09-18 DIAGNOSIS — J984 Other disorders of lung: Secondary | ICD-10-CM | POA: Diagnosis present

## 2016-09-18 DIAGNOSIS — I358 Other nonrheumatic aortic valve disorders: Secondary | ICD-10-CM | POA: Insufficient documentation

## 2016-09-18 DIAGNOSIS — K228 Other specified diseases of esophagus: Secondary | ICD-10-CM | POA: Insufficient documentation

## 2016-09-20 ENCOUNTER — Other Ambulatory Visit (HOSPITAL_COMMUNITY): Payer: Self-pay | Admitting: Cardiology

## 2016-09-20 ENCOUNTER — Telehealth (HOSPITAL_COMMUNITY): Payer: Self-pay | Admitting: *Deleted

## 2016-09-20 DIAGNOSIS — K2289 Other specified disease of esophagus: Secondary | ICD-10-CM

## 2016-09-20 DIAGNOSIS — K228 Other specified diseases of esophagus: Secondary | ICD-10-CM

## 2016-09-20 NOTE — Telephone Encounter (Signed)
-----   Message from Larey Dresser, MD sent at 09/18/2016  9:31 PM EST ----- There is some scarring in lung suggestive of prior infection most likely.  No interstitial lung disease . Of concern is a finding in the esophagus. Cannot rule out neoplasm though may be duplication cyst.  HE NEEDS APPT WITH GI ASAP FOR ENDOSCOPIC EVALUATION.

## 2016-09-20 NOTE — Telephone Encounter (Signed)
Notes Recorded by Scarlette Calico, RN on 09/20/2016 at 4:11 PM EST Pt aware, GI referral placed

## 2016-09-21 ENCOUNTER — Other Ambulatory Visit (HOSPITAL_COMMUNITY): Payer: Self-pay | Admitting: Cardiology

## 2016-09-26 ENCOUNTER — Ambulatory Visit (INDEPENDENT_AMBULATORY_CARE_PROVIDER_SITE_OTHER): Payer: Medicare Other | Admitting: Physician Assistant

## 2016-09-26 ENCOUNTER — Encounter: Payer: Self-pay | Admitting: Physician Assistant

## 2016-09-26 VITALS — BP 106/70 | HR 68 | Ht 71.0 in | Wt 170.0 lb

## 2016-09-26 DIAGNOSIS — R938 Abnormal findings on diagnostic imaging of other specified body structures: Secondary | ICD-10-CM | POA: Diagnosis not present

## 2016-09-26 DIAGNOSIS — I509 Heart failure, unspecified: Secondary | ICD-10-CM | POA: Diagnosis not present

## 2016-09-26 DIAGNOSIS — R9389 Abnormal findings on diagnostic imaging of other specified body structures: Secondary | ICD-10-CM

## 2016-09-26 DIAGNOSIS — K228 Other specified diseases of esophagus: Secondary | ICD-10-CM

## 2016-09-26 DIAGNOSIS — K2289 Other specified disease of esophagus: Secondary | ICD-10-CM

## 2016-09-26 DIAGNOSIS — K229 Disease of esophagus, unspecified: Secondary | ICD-10-CM | POA: Diagnosis not present

## 2016-09-26 NOTE — Progress Notes (Signed)
Chief Complaint: Abnormal CT Chest, Esophageal Mass  HPI:  Miguel Roach is a 75 year old male with past medical history of defibrillator placement, anal fistula, CHF, chronic renal insufficiency, gout, hypertension, internal hemorrhoids, nonischemic cardiomyopathy with last echo in 2016 showing an EF of 20-25% as well as sleep apnea,  who was referred to me by Deland Pretty, MD for a complaint of possible esophageal cyst. Patient  has previously followed with Dr. Fuller Plan, most recently seen 09/02/13 for complaint of blood in his stool.   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   Today, the patient presents to clinic and tells me that he recently had a CT and was told he had a lesion in his esophagus. The patient denies any symptoms other than the fact that it seems like his "salivary glands have been more active" over the past 4 months. The patient tells me that he has been having to swallow a little bit more than normal.   Patient denies fever, chills, blood in the stool, melena, weight loss, fatigue, anorexia, nausea, vomiting, heartburn, reflux, dysphagia, history of tobacco use or previous cancers.  Past Medical History:  Diagnosis Date  . Adenomatous colon polyp 2011  . AICD (automatic cardioverter/defibrillator) present   . Anal fistula 2003  . Arthritis   . CHF (congestive heart failure) ([place])   . CRI (chronic renal insufficiency)    creatinine back to normal  . Diverticulosis   . ED (erectile dysfunction)   . Gout   . Hypertension   . Internal hemorrhoids   . Liver lesion 2007  . Nonischemic cardiomyopathy ([place])    EF 20-25% by echo 2016, At that time 40% RCA stenosis, distal 35% circumflex stenosis  . Renal cyst 2007  . Sleep apnea    "had test; never followed thru w/getting mask" (11/25/2015)    Past Surgical History:  Procedure Laterality Date  . ANAL FISTULOTOMY  01/2002  . CARDIAC CATHETERIZATION N/A 04/16/2015   Procedure: Right/Left Heart Cath and Coronary Angiography;  Surgeon: Belva Crome, MD;  Location: [facility];  Service: Cardiovascular;  Laterality: N/A;  . EP IMPLANTABLE DEVICE N/A 11/24/2015   Procedure: ICD Implant;  Surgeon: Evans Lance, MD;  Location: [facility];  Service: Cardiovascular;  Laterality: N/A;  . INSERTION OF ICD  11/24/2015  . TONSILLECTOMY  ~ 1950    Current Outpatient Prescriptions  Medication Sig Dispense Refill  . allopurinol (ZYLOPRIM) 100 MG tablet TAKE 2 TABLETS BY MOUTH DAILY 60 tablet 6  . carvedilol (COREG) 6.25 MG tablet Take 1 tablet (6.25 mg total) by mouth 2 (two) times daily. 60 tablet 6  . colchicine 0.6 MG tablet Take 0.6 mg by mouth as needed (take 1 tablet every monday wednesday and friday).    Marland Kitchen digoxin (LANOXIN) 0.125 MG tablet TAKE 0.5 TABLETS (0.0625 MG TOTAL) BY MOUTH DAILY. 30 tablet 3  . furosemide (LASIX) 40 MG tablet Take 2 tablets (80mg  total) by mouth every morning and 1 tablet (40mg ) every evening. 90 tablet 3  . hydrALAZINE (APRESOLINE) 25 MG tablet Take 1 tablet (25 mg total) by mouth 3 (three) times daily. 90 tablet 3  . isosorbide mononitrate (IMDUR) 30 MG 24 hr tablet Take 1 tablet (30 mg total) by mouth daily. 30 tablet  6  . predniSONE (DELTASONE) 10 MG tablet Take 1 tablet by mouth as directed. Reported on 04/26/2016  1  . spironolactone (ALDACTONE) 25 MG tablet TAKE 1 TABLET BY MOUTH EVERY DAY 30 tablet 3  . spironolactone (ALDACTONE) 25 MG tablet TAKE 1 TABLET BY MOUTH EVERY DAY 30 tablet 3   No current facility-administered medications for this visit.     Allergies as of 09/26/2016 - Review Complete 07/21/2016  Allergen Reaction Noted  . Benicar [olmesartan] Cough 06/15/2015    Family History  Problem Relation Age of Onset  .  Heart failure Mother 88  . Sickle cell anemia Brother   . Breast cancer Sister   . Diabetes Sister   . Diabetes Father     died at age 55  . Diabetes Sister   . Colon polyps Brother   . Clotting disorder      two daughters (inherited from his first wife)    Social History   Social History  . Marital status: Married    Spouse name: N/A  . Number of children: 4  . Years of education: N/A   Occupational History  . Retired Environmental consultant   . Retired Animal nutritionist    Social History Main Topics  . Smoking status: Never Smoker  . Smokeless tobacco: Never Used     Comment: "smoked briefly in my freshman year in college; purchased 1  pack of cigarettes; didn't finish that"  . Alcohol use 0.0 oz/week     Comment: 11/25/2015 "used to drink a beer q now in then back in my 20s"  . Drug use: No  . Sexual activity: Not Currently   Other Topics Concern  . Not on file   Social History Narrative  . No narrative on file    Review of Systems:     Constitutional: No weight loss, fever, chills, weakness or fatigue Skin: No rash Cardiovascular: No chest pain Respiratory: No SOB Gastrointestinal: See HPI and otherwise negative Genitourinary: No dysuria or change in urinary frequency Neurological: No headache Musculoskeletal: No new muscle or joint pain Hematologic: No bleeding Psychiatric: No history of depression or anxiety   Physical Exam:  Vital signs: BP 106/70   Pulse 68   Ht 5\' 11"  (1.803 m)   Wt 170 lb (77.1 kg)   BMI 23.71 kg/m    Constitutional:   Pleasant African American male appears to be in NAD, Well developed, Well nourished, alert and cooperative Head:  Normocephalic and atraumatic. Eyes:   PEERL, EOMI. No icterus. Conjunctiva pink. Ears:  Normal auditory acuity. Neck:  Supple Throat: Oral cavity and pharynx without inflammation, swelling or lesion.  Respiratory: Respirations even and unlabored. Lungs clear to auscultation bilaterally.   No wheezes,  crackles, or rhonchi.  Cardiovascular: Normal S1, S2. No MRG. Regular rate and rhythm. No peripheral edema, cyanosis or pallor.  Gastrointestinal:  Soft, nondistended, nontender. No rebound or guarding. Normal bowel sounds. No appreciable masses or hepatomegaly. Rectal:  Not performed.  Msk:  Symmetrical without gross deformities. Without edema, no deformity or joint abnormality.  Neurologic:  Alert and  oriented x4;  grossly normal neurologically.  Skin:   Dry and intact without significant lesions or rashes. Psychiatric:  Demonstrates good judgement and reason without abnormal affect or behaviors.  MOST RECENT LABS AND IMAGING:  CBC    Component Value Date/Time   WBC 10.8 (H) 11/24/2015 1115   RBC 4.04 (L) 11/24/2015 1115   HGB 11.6 (L) 11/24/2015 1115   HCT  33.6 (L) 11/24/2015 1115   PLT  11/24/2015 1115    PLATELET CLUMPS NOTED ON SMEAR, COUNT APPEARS DECREASED   MCV 83.2 11/24/2015 1115   MCH 28.7 11/24/2015 1115   MCHC 34.5 11/24/2015 1115   RDW 15.3 11/24/2015 1115    CMP     Component Value Date/Time   NA 140 06/06/2016 1018   K 4.3 06/06/2016 1018   CL 105 06/06/2016 1018   CO2 26 06/06/2016 1018   GLUCOSE 80 06/06/2016 1018   BUN 44 (H) 06/06/2016 1018   CREATININE 2.53 (H) 06/06/2016 1018   CALCIUM 9.0 06/06/2016 1018   GFRNONAA 23 (L) 05/01/2016 0945   GFRAA 26 (L) 05/01/2016 0945   CT CHEST WITHOUT CONTRAST 09/18/16  TECHNIQUE: Multidetector CT imaging of the chest was performed following the standard protocol without intravenous contrast. High resolution imaging of the lungs, as well as inspiratory and expiratory imaging, was performed.  COMPARISON:  No priors.  FINDINGS: Cardiovascular: Heart size is normal. There is no significant pericardial fluid, thickening or pericardial calcification. Calcifications of the aortic valve. Atherosclerotic calcifications in the thoracic aorta. No definite coronary artery calcifications. Left-sided  pacemaker/AICD with lead tip terminating in the right ventricular apex.  Mediastinum/Nodes: No pathologically enlarged mediastinal or hilar lymph nodes. Please note that accurate exclusion of hilar adenopathy is limited on noncontrast CT scans. Mass-like enlargement of the esophagus immediately above the level of the carina where the area of concern measures up to 2.1 x 3.3 x 3.2 cm. This apparent lesion measures intermediate attenuation (48 HU) and is concerning for potential neoplasm. No axillary lymphadenopathy.  Lungs/Pleura: High-resolution images demonstrate no significant regions of ground-glass attenuation, subpleural reticulation, traction bronchiectasis or frank honeycombing. There are linear areas of architectural distortion throughout the lung bases bilaterally, most pronounced in the right middle lobe (where there is significant volume loss) and in the lower lobes of the lungs bilaterally, presumably from prior infection/inflammation. Inspiratory and expiratory imaging is unremarkable.  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 Aortic atherosclerosis.  Musculoskeletal: There are no aggressive appearing lytic or blastic lesions noted in the visualized portions of the skeleton.  IMPRESSION: 1. No evidence of interstitial lung disease. 2. There is extensive scarring throughout the visualize lung bases, presumably from prior infections/inflammation. 3. 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 Consultation with Gastroenterology for further evaluation is recommended in the near  future to exclude neoplasm. 4. There are calcifications of the aortic valve. Echocardiographic correlation for evaluation of potential valvular dysfunction may be warranted if clinically indicated. 5. Additional incidental findings, as above.   Electronically Signed   By: Vinnie Langton M.D.   On: 09/18/2016 18:32  Assessment: 1. Abnormal Ct Chest: Showing masslike lesion in the midesophagus as above, must consider neoplasia versus cyst 2. CHF with last EF 20-25%  Plan: 1. Patient needs an EGD for further evaluation of masslike lesion. Discussed risks, benefits, limitations and alternatives and the patient agrees to proceed. This will be scheduled in the hospital due to patient's poor ejection fraction. Will be scheduled with Dr. Fuller Plan. Patient will await timing after Dr. Lynne Leader review tomorrow. 2. Did discuss the patient that he may need an  EUS after EGD if we do not see lesion or cannot get biopsy. 3. Patient to follow in clinic per Dr. Lynne Leader recommendations after time of procedure.  Ellouise Newer, PA-C [facility] 09/26/2016, 2:45 PM  Cc: Deland Pretty, MD

## 2016-09-26 NOTE — Patient Instructions (Signed)
We will call you to schedule your procedure.

## 2016-09-27 ENCOUNTER — Other Ambulatory Visit: Payer: Self-pay | Admitting: Emergency Medicine

## 2016-09-27 DIAGNOSIS — K2289 Other specified disease of esophagus: Secondary | ICD-10-CM

## 2016-09-27 DIAGNOSIS — R9430 Abnormal result of cardiovascular function study, unspecified: Secondary | ICD-10-CM

## 2016-09-27 DIAGNOSIS — K228 Other specified diseases of esophagus: Secondary | ICD-10-CM

## 2016-09-27 NOTE — Progress Notes (Signed)
Reviewed and agree with management plan.  Dorianne Perret T. Desi Carby, MD FACG 

## 2016-09-28 ENCOUNTER — Encounter: Payer: Medicare Other | Admitting: Gastroenterology

## 2016-09-28 ENCOUNTER — Other Ambulatory Visit (HOSPITAL_COMMUNITY): Payer: Self-pay | Admitting: Cardiology

## 2016-09-28 DIAGNOSIS — I5022 Chronic systolic (congestive) heart failure: Secondary | ICD-10-CM

## 2016-09-29 ENCOUNTER — Encounter (HOSPITAL_COMMUNITY): Payer: Self-pay | Admitting: *Deleted

## 2016-09-29 NOTE — Progress Notes (Signed)
Dr. Therisa Doyne, Anesthesia, made aware of pt significant cardiac history; no new orders given.

## 2016-09-29 NOTE — Progress Notes (Signed)
Pt made aware to stop taking Aspirin (doesn't take), vitamins, fish oil, and herbal medications. Do not take any NSAIDs ie: Ibuprofen, Advil, Naproxen, BC and Goody Powder or any medication containing Aspirin. Pt verbalized understanding of all pre-op instructions.

## 2016-10-02 ENCOUNTER — Telehealth: Payer: Self-pay

## 2016-10-02 ENCOUNTER — Ambulatory Visit (HOSPITAL_COMMUNITY): Payer: Medicare Other | Admitting: Anesthesiology

## 2016-10-02 ENCOUNTER — Encounter (HOSPITAL_COMMUNITY)
Admission: RE | Disposition: A | Discharge status: Home / Self Care | Payer: Self-pay | Source: Ambulatory Visit | Attending: Gastroenterology

## 2016-10-02 ENCOUNTER — Ambulatory Visit (HOSPITAL_COMMUNITY)
Admission: RE | Admit: 2016-10-02 | Discharge: 2016-10-02 | Disposition: A | Discharge status: Home / Self Care | Payer: Medicare Other | Source: Ambulatory Visit | Attending: Gastroenterology | Admitting: Gastroenterology

## 2016-10-02 ENCOUNTER — Other Ambulatory Visit: Payer: Self-pay

## 2016-10-02 ENCOUNTER — Encounter (HOSPITAL_COMMUNITY): Payer: Self-pay | Admitting: *Deleted

## 2016-10-02 DIAGNOSIS — I429 Cardiomyopathy, unspecified: Secondary | ICD-10-CM | POA: Insufficient documentation

## 2016-10-02 DIAGNOSIS — R933 Abnormal findings on diagnostic imaging of other parts of digestive tract: Secondary | ICD-10-CM

## 2016-10-02 DIAGNOSIS — I509 Heart failure, unspecified: Secondary | ICD-10-CM | POA: Insufficient documentation

## 2016-10-02 DIAGNOSIS — Z79899 Other long term (current) drug therapy: Secondary | ICD-10-CM | POA: Diagnosis not present

## 2016-10-02 DIAGNOSIS — G4730 Sleep apnea, unspecified: Secondary | ICD-10-CM | POA: Diagnosis not present

## 2016-10-02 DIAGNOSIS — I130 Hypertensive heart and chronic kidney disease with heart failure and stage 1 through stage 4 chronic kidney disease, or unspecified chronic kidney disease: Secondary | ICD-10-CM | POA: Insufficient documentation

## 2016-10-02 DIAGNOSIS — K2289 Other specified disease of esophagus: Secondary | ICD-10-CM

## 2016-10-02 DIAGNOSIS — M1990 Unspecified osteoarthritis, unspecified site: Secondary | ICD-10-CM | POA: Diagnosis not present

## 2016-10-02 DIAGNOSIS — Z95810 Presence of automatic (implantable) cardiac defibrillator: Secondary | ICD-10-CM | POA: Diagnosis not present

## 2016-10-02 DIAGNOSIS — K228 Other specified diseases of esophagus: Secondary | ICD-10-CM

## 2016-10-02 DIAGNOSIS — N189 Chronic kidney disease, unspecified: Secondary | ICD-10-CM | POA: Diagnosis not present

## 2016-10-02 DIAGNOSIS — M109 Gout, unspecified: Secondary | ICD-10-CM | POA: Insufficient documentation

## 2016-10-02 HISTORY — DX: Headache: R51

## 2016-10-02 HISTORY — PX: ESOPHAGOGASTRODUODENOSCOPY (EGD) WITH PROPOFOL: SHX5813

## 2016-10-02 HISTORY — DX: Headache, unspecified: R51.9

## 2016-10-02 SURGERY — ESOPHAGOGASTRODUODENOSCOPY (EGD) WITH PROPOFOL
Anesthesia: Monitor Anesthesia Care

## 2016-10-02 MED ORDER — PROPOFOL 500 MG/50ML IV EMUL
INTRAVENOUS | Status: DC | PRN
  Administered 2016-10-02: 50 ug/kg/min via INTRAVENOUS

## 2016-10-02 MED ORDER — SODIUM CHLORIDE 0.9 % IV SOLN
INTRAVENOUS | Status: DC
  Administered 2016-10-02: 11:00:00 via INTRAVENOUS

## 2016-10-02 MED ORDER — KETAMINE HCL 10 MG/ML IJ SOLN
INTRAMUSCULAR | Status: DC | PRN
  Administered 2016-10-02 (×2): 10 mg via INTRAVENOUS

## 2016-10-02 NOTE — Discharge Instructions (Signed)
YOU HAD AN ENDOSCOPIC PROCEDURE TODAY: Refer to the procedure report and other information in the discharge instructions given to you for any specific questions about what was found during the examination. If this information does not answer your questions, please call [place] office at [phone] to clarify.   YOU SHOULD EXPECT: Some feelings of bloating in the abdomen. Passage of more gas than usual. Walking can help get rid of the air that was put into your GI tract during the procedure and reduce the bloating. If you had a lower endoscopy (such as a colonoscopy or flexible sigmoidoscopy) you may notice spotting of blood in your stool or on the toilet paper. Some abdominal soreness may be present for a day or two, also.  DIET: Your first meal following the procedure should be a light meal and then it is ok to progress to your normal diet. A half-sandwich or bowl of soup is an example of a good first meal. Heavy or fried foods are harder to digest and may make you feel nauseous or bloated. Drink plenty of fluids but you should avoid alcoholic beverages for 24 hours. If you had a esophageal dilation, please see attached instructions for diet.    ACTIVITY: Your care partner should take you home directly after the procedure. You should plan to take it easy, moving slowly for the rest of the day. You can resume normal activity the day after the procedure however YOU SHOULD NOT DRIVE, use power tools, machinery or perform tasks that involve climbing or major physical exertion for 24 hours (because of the sedation medicines used during the test).   SYMPTOMS TO REPORT IMMEDIATELY: A gastroenterologist can be reached at any hour. Please call [phone]  for any of the following symptoms:   Following upper endoscopy (EGD, EUS, ERCP, esophageal dilation) Vomiting of blood or coffee ground material  New, significant abdominal pain  New, significant chest pain or pain under the shoulder blades  Painful or  persistently difficult swallowing  New shortness of breath  Black, tarry-looking or red, bloody stools  FOLLOW UP:  If any biopsies were taken you will be contacted by phone or by letter within the next 1-3 weeks. Call [phone]  if you have not heard about the biopsies in 3 weeks.  Please also call with any specific questions about appointments or follow up tests.  

## 2016-10-02 NOTE — Transfer of Care (Signed)
Immediate Anesthesia Transfer of Care Note  Patient: Miguel Roach  Procedure(s) Performed: Procedure(s): ESOPHAGOGASTRODUODENOSCOPY (EGD) WITH PROPOFOL (N/A)  Patient Location: PACU  Anesthesia Type:MAC  Level of Consciousness: patient cooperative and responds to stimulation  Airway & Oxygen Therapy: Patient Spontanous Breathing and Patient connected to nasal cannula oxygen  Post-op Assessment: Report given to RN and Post -op Vital signs reviewed and stable  Post vital signs: Reviewed and stable  Last Vitals:  Vitals:   10/02/16 1051  BP: 121/63  Pulse: 68  Resp: 13  Temp: 36.7 C    Last Pain:  Vitals:   10/02/16 1051  TempSrc: Oral         Complications: No apparent anesthesia complications

## 2016-10-02 NOTE — H&P (View-Only) (Signed)
Chief Complaint: Abnormal CT Chest, Esophageal Mass  HPI:  [name] is a 75 year old male with past medical history of defibrillator placement, anal fistula, CHF, chronic renal insufficiency, gout, hypertension, internal hemorrhoids, nonischemic cardiomyopathy with last echo in 2016 showing an EF of 20-25% as well as sleep apnea,  who was referred to me by Deland Pretty, MD for a complaint of possible esophageal cyst. Patient  has previously followed with Dr. Fuller Plan, most recently seen 09/02/13 for complaint of blood in his stool.   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   Today, the patient presents to clinic and tells me that he recently had a CT and was told he had a lesion in his esophagus. The patient denies any symptoms other than the fact that it seems like his "salivary glands have been more active" over the past 4 months. The patient tells me that he has been having to swallow a little bit more than normal.   Patient denies fever, chills, blood in the stool, melena, weight loss, fatigue, anorexia, nausea, vomiting, heartburn, reflux, dysphagia, history of tobacco use or previous cancers.  Past Medical History:  Diagnosis Date  . Adenomatous colon polyp 2011  . AICD (automatic cardioverter/defibrillator) present   . Anal fistula 2003  . Arthritis   . CHF (congestive heart failure) ([place])   . CRI (chronic renal insufficiency)    creatinine back to normal  . Diverticulosis   . ED (erectile dysfunction)   . Gout   . Hypertension   . Internal hemorrhoids   . Liver lesion 2007  . Nonischemic cardiomyopathy ([place])    EF 20-25% by echo 2016, At that time 40% RCA stenosis, distal 35% circumflex stenosis  . Renal cyst 2007  . Sleep apnea    "had test; never followed thru w/getting mask" (11/25/2015)    Past Surgical History:  Procedure Laterality Date  . ANAL FISTULOTOMY  01/2002  . CARDIAC CATHETERIZATION N/A 04/16/2015   Procedure: Right/Left Heart Cath and Coronary Angiography;  Surgeon: Belva Crome, MD;  Location: [facility];  Service: Cardiovascular;  Laterality: N/A;  . EP IMPLANTABLE DEVICE N/A 11/24/2015   Procedure: ICD Implant;  Surgeon: Evans Lance, MD;  Location: [facility];  Service: Cardiovascular;  Laterality: N/A;  . INSERTION OF ICD  11/24/2015  . TONSILLECTOMY  ~ 1950    Current Outpatient Prescriptions  Medication Sig Dispense Refill  . allopurinol (ZYLOPRIM) 100 MG tablet TAKE 2 TABLETS BY MOUTH DAILY 60 tablet 6  . carvedilol (COREG) 6.25 MG tablet Take 1 tablet (6.25 mg total) by mouth 2 (two) times daily. 60 tablet 6  . colchicine 0.6 MG tablet Take 0.6 mg by mouth as needed (take 1 tablet every monday wednesday and friday).    Marland Kitchen digoxin (LANOXIN) 0.125 MG tablet TAKE 0.5 TABLETS (0.0625 MG TOTAL) BY MOUTH DAILY. 30 tablet 3  . furosemide (LASIX) 40 MG tablet Take 2 tablets (80mg  total) by mouth every morning and 1 tablet (40mg ) every evening. 90 tablet 3  . hydrALAZINE (APRESOLINE) 25 MG tablet Take 1 tablet (25 mg total) by mouth 3 (three) times daily. 90 tablet 3  . isosorbide mononitrate (IMDUR) 30 MG 24 hr tablet Take 1 tablet (30 mg total) by mouth daily. 30 tablet  6  . predniSONE (DELTASONE) 10 MG tablet Take 1 tablet by mouth as directed. Reported on 04/26/2016  1  . spironolactone (ALDACTONE) 25 MG tablet TAKE 1 TABLET BY MOUTH EVERY DAY 30 tablet 3  . spironolactone (ALDACTONE) 25 MG tablet TAKE 1 TABLET BY MOUTH EVERY DAY 30 tablet 3   No current facility-administered medications for this visit.     Allergies as of 09/26/2016 - Review Complete 07/21/2016  Allergen Reaction Noted  . Benicar [olmesartan] Cough 06/15/2015    Family History  Problem Relation Age of Onset  .  Heart failure Mother 75  . Sickle cell anemia Brother   . Breast cancer Sister   . Diabetes Sister   . Diabetes Father     died at age 41  . Diabetes Sister   . Colon polyps Brother   . Clotting disorder      two daughters (inherited from his first wife)    Social History   Social History  . Marital status: Married    Spouse name: N/A  . Number of children: 4  . Years of education: N/A   Occupational History  . Retired Environmental consultant   . Retired Animal nutritionist    Social History Main Topics  . Smoking status: Never Smoker  . Smokeless tobacco: Never Used     Comment: "smoked briefly in my freshman year in college; purchased 1  pack of cigarettes; didn't finish that"  . Alcohol use 0.0 oz/week     Comment: 11/25/2015 "used to drink a beer q now in then back in my 20s"  . Drug use: No  . Sexual activity: Not Currently   Other Topics Concern  . Not on file   Social History Narrative  . No narrative on file    Review of Systems:     Constitutional: No weight loss, fever, chills, weakness or fatigue Skin: No rash Cardiovascular: No chest pain Respiratory: No SOB Gastrointestinal: See HPI and otherwise negative Genitourinary: No dysuria or change in urinary frequency Neurological: No headache Musculoskeletal: No new muscle or joint pain Hematologic: No bleeding Psychiatric: No history of depression or anxiety   Physical Exam:  Vital signs: BP 106/70   Pulse 68   Ht 5\' 11"  (1.803 m)   Wt 170 lb (77.1 kg)   BMI 23.71 kg/m    Constitutional:   Pleasant African American male appears to be in NAD, Well developed, Well nourished, alert and cooperative Head:  Normocephalic and atraumatic. Eyes:   PEERL, EOMI. No icterus. Conjunctiva pink. Ears:  Normal auditory acuity. Neck:  Supple Throat: Oral cavity and pharynx without inflammation, swelling or lesion.  Respiratory: Respirations even and unlabored. Lungs clear to auscultation bilaterally.   No wheezes,  crackles, or rhonchi.  Cardiovascular: Normal S1, S2. No MRG. Regular rate and rhythm. No peripheral edema, cyanosis or pallor.  Gastrointestinal:  Soft, nondistended, nontender. No rebound or guarding. Normal bowel sounds. No appreciable masses or hepatomegaly. Rectal:  Not performed.  Msk:  Symmetrical without gross deformities. Without edema, no deformity or joint abnormality.  Neurologic:  Alert and  oriented x4;  grossly normal neurologically.  Skin:   Dry and intact without significant lesions or rashes. Psychiatric:  Demonstrates good judgement and reason without abnormal affect or behaviors.  MOST RECENT LABS AND IMAGING:  CBC    Component Value Date/Time   WBC 10.8 (H) 11/24/2015 1115   RBC 4.04 (L) 11/24/2015 1115   HGB 11.6 (L) 11/24/2015 1115   HCT  33.6 (L) 11/24/2015 1115   PLT  11/24/2015 1115    PLATELET CLUMPS NOTED ON SMEAR, COUNT APPEARS DECREASED   MCV 83.2 11/24/2015 1115   MCH 28.7 11/24/2015 1115   MCHC 34.5 11/24/2015 1115   RDW 15.3 11/24/2015 1115    CMP     Component Value Date/Time   NA 140 06/06/2016 1018   K 4.3 06/06/2016 1018   CL 105 06/06/2016 1018   CO2 26 06/06/2016 1018   GLUCOSE 80 06/06/2016 1018   BUN 44 (H) 06/06/2016 1018   CREATININE 2.53 (H) 06/06/2016 1018   CALCIUM 9.0 06/06/2016 1018   GFRNONAA 23 (L) 05/01/2016 0945   GFRAA 26 (L) 05/01/2016 0945   CT CHEST WITHOUT CONTRAST 09/18/16  TECHNIQUE: Multidetector CT imaging of the chest was performed following the standard protocol without intravenous contrast. High resolution imaging of the lungs, as well as inspiratory and expiratory imaging, was performed.  COMPARISON:  No priors.  FINDINGS: Cardiovascular: Heart size is normal. There is no significant pericardial fluid, thickening or pericardial calcification. Calcifications of the aortic valve. Atherosclerotic calcifications in the thoracic aorta. No definite coronary artery calcifications. Left-sided  pacemaker/AICD with lead tip terminating in the right ventricular apex.  Mediastinum/Nodes: No pathologically enlarged mediastinal or hilar lymph nodes. Please note that accurate exclusion of hilar adenopathy is limited on noncontrast CT scans. Mass-like enlargement of the esophagus immediately above the level of the carina where the area of concern measures up to 2.1 x 3.3 x 3.2 cm. This apparent lesion measures intermediate attenuation (48 HU) and is concerning for potential neoplasm. No axillary lymphadenopathy.  Lungs/Pleura: High-resolution images demonstrate no significant regions of ground-glass attenuation, subpleural reticulation, traction bronchiectasis or frank honeycombing. There are linear areas of architectural distortion throughout the lung bases bilaterally, most pronounced in the right middle lobe (where there is significant volume loss) and in the lower lobes of the lungs bilaterally, presumably from prior infection/inflammation. Inspiratory and expiratory imaging is unremarkable.  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 Aortic atherosclerosis.  Musculoskeletal: There are no aggressive appearing lytic or blastic lesions noted in the visualized portions of the skeleton.  IMPRESSION: 1. No evidence of interstitial lung disease. 2. There is extensive scarring throughout the visualize lung bases, presumably from prior infections/inflammation. 3. 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 Consultation with Gastroenterology for further evaluation is recommended in the near  future to exclude neoplasm. 4. There are calcifications of the aortic valve. Echocardiographic correlation for evaluation of potential valvular dysfunction may be warranted if clinically indicated. 5. Additional incidental findings, as above.   Electronically Signed   By: Vinnie Langton M.D.   On: 09/18/2016 18:32  Assessment: 1. Abnormal Ct Chest: Showing masslike lesion in the midesophagus as above, must consider neoplasia versus cyst 2. CHF with last EF 20-25%  Plan: 1. Patient needs an EGD for further evaluation of masslike lesion. Discussed risks, benefits, limitations and alternatives and the patient agrees to proceed. This will be scheduled in the hospital due to patient's poor ejection fraction. Will be scheduled with Dr. Fuller Plan. Patient will await timing after Dr. Lynne Leader review tomorrow. 2. Did discuss the patient that he may need an  EUS after EGD if we do not see lesion or cannot get biopsy. 3. Patient to follow in clinic per Dr. Lynne Leader recommendations after time of procedure.  Ellouise Newer, PA-C [facility] 09/26/2016, 2:45 PM  Cc: Deland Pretty, MD

## 2016-10-02 NOTE — Telephone Encounter (Signed)
EUS scheduled, pt instructed and medications reviewed.  Patient instructions mailed to home.  Patient to call with any questions or concerns.  

## 2016-10-02 NOTE — Telephone Encounter (Signed)
-----   Message from Milus Banister, MD sent at 10/02/2016  1:42 PM EST ----- Sherald Hess it,  Maudy Yonan, He needs upper EUS, radial +/- linear, ++MAC sedation, next available EUS Thursday for esophageal mass.  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  Case d/w Dr. Ardis Hughs. Please schedule EUS.

## 2016-10-02 NOTE — Anesthesia Postprocedure Evaluation (Signed)
Anesthesia Post Note  Patient: Miguel Roach  Procedure(s) Performed: Procedure(s) (LRB): ESOPHAGOGASTRODUODENOSCOPY (EGD) WITH PROPOFOL (N/A)  Patient location during evaluation: Endoscopy Anesthesia Type: MAC Level of consciousness: awake Pain management: pain level controlled Vital Signs Assessment: post-procedure vital signs reviewed and stable Respiratory status: spontaneous breathing Cardiovascular status: blood pressure returned to baseline Postop Assessment: no headache Anesthetic complications: no       Last Vitals:  Vitals:   10/02/16 1253 10/02/16 1305  BP: (!) 105/57 (!) 123/106  Pulse: 63 (!) 59  Resp: 14 13  Temp: 36.8 C     Last Pain:  Vitals:   10/02/16 1253  TempSrc: Oral                 Judeth Cornfield T

## 2016-10-02 NOTE — Op Note (Signed)
[facility] Patient Name: Miguel Roach Procedure Date : 10/02/2016 MRN: [id number] Attending MD: Ladene Artist , MD Date of Birth: [birth year] CSN: [id number] Age: 75 Admit Type: Outpatient Procedure:                Upper GI endoscopy Indications:              Abnormal CT of the GI tract Providers:                Pricilla Riffle. Fuller Plan, MD, Cleda Daub, RN, Corliss Parish, Technician Referring MD:             Deland Pretty, MD Medicines:                Monitored Anesthesia Care Complications:            No immediate complications. Estimated Blood Loss:     Estimated blood loss: none. Procedure:                Pre-Anesthesia Assessment:                           - Prior to the procedure, a History and Physical                            was performed, and patient medications and                            allergies were reviewed. The patient's tolerance of                            previous anesthesia was also reviewed. The risks                            and benefits of the procedure and the sedation                            options and risks were discussed with the patient.                            All questions were answered, and informed consent                            was obtained. Prior Anticoagulants: The patient has                            taken no previous anticoagulant or antiplatelet                            agents. ASA Grade Assessment: III - A patient with                            severe systemic disease. After reviewing the risks  and benefits, the patient was deemed in                            satisfactory condition to undergo the procedure.                           After obtaining informed consent, the endoscope was                            passed under direct vision. Throughout the                            procedure, the patient's blood pressure, pulse, and     oxygen saturations were monitored continuously. The                            EG-[id number] ([id number]) scope was introduced through the                            mouth, and advanced to the second part of duodenum.                            The upper GI endoscopy was accomplished without                            difficulty. The patient tolerated the procedure                            well. Scope In: Scope Out: Findings:      A single 20 mm submucosal nodule with a localized distribution was found       in the mid esophagus, 28 cm from the incisors.      The exam of the esophagus was otherwise normal.      The entire examined stomach was normal.      The duodenal bulb and second portion of the duodenum were normal. Impression:               - Submucosal nodule found in the esophagus.                           - Normal stomach.                           - Normal duodenal bulb and second portion of the                            duodenum.                           - No specimens collected. Moderate Sedation:      none/MAC Recommendation:           - Patient has a contact number available for                            emergencies. The signs and symptoms of potential  delayed complications were discussed with the                            patient. Return to normal activities tomorrow.                            Written discharge instructions were provided to the                            patient.                           - Resume previous diet.                           - Continue present medications. Procedure Code(s):        --- Professional ---                           [phone], Esophagogastroduodenoscopy, flexible,                            transoral; diagnostic, including collection of                            specimen(s) by brushing or washing, when performed                            (separate procedure) Diagnosis Code(s):        --- Professional  ---                           K22.8, Other specified diseases of esophagus                           R93.3, Abnormal findings on diagnostic imaging of                            other parts of digestive tract CPT copyright 2016 American Medical Association. All rights reserved. The codes documented in this report are preliminary and upon coder review may  be revised to meet current compliance requirements. Ladene Artist, MD 10/02/2016 12:56:17 PM This report has been signed electronically. Number of Addenda: 0

## 2016-10-02 NOTE — Interval H&P Note (Signed)
History and Physical Interval Note:  10/02/2016 12:37 PM  Miguel Roach  has presented today for surgery, with the diagnosis of Esophageal mass  The various methods of treatment have been discussed with the patient and family. After consideration of risks, benefits and other options for treatment, the patient has consented to  Procedure(s): ESOPHAGOGASTRODUODENOSCOPY (EGD) WITH PROPOFOL (N/A) as a surgical intervention .  The patient's history has been reviewed, patient examined, no change in status, stable for surgery.  I have reviewed the patient's chart and labs.  Questions were answered to the patient's satisfaction.     Pricilla Riffle. Fuller Plan

## 2016-10-02 NOTE — Anesthesia Preprocedure Evaluation (Signed)
Anesthesia Evaluation  Patient identified by MRN, date of birth, ID band Patient awake    Reviewed: Allergy & Precautions, NPO status , Patient's Chart, lab work & pertinent test results, reviewed documented beta blocker date and time   Airway Mallampati: II  TM Distance: >3 FB Neck ROM: Full    Dental no notable dental hx.    Pulmonary shortness of breath, sleep apnea ,    Pulmonary exam normal breath sounds clear to auscultation       Cardiovascular hypertension, Pt. on medications and Pt. on home beta blockers +CHF  Normal cardiovascular exam+ Cardiac Defibrillator  Rhythm:Regular Rate:Normal  AICD interrogation Longevity 11 yrs. Paced RV 0.2%  Echo 09/2015 - Left ventricle: The cavity size was severely dilated. Systolic function was severely reduced. The estimated ejection fraction was in the range of 15 to 20%. Diffuse hypokinesis. Doppler parameters are consistent with abnormal left ventricular relaxation (grade 1 diastolic dysfunction). Doppler parameters are consistent with elevated ventricular end-diastolic filling pressure. - Aortic valve: There was moderate regurgitation. - Aortic root: The aortic root was normal in size. - Mitral valve: Structurally normal valve. There was mild   regurgitation. - Left atrium: The atrium was moderately dilated. - Right ventricle: Systolic function was normal. - Tricuspid valve: There was trivial regurgitation. - Pulmonic valve: There was mild regurgitation. - Pulmonary arteries: Systolic pressure was within the normal range. - Inferior vena cava: The vessel was normal in size. - Pericardium, extracardiac: There was no pericardial effusion   Neuro/Psych  Headaches, negative psych ROS   GI/Hepatic negative GI ROS, Neg liver ROS,   Endo/Other  negative endocrine ROS  Renal/GU Renal Insufficiency and CRFRenal disease     Musculoskeletal  (+) Arthritis ,   Abdominal   Peds  Hematology negative hematology ROS (+)   Anesthesia Other Findings   Reproductive/Obstetrics negative OB ROS                            Anesthesia Physical Anesthesia Plan  ASA: IV  Anesthesia Plan: MAC   Post-op Pain Management:    Induction: Intravenous  Airway Management Planned:   Additional Equipment:   Intra-op Plan:   Post-operative Plan:   Informed Consent: I have reviewed the patients History and Physical, chart, labs and discussed the procedure including the risks, benefits and alternatives for the proposed anesthesia with the patient or authorized representative who has indicated his/her understanding and acceptance.   Dental advisory given  Plan Discussed with: CRNA  Anesthesia Plan Comments:         Anesthesia Quick Evaluation

## 2016-10-02 NOTE — Interval H&P Note (Signed)
History and Physical Interval Note:  10/02/2016 10:35 AM  Miguel Roach  has presented today for surgery, with the diagnosis of Esophageal mass  The various methods of treatment have been discussed with the patient and family. After consideration of risks, benefits and other options for treatment, the patient has consented to  Procedure(s): ESOPHAGOGASTRODUODENOSCOPY (EGD) WITH PROPOFOL (N/A) as a surgical intervention .  The patient's history has been reviewed, patient examined, no change in status, stable for surgery.  I have reviewed the patient's chart and labs.  Questions were answered to the patient's satisfaction.     Pricilla Riffle. Fuller Plan

## 2016-10-03 ENCOUNTER — Telehealth: Payer: Self-pay

## 2016-10-03 ENCOUNTER — Ambulatory Visit (INDEPENDENT_AMBULATORY_CARE_PROVIDER_SITE_OTHER): Payer: Medicare Other

## 2016-10-03 ENCOUNTER — Encounter (HOSPITAL_COMMUNITY): Payer: Self-pay | Admitting: Gastroenterology

## 2016-10-03 DIAGNOSIS — I5022 Chronic systolic (congestive) heart failure: Secondary | ICD-10-CM

## 2016-10-03 DIAGNOSIS — Z95810 Presence of automatic (implantable) cardiac defibrillator: Secondary | ICD-10-CM | POA: Diagnosis not present

## 2016-10-03 NOTE — Progress Notes (Signed)
EPIC Encounter for ICM Monitoring  Patient Name: Miguel Roach is a 75 y.o. male Date: 10/03/2016 Primary Care Physican: Horatio Pel, MD Primary [place] Electrophysiologist: Druscilla Brownie Weight:unknown               Attempted ICM call and unable to reach. Left detailed message regarding transmission.  Transmission reviewed.   Thoracic impedance normal   Recommendations: Left direct ICM phone number and encouraged to call for fluid symptoms    Follow-up plan: ICM clinic phone appointment on 11/03/2016.  Copy of ICM check sent to device physician.   ICM trend: 10/03/2016       Rosalene Billings, RN 10/03/2016 8:15 AM

## 2016-10-03 NOTE — Telephone Encounter (Signed)
Remote ICM transmission received.  Attempted patient call and left detailed message regarding transmission and next ICM scheduled for 11/03/2016.  Advised to return call for any fluid symptoms or questions.

## 2016-10-05 ENCOUNTER — Telehealth (HOSPITAL_COMMUNITY): Payer: Self-pay | Admitting: Vascular Surgery

## 2016-10-05 NOTE — Telephone Encounter (Signed)
Left pt messag to make appt w/ dm w/ echo

## 2016-10-06 ENCOUNTER — Encounter (HOSPITAL_COMMUNITY): Payer: Self-pay

## 2016-10-10 ENCOUNTER — Other Ambulatory Visit (HOSPITAL_COMMUNITY): Payer: Self-pay | Admitting: *Deleted

## 2016-10-10 DIAGNOSIS — I5022 Chronic systolic (congestive) heart failure: Secondary | ICD-10-CM

## 2016-10-10 MED ORDER — ISOSORBIDE MONONITRATE ER 30 MG PO TB24
30.0000 mg | ORAL_TABLET | Freq: Every day | ORAL | 6 refills | Status: DC
Start: 2016-10-10 — End: 2016-10-11

## 2016-10-10 MED ORDER — HYDRALAZINE HCL 25 MG PO TABS: 25.0000 mg | ORAL_TABLET | Freq: Three times a day (TID) | ORAL | 3 refills | Status: DC

## 2016-10-10 MED ORDER — FUROSEMIDE 40 MG PO TABS
ORAL_TABLET | ORAL | 3 refills | Status: DC
Start: 2016-10-10 — End: 2016-11-15

## 2016-10-11 ENCOUNTER — Other Ambulatory Visit (HOSPITAL_COMMUNITY): Payer: Self-pay | Admitting: *Deleted

## 2016-10-11 ENCOUNTER — Encounter (HOSPITAL_COMMUNITY): Payer: Self-pay

## 2016-10-11 MED ORDER — ISOSORBIDE MONONITRATE ER 30 MG PO TB24: 30.0000 mg | ORAL_TABLET | Freq: Every day | ORAL | 3 refills | Status: DC

## 2016-10-19 ENCOUNTER — Encounter (HOSPITAL_COMMUNITY): Payer: Self-pay | Admitting: *Deleted

## 2016-10-19 ENCOUNTER — Encounter (HOSPITAL_COMMUNITY)
Admission: RE | Disposition: A | Discharge status: Home / Self Care | Payer: Self-pay | Source: Ambulatory Visit | Attending: Gastroenterology

## 2016-10-19 ENCOUNTER — Ambulatory Visit (HOSPITAL_COMMUNITY)
Admission: RE | Admit: 2016-10-19 | Discharge: 2016-10-19 | Disposition: A | Discharge status: Home / Self Care | Payer: Medicare Other | Source: Ambulatory Visit | Attending: Gastroenterology | Admitting: Gastroenterology

## 2016-10-19 ENCOUNTER — Ambulatory Visit (HOSPITAL_COMMUNITY): Payer: Medicare Other | Admitting: Anesthesiology

## 2016-10-19 DIAGNOSIS — I429 Cardiomyopathy, unspecified: Secondary | ICD-10-CM | POA: Insufficient documentation

## 2016-10-19 DIAGNOSIS — I2510 Atherosclerotic heart disease of native coronary artery without angina pectoris: Secondary | ICD-10-CM | POA: Diagnosis not present

## 2016-10-19 DIAGNOSIS — K228 Other specified diseases of esophagus: Secondary | ICD-10-CM | POA: Diagnosis not present

## 2016-10-19 DIAGNOSIS — Z95810 Presence of automatic (implantable) cardiac defibrillator: Secondary | ICD-10-CM | POA: Diagnosis not present

## 2016-10-19 DIAGNOSIS — R933 Abnormal findings on diagnostic imaging of other parts of digestive tract: Secondary | ICD-10-CM

## 2016-10-19 DIAGNOSIS — I509 Heart failure, unspecified: Secondary | ICD-10-CM | POA: Insufficient documentation

## 2016-10-19 DIAGNOSIS — M1990 Unspecified osteoarthritis, unspecified site: Secondary | ICD-10-CM | POA: Diagnosis not present

## 2016-10-19 DIAGNOSIS — G4730 Sleep apnea, unspecified: Secondary | ICD-10-CM | POA: Insufficient documentation

## 2016-10-19 DIAGNOSIS — N189 Chronic kidney disease, unspecified: Secondary | ICD-10-CM | POA: Diagnosis not present

## 2016-10-19 DIAGNOSIS — K2289 Other specified disease of esophagus: Secondary | ICD-10-CM

## 2016-10-19 DIAGNOSIS — I130 Hypertensive heart and chronic kidney disease with heart failure and stage 1 through stage 4 chronic kidney disease, or unspecified chronic kidney disease: Secondary | ICD-10-CM | POA: Insufficient documentation

## 2016-10-19 HISTORY — PX: EUS: SHX5427

## 2016-10-19 SURGERY — UPPER ENDOSCOPIC ULTRASOUND (EUS) RADIAL
Anesthesia: Monitor Anesthesia Care

## 2016-10-19 MED ORDER — PROPOFOL 500 MG/50ML IV EMUL
INTRAVENOUS | Status: DC | PRN
Start: 2016-10-19 — End: 2016-10-19
  Administered 2016-10-19: 140 ug/kg/min via INTRAVENOUS

## 2016-10-19 MED ORDER — EPHEDRINE SULFATE 50 MG/ML IJ SOLN
INTRAMUSCULAR | Status: DC | PRN
  Administered 2016-10-19: 10 mg via INTRAVENOUS

## 2016-10-19 MED ORDER — PROPOFOL 10 MG/ML IV BOLUS
INTRAVENOUS | Status: AC
  Filled 2016-10-19: qty 60

## 2016-10-19 MED ORDER — PROPOFOL 10 MG/ML IV BOLUS
INTRAVENOUS | Status: DC | PRN
  Administered 2016-10-19 (×2): 20 mg via INTRAVENOUS

## 2016-10-19 MED ORDER — LACTATED RINGERS IV SOLN
INTRAVENOUS | Status: DC
  Administered 2016-10-19 (×2): via INTRAVENOUS

## 2016-10-19 MED ORDER — SODIUM CHLORIDE 0.9 % IV SOLN: INTRAVENOUS | Status: DC

## 2016-10-19 MED ORDER — LIDOCAINE 2% (20 MG/ML) 5 ML SYRINGE
INTRAMUSCULAR | Status: AC
  Filled 2016-10-19: qty 5

## 2016-10-19 NOTE — Interval H&P Note (Signed)
History and Physical Interval Note:  10/19/2016 7:59 AM  Miguel Roach  has presented today for surgery, with the diagnosis of esophageal mass   The various methods of treatment have been discussed with the patient and family. After consideration of risks, benefits and other options for treatment, the patient has consented to  Procedure(s): UPPER ENDOSCOPIC ULTRASOUND (EUS) RADIAL (N/A) as a surgical intervention .  The patient's history has been reviewed, patient examined, no change in status, stable for surgery.  I have reviewed the patient's chart and labs.  Questions were answered to the patient's satisfaction.     Milus Banister

## 2016-10-19 NOTE — Transfer of Care (Signed)
Immediate Anesthesia Transfer of Care Note  Patient: Miguel Roach  Procedure(s) Performed: Procedure(s): UPPER ENDOSCOPIC ULTRASOUND (EUS) RADIAL (N/A)  Patient Location: Endoscopy Unit  Anesthesia Type:MAC  Level of Consciousness: awake  Airway & Oxygen Therapy: Patient Spontanous Breathing and Patient connected to nasal cannula oxygen  Post-op Assessment: Report given to RN and Post -op Vital signs reviewed and stable  Post vital signs: Reviewed and stable  Last Vitals:  Vitals:   10/19/16 0751 10/19/16 0858  BP: 136/60   Pulse: (!) 58 71  Resp: 17 18  Temp: 36.4 C     Last Pain:  Vitals:   10/19/16 0858  TempSrc: Oral         Complications: No apparent anesthesia complications

## 2016-10-19 NOTE — Anesthesia Preprocedure Evaluation (Addendum)
Anesthesia Evaluation  Patient identified by MRN, date of birth, ID band Patient awake    Reviewed: Allergy & Precautions, NPO status , Patient's Chart, lab work & pertinent test results, reviewed documented beta blocker date and time   Airway Mallampati: II  TM Distance: >3 FB Neck ROM: Full    Dental no notable dental hx.    Pulmonary shortness of breath, sleep apnea ,    Pulmonary exam normal breath sounds clear to auscultation       Cardiovascular hypertension, Pt. on medications and Pt. on home beta blockers +CHF  Normal cardiovascular exam+ Cardiac Defibrillator  Rhythm:Regular Rate:Normal  AICD interrogation Longevity 11 yrs. Paced RV 0.2%  Echo 09/2015 - Left ventricle: The cavity size was severely dilated. Systolic function was severely reduced. The estimated ejection fraction was in the range of 15 to 20%. Diffuse hypokinesis. Doppler parameters are consistent with abnormal left ventricular relaxation (grade 1 diastolic dysfunction). Doppler parameters are consistent with elevated ventricular end-diastolic filling pressure. - Aortic valve: There was moderate regurgitation. - Aortic root: The aortic root was normal in size. - Mitral valve: Structurally normal valve. There was mild   regurgitation. - Left atrium: The atrium was moderately dilated. - Right ventricle: Systolic function was normal. - Tricuspid valve: There was trivial regurgitation. - Pulmonic valve: There was mild regurgitation. - Pulmonary arteries: Systolic pressure was within the normal range. - Inferior vena cava: The vessel was normal in size. - Pericardium, extracardiac: There was no pericardial effusion   Neuro/Psych  Headaches, negative psych ROS   GI/Hepatic negative GI ROS, Neg liver ROS,   Endo/Other  negative endocrine ROS  Renal/GU Renal Insufficiency and CRFRenal disease     Musculoskeletal  (+) Arthritis ,   Abdominal   Peds  Hematology negative hematology ROS (+)   Anesthesia Other Findings   Reproductive/Obstetrics negative OB ROS                             Anesthesia Physical  Anesthesia Plan  ASA: IV  Anesthesia Plan: MAC   Post-op Pain Management:    Induction: Intravenous  Airway Management Planned:   Additional Equipment:   Intra-op Plan:   Post-operative Plan:   Informed Consent: I have reviewed the patients History and Physical, chart, labs and discussed the procedure including the risks, benefits and alternatives for the proposed anesthesia with the patient or authorized representative who has indicated his/her understanding and acceptance.   Dental advisory given  Plan Discussed with: CRNA  Anesthesia Plan Comments: (Last procedure propofol and ketamine)       Anesthesia Quick Evaluation

## 2016-10-19 NOTE — H&P (View-Only) (Signed)
Chief Complaint: Abnormal CT Chest, Esophageal Mass  HPI:  Miguel Roach is a 76 year old male with past medical history of defibrillator placement, anal fistula, CHF, chronic renal insufficiency, gout, hypertension, internal hemorrhoids, nonischemic cardiomyopathy with last echo in 2016 showing an EF of 20-25% as well as sleep apnea,  who was referred to me by Miguel Pretty, MD for a complaint of possible esophageal cyst. Patient  has previously followed with Dr. Fuller Plan, most recently seen 09/02/13 for complaint of blood in his stool.   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   Today, the patient presents to clinic and tells me that he recently had a CT and was told he had a lesion in his esophagus. The patient denies any symptoms other than the fact that it seems like his "salivary glands have been more active" over the past 4 months. The patient tells me that he has been having to swallow a little bit more than normal.   Patient denies fever, chills, blood in the stool, melena, weight loss, fatigue, anorexia, nausea, vomiting, heartburn, reflux, dysphagia, history of tobacco use or previous cancers.  Past Medical History:  Diagnosis Date  . Adenomatous colon polyp 2011  . AICD (automatic cardioverter/defibrillator) present   . Anal fistula 2003  . Arthritis   . CHF (congestive heart failure) ([place])   . CRI (chronic renal insufficiency)    creatinine back to normal  . Diverticulosis   . ED (erectile dysfunction)   . Gout   . Hypertension   . Internal hemorrhoids   . Liver lesion 2007  . Nonischemic cardiomyopathy ([place])    EF 20-25% by echo 2016, At that time 40% RCA stenosis, distal 35% circumflex stenosis  . Renal cyst 2007  . Sleep apnea    "had test; never followed thru w/getting mask" (11/25/2015)    Past Surgical History:  Procedure Laterality Date  . ANAL FISTULOTOMY  01/2002  . CARDIAC CATHETERIZATION N/A 04/16/2015   Procedure: Right/Left Heart Cath and Coronary Angiography;  Surgeon: Belva Crome, MD;  Location: [facility];  Service: Cardiovascular;  Laterality: N/A;  . EP IMPLANTABLE DEVICE N/A 11/24/2015   Procedure: ICD Implant;  Surgeon: Evans Lance, MD;  Location: [facility];  Service: Cardiovascular;  Laterality: N/A;  . INSERTION OF ICD  11/24/2015  . TONSILLECTOMY  ~ 1950    Current Outpatient Prescriptions  Medication Sig Dispense Refill  . allopurinol (ZYLOPRIM) 100 MG tablet TAKE 2 TABLETS BY MOUTH DAILY 60 tablet 6  . carvedilol (COREG) 6.25 MG tablet Take 1 tablet (6.25 mg total) by mouth 2 (two) times daily. 60 tablet 6  . colchicine 0.6 MG tablet Take 0.6 mg by mouth as needed (take 1 tablet every monday wednesday and friday).    Marland Kitchen digoxin (LANOXIN) 0.125 MG tablet TAKE 0.5 TABLETS (0.0625 MG TOTAL) BY MOUTH DAILY. 30 tablet 3  . furosemide (LASIX) 40 MG tablet Take 2 tablets (80mg  total) by mouth every morning and 1 tablet (40mg ) every evening. 90 tablet 3  . hydrALAZINE (APRESOLINE) 25 MG tablet Take 1 tablet (25 mg total) by mouth 3 (three) times daily. 90 tablet 3  . isosorbide mononitrate (IMDUR) 30 MG 24 hr tablet Take 1 tablet (30 mg total) by mouth daily. 30 tablet  6  . predniSONE (DELTASONE) 10 MG tablet Take 1 tablet by mouth as directed. Reported on 04/26/2016  1  . spironolactone (ALDACTONE) 25 MG tablet TAKE 1 TABLET BY MOUTH EVERY DAY 30 tablet 3  . spironolactone (ALDACTONE) 25 MG tablet TAKE 1 TABLET BY MOUTH EVERY DAY 30 tablet 3   No current facility-administered medications for this visit.     Allergies as of 09/26/2016 - Review Complete 07/21/2016  Allergen Reaction Noted  . Benicar [olmesartan] Cough 06/15/2015    Family History  Problem Relation Age of Onset  .  Heart failure Mother 71  . Sickle cell anemia Brother   . Breast cancer Sister   . Diabetes Sister   . Diabetes Father     died at age 5  . Diabetes Sister   . Colon polyps Brother   . Clotting disorder      two daughters (inherited from his first wife)    Social History   Social History  . Marital status: Married    Spouse name: N/A  . Number of children: 4  . Years of education: N/A   Occupational History  . Retired Environmental consultant   . Retired Animal nutritionist    Social History Main Topics  . Smoking status: Never Smoker  . Smokeless tobacco: Never Used     Comment: "smoked briefly in my freshman year in college; purchased 1  pack of cigarettes; didn't finish that"  . Alcohol use 0.0 oz/week     Comment: 11/25/2015 "used to drink a beer q now in then back in my 20s"  . Drug use: No  . Sexual activity: Not Currently   Other Topics Concern  . Not on file   Social History Narrative  . No narrative on file    Review of Systems:     Constitutional: No weight loss, fever, chills, weakness or fatigue Skin: No rash Cardiovascular: No chest pain Respiratory: No SOB Gastrointestinal: See HPI and otherwise negative Genitourinary: No dysuria or change in urinary frequency Neurological: No headache Musculoskeletal: No new muscle or joint pain Hematologic: No bleeding Psychiatric: No history of depression or anxiety   Physical Exam:  Vital signs: BP 106/70   Pulse 68   Ht 5\' 11"  (1.803 m)   Wt 170 lb (77.1 kg)   BMI 23.71 kg/m    Constitutional:   Pleasant African American male appears to be in NAD, Well developed, Well nourished, alert and cooperative Head:  Normocephalic and atraumatic. Eyes:   PEERL, EOMI. No icterus. Conjunctiva pink. Ears:  Normal auditory acuity. Neck:  Supple Throat: Oral cavity and pharynx without inflammation, swelling or lesion.  Respiratory: Respirations even and unlabored. Lungs clear to auscultation bilaterally.   No wheezes,  crackles, or rhonchi.  Cardiovascular: Normal S1, S2. No MRG. Regular rate and rhythm. No peripheral edema, cyanosis or pallor.  Gastrointestinal:  Soft, nondistended, nontender. No rebound or guarding. Normal bowel sounds. No appreciable masses or hepatomegaly. Rectal:  Not performed.  Msk:  Symmetrical without gross deformities. Without edema, no deformity or joint abnormality.  Neurologic:  Alert and  oriented x4;  grossly normal neurologically.  Skin:   Dry and intact without significant lesions or rashes. Psychiatric:  Demonstrates good judgement and reason without abnormal affect or behaviors.  MOST RECENT LABS AND IMAGING:  CBC    Component Value Date/Time   WBC 10.8 (H) 11/24/2015 1115   RBC 4.04 (L) 11/24/2015 1115   HGB 11.6 (L) 11/24/2015 1115   HCT  33.6 (L) 11/24/2015 1115   PLT  11/24/2015 1115    PLATELET CLUMPS NOTED ON SMEAR, COUNT APPEARS DECREASED   MCV 83.2 11/24/2015 1115   MCH 28.7 11/24/2015 1115   MCHC 34.5 11/24/2015 1115   RDW 15.3 11/24/2015 1115    CMP     Component Value Date/Time   NA 140 06/06/2016 1018   K 4.3 06/06/2016 1018   CL 105 06/06/2016 1018   CO2 26 06/06/2016 1018   GLUCOSE 80 06/06/2016 1018   BUN 44 (H) 06/06/2016 1018   CREATININE 2.53 (H) 06/06/2016 1018   CALCIUM 9.0 06/06/2016 1018   GFRNONAA 23 (L) 05/01/2016 0945   GFRAA 26 (L) 05/01/2016 0945   CT CHEST WITHOUT CONTRAST 09/18/16  TECHNIQUE: Multidetector CT imaging of the chest was performed following the standard protocol without intravenous contrast. High resolution imaging of the lungs, as well as inspiratory and expiratory imaging, was performed.  COMPARISON:  No priors.  FINDINGS: Cardiovascular: Heart size is normal. There is no significant pericardial fluid, thickening or pericardial calcification. Calcifications of the aortic valve. Atherosclerotic calcifications in the thoracic aorta. No definite coronary artery calcifications. Left-sided  pacemaker/AICD with lead tip terminating in the right ventricular apex.  Mediastinum/Nodes: No pathologically enlarged mediastinal or hilar lymph nodes. Please note that accurate exclusion of hilar adenopathy is limited on noncontrast CT scans. Mass-like enlargement of the esophagus immediately above the level of the carina where the area of concern measures up to 2.1 x 3.3 x 3.2 cm. This apparent lesion measures intermediate attenuation (48 HU) and is concerning for potential neoplasm. No axillary lymphadenopathy.  Lungs/Pleura: High-resolution images demonstrate no significant regions of ground-glass attenuation, subpleural reticulation, traction bronchiectasis or frank honeycombing. There are linear areas of architectural distortion throughout the lung bases bilaterally, most pronounced in the right middle lobe (where there is significant volume loss) and in the lower lobes of the lungs bilaterally, presumably from prior infection/inflammation. Inspiratory and expiratory imaging is unremarkable.  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 Aortic atherosclerosis.  Musculoskeletal: There are no aggressive appearing lytic or blastic lesions noted in the visualized portions of the skeleton.  IMPRESSION: 1. No evidence of interstitial lung disease. 2. There is extensive scarring throughout the visualize lung bases, presumably from prior infections/inflammation. 3. 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 Consultation with Gastroenterology for further evaluation is recommended in the near  future to exclude neoplasm. 4. There are calcifications of the aortic valve. Echocardiographic correlation for evaluation of potential valvular dysfunction may be warranted if clinically indicated. 5. Additional incidental findings, as above.   Electronically Signed   By: Vinnie Langton M.D.   On: 09/18/2016 18:32  Assessment: 1. Abnormal Ct Chest: Showing masslike lesion in the midesophagus as above, must consider neoplasia versus cyst 2. CHF with last EF 20-25%  Plan: 1. Patient needs an EGD for further evaluation of masslike lesion. Discussed risks, benefits, limitations and alternatives and the patient agrees to proceed. This will be scheduled in the hospital due to patient's poor ejection fraction. Will be scheduled with Dr. Fuller Plan. Patient will await timing after Dr. Lynne Leader review tomorrow. 2. Did discuss the patient that he may need an  EUS after EGD if we do not see lesion or cannot get biopsy. 3. Patient to follow in clinic per Dr. Lynne Leader recommendations after time of procedure.  Ellouise Newer, PA-C [facility] 09/26/2016, 2:45 PM  Cc: Miguel Pretty, MD

## 2016-10-19 NOTE — Anesthesia Postprocedure Evaluation (Signed)
Anesthesia Post Note  Patient: Miguel Roach  Procedure(s) Performed: Procedure(s) (LRB): UPPER ENDOSCOPIC ULTRASOUND (EUS) RADIAL (N/A)  Patient location during evaluation: PACU Anesthesia Type: MAC Level of consciousness: awake and alert Pain management: pain level controlled Vital Signs Assessment: post-procedure vital signs reviewed and stable Respiratory status: spontaneous breathing, nonlabored ventilation, respiratory function stable and patient connected to nasal cannula oxygen Cardiovascular status: stable and blood pressure returned to baseline Anesthetic complications: no        Last Vitals:  Vitals:   10/19/16 0910 10/19/16 0920  BP: (!) 107/54 (!) 111/58  Pulse: 66 (!) 59  Resp: (!) 21 15  Temp:      Last Pain:  Vitals:   10/19/16 0858  TempSrc: Oral   Pain Goal:                 Riccardo Dubin

## 2016-10-19 NOTE — Op Note (Signed)
[facility] Patient Name: Miguel Roach Procedure Date: 10/19/2016 MRN: [id number] Attending MD: Milus Banister , MD Date of Birth: 01/19/1941 CSN: [id number] Age: 76 Admit Type: Outpatient Procedure:                Upper EUS Indications:              Suspected mass in esophagus on chest CT; recent EGD                            Dr. Fuller Plan found mid esophageal subepithelial mass;                            no weight loss, no dysphagia, mass was found                            incidentally Providers:                Milus Banister, MD, Hilma Favors, RN, Cletis Athens, Technician, Danley Danker, CRNA Referring MD:             Lucio Edward, MD Medicines:                Monitored Anesthesia Care Complications:            No immediate complications. Estimated blood loss:                            None. Estimated Blood Loss:     Estimated blood loss: none. Procedure:                Pre-Anesthesia Assessment:                           - Prior to the procedure, a History and Physical                            was performed, and patient medications and                            allergies were reviewed. The patient's tolerance of                            previous anesthesia was also reviewed. The risks                            and benefits of the procedure and the sedation                            options and risks were discussed with the patient.                            All questions were answered, and informed consent  was obtained. Prior Anticoagulants: The patient has                            taken no previous anticoagulant or antiplatelet                            agents. ASA Grade Assessment: IV - A patient with                            severe systemic disease that is a constant threat                            to life. After reviewing the risks and benefits,                            the patient was  deemed in satisfactory condition to                            undergo the procedure.                           After obtaining informed consent, the endoscope was                            passed under direct vision. Throughout the                            procedure, the patient's blood pressure, pulse, and                            oxygen saturations were monitored continuously. The                            [id number] ([id number]) scope was introduced through                            the mouth, and advanced to the body of the stomach.                            The upper EUS was accomplished without difficulty.                            The patient tolerated the procedure well. Scope In: Scope Out: Findings:      Endoscopic Finding :      There was a soft bulging inward of the mid esophagus, measuring about       2cm across.      The entire examined stomach was endoscopically normal.      The examined duodenum was endoscopically normal.      Endosonographic Finding :      1. A round intramural (subepithelial) lesion was found in the thoracic       esophagus, within the submucosa layer. The lesion was 2.8cm by 2.4cm,       fairly anechoic (cystic) but contained thick, likely proteinacious  fluid. There was the typical posterior enhancement that is classic for a       cystic lesion.      2. No paraesophageal adenopathy.      3. Limited views of pancreas, liver, portal vessels were all normal. 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 This                            is not causing any symptoms and it is not                            pre-cancerous.                           - There is no need for further workup or                            surveillance testing. Moderate Sedation:      N/A- Per Anesthesia Care Recommendation:           - Discharge patient to home  (ambulatory).                           - Resume regular diet.                           - Continue present medications. Procedure Code(s):        --- Professional ---                           [phone], 4, Esophagogastroduodenoscopy, flexible,                            transoral; with endoscopic ultrasound examination                            limited to the esophagus, stomach or duodenum, and                            adjacent structures Diagnosis Code(s):        --- Professional ---                           K22.8, Other specified diseases of esophagus                           R93.3, Abnormal findings on diagnostic imaging of                            other parts of digestive tract CPT copyright 2016 American Medical Association. All rights reserved. The codes documented in this report are preliminary and upon coder review may  be revised to meet current compliance requirements. Milus Banister, MD 10/19/2016 9:12:03  AM This report has been signed electronically. Number of Addenda: 0

## 2016-10-19 NOTE — Discharge Instructions (Signed)

## 2016-10-23 ENCOUNTER — Encounter (HOSPITAL_COMMUNITY): Payer: Self-pay | Admitting: Gastroenterology

## 2016-11-03 ENCOUNTER — Ambulatory Visit (INDEPENDENT_AMBULATORY_CARE_PROVIDER_SITE_OTHER): Payer: Medicare Other

## 2016-11-03 DIAGNOSIS — I5022 Chronic systolic (congestive) heart failure: Secondary | ICD-10-CM | POA: Diagnosis not present

## 2016-11-03 DIAGNOSIS — Z95810 Presence of automatic (implantable) cardiac defibrillator: Secondary | ICD-10-CM | POA: Diagnosis not present

## 2016-11-03 NOTE — Progress Notes (Signed)
EPIC Encounter for ICM Monitoring  Patient Name: Miguel Roach is a 76 y.o. male Date: 11/03/2016 Primary Care Physican: Horatio Pel, MD Primary [place] Electrophysiologist: Druscilla Brownie Weight:unknown      Heart Failure questions reviewed, pt asymptomatic   Thoracic impedance normal   Recommendations: No changes. Discussed limiting dietary salt intake to 2000 mg/day and fluid intake to < 2 liters per day. Encouraged to call for fluid symptoms.  Follow-up plan: ICM clinic phone appointment on 12/26/2016.  Office appointments with HF clinic on 11/14/2016 and Dr Lovena Le 11/22/2016.    Copy of ICM check sent to device physician.   3 month ICM trend: 11/03/2016   1 Year ICM trend:      Rosalene Billings, RN 11/03/2016 12:57 PM

## 2016-11-09 ENCOUNTER — Other Ambulatory Visit (HOSPITAL_COMMUNITY): Payer: Self-pay | Admitting: *Deleted

## 2016-11-09 MED ORDER — ISOSORBIDE MONONITRATE ER 30 MG PO TB24: 30.0000 mg | ORAL_TABLET | Freq: Every day | ORAL | 3 refills | Status: DC

## 2016-11-13 ENCOUNTER — Other Ambulatory Visit (HOSPITAL_COMMUNITY): Payer: Self-pay | Admitting: *Deleted

## 2016-11-13 DIAGNOSIS — I5022 Chronic systolic (congestive) heart failure: Secondary | ICD-10-CM

## 2016-11-14 ENCOUNTER — Ambulatory Visit (HOSPITAL_COMMUNITY)
Admission: RE | Admit: 2016-11-14 | Discharge: 2016-11-14 | Disposition: A | Discharge status: Home / Self Care | Payer: Medicare Other | Source: Ambulatory Visit | Attending: Cardiology | Admitting: Cardiology

## 2016-11-14 ENCOUNTER — Encounter (HOSPITAL_COMMUNITY): Payer: Self-pay

## 2016-11-14 ENCOUNTER — Other Ambulatory Visit: Payer: Self-pay

## 2016-11-14 ENCOUNTER — Other Ambulatory Visit (HOSPITAL_COMMUNITY): Payer: Medicare Other

## 2016-11-14 ENCOUNTER — Ambulatory Visit (HOSPITAL_BASED_OUTPATIENT_CLINIC_OR_DEPARTMENT_OTHER)
Admission: RE | Admit: 2016-11-14 | Discharge: 2016-11-14 | Disposition: A | Discharge status: Home / Self Care | Payer: Medicare Other | Source: Ambulatory Visit

## 2016-11-14 VITALS — BP 98/64 | HR 67 | Wt 173.5 lb

## 2016-11-14 DIAGNOSIS — I351 Nonrheumatic aortic (valve) insufficiency: Secondary | ICD-10-CM | POA: Insufficient documentation

## 2016-11-14 DIAGNOSIS — M109 Gout, unspecified: Secondary | ICD-10-CM | POA: Insufficient documentation

## 2016-11-14 DIAGNOSIS — I5022 Chronic systolic (congestive) heart failure: Secondary | ICD-10-CM | POA: Insufficient documentation

## 2016-11-14 DIAGNOSIS — N183 Chronic kidney disease, stage 3 unspecified: Secondary | ICD-10-CM

## 2016-11-14 DIAGNOSIS — I4510 Unspecified right bundle-branch block: Secondary | ICD-10-CM | POA: Diagnosis not present

## 2016-11-14 DIAGNOSIS — I517 Cardiomegaly: Secondary | ICD-10-CM | POA: Diagnosis not present

## 2016-11-14 DIAGNOSIS — I444 Left anterior fascicular block: Secondary | ICD-10-CM | POA: Insufficient documentation

## 2016-11-14 DIAGNOSIS — Z79899 Other long term (current) drug therapy: Secondary | ICD-10-CM | POA: Insufficient documentation

## 2016-11-14 DIAGNOSIS — I428 Other cardiomyopathies: Secondary | ICD-10-CM | POA: Insufficient documentation

## 2016-11-14 DIAGNOSIS — G4733 Obstructive sleep apnea (adult) (pediatric): Secondary | ICD-10-CM | POA: Insufficient documentation

## 2016-11-14 DIAGNOSIS — I2510 Atherosclerotic heart disease of native coronary artery without angina pectoris: Secondary | ICD-10-CM | POA: Diagnosis not present

## 2016-11-14 DIAGNOSIS — I452 Bifascicular block: Secondary | ICD-10-CM | POA: Diagnosis not present

## 2016-11-14 LAB — ECHOCARDIOGRAM COMPLETE
AVLVOTPG: 5 mmHg
AVPHT: 477 ms
CHL CUP DOP CALC LVOT VTI: 23 cm
CHL CUP STROKE VOLUME: 56 mL
E/e' ratio: 10.92
FS: 17 % — AB (ref 28–44)
IV/PV OW: 1.19
LA ID, A-P, ES: 47 mm
LA diam index: 2.38 cm/m2
LA vol A4C: 44.8 ml
LA vol index: 31.4 mL/m2
LA vol: 61.9 mL
LDCA: 3.8 cm2
LEFT ATRIUM END SYS DIAM: 47 mm
LV PW d: 7.82 mm — AB (ref 0.6–1.1)
LV SIMPSON'S DISK: 30
LV TDI E'LATERAL: 4.35
LV dias vol: 184 mL — AB (ref 62–150)
LV e' LATERAL: 4.35 cm/s
LV sys vol: 128 mL — AB (ref 21–61)
LVDIAVOLIN: 93 mL/m2
LVEEAVG: 10.92
LVEEMED: 10.92
LVOTD: 22 mm
LVOTPV: 117 cm/s
LVOTSV: 87 mL
LVSYSVOLIN: 65 mL/m2
Lateral S' vel: 13.7 cm/s
MV pk A vel: 109 m/s
MVPKEVEL: 47.5 m/s
TDI e' medial: 3.37

## 2016-11-14 LAB — BASIC METABOLIC PANEL
Anion gap: 5 (ref 5–15)
BUN: 43 mg/dL — AB (ref 6–20)
CHLORIDE: 109 mmol/L (ref 101–111)
CO2: 27 mmol/L (ref 22–32)
CREATININE: 3.05 mg/dL — AB (ref 0.61–1.24)
Calcium: 9.6 mg/dL (ref 8.9–10.3)
GFR calc Af Amer: 22 mL/min — ABNORMAL LOW (ref >60)
GFR calc non Af Amer: 19 mL/min — ABNORMAL LOW (ref >60)
GLUCOSE: 98 mg/dL (ref 65–99)
POTASSIUM: 4.6 mmol/L (ref 3.5–5.1)
Sodium: 141 mmol/L (ref 135–145)

## 2016-11-14 LAB — DIGOXIN LEVEL: Digoxin Level: 0.5 ng/mL — ABNORMAL LOW (ref 0.8–2.0)

## 2016-11-14 LAB — BRAIN NATRIURETIC PEPTIDE: B Natriuretic Peptide: 243.5 pg/mL — ABNORMAL HIGH (ref 0.0–100.0)

## 2016-11-14 NOTE — Progress Notes (Signed)
  Echocardiogram 2D Echocardiogram has been performed.  Johny Chess 11/14/2016, 10:46 AM

## 2016-11-14 NOTE — Patient Instructions (Signed)
Labs today  Your physician recommends that you schedule a follow-up appointment in: 3 months  

## 2016-11-15 ENCOUNTER — Telehealth (HOSPITAL_COMMUNITY): Payer: Self-pay | Admitting: *Deleted

## 2016-11-15 DIAGNOSIS — I5022 Chronic systolic (congestive) heart failure: Secondary | ICD-10-CM

## 2016-11-15 MED ORDER — FUROSEMIDE 40 MG PO TABS: 80.0000 mg | ORAL_TABLET | Freq: Every day | ORAL | 3 refills | Status: DC

## 2016-11-15 NOTE — Progress Notes (Signed)
P  Advanced Heart Failure Clinic Note   Patient ID: Miguel Roach, male   DOB: 10/20/1940, 76 y.o.   MRN: [id number] PCP: Dr. Shelia Media Cardiology: Dr. Aundra Dubin   76 yo with history of chronic systolic CHF/nonischemic cardiomyopathy and CKD presents for CHF clinic evaluation. He actually had an echo back in 2010 with EF 40-45% at the time.  He says that this really was not followed up upon by a cardiologist and that he did well for a number of years after that. In 6/16, he was admitted with acute systolic CHF.  Echo showed EF down to 20-25%.  Cardiac cath showed nonobstructive CAD.  His Lasix has been gradually increased.  This has brought his weight down and improved his breathing, but creatinine has risen. He had been on olmesartan but this was stopped because he thought it was causing cough.  He was then put on losartan, but this was stopped with rise in creatinine. Also intolerant to Bidil with orthostatic symptoms.  He was started on digoxin.  Echo in 12/16 showed persistently low EF, 15-20%.  Medtronic ICD was placed in 2/17.  Repeat echo today showed EF 25% with diffuse hypokinesis.   He is feeling good overall.  BP continues to run low, SBP 90s-100s, but no lightheadedness/syncope.  He is exercising 4 days/week at the [facility] in [place].  He walks and does light weights.  No exertional dyspnea, orthopnea, PND, or chest pain.  No palpitations.  He has a nephrologist that he sees regularly.   Optivol: fluid index < threshold, impedance stable, about 3 hrs/day activity, no VT or atrial fibrillation.   ECG: NSR, RBBB, LAFB, poor RWP  Labs (6/16): BNP 1954, TSH normal Labs (7/16): K 4.1, creatinine 1.72 Labs (8/16): creatinine 2.3 Labs (9/16): digoxin 0.3, K 4, creatinine 2.14, SPEP negative, UPEP negative Labs (10/16): K 4.6, creatinine 1.62 Labs (11/16): K 4.4, creatinine 2.33, digoxin 0.5 Labs (12/16): creatinine 2.1 (nephrology) Labs (1/17): digoxin 0.6, uric acid 10.4 Labs (2/17): K 4,  creatinine 2.13, hgb 11.6 Labs (5/17): K 3.8, creatinine 2.22, digoxin 0.7 Labs (9/17): K 4.4, creatinine 2.88, LFTs normal, digoxin 1.0, hgb 11.8, LDL 88, HDL 50  PMH: 1. CKD: Stage III.  Follows with Dr Mercy Spera. 2. Gout 3. Chronic systolic CHF: Nonischemic cardiomyopathy.  EF 40-45% in 2010.  Echo (6/16) with EF 20-25%, severe LV dilation, mild MR, mild AI, moderate LAE.  LHC/RHC (6/16) with nonobstructive CAD; mean RA 2, RV 48/1, PA 47/18, mean PCWP 12, CI 3.2.  CPX (10/16) with peak VO2 13.1 (50% predicted), VE/VCO2 slope 39.3, RER 1.22 => moderate to severe functional limitation.  Echo (12/16) with EF 15-20%, severe LV dilation, diffuse hypokinesis, moderate AI, mild MR, normal RV size and systolic function. Medtronic ICD.  - CPX (11/17): Submaximal with RER 0.95, VE/VCO2 slope 27, peak VO2 18.2.  Suspect no more than mild HF limitation.  - Echo (1/18): EF 25%, diffuse hypokinesis, normal RV size and systolic function, mild AI.  4. ACEI cough.  5. Pleural effusion: Thoracentesis on right in 8/16.  It was a transudate, likely due to CHF.  6. OSA: To start Bipap.  7. Esophageal duplication cyst.  8. High resolution CT chest 12/17: No evidence for interstitial lung disease.   SH: Married, retired Pharmacist, hospital, never smoked, no ETOH.   FH: Mother with "weak heart." Brother with sickle cell anemia.   ROS: All systems reviewed and negative except as per HPI.   Current Outpatient Prescriptions  Medication Sig  Dispense Refill  . allopurinol (ZYLOPRIM) 100 MG tablet TAKE 2 TABLETS BY MOUTH DAILY 60 tablet 6  . carvedilol (COREG) 6.25 MG tablet TABLET BY MOUTH TWICE A DAY 60 tablet 6  . colchicine 0.6 MG tablet Take 0.6 mg by mouth as needed (take 1 tablet every monday wednesday and friday).    Marland Kitchen digoxin (LANOXIN) 0.125 MG tablet Take 0.0625 mg by mouth every other day.    . furosemide (LASIX) 40 MG tablet Take 2 tablets (80mg  total) by mouth every morning and 1 tablet (40mg ) every evening. 270  tablet 3  . hydrALAZINE (APRESOLINE) 25 MG tablet Take 1 tablet (25 mg total) by mouth 3 (three) times daily. 270 tablet 3  . isosorbide mononitrate (IMDUR) 30 MG 24 hr tablet Take 1 tablet (30 mg total) by mouth daily. 90 tablet 3  . spironolactone (ALDACTONE) 25 MG tablet TAKE 1 TABLET BY MOUTH EVERY DAY 30 tablet 3   No current facility-administered medications for this encounter.    BP 98/64   Pulse 67   Wt 173 lb 8 oz (78.7 kg)   SpO2 100%   BMI 24.20 kg/m   General: NAD Neck: JVP 7 cm, no thyromegaly or lymphadenopathy.  Lungs: CTA, normal effort CV: Nondisplaced PMI.  Heart regular S1/S2 with wide S2 split, no S3/S4, no murmur.  No edema.  No carotid bruit.  Normal pedal pulses.  Abdomen: Soft, NT, ND, no HSM. +BS Skin: Intact without lesions or rashes.  Neurologic: Alert and oriented x 3.  Psych: Normal affect. Extremities: No clubbing or cyanosis.   Assessment/Plan: 1. Chronic systolic CHF: Nonischemic cardiomyopathy, NYHA class II symptoms.  CO preserved on RHC in 6/16, but 10/16 CPX showed moderate to severe functional impairment (seems worse than his symptoms would suggest).  However, repeat CPX in 11/17, though submaximal, suggested no more than mild HF limitation. No significant CAD with coronary angiography in 6/16.  No hx heavy ETOH or drug abuse. No marked HTN.  Negative SPEP/UPEP.  Possible prior myocarditis.  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  He now has Medtronic ICD.  Today's echo was reviewed, EF 25% with normal RV size and systolic function.  On exam, he looks euvolemic, NYHA class II symptoms.  - Continue Lasix 80 qam/40 qpm.  BMET/BNP today.  - Continue spironolactone 25 mg daily.   - Continue Coreg 6.25 mg bid.  He does not have BP room to increase.  - Hold off on ACEI/ARB/Entresto at this time with elevated creatinine.  - Continue hydralazine 25 mg tid and Imdur 30 mg daily.  He does not have the BP room to increase.  -  Continue digoxin 0.0625 every other day, check level today.   2. CKD: Stage III.  Creatinine has been slowly up-trending.  BMET today.  He sees nephrology.  3. OSA: Bipap. 4. Gout: Gout controlled on allopurinol.  Followup in 3 months.   [name] 11/15/2016

## 2016-11-15 NOTE — Telephone Encounter (Signed)
Notes Recorded by Kennieth Rad, RN on 11/15/2016 at 4:24 PM EST Called patient and he is agreeable with plan. Medication updated in epic, lab orders placed, and appointment made for next week. ------  Notes Recorded by Harvie Junior, CMA on 11/15/2016 at 2:15 PM EST Left vm for pt to call for lab results ------  Notes Recorded by Larey Dresser, MD on 11/14/2016 at 10:08 PM EST Creatinine higher, decrease Lasix to 80 mg daily. Repeat BMET in 1 week.

## 2016-11-22 ENCOUNTER — Ambulatory Visit (HOSPITAL_COMMUNITY)
Admission: RE | Admit: 2016-11-22 | Discharge: 2016-11-22 | Disposition: A | Discharge status: Home / Self Care | Payer: Medicare Other | Source: Ambulatory Visit | Attending: Internal Medicine | Admitting: Internal Medicine

## 2016-11-22 ENCOUNTER — Encounter: Payer: Self-pay | Admitting: Internal Medicine

## 2016-11-22 ENCOUNTER — Other Ambulatory Visit: Payer: Self-pay | Admitting: Internal Medicine

## 2016-11-22 ENCOUNTER — Ambulatory Visit (INDEPENDENT_AMBULATORY_CARE_PROVIDER_SITE_OTHER): Payer: Medicare Other | Admitting: Internal Medicine

## 2016-11-22 VITALS — BP 116/60 | HR 64 | Ht 71.0 in | Wt 175.2 lb

## 2016-11-22 DIAGNOSIS — I428 Other cardiomyopathies: Secondary | ICD-10-CM | POA: Diagnosis not present

## 2016-11-22 DIAGNOSIS — Z95810 Presence of automatic (implantable) cardiac defibrillator: Secondary | ICD-10-CM

## 2016-11-22 DIAGNOSIS — I5022 Chronic systolic (congestive) heart failure: Secondary | ICD-10-CM | POA: Insufficient documentation

## 2016-11-22 LAB — BASIC METABOLIC PANEL
ANION GAP: 7 (ref 5–15)
BUN: 39 mg/dL — ABNORMAL HIGH (ref 6–20)
CALCIUM: 9.6 mg/dL (ref 8.9–10.3)
CO2: 26 mmol/L (ref 22–32)
Chloride: 109 mmol/L (ref 101–111)
Creatinine, Ser: 2.96 mg/dL — ABNORMAL HIGH (ref 0.61–1.24)
GFR, EST AFRICAN AMERICAN: 22 mL/min — AB (ref >60)
GFR, EST NON AFRICAN AMERICAN: 19 mL/min — AB (ref >60)
Glucose, Bld: 70 mg/dL (ref 65–99)
Potassium: 4.3 mmol/L (ref 3.5–5.1)
Sodium: 142 mmol/L (ref 135–145)

## 2016-11-22 NOTE — Patient Instructions (Signed)
Medication Instructions:  Your physician recommends that you continue on your current medications as directed. Please refer to the Current Medication list given to you today.   Labwork: None Ordered   Testing/Procedures: None Ordered   Follow-Up: Your physician wants you to follow-up in: 1 year with Dr. Lovena Le. You will receive a reminder letter in the mail two months in advance. If you don't receive a letter, please call our office to schedule the follow-up appointment.  Remote monitoring is used to monitor your ICD from home. This monitoring reduces the number of office visits required to check your device to one time per year. It allows [place] to keep an eye on the functioning of your device to ensure it is working properly. You are scheduled for a device check from home on 02/21/17. You may send your transmission at any time that day. If you have a wireless device, the transmission will be sent automatically. After your physician reviews your transmission, you will receive a postcard with your next transmission date.    Any Other Special Instructions Will Be Listed Below (If Applicable). Maintain a low sodium diet    If you need a refill on your cardiac medications before your next appointment, please call your pharmacy.

## 2016-11-22 NOTE — Progress Notes (Signed)
HPI Miguel Roach is returns today for ongoing followup of his ICD. He is a pleasant 76 yo man who has a h/o LV dysfunction dating back 6-7 years. His EF is 30%. He is on maximal medical therapy. Since his ICD was placed he has done well. He is traveling but does admit to dietary indiscretion. He has graduated from cardiac rehab and remains active. Allergies  Allergen Reactions  . Benicar [Olmesartan] Cough     Current Outpatient Prescriptions  Medication Sig Dispense Refill  . allopurinol (ZYLOPRIM) 100 MG tablet TAKE 2 TABLETS BY MOUTH DAILY 60 tablet 6  . carvedilol (COREG) 6.25 MG tablet TABLET BY MOUTH TWICE A DAY 60 tablet 6  . colchicine 0.6 MG tablet Take 0.6 mg by mouth as needed (take 1 tablet every monday wednesday and friday).    Marland Kitchen digoxin (LANOXIN) 0.125 MG tablet Take 0.0625 mg by mouth every other day.    . furosemide (LASIX) 40 MG tablet Take 2 tablets (80 mg total) by mouth daily. 180 tablet 3  . hydrALAZINE (APRESOLINE) 25 MG tablet Take 1 tablet (25 mg total) by mouth 3 (three) times daily. 270 tablet 3  . isosorbide mononitrate (IMDUR) 30 MG 24 hr tablet Take 1 tablet (30 mg total) by mouth daily. 90 tablet 3  . spironolactone (ALDACTONE) 25 MG tablet TAKE 1 TABLET BY MOUTH EVERY DAY 30 tablet 3   No current facility-administered medications for this visit.      Past Medical History:  Diagnosis Date  . Adenomatous colon polyp 2011  . AICD (automatic cardioverter/defibrillator) present   . Anal fistula 2003  . Arthritis   . CHF (congestive heart failure) ([place])   . CRI (chronic renal insufficiency)    creatinine back to normal  . Diverticulosis   . ED (erectile dysfunction)   . Gout   . Headache   . Hypertension   . Internal hemorrhoids   . Liver lesion 2007  . Nonischemic cardiomyopathy ([place])    EF 20-25% by echo 2016, At that time 40% RCA stenosis, distal 35% circumflex stenosis  . Renal cyst 2007   "I'm not familiar with that, but I follow up on my  creatinine because my kidney function is low." 09/2016  . Sleep apnea    "had test; never followed thru w/getting mask" (11/25/2015)    ROS:   All systems reviewed and negative except as noted in the HPI.   Past Surgical History:  Procedure Laterality Date  . ANAL FISTULOTOMY  01/2002  . CARDIAC CATHETERIZATION N/A 04/16/2015   Procedure: Right/Left Heart Cath and Coronary Angiography;  Surgeon: Belva Crome, MD;  Location: [place] [facility];  Service: Cardiovascular;  Laterality: N/A;  . EP IMPLANTABLE DEVICE N/A 11/24/2015   Procedure: ICD Implant;  Surgeon: Evans Lance, MD;  Location: [place] [facility];  Service: Cardiovascular;  Laterality: N/A;  . ESOPHAGOGASTRODUODENOSCOPY (EGD) WITH PROPOFOL N/A 10/02/2016   Procedure: ESOPHAGOGASTRODUODENOSCOPY (EGD) WITH PROPOFOL;  Surgeon: Ladene Artist, MD;  Location[facility] ENDOSCOPY;  Service: Endoscopy;  Laterality: N/A;  . EUS N/A 10/19/2016   Procedure: UPPER ENDOSCOPIC ULTRASOUND (EUS) RADIAL;  Surgeon: Milus Banister, MD;  Location: [facility] ENDOSCOPY;  Service: Endoscopy;  Laterality: N/A;  . INSERTION OF ICD  11/24/2015  . TONSILLECTOMY  ~ 1950     Family History  Problem Relation Age of Onset  . Heart failure Mother 54  . Sickle cell anemia Brother   . Breast cancer  Sister   . Diabetes Sister   . Diabetes Father     died at age 3  . Diabetes Sister   . Colon polyps Brother   . Clotting disorder      two daughters (inherited from his first wife)     Social History   Social History  . Marital status: Married    Spouse name: Miguel Roach  . Number of children: 4  . Years of education: N/A   Occupational History  . Retired Environmental consultant   . Retired Animal nutritionist    Social History Main Topics  . Smoking status: Never Smoker  . Smokeless tobacco: Never Used     Comment: "smoked briefly in my freshman year in college; purchased 1  pack of cigarettes; didn't finish that"  . Alcohol use No     Comment: 11/25/2015 "used to drink  a beer q now in then back in my 20s"  . Drug use: No  . Sexual activity: Not Currently   Other Topics Concern  . Not on file   Social History Narrative  . No narrative on file     BP 116/60 (BP Location: Left Arm, Patient Position: Sitting, Cuff Size: Normal)   Pulse 64   Ht 5\' 11"  (1.803 m)   Wt 175 lb 3.2 oz (79.5 kg)   SpO2 96%   BMI 24.44 kg/m   Physical Exam:  Chronically ill appearing 76 yo man, NAD HEENT: Unremarkable Neck:  6 cm JVD, no thyromegally Back:  No CVA tenderness Lungs:  Clear with no wheezes HEART:  Regular rate rhythm, no murmurs, no rubs, no clicks Abd:  soft, positive bowel sounds, no organomegally, no rebound, no guarding Ext:  2 plus pulses, no edema, no cyanosis, no clubbing Skin:  No rashes no nodules Neuro:  CN II through XII intact, motor grossly intact  ICD interogation - normal Medtronic ICD function  Assess/Plan: 1. Chronic systolic heart failure - his symptoms are class 2. He will continue his current meds and activity. He is encouraged to maintain a low sodium diet. 2. PAF - his device suggests he may have had up to 10 minutes of atrial fib. I will follow this. If we see atrial fib for over an hour, would consider systemic anti-coagulation. 3. ICD - his Medtronic device is working normally. Will recheck in several months.  Miguel Roach.D.

## 2016-11-23 ENCOUNTER — Telehealth (HOSPITAL_COMMUNITY): Payer: Self-pay | Admitting: *Deleted

## 2016-11-23 DIAGNOSIS — I5022 Chronic systolic (congestive) heart failure: Secondary | ICD-10-CM

## 2016-11-23 NOTE — Telephone Encounter (Signed)
Basic metabolic panel  Order: [id number]  Status:  Final result Visible to patient:  Yes (MyChart) Dx:  Chronic systolic CHF (congestive hear...  Notes Recorded by Kennieth Rad, RN on 11/23/2016 at 12:01 PM EST Spoke with patient and he is agreeable. Appointment made, lab order placed. ------  Notes Recorded by Harvie Junior, [place] on 11/23/2016 at 9:18 AM EST Left vm for pt to call for lab results ------  Notes Recorded by Larey Dresser, MD on 11/22/2016 at 11:58 AM EST Creatinine is a bit lower but still elevated. Would repeat in 3 wks.

## 2016-12-06 ENCOUNTER — Other Ambulatory Visit (HOSPITAL_COMMUNITY): Payer: Self-pay | Admitting: Cardiology

## 2016-12-08 ENCOUNTER — Other Ambulatory Visit (HOSPITAL_COMMUNITY): Payer: Self-pay | Admitting: Cardiology

## 2016-12-08 MED ORDER — ISOSORBIDE MONONITRATE ER 30 MG PO TB24: 30.0000 mg | ORAL_TABLET | Freq: Every day | ORAL | 3 refills | Status: DC

## 2016-12-14 ENCOUNTER — Ambulatory Visit (HOSPITAL_COMMUNITY)
Admission: RE | Admit: 2016-12-14 | Discharge: 2016-12-14 | Disposition: A | Discharge status: Home / Self Care | Payer: Medicare Other | Source: Ambulatory Visit | Attending: Internal Medicine | Admitting: Internal Medicine

## 2016-12-14 DIAGNOSIS — I5022 Chronic systolic (congestive) heart failure: Secondary | ICD-10-CM | POA: Insufficient documentation

## 2016-12-14 LAB — BASIC METABOLIC PANEL
ANION GAP: 5 (ref 5–15)
BUN: 41 mg/dL — ABNORMAL HIGH (ref 6–20)
CALCIUM: 9.4 mg/dL (ref 8.9–10.3)
CHLORIDE: 108 mmol/L (ref 101–111)
CO2: 26 mmol/L (ref 22–32)
Creatinine, Ser: 3.17 mg/dL — ABNORMAL HIGH (ref 0.61–1.24)
GFR calc non Af Amer: 18 mL/min — ABNORMAL LOW (ref >60)
GFR, EST AFRICAN AMERICAN: 21 mL/min — AB (ref >60)
GLUCOSE: 100 mg/dL — AB (ref 65–99)
POTASSIUM: 4.4 mmol/L (ref 3.5–5.1)
Sodium: 139 mmol/L (ref 135–145)

## 2016-12-20 ENCOUNTER — Telehealth: Payer: Self-pay | Admitting: *Deleted

## 2016-12-20 MED ORDER — CARVEDILOL 12.5 MG PO TABS: 12.5000 mg | ORAL_TABLET | Freq: Two times a day (BID) | ORAL | 3 refills | Status: DC

## 2016-12-20 NOTE — Telephone Encounter (Signed)
Episode discussed with Chanetta Marshall, NP.   Amber recommended that patient increase his Coreg to 12.5mg  BID and f/u with Dr.Taylor first available.  Informed patient of recommendations. Patient and wife voiced understanding.  New RX sent to pharmacy. Will defer scheduling to [facility].

## 2016-12-20 NOTE — Telephone Encounter (Signed)
Spoke to patient regarding ICD shock x 1 from today. Patient states that he was out mowing the grass when the shock occurred. He denied any CP, ShOB, dizziness, or syncope. He states that he just felt "a little" fatigued. Patient states that he's been taking his medications as prescribed.   Patient informed of driving restrictions x 6 months. Patient voiced understanding.  Will discuss episode with Chanetta Marshall, NP.  Will call patient if anything further is recommended.

## 2016-12-21 ENCOUNTER — Telehealth (HOSPITAL_COMMUNITY): Payer: Self-pay | Admitting: *Deleted

## 2016-12-21 MED ORDER — ALLOPURINOL 100 MG PO TABS: 200.0000 mg | ORAL_TABLET | Freq: Every day | ORAL | 3 refills | Status: DC

## 2016-12-21 NOTE — Telephone Encounter (Signed)
Pharmacy called requesting 90-day supply for Allopurinol, insurance request.  Refill sent to pharmacy.

## 2016-12-26 ENCOUNTER — Ambulatory Visit (INDEPENDENT_AMBULATORY_CARE_PROVIDER_SITE_OTHER): Payer: Medicare Other

## 2016-12-26 DIAGNOSIS — I5022 Chronic systolic (congestive) heart failure: Secondary | ICD-10-CM

## 2016-12-26 DIAGNOSIS — Z95810 Presence of automatic (implantable) cardiac defibrillator: Secondary | ICD-10-CM | POA: Diagnosis not present

## 2016-12-26 NOTE — Progress Notes (Signed)
EPIC Encounter for ICM Monitoring  Patient Name: Miguel Roach is a 76 y.o. male Date: 12/26/2016 Primary Care Physican: Horatio Pel, MD Primary [place] Electrophysiologist: Druscilla Brownie Weight:unknown      Heart Failure questions reviewed, pt asymptomatic.   Thoracic impedance normal.  Prescribed and confirmed dosage: Furosemide 40 mg 2 tablets (80 mg total) daily  Labs: 12/20/2016  Creatinine 3.17, BUN 4.1, Potassium 4.4, Sodium 139, EGFR 18-21 11/22/2016 Creatinine 2.96, BUN 39, Potassium 4.3, Sodium 142, EGFR 19-22  11/14/2016 Creatinine 3.05, BUN 43, Potassium 4.6, Sodium 141, EGFR 19-22  06/06/2016 Creatinine 2.53, BUN 44, Potassium 4.3, Sodium 140  05/01/2016 Creatinine 2.60, BUN 32, Potassium 4.0, Sodium 141, EGFR 23-26  04/26/2016 Creatinine 2.85, BUN 34, Potassium 4.0, Sodium 141, EGFR 20-24   Recommendations: No changes. Reminded to limit dietary salt intake to 2000 mg/day and fluid intake to < 2 liters/day. Encouraged to call for fluid symptoms.  Follow-up plan: ICM clinic phone appointment on 01/29/2017.  Copy of ICM check sent to primary cardiologist and device physician.   3 month ICM trend: 12/26/2016   1 Year ICM trend:      Rosalene Billings, RN 12/26/2016 5:01 PM

## 2017-01-04 ENCOUNTER — Other Ambulatory Visit: Payer: Self-pay | Admitting: Nurse Practitioner

## 2017-01-09 ENCOUNTER — Encounter: Payer: Self-pay | Admitting: Internal Medicine

## 2017-01-09 ENCOUNTER — Ambulatory Visit (INDEPENDENT_AMBULATORY_CARE_PROVIDER_SITE_OTHER): Payer: Medicare Other | Admitting: Internal Medicine

## 2017-01-09 VITALS — BP 108/64 | HR 70 | Ht 71.0 in | Wt 176.0 lb

## 2017-01-09 DIAGNOSIS — I472 Ventricular tachycardia, unspecified: Secondary | ICD-10-CM

## 2017-01-09 DIAGNOSIS — Z4502 Encounter for adjustment and management of automatic implantable cardiac defibrillator: Secondary | ICD-10-CM

## 2017-01-09 DIAGNOSIS — I5022 Chronic systolic (congestive) heart failure: Secondary | ICD-10-CM

## 2017-01-09 DIAGNOSIS — I420 Dilated cardiomyopathy: Secondary | ICD-10-CM

## 2017-01-09 NOTE — Progress Notes (Signed)
HPI Miguel Roach is returns today for ongoing followup of his ICD. He is a pleasant 76 yo man who has a h/o LV dysfunction dating back 6-7 years. His EF is 30%. He is on maximal medical therapy. Since his ICD was placed he has done well. He is traveling but does admit to dietary indiscretion. He has graduated from cardiac rehab and remains active. He was out working in his yard when he had a near syncopal episode and felt his ICD go off. He feels well otherwise. Allergies  Allergen Reactions  . Benicar [Olmesartan] Cough     Current Outpatient Prescriptions  Medication Sig Dispense Refill  . allopurinol (ZYLOPRIM) 100 MG tablet Take 2 tablets (200 mg total) by mouth daily. 180 tablet 3  . carvedilol (COREG) 12.5 MG tablet Take 1 tablet (12.5 mg total) by mouth 2 (two) times daily. 60 tablet 3  . colchicine 0.6 MG tablet Take 0.6 mg by mouth as needed (take 1 tablet every monday wednesday and friday).    Marland Kitchen digoxin (LANOXIN) 0.125 MG tablet Take 0.0625 mg by mouth every other day.    . furosemide (LASIX) 40 MG tablet Take 2 tablets (80 mg total) by mouth daily. 180 tablet 3  . hydrALAZINE (APRESOLINE) 25 MG tablet Take 1 tablet (25 mg total) by mouth 3 (three) times daily. 270 tablet 3  . isosorbide mononitrate (IMDUR) 30 MG 24 hr tablet Take 1 tablet (30 mg total) by mouth daily. 90 tablet 3  . spironolactone (ALDACTONE) 25 MG tablet TAKE 1 TABLET BY MOUTH EVERY DAY 30 tablet 3   No current facility-administered medications for this visit.      Past Medical History:  Diagnosis Date  . Adenomatous colon polyp 2011  . AICD (automatic cardioverter/defibrillator) present   . Anal fistula 2003  . Arthritis   . CHF (congestive heart failure) ([place])   . CRI (chronic renal insufficiency)    creatinine back to normal  . Diverticulosis   . ED (erectile dysfunction)   . Gout   . Headache   . Hypertension   . Internal hemorrhoids   . Liver lesion 2007  . Nonischemic cardiomyopathy  ([place])    EF 20-25% by echo 2016, At that time 40% RCA stenosis, distal 35% circumflex stenosis  . Renal cyst 2007   "I'm not familiar with that, but I follow up on my creatinine because my kidney function is low." 09/2016  . Sleep apnea    "had test; never followed thru w/getting mask" (11/25/2015)    ROS:   All systems reviewed and negative except as noted in the HPI.   Past Surgical History:  Procedure Laterality Date  . ANAL FISTULOTOMY  01/2002  . CARDIAC CATHETERIZATION N/A 04/16/2015   Procedure: Right/Left Heart Cath and Coronary Angiography;  Surgeon: Belva Crome, MD;  Location: [place] [facility];  Service: Cardiovascular;  Laterality: N/A;  . EP IMPLANTABLE DEVICE N/A 11/24/2015   Procedure: ICD Implant;  Surgeon: Evans Lance, MD;  Location: [place] [facility];  Service: Cardiovascular;  Laterality: N/A;  . ESOPHAGOGASTRODUODENOSCOPY (EGD) WITH PROPOFOL N/A 10/02/2016   Procedure: ESOPHAGOGASTRODUODENOSCOPY (EGD) WITH PROPOFOL;  Surgeon: Ladene Artist, MD;  [facility] ENDOSCOPY;  Service: Endoscopy;  Laterality: N/A;  . EUS N/A 10/19/2016   Procedure: UPPER ENDOSCOPIC ULTRASOUND (EUS) RADIAL;  Surgeon: Milus Banister, MD;  Location: [facility] ENDOSCOPY;  Service: Endoscopy;  Laterality: N/A;  . INSERTION OF ICD  11/24/2015  .  TONSILLECTOMY  ~ 1950     Family History  Problem Relation Age of Onset  . Heart failure Mother 90  . Sickle cell anemia Brother   . Breast cancer Sister   . Diabetes Sister   . Diabetes Father     died at age 75  . Diabetes Sister   . Colon polyps Brother   . Clotting disorder      two daughters (inherited from his first wife)     Social History   Social History  . Marital status: Married    Spouse name: Miguel Roach  . Number of children: 4  . Years of education: N/A   Occupational History  . Retired Environmental consultant   . Retired Animal nutritionist    Social History Main Topics  . Smoking status: Never Smoker  . Smokeless tobacco: Never Used       Comment: "smoked briefly in my freshman year in college; purchased 1  pack of cigarettes; didn't finish that"  . Alcohol use No     Comment: 11/25/2015 "used to drink a beer q now in then back in my 20s"  . Drug use: No  . Sexual activity: Not Currently   Other Topics Concern  . Not on file   Social History Narrative  . No narrative on file     Ht 5\' 11"  (1.803 m)   Wt 176 lb (79.8 kg)   BMI 24.55 kg/m   Physical Exam Stable appearing 76 yo man, NAD, looking younger than his stated age 52: Unremarkable Neck:  6 cm JVD, no thyromegally Back:  No CVA tenderness Lungs:  Clear with no wheezes HEART:  Regular rate rhythm, no murmurs, no rubs, no clicks Abd:  soft, positive bowel sounds, no organomegally, no rebound, no guarding Ext:  2 plus pulses, no edema, no cyanosis, no clubbing Skin:  No rashes no nodules Neuro:  CN II through XII intact, motor grossly intact  ICD interogation - normal Medtronic ICD function  Assess/Plan: 1. Chronic systolic heart failure - his symptoms are class 2. He will continue his current meds and activity. He is encouraged to maintain a low sodium diet. 2. PAF - his device suggests he may have had up to 10 minutes of atrial fib. I will follow this. If we see atrial fib for over an hour, would consider systemic anti-coagulation. 3. ICD - his Medtronic device is working normally. Will recheck in several months. 4. VT - he had sustained very fast monomorphic VT at almost 300/min. His device successfully defibrillated him back to NSR. No indication for AA drug therapy at this time.   Miguel Roach.D.

## 2017-01-09 NOTE — Patient Instructions (Signed)
Medication Instructions:    Your physician recommends that you continue on your current medications as directed. Please refer to the Current Medication list given to you today.  --- If you need a refill on your cardiac medications before your next appointment, please call your pharmacy. ---  Labwork:  None ordered  Testing/Procedures:  None ordered  Follow-Up: Remote monitoring is used to monitor your Pacemaker of ICD from home. This monitoring reduces the number of office visits required to check your device to one time per year. It allows [place] to keep an eye on the functioning of your device to ensure it is working properly. You are scheduled for a device check from home on 04/10/17. You may send your transmission at any time that day. If you have a wireless device, the transmission will be sent automatically. After your physician reviews your transmission, you will receive a postcard with your next transmission date.   Your physician wants you to follow-up in: 1 year with Dr. Lovena Le.  You will receive a reminder letter in the mail two months in advance. If you don't receive a letter, please call our office to schedule the follow-up appointment.  Thank you for choosing [facility]!!

## 2017-01-11 LAB — CUP PACEART INCLINIC DEVICE CHECK
Date Time Interrogation Session: 20180329155803
HighPow Impedance: 60 Ohm
Implantable Lead Implant Date: 20170208
Implantable Lead Model: 6935
Lead Channel Pacing Threshold Pulse Width: 0.4 ms
Lead Channel Sensing Intrinsic Amplitude: 8 mV
MDC IDC LEAD LOCATION: 753860
MDC IDC MSMT LEADCHNL RV IMPEDANCE VALUE: 399 Ohm
MDC IDC MSMT LEADCHNL RV PACING THRESHOLD AMPLITUDE: 1 V
MDC IDC PG IMPLANT DT: 20170208
MDC IDC STAT BRADY RV PERCENT PACED: 0.1 % — AB

## 2017-01-21 ENCOUNTER — Other Ambulatory Visit (HOSPITAL_COMMUNITY): Payer: Self-pay | Admitting: Cardiology

## 2017-01-22 ENCOUNTER — Other Ambulatory Visit (HOSPITAL_COMMUNITY): Payer: Self-pay | Admitting: Cardiology

## 2017-01-29 ENCOUNTER — Ambulatory Visit (INDEPENDENT_AMBULATORY_CARE_PROVIDER_SITE_OTHER): Payer: Medicare Other

## 2017-01-29 ENCOUNTER — Telehealth: Payer: Self-pay

## 2017-01-29 DIAGNOSIS — I5022 Chronic systolic (congestive) heart failure: Secondary | ICD-10-CM

## 2017-01-29 DIAGNOSIS — Z4502 Encounter for adjustment and management of automatic implantable cardiac defibrillator: Secondary | ICD-10-CM

## 2017-01-29 DIAGNOSIS — Z95810 Presence of automatic (implantable) cardiac defibrillator: Secondary | ICD-10-CM

## 2017-01-29 NOTE — Telephone Encounter (Signed)
Remote ICM transmission received.  Attempted patient call and left detailed message regarding transmission and next ICM scheduled for 03/01/2017.  Advised to return call for any fluid symptoms or questions.

## 2017-01-29 NOTE — Progress Notes (Signed)
EPIC Encounter for ICM Monitoring  Patient Name: Miguel Roach is a 76 y.o. male Date: 01/29/2017 Primary Care Physican: Horatio Pel, MD Primary Cardiologist:[place] Electrophysiologist: Druscilla Brownie Weight:unknown        Since 09-Jan-2017 Time in AF 0.0 hr/day (0.0%)       Attempted call to patient and unable to reach.  Left detailed message regarding transmission.  Transmission reviewed.    Thoracic impedance normal  Prescribed and confirmed dosage: Furosemide 40 mg 2 tablets (80 mg total) daily  Labs: 12/14/2016 Creatinine 3.17, BUN 4.1, Potassium 4.4, Sodium 139, EGFR 18-21 11/22/2016 Creatinine 2.96, BUN 39, Potassium 4.3, Sodium 142, EGFR 19-22  11/14/2016 Creatinine 3.05, BUN 43, Potassium 4.6, Sodium 141, EGFR 19-22  06/06/2016 Creatinine 2.53, BUN 44, Potassium 4.3, Sodium 140  05/01/2016 Creatinine 2.60, BUN 32, Potassium 4.0, Sodium 141, EGFR 23-26  04/26/2016 Creatinine 2.85, BUN 34, Potassium 4.0, Sodium 141, EGFR 20-24   Recommendations: NONE - Unable to reach patient   Follow-up plan: ICM clinic phone appointment on 03/01/2017.    Copy of ICM check sent to [place].   3 month ICM trend: 01/29/2017   1 Year ICM trend:  *    Rosalene Billings, RN 01/29/2017 8:15 AM

## 2017-02-12 ENCOUNTER — Ambulatory Visit (HOSPITAL_COMMUNITY)
Admission: RE | Admit: 2017-02-12 | Discharge: 2017-02-12 | Disposition: A | Discharge status: Home / Self Care | Payer: Medicare Other | Source: Ambulatory Visit | Attending: Cardiology | Admitting: Cardiology

## 2017-02-12 ENCOUNTER — Encounter (HOSPITAL_COMMUNITY): Payer: Self-pay

## 2017-02-12 VITALS — BP 110/62 | HR 65 | Wt 172.5 lb

## 2017-02-12 DIAGNOSIS — Z8249 Family history of ischemic heart disease and other diseases of the circulatory system: Secondary | ICD-10-CM | POA: Diagnosis not present

## 2017-02-12 DIAGNOSIS — I472 Ventricular tachycardia, unspecified: Secondary | ICD-10-CM

## 2017-02-12 DIAGNOSIS — I429 Cardiomyopathy, unspecified: Secondary | ICD-10-CM | POA: Insufficient documentation

## 2017-02-12 DIAGNOSIS — Z7982 Long term (current) use of aspirin: Secondary | ICD-10-CM | POA: Diagnosis not present

## 2017-02-12 DIAGNOSIS — N183 Chronic kidney disease, stage 3 unspecified: Secondary | ICD-10-CM

## 2017-02-12 DIAGNOSIS — I2510 Atherosclerotic heart disease of native coronary artery without angina pectoris: Secondary | ICD-10-CM | POA: Insufficient documentation

## 2017-02-12 DIAGNOSIS — I480 Paroxysmal atrial fibrillation: Secondary | ICD-10-CM | POA: Diagnosis not present

## 2017-02-12 DIAGNOSIS — Z79899 Other long term (current) drug therapy: Secondary | ICD-10-CM | POA: Diagnosis not present

## 2017-02-12 DIAGNOSIS — J984 Other disorders of lung: Secondary | ICD-10-CM

## 2017-02-12 DIAGNOSIS — G4733 Obstructive sleep apnea (adult) (pediatric): Secondary | ICD-10-CM | POA: Diagnosis not present

## 2017-02-12 DIAGNOSIS — I5022 Chronic systolic (congestive) heart failure: Secondary | ICD-10-CM | POA: Diagnosis present

## 2017-02-12 DIAGNOSIS — M109 Gout, unspecified: Secondary | ICD-10-CM | POA: Insufficient documentation

## 2017-02-12 LAB — BASIC METABOLIC PANEL
ANION GAP: 6 (ref 5–15)
BUN: 55 mg/dL — AB (ref 6–20)
CHLORIDE: 109 mmol/L (ref 101–111)
CO2: 24 mmol/L (ref 22–32)
Calcium: 9.4 mg/dL (ref 8.9–10.3)
Creatinine, Ser: 3.39 mg/dL — ABNORMAL HIGH (ref 0.61–1.24)
GFR calc Af Amer: 19 mL/min — ABNORMAL LOW (ref >60)
GFR, EST NON AFRICAN AMERICAN: 16 mL/min — AB (ref >60)
Glucose, Bld: 109 mg/dL — ABNORMAL HIGH (ref 65–99)
POTASSIUM: 4.3 mmol/L (ref 3.5–5.1)
SODIUM: 139 mmol/L (ref 135–145)

## 2017-02-12 LAB — DIGOXIN LEVEL: DIGOXIN LVL: 0.6 ng/mL — AB (ref 0.8–2.0)

## 2017-02-12 MED ORDER — ASPIRIN EC 81 MG PO TBEC: 81.0000 mg | DELAYED_RELEASE_TABLET | Freq: Every day | ORAL | 3 refills | Status: DC

## 2017-02-12 MED ORDER — CARVEDILOL 12.5 MG PO TABS: 18.7500 mg | ORAL_TABLET | Freq: Two times a day (BID) | ORAL | 3 refills | Status: DC

## 2017-02-12 NOTE — Patient Instructions (Signed)
Labs today (will call for abnormal results, otherwise no news is good news)  START taking Aspirin 81 mg (1 Tablet) once Daily  INCREASE Coreg to 12.5 mg (1 Tablet) in the AM and 18.75 mg (1.5 Tablets) in the PM for 2 DAYS.  Then continue taking 18.75 mg (1.5 Tablets) Two times Daily.   Follow up in 3 Months

## 2017-02-13 NOTE — Progress Notes (Signed)
P  Advanced Heart Failure Clinic Note   Patient ID: Miguel Roach, male   DOB: 1941/02/07, 76 y.o.   MRN: [id number] PCP: Dr. Shelia Media Cardiology: Dr. Aundra Dubin   76 yo with history of chronic systolic CHF/nonischemic cardiomyopathy and CKD presents for CHF clinic evaluation. He actually had an echo back in 2010 with EF 40-45% at the time.  He says that this really was not followed up upon by a cardiologist and that he did well for a number of years after that. In 6/16, he was admitted with acute systolic CHF.  Echo showed EF down to 20-25%.  Cardiac cath showed nonobstructive CAD.  His Lasix has been gradually increased.  This has brought his weight down and improved his breathing, but creatinine has risen. He had been on olmesartan but this was stopped because he thought it was causing cough.  He was then put on losartan, but this was stopped with rise in creatinine. Also intolerant to Bidil with orthostatic symptoms.  He was started on digoxin.  Echo in 12/16 showed persistently low EF, 15-20%.  Medtronic ICD was placed in 2/17.  Repeat echo 1/18 showed EF 25% with diffuse hypokinesis.   In 3/18, he had an episode of about 10 minutes atrial fibrillation noted on his ICD.  He also had 1 episode of VT terminated by an appropriate discharge.  He had push-mowed his grass and was very fatigued when he had the shock.  Since then, he has had no further arrhythmias (device interrogated today).   He still feels well.  BP running a bit higher, SBP 110-115 range recently.  No lightheadedness/syncope.  He is exercising 4 days/week at the [facility] in [place].  He walks and does light weights.  No exertional dyspnea, orthopnea, PND, or chest pain.  No palpitations.  He has a nephrologist that he sees regularly.    Optivol: fluid index < threshold, impedance stable, about 2-3 hrs/day activity, no further VT or atrial fibrillation since last interrogation with Dr. Lovena Le.   Labs (6/16): BNP 1954, TSH normal Labs (7/16):  K 4.1, creatinine 1.72 Labs (8/16): creatinine 2.3 Labs (9/16): digoxin 0.3, K 4, creatinine 2.14, SPEP negative, UPEP negative Labs (10/16): K 4.6, creatinine 1.62 Labs (11/16): K 4.4, creatinine 2.33, digoxin 0.5 Labs (12/16): creatinine 2.1 (nephrology) Labs (1/17): digoxin 0.6, uric acid 10.4 Labs (2/17): K 4, creatinine 2.13, hgb 11.6 Labs (5/17): K 3.8, creatinine 2.22, digoxin 0.7 Labs (9/17): K 4.4, creatinine 2.88, LFTs normal, digoxin 1.0, hgb 11.8, LDL 88, HDL 50 Labs (1/18): digoxin 0.5 Labs (3/18): K 4.4, creatinine 3.17  PMH: 1. CKD: Stage III.  Follows with Dr Mercy Nepomuceno. 2. Gout 3. Chronic systolic CHF: Nonischemic cardiomyopathy.  EF 40-45% in 2010.  Echo (6/16) with EF 20-25%, severe LV dilation, mild MR, mild AI, moderate LAE.  LHC/RHC (6/16) with nonobstructive CAD; mean RA 2, RV 48/1, PA 47/18, mean PCWP 12, CI 3.2.  CPX (10/16) with peak VO2 13.1 (50% predicted), VE/VCO2 slope 39.3, RER 1.22 => moderate to severe functional limitation.  Echo (12/16) with EF 15-20%, severe LV dilation, diffuse hypokinesis, moderate AI, mild MR, normal RV size and systolic function. Medtronic ICD.  - CPX (11/17): Submaximal with RER 0.95, VE/VCO2 slope 27, peak VO2 18.2.  Suspect no more than mild HF limitation.  - Echo (1/18): EF 25%, diffuse hypokinesis, normal RV size and systolic function, mild AI.  4. ACEI cough.  5. Pleural effusion: Thoracentesis on right in 8/16.  It was a transudate,  likely due to CHF.  6. OSA: To start Bipap.  7. Esophageal duplication cyst.  8. High resolution CT chest 12/17: No evidence for interstitial lung disease.  9. Atrial fibrillation: Paroxysmal, 1 10 minute episode in 3/18.  10. VT: ICD discharge 3/18.   SH: Married, retired Pharmacist, hospital, never smoked, no ETOH.   FH: Mother with "weak heart." Brother with sickle cell anemia.   ROS: All systems reviewed and negative except as per HPI.   Current Outpatient Prescriptions  Medication Sig Dispense Refill   . allopurinol (ZYLOPRIM) 100 MG tablet Take 2 tablets (200 mg total) by mouth daily. 180 tablet 3  . carvedilol (COREG) 12.5 MG tablet Take 1.5 tablets (18.75 mg total) by mouth 2 (two) times daily. 90 tablet 3  . colchicine 0.6 MG tablet Take 0.6 mg by mouth as needed (take 1 tablet every monday wednesday and friday).    Marland Kitchen digoxin (LANOXIN) 0.125 MG tablet Take 0.0625 mg by mouth every other day.    . furosemide (LASIX) 40 MG tablet Take 2 tablets (80 mg total) by mouth daily. 180 tablet 3  . hydrALAZINE (APRESOLINE) 25 MG tablet Take 1 tablet (25 mg total) by mouth 3 (three) times daily. 270 tablet 3  . isosorbide mononitrate (IMDUR) 30 MG 24 hr tablet Take 1 tablet (30 mg total) by mouth daily. 90 tablet 3  . spironolactone (ALDACTONE) 25 MG tablet TAKE 1 TABLET BY MOUTH EVERY DAY 30 tablet 3  . aspirin EC 81 MG tablet Take 1 tablet (81 mg total) by mouth daily. 30 tablet 3   No current facility-administered medications for this encounter.    BP 110/62   Pulse 65   Wt 172 lb 8 oz (78.2 kg)   SpO2 100%   BMI 24.06 kg/m   General: NAD Neck: JVP not elevated, no thyromegaly or lymphadenopathy.  Lungs: Clear to auscultation bilaterally. CV: Nondisplaced PMI.  Heart regular S1/S2 with wide S2 split, no S3/S4, no murmur.  No edema.  No carotid bruit.  Normal pedal pulses.  Abdomen: Soft, NT, ND, no HSM. +BS Skin: Intact without lesions or rashes.  Neurologic: Alert and oriented x 3.  Psych: Normal affect. Extremities: No clubbing or cyanosis.   Assessment/Plan: 1. Chronic systolic CHF: Nonischemic cardiomyopathy, NYHA class II symptoms.  CO preserved on RHC in 6/16, but 10/16 CPX showed moderate to severe functional impairment (seems worse than his symptoms would suggest).  However, repeat CPX in 11/17, though submaximal, suggested no more than mild HF limitation. No significant CAD with coronary angiography in 6/16.  No hx heavy ETOH or drug abuse. No marked HTN.  Negative SPEP/UPEP.   Possible prior myocarditis.  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  He now has Medtronic ICD.  Last echo in 1/18 with EF 25% with normal RV size and systolic function.  On exam, he looks euvolemic, NYHA class II symptoms (stable).  - Continue Lasix 80 mg daily.  BMET today.  - Continue spironolactone 25 mg daily.  Follow K closely with elevated creatinine.   - Increase Coreg to 12.5 mg qam/18.75 mg qpm for 3 days, then increase to 18.75 mg bid.  - Hold off on ACEI/ARB/Entresto at this time with elevated creatinine.  - Continue hydralazine 25 mg tid and Imdur 30 mg daily.   - Continue digoxin 0.0625 every other day, check level today.  Will need to follow level carefully with elevated creatinine.  2. CKD: Stage III.  Creatinine has been  slowly up-trending.  BMET today.  He sees nephrology.  3. OSA: Bipap. 4. Gout: Gout controlled on allopurinol. 5. VT: Appropriate ICD discharge in 3/18, no recurrence.  - Increase Coreg - Hold off on anti-arrhythmic unless VT recurs.  6. Atrial fibrillation: Paroxysmal, one episode lasting about 10 minutes noted.   - Can continue ASA 81.  - No recurrence since the above mentioned episode.  Continue to follow by ICD interrogation.  If he has > 1 hour atrial fibrillation, he will need to start anticoagulation.   Followup in 3 months.   [name] 02/13/2017

## 2017-03-01 ENCOUNTER — Ambulatory Visit (INDEPENDENT_AMBULATORY_CARE_PROVIDER_SITE_OTHER): Payer: Medicare Other

## 2017-03-01 DIAGNOSIS — I5022 Chronic systolic (congestive) heart failure: Secondary | ICD-10-CM | POA: Diagnosis not present

## 2017-03-01 DIAGNOSIS — Z95810 Presence of automatic (implantable) cardiac defibrillator: Secondary | ICD-10-CM

## 2017-03-01 NOTE — Progress Notes (Signed)
EPIC Encounter for ICM Monitoring  Patient Name: Miguel Roach is a 76 y.o. male Date: 03/01/2017 Primary Care Physican: Deland Pretty, MD Primary [place] Electrophysiologist: Druscilla Brownie Weight:unknown      Heart Failure questions reviewed, pt asymptomatic.   Thoracic impedance normal.  Prescribed and confirmed dosage: Furosemide 40 mg 2 tablets (80 mg total) daily  Labs: 12/14/2016 Creatinine 3.17, BUN 4.1, Potassium 4.4, Sodium 139, EGFR 18-21 11/22/2016 Creatinine 2.96, BUN 39, Potassium 4.3, Sodium 142, EGFR 19-22  11/14/2016 Creatinine 3.05, BUN 43, Potassium 4.6, Sodium 141, EGFR 19-22  08/22/2017Creatinine 2.53, BUN 44, Potassium 4.3, Sodium 140  05/01/2016 Creatinine 2.60, BUN 32, Potassium 4.0, Sodium 141, EGFR 23-26  04/26/2016 Creatinine 2.85, BUN 34, Potassium 4.0, Sodium 141, EGFR 20-24   Recommendations: No changes. Discussed to limit salt intake to 2000 mg/day and fluid intake to < 2 liters/day.  Encouraged to call for fluid symptoms or use local ER for any urgent symptoms.  Follow-up plan: ICM clinic phone appointment on 04/10/2017.   Copy of ICM check sent to device physician.   3 month ICM trend: 03/01/2017   1 Year ICM trend:      Rosalene Billings, RN 03/01/2017 11:47 AM

## 2017-03-13 ENCOUNTER — Other Ambulatory Visit: Payer: Self-pay | Admitting: Internal Medicine

## 2017-03-21 ENCOUNTER — Other Ambulatory Visit (HOSPITAL_COMMUNITY): Payer: Self-pay | Admitting: Cardiology

## 2017-04-09 ENCOUNTER — Telehealth (HOSPITAL_COMMUNITY): Payer: Self-pay

## 2017-04-09 NOTE — Telephone Encounter (Signed)
Patient calling CHF clinic triage line to report recently prescribed Uloric and scared to take it as he is worried about side effects, how it will react with current meds, and how safe it is to take with comorbidities of CHF and CKD. Will forward to CHF clinical PharmD Ileene Patrick to see if she can speak to patient to further educate.  Renee Pain, RN

## 2017-04-10 ENCOUNTER — Telehealth (HOSPITAL_COMMUNITY): Payer: Self-pay | Admitting: Pharmacist

## 2017-04-10 ENCOUNTER — Telehealth: Payer: Self-pay

## 2017-04-10 ENCOUNTER — Ambulatory Visit (INDEPENDENT_AMBULATORY_CARE_PROVIDER_SITE_OTHER): Payer: Medicare Other | Admitting: *Deleted

## 2017-04-10 DIAGNOSIS — Z95810 Presence of automatic (implantable) cardiac defibrillator: Secondary | ICD-10-CM | POA: Diagnosis not present

## 2017-04-10 DIAGNOSIS — I5022 Chronic systolic (congestive) heart failure: Secondary | ICD-10-CM

## 2017-04-10 DIAGNOSIS — I472 Ventricular tachycardia, unspecified: Secondary | ICD-10-CM

## 2017-04-10 DIAGNOSIS — I428 Other cardiomyopathies: Secondary | ICD-10-CM

## 2017-04-10 NOTE — Progress Notes (Signed)
Remote ICD transmission.   

## 2017-04-10 NOTE — Telephone Encounter (Signed)
Patient called concerned about drug-drug interactions with Uloric and his other medications. Uloric was prescribed by his rheumatologist and he was given samples. Patient states that he has not started taking Uloric given his concern. He reports that his most recent gout flare is attributable to dietary indiscretion.   Advised patient to continue taking allopurinol and to not start Uloric as the FDA issued a warning on 08/30/2016 that a safety clinical trial showed an increased risk of heart-related death in patients taking Uloric compared to those taking allopurinol.   Counseled patient on dietary precipitants of gout and advised to avoid. Patient verbalized understanding and expressed appreciation.   Carlean Jews, Pharm.D. PGY1 Pharmacy Resident 6/26/20189:39 AM Pager [phone]

## 2017-04-10 NOTE — Telephone Encounter (Signed)
Remote ICM transmission received.  Attempted patient call and left detailed message regarding transmission and next ICM scheduled for 05/11/2017.  Advised to return call for any fluid symptoms or questions.

## 2017-04-10 NOTE — Progress Notes (Signed)
EPIC Encounter for ICM Monitoring  Patient Name: Miguel Roach is a 76 y.o. male Date: 04/10/2017 Primary Care Physican: Deland Pretty, MD Primary [place] Electrophysiologist: Druscilla Brownie Weight:unknown                                         Attempted call to patient and unable to reach.  Left detailed message regarding transmission.  Transmission reviewed.    Thoracic impedance normal.  Prescribed and confirmed dosage: Furosemide 40 mg 2 tablets (80 mg total) daily  Labs: 02/12/2017 Creatinine 3.39, BUN 55, Potassium 4.3, Sodium 139, EGFR 16-19 12/14/2016 Creatinine 3.17, BUN 4.1,Potassium 4.4, Sodium 139, EGFR 18-21 11/22/2016 Creatinine 2.96, BUN 39, Potassium 4.3, Sodium 142, EGFR 19-22  11/14/2016 Creatinine 3.05, BUN 43, Potassium 4.6, Sodium 141, EGFR 19-22  08/22/2017Creatinine 2.53, BUN 44, Potassium 4.3, Sodium 140  05/01/2016 Creatinine 2.60, BUN 32, Potassium 4.0, Sodium 141, EGFR 23-26  04/26/2016 Creatinine 2.85, BUN 34, Potassium 4.0, Sodium 141, EGFR 20-24   Recommendations: Left voice mail with ICM number and encouraged to call for fluid symptoms.  Follow-up plan: ICM clinic phone appointment on 05/11/2017.  Office appointment scheduled 05/15/2017 with Dr. Aundra Dubin.  Copy of ICM check sent to device physician.   3 month ICM trend: 04/10/2017   1 Year ICM trend:      Rosalene Billings, RN 04/10/2017 4:08 PM

## 2017-04-11 ENCOUNTER — Encounter: Payer: Self-pay | Admitting: Cardiology

## 2017-04-11 LAB — CUP PACEART REMOTE DEVICE CHECK
Brady Statistic RV Percent Paced: 0.02 %
Date Time Interrogation Session: 20180626052303
HighPow Impedance: 70 Ohm
Implantable Lead Location: 753860
Lead Channel Impedance Value: 361 Ohm
Lead Channel Impedance Value: 456 Ohm
Lead Channel Sensing Intrinsic Amplitude: 10.5 mV
Lead Channel Sensing Intrinsic Amplitude: 10.5 mV
MDC IDC LEAD IMPLANT DT: 20170208
MDC IDC MSMT BATTERY REMAINING LONGEVITY: 130 mo
MDC IDC MSMT BATTERY VOLTAGE: 3.03 V
MDC IDC MSMT LEADCHNL RV PACING THRESHOLD AMPLITUDE: 0.75 V
MDC IDC MSMT LEADCHNL RV PACING THRESHOLD PULSEWIDTH: 0.4 ms
MDC IDC PG IMPLANT DT: 20170208
MDC IDC SET LEADCHNL RV PACING AMPLITUDE: 2 V
MDC IDC SET LEADCHNL RV PACING PULSEWIDTH: 0.4 ms
MDC IDC SET LEADCHNL RV SENSING SENSITIVITY: 0.3 mV

## 2017-04-13 ENCOUNTER — Other Ambulatory Visit (HOSPITAL_COMMUNITY): Payer: Self-pay | Admitting: Cardiology

## 2017-04-13 MED ORDER — DIGOXIN 125 MCG PO TABS: 0.0625 mg | ORAL_TABLET | ORAL | 3 refills | Status: DC

## 2017-04-23 ENCOUNTER — Other Ambulatory Visit (HOSPITAL_COMMUNITY): Payer: Self-pay | Admitting: Cardiology

## 2017-04-23 MED ORDER — CARVEDILOL 12.5 MG PO TABS: 18.7500 mg | ORAL_TABLET | Freq: Two times a day (BID) | ORAL | 3 refills | Status: DC

## 2017-05-11 ENCOUNTER — Telehealth: Payer: Self-pay

## 2017-05-11 ENCOUNTER — Ambulatory Visit (INDEPENDENT_AMBULATORY_CARE_PROVIDER_SITE_OTHER): Payer: Medicare Other

## 2017-05-11 DIAGNOSIS — Z95810 Presence of automatic (implantable) cardiac defibrillator: Secondary | ICD-10-CM | POA: Diagnosis not present

## 2017-05-11 DIAGNOSIS — I5022 Chronic systolic (congestive) heart failure: Secondary | ICD-10-CM | POA: Diagnosis not present

## 2017-05-11 NOTE — Telephone Encounter (Signed)
Remote ICM transmission received.  Attempted patient call and left detailed message regarding transmission and next ICM scheduled for 06/11/2017.  Advised to return call for any fluid symptoms or questions.

## 2017-05-11 NOTE — Progress Notes (Signed)
EPIC Encounter for ICM Monitoring  Patient Name: Miguel Roach is a 76 y.o. male Date: 05/11/2017 Primary Care Physican: Deland Pretty, MD Primary [place] Electrophysiologist: Druscilla Brownie Weight:unknown      Attempted call to patient and unable to reach.  Left detailed message regarding transmission.  Transmission reviewed.    Thoracic impedance normal.  Prescribed dosage: Furosemide 40 mg 2 tablets (80 mg total) daily  Labs: 02/12/2017 Creatinine 3.39, BUN 55, Potassium 4.3, Sodium 139, EGFR 16-19 12/14/2016 Creatinine 3.17, BUN 4.1,Potassium 4.4, Sodium 139, EGFR 18-21 11/22/2016 Creatinine 2.96, BUN 39, Potassium 4.3, Sodium 142, EGFR 19-22  11/14/2016 Creatinine 3.05, BUN 43, Potassium 4.6, Sodium 141, EGFR 19-22  08/22/2017Creatinine 2.53, BUN 44, Potassium 4.3, Sodium 140  05/01/2016 Creatinine 2.60, BUN 32, Potassium 4.0, Sodium 141, EGFR 23-26  04/26/2016 Creatinine 2.85, BUN 34, Potassium 4.0, Sodium 141, EGFR 20-24  Recommendations: Left voice mail with ICM number and encouraged to call for fluid symptoms.  Follow-up plan: ICM clinic phone appointment on 06/11/2017.  Office appointment scheduled 05/15/2017 with Dr. Aundra Dubin.  Copy of ICM check sent to device physician.   3 month ICM trend: 05/11/2017   1 Year ICM trend:      Rosalene Billings, RN 05/11/2017 9:13 AM

## 2017-05-15 ENCOUNTER — Encounter (HOSPITAL_COMMUNITY): Payer: Self-pay | Admitting: Cardiology

## 2017-05-15 ENCOUNTER — Ambulatory Visit (HOSPITAL_COMMUNITY)
Admission: RE | Admit: 2017-05-15 | Discharge: 2017-05-15 | Disposition: A | Discharge status: Home / Self Care | Payer: Medicare Other | Source: Ambulatory Visit | Attending: Cardiology | Admitting: Cardiology

## 2017-05-15 VITALS — BP 112/62 | HR 67 | Wt 165.0 lb

## 2017-05-15 DIAGNOSIS — M109 Gout, unspecified: Secondary | ICD-10-CM | POA: Diagnosis not present

## 2017-05-15 DIAGNOSIS — G4733 Obstructive sleep apnea (adult) (pediatric): Secondary | ICD-10-CM | POA: Insufficient documentation

## 2017-05-15 DIAGNOSIS — N183 Chronic kidney disease, stage 3 unspecified: Secondary | ICD-10-CM

## 2017-05-15 DIAGNOSIS — Z79899 Other long term (current) drug therapy: Secondary | ICD-10-CM | POA: Diagnosis not present

## 2017-05-15 DIAGNOSIS — I428 Other cardiomyopathies: Secondary | ICD-10-CM | POA: Insufficient documentation

## 2017-05-15 DIAGNOSIS — Z7982 Long term (current) use of aspirin: Secondary | ICD-10-CM | POA: Diagnosis not present

## 2017-05-15 DIAGNOSIS — I480 Paroxysmal atrial fibrillation: Secondary | ICD-10-CM

## 2017-05-15 DIAGNOSIS — I5022 Chronic systolic (congestive) heart failure: Secondary | ICD-10-CM

## 2017-05-15 DIAGNOSIS — I472 Ventricular tachycardia: Secondary | ICD-10-CM | POA: Insufficient documentation

## 2017-05-15 LAB — BASIC METABOLIC PANEL
ANION GAP: 10 (ref 5–15)
BUN: 42 mg/dL — ABNORMAL HIGH (ref 6–20)
CALCIUM: 9.2 mg/dL (ref 8.9–10.3)
CHLORIDE: 103 mmol/L (ref 101–111)
CO2: 24 mmol/L (ref 22–32)
Creatinine, Ser: 3.12 mg/dL — ABNORMAL HIGH (ref 0.61–1.24)
GFR calc non Af Amer: 18 mL/min — ABNORMAL LOW (ref >60)
GFR, EST AFRICAN AMERICAN: 21 mL/min — AB (ref >60)
Glucose, Bld: 91 mg/dL (ref 65–99)
POTASSIUM: 4.2 mmol/L (ref 3.5–5.1)
Sodium: 137 mmol/L (ref 135–145)

## 2017-05-15 LAB — DIGOXIN LEVEL: Digoxin Level: 0.4 ng/mL — ABNORMAL LOW (ref 0.8–2.0)

## 2017-05-15 MED ORDER — HYDRALAZINE HCL 25 MG PO TABS: 37.5000 mg | ORAL_TABLET | Freq: Three times a day (TID) | ORAL | 3 refills | Status: DC

## 2017-05-15 NOTE — Progress Notes (Signed)
P  Advanced Heart Failure Clinic Note   Patient ID: Miguel Roach, male   DOB: 03-24-1941, 76 y.o.   MRN: [id number] PCP: Dr. Shelia Media Cardiology: Dr. Aundra Dubin   76 yo with history of chronic systolic CHF/nonischemic cardiomyopathy and CKD presents for CHF clinic evaluation. He actually had an echo back in 2010 with EF 40-45% at the time.  He says that this really was not followed up upon by a cardiologist and that he did well for a number of years after that. In 6/16, he was admitted with acute systolic CHF.  Echo showed EF down to 20-25%.  Cardiac cath showed nonobstructive CAD.  His Lasix has been gradually increased.  This has brought his weight down and improved his breathing, but creatinine has risen. He had been on olmesartan but this was stopped because he thought it was causing cough.  He was then put on losartan, but this was stopped with rise in creatinine. Also intolerant to Bidil with orthostatic symptoms.  He was started on digoxin.  Echo in 12/16 showed persistently low EF, 15-20%.  Medtronic ICD was placed in 2/17.  Repeat echo 1/18 showed EF 25% with diffuse hypokinesis.   In 3/18, he had an episode of about 10 minutes atrial fibrillation noted on his ICD.  He also had 1 episode of VT terminated by an appropriate discharge.  He had push-mowed his grass and was very fatigued when he had the shock.  Since then, he has had no further arrhythmias (device interrogated today).   He is feeling well.  Stronger than in the past.  Rarely gets lightheaded with fast standing.  Follows regularly with nephrology.  He does not get short of breath with his regular activities.  He exercises regularly.  No chest pain.  No orthopnea/PND.  Weight is down 7 lbs.     Optivol reviewed today: fluid index < threshold, impedance stable.  No VT.  1 episode of < 10 minutes atrial fibrillation.   Labs (6/16): BNP 1954, TSH normal Labs (7/16): K 4.1, creatinine 1.72 Labs (8/16): creatinine 2.3 Labs (9/16): digoxin  0.3, K 4, creatinine 2.14, SPEP negative, UPEP negative Labs (10/16): K 4.6, creatinine 1.62 Labs (11/16): K 4.4, creatinine 2.33, digoxin 0.5 Labs (12/16): creatinine 2.1 (nephrology) Labs (1/17): digoxin 0.6, uric acid 10.4 Labs (2/17): K 4, creatinine 2.13, hgb 11.6 Labs (5/17): K 3.8, creatinine 2.22, digoxin 0.7 Labs (9/17): K 4.4, creatinine 2.88, LFTs normal, digoxin 1.0, hgb 11.8, LDL 88, HDL 50 Labs (1/18): digoxin 0.5 Labs (3/18): K 4.4, creatinine 3.17 Labs (4/18): K 4.3, creatinine 3.39, digoxin 0.6  PMH: 1. CKD: Stage III.  Follows with Dr Mercy Mogensen. 2. Gout 3. Chronic systolic CHF: Nonischemic cardiomyopathy.  EF 40-45% in 2010.  Echo (6/16) with EF 20-25%, severe LV dilation, mild MR, mild AI, moderate LAE.  LHC/RHC (6/16) with nonobstructive CAD; mean RA 2, RV 48/1, PA 47/18, mean PCWP 12, CI 3.2.  CPX (10/16) with peak VO2 13.1 (50% predicted), VE/VCO2 slope 39.3, RER 1.22 => moderate to severe functional limitation.  Echo (12/16) with EF 15-20%, severe LV dilation, diffuse hypokinesis, moderate AI, mild MR, normal RV size and systolic function. Medtronic ICD.  - CPX (11/17): Submaximal with RER 0.95, VE/VCO2 slope 27, peak VO2 18.2.  Suspect no more than mild HF limitation.  - Echo (1/18): EF 25%, diffuse hypokinesis, normal RV size and systolic function, mild AI.  4. ACEI cough.  5. Pleural effusion: Thoracentesis on right in 8/16.  It was  a transudate, likely due to CHF.  6. OSA: To start Bipap.  7. Esophageal duplication cyst.  8. High resolution CT chest 12/17: No evidence for interstitial lung disease.  9. Atrial fibrillation: Paroxysmal, 1 10 minute episode in 3/18.  10. VT: ICD discharge 3/18.   SH: Married, retired Pharmacist, hospital, never smoked, no ETOH.   FH: Mother with "weak heart." Brother with sickle cell anemia.   ROS: All systems reviewed and negative except as per HPI.   Current Outpatient Prescriptions  Medication Sig Dispense Refill  . allopurinol  (ZYLOPRIM) 100 MG tablet Take 2 tablets (200 mg total) by mouth daily. 180 tablet 3  . aspirin EC 81 MG tablet Take 1 tablet (81 mg total) by mouth daily. 30 tablet 3  . carvedilol (COREG) 12.5 MG tablet Take 1.5 tablets (18.75 mg total) by mouth 2 (two) times daily. 90 tablet 3  . colchicine 0.6 MG tablet Take 0.6 mg by mouth as needed (take 1 tablet every monday wednesday and friday).    Marland Kitchen digoxin (LANOXIN) 0.125 MG tablet Take 0.5 tablets (0.0625 mg total) by mouth every other day. 15 tablet 3  . furosemide (LASIX) 40 MG tablet Take 2 tablets (80 mg total) by mouth daily. 180 tablet 3  . hydrALAZINE (APRESOLINE) 25 MG tablet Take 1.5 tablets (37.5 mg total) by mouth 3 (three) times daily. 405 tablet 3  . isosorbide mononitrate (IMDUR) 30 MG 24 hr tablet Take 1 tablet (30 mg total) by mouth daily. 90 tablet 3  . spironolactone (ALDACTONE) 25 MG tablet TAKE 1 TABLET BY MOUTH EVERY DAY 90 tablet 3   No current facility-administered medications for this encounter.    BP 112/62   Pulse 67   Wt 165 lb (74.8 kg)   SpO2 100%   BMI 23.01 kg/m   General: NAD Neck: No JVD, no thyromegaly or thyroid nodule.  Lungs: Clear to auscultation bilaterally with normal respiratory effort. CV: Nondisplaced PMI.  Heart regular S1/S2 with widely split S2, no S3/S4, no murmur.  No peripheral edema.  No carotid bruit.  Normal pedal pulses.  Abdomen: Soft, nontender, no hepatosplenomegaly, no distention.  Skin: Intact without lesions or rashes.  Neurologic: Alert and oriented x 3.  Psych: Normal affect. Extremities: No clubbing or cyanosis.  HEENT: Normal.   Assessment/Plan: 1. Chronic systolic CHF: Nonischemic cardiomyopathy.  CO preserved on RHC in 6/16, but 10/16 CPX showed moderate to severe functional impairment (seems worse than his symptoms would suggest).  However, repeat CPX in 11/17, though submaximal, suggested no more than mild HF limitation. No significant CAD with coronary angiography in 6/16.   No hx heavy ETOH or drug abuse. No marked HTN.  Negative SPEP/UPEP.  Possible prior myocarditis.  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  He now has Medtronic ICD.  Last echo in 1/18 with EF 25% with normal RV size and systolic function.  He is euvolemic by exam and Optivol, tolerating current meds well.  NYHA class II.  - Continue Lasix 80 mg daily.  BMET today.  - Continue spironolactone 25 mg daily.  Follow K closely with elevated creatinine (has been ok).   - Continue Coreg 18.75 mg bid.  - Hold off on ACEI/ARB/Entresto at this time with elevated creatinine.  - Increase hydralazine to 37.5 mg tid, continue Imdur 30 mg daily.   - Continue digoxin 0.0625 every other day, check level today.  Will need to follow level carefully with elevated creatinine.  2. CKD:  Stage III.  Creatinine has been slowly up-trending.  BMET today.  He sees nephrology.  3. OSA: Bipap. 4. Gout: Gout controlled on allopurinol. 5. VT: Appropriate ICD discharge in 3/18, no recurrence.  - Continue Coreg - Hold off on anti-arrhythmic unless VT recurs.  6. Atrial fibrillation: Paroxysmal.  Very short episodes only noted by device interrogation.  He has had 2 episodes < 10 minutes at a time in the last year.  I discussed the threshold for anticoagulation with Dr. Caryl Comes (asymptomatic, found only by device monitoring).  He has had no prolonged or symptomatic atrial fibrillation, so will hold off on anticoagulation.  - Can continue ASA 81.  - If he develops prolonged or symptomatic atrial fibrillation, would anticoagulate.   Followup in 3 months.   [name] 05/15/2017

## 2017-05-15 NOTE — Patient Instructions (Signed)
Labs today (will call for abnormal results, otherwise no news is good news)  INCREASE Hydralazine to 37.5 mg (1.5 Tablets) Three times Daily  RESTART Asprin 81 mg (1 Tablet) Daily  Follow up in 3 Months

## 2017-05-25 ENCOUNTER — Other Ambulatory Visit: Payer: Self-pay | Admitting: Internal Medicine

## 2017-06-06 ENCOUNTER — Other Ambulatory Visit (HOSPITAL_COMMUNITY): Payer: Self-pay | Admitting: Cardiology

## 2017-06-11 ENCOUNTER — Ambulatory Visit (INDEPENDENT_AMBULATORY_CARE_PROVIDER_SITE_OTHER): Payer: Medicare Other

## 2017-06-11 ENCOUNTER — Telehealth: Payer: Self-pay

## 2017-06-11 DIAGNOSIS — Z95810 Presence of automatic (implantable) cardiac defibrillator: Secondary | ICD-10-CM

## 2017-06-11 DIAGNOSIS — I5022 Chronic systolic (congestive) heart failure: Secondary | ICD-10-CM | POA: Diagnosis not present

## 2017-06-11 NOTE — Progress Notes (Signed)
EPIC Encounter for ICM Monitoring  Patient Name: Miguel Roach is a 76 y.o. male Date: 06/11/2017 Primary Care Physican: Deland Pretty, MD Primary [place] Electrophysiologist: Druscilla Brownie Weight:unknown      Heart Failure questions reviewed, pt asymptomatic.   Thoracic impedance abnormal suggesting fluid accumulation since 05/16/2017 with exception of 1 day at baseline.   Patient admitted to not following low salt diet in the last couple of weeks.  Prescribed dosage: Furosemide 40 mg 2 tablets (80 mg total) daily  Labs: 02/12/2017 Creatinine 3.39, BUN 55, Potassium 4.3, Sodium 139, EGFR 16-19 12/14/2016 Creatinine 3.17, BUN 4.1,Potassium 4.4, Sodium 139, EGFR 18-21 11/22/2016 Creatinine 2.96, BUN 39, Potassium 4.3, Sodium 142, EGFR 19-22  11/14/2016 Creatinine 3.05, BUN 43, Potassium 4.6, Sodium 141, EGFR 19-22  08/22/2017Creatinine 2.53, BUN 44, Potassium 4.3, Sodium 140  05/01/2016 Creatinine 2.60, BUN 32, Potassium 4.0, Sodium 141, EGFR 23-26  04/26/2016 Creatinine 2.85, BUN 34, Potassium 4.0, Sodium 141, EGFR 20-24  Recommendations: No changes.  Advised to resume low salt diet since he is starting to retain the fluid again today.   Follow-up plan: ICM clinic phone appointment on 06/15/2017 to recheck fluid levels.   Copy of ICM check sent to Dr. Aundra Dubin and Dr. Lovena Le for review and if any recommendations will call back..   3 month ICM trend: 06/11/2017   1 Year ICM trend:      Rosalene Billings, RN 06/11/2017 5:06 PM

## 2017-06-11 NOTE — Telephone Encounter (Signed)
Returned patient call and assisted to with sending remote transmission.

## 2017-06-15 ENCOUNTER — Ambulatory Visit (INDEPENDENT_AMBULATORY_CARE_PROVIDER_SITE_OTHER): Payer: Medicare Other

## 2017-06-15 DIAGNOSIS — I5022 Chronic systolic (congestive) heart failure: Secondary | ICD-10-CM

## 2017-06-15 DIAGNOSIS — Z95810 Presence of automatic (implantable) cardiac defibrillator: Secondary | ICD-10-CM

## 2017-06-15 NOTE — Progress Notes (Signed)
EPIC Encounter for ICM Monitoring  Patient Name: Miguel Roach is a 76 y.o. male Date: 06/15/2017 Primary Care Physican: Deland Pretty, MD Primary [place] Electrophysiologist: Lovena Le Dry Weight:162 lbs         Heart Failure questions reviewed, pt asymptomatic and denied any fluid symptoms.  Weight is stable at 162 lbs.    Thoracic impedance abnormal suggesting fluid accumulation since 8/1 with exception of a couple of days at baseline.  Fluid index >threshold.  Prescribed dosage: Furosemide 40 mg 2 tablets (80 mg total) daily  Labs: 02/12/2017 Creatinine 3.39, BUN 55, Potassium 4.3, Sodium 139, EGFR 16-19 12/14/2016 Creatinine 3.17, BUN 4.1,Potassium 4.4, Sodium 139, EGFR 18-21 11/22/2016 Creatinine 2.96, BUN 39, Potassium 4.3, Sodium 142, EGFR 19-22  11/14/2016 Creatinine 3.05, BUN 43, Potassium 4.6, Sodium 141, EGFR 19-22  08/22/2017Creatinine 2.53, BUN 44, Potassium 4.3, Sodium 140  05/01/2016 Creatinine 2.60, BUN 32, Potassium 4.0, Sodium 141, EGFR 23-26  04/26/2016 Creatinine 2.85, BUN 34, Potassium 4.0, Sodium 141, EGFR 20-24  Recommendations:  Patient stated the fluid accumulation comes from not following low salt diet and he eats out a lot.  He and his wife are going to start eating at home more to control salt intake.   Follow-up plan: ICM clinic phone appointment on 07/02/2017 to recheck fluid levels.  Office appointment scheduled 08/20/2017 with Dr. Aundra Dubin.  Copy of ICM check sent to Dr. Aundra Dubin and Dr. Lovena Le for review and if any recommendations will call him back.  Advised him to call for any fluid symptoms.    3 month ICM trend: 06/15/2017   1 Year ICM trend:      Rosalene Billings, RN 06/15/2017 10:59 AM

## 2017-07-03 ENCOUNTER — Telehealth: Payer: Self-pay

## 2017-07-03 ENCOUNTER — Ambulatory Visit (INDEPENDENT_AMBULATORY_CARE_PROVIDER_SITE_OTHER): Payer: Self-pay

## 2017-07-03 DIAGNOSIS — Z95810 Presence of automatic (implantable) cardiac defibrillator: Secondary | ICD-10-CM

## 2017-07-03 DIAGNOSIS — I5022 Chronic systolic (congestive) heart failure: Secondary | ICD-10-CM

## 2017-07-03 NOTE — Telephone Encounter (Signed)
Remote ICM transmission received.  Attempted call to patient and left detailed message regarding transmission and next ICM scheduled for 07/30/2017.  Advised to return call for any fluid symptoms or questions.

## 2017-07-03 NOTE — Progress Notes (Signed)
EPIC Encounter for ICM Monitoring  Patient Name: Miguel Roach is a 76 y.o. male Date: 07/03/2017 Primary Care Physican: Deland Pretty, MD Primary [place] Electrophysiologist: Lovena Le Dry Weight:Previous weight 162 lbs      Attempted call to patient and unable to reach.  Left detailed message regarding transmission.  Transmission reviewed.    Thoracic impedance trending just below baseline abnormal suggesting fluid accumulation 06/26/17.  Prescribed dosage: Furosemide 40 mg 2 tablets (80 mg total) daily  Labs: 02/12/2017 Creatinine 3.39, BUN 55, Potassium 4.3, Sodium 139, EGFR 16-19 12/14/2016 Creatinine 3.17, BUN 4.1,Potassium 4.4, Sodium 139, EGFR 18-21 11/22/2016 Creatinine 2.96, BUN 39, Potassium 4.3, Sodium 142, EGFR 19-22  11/14/2016 Creatinine 3.05, BUN 43, Potassium 4.6, Sodium 141, EGFR 19-22  08/22/2017Creatinine 2.53, BUN 44, Potassium 4.3, Sodium 140  05/01/2016 Creatinine 2.60, BUN 32, Potassium 4.0, Sodium 141, EGFR 23-26  04/26/2016 Creatinine 2.85, BUN 34, Potassium 4.0, Sodium 141, EGFR 20-24  Recommendations: Left voice mail with ICM number and encouraged to call for fluid symptoms.  Follow-up plan: ICM clinic phone appointment on 07/30/2017. Office appointment scheduled 08/20/2017 with Dr. Aundra Dubin.  Copy of ICM check sent to Dr. Aundra Dubin and Dr. Lovena Le for review and recommendations if needed.   3 month ICM trend: 07/03/2017   1 Year ICM trend:      Rosalene Billings, RN 07/03/2017 9:55 AM

## 2017-07-05 ENCOUNTER — Other Ambulatory Visit (HOSPITAL_COMMUNITY): Payer: Self-pay | Admitting: Cardiology

## 2017-07-13 ENCOUNTER — Other Ambulatory Visit (HOSPITAL_COMMUNITY): Payer: Self-pay | Admitting: Cardiology

## 2017-07-13 MED ORDER — ASPIRIN EC 81 MG PO TBEC: 81.0000 mg | DELAYED_RELEASE_TABLET | Freq: Every day | ORAL | 3 refills | Status: DC

## 2017-07-30 ENCOUNTER — Ambulatory Visit (INDEPENDENT_AMBULATORY_CARE_PROVIDER_SITE_OTHER): Payer: Medicare Other | Admitting: *Deleted

## 2017-07-30 DIAGNOSIS — I5022 Chronic systolic (congestive) heart failure: Secondary | ICD-10-CM

## 2017-07-30 DIAGNOSIS — I428 Other cardiomyopathies: Secondary | ICD-10-CM

## 2017-07-30 DIAGNOSIS — Z95810 Presence of automatic (implantable) cardiac defibrillator: Secondary | ICD-10-CM

## 2017-07-30 NOTE — Progress Notes (Signed)
EPIC Encounter for ICM Monitoring  Patient Name: Miguel Roach is a 76 y.o. male Date: 07/30/2017 Primary Care Physican: Deland Pretty, MD Primary Cardiologist:[place] Electrophysiologist: Lovena Le Dry Weight:163 lbs        Heart failure questions and patient denied any symptoms   Thoracic impedance normal.  Prescribed dosage: Furosemide 40 mg 2 tablets (80 mg total) daily  Labs: 05/15/2017 Creatinine 3.12, BUN 42, Potassium 4.2, Sodium 137, EGFR 18-21 02/12/2017 Creatinine 3.39, BUN 55, Potassium 4.3, Sodium 139, EGFR 16-19 12/14/2016 Creatinine 3.17, BUN 4.1,Potassium 4.4, Sodium 139, EGFR 18-21 11/22/2016 Creatinine 2.96, BUN 39, Potassium 4.3, Sodium 142, EGFR 19-22  11/14/2016 Creatinine 3.05, BUN 43, Potassium 4.6, Sodium 141, EGFR 19-22  08/22/2017Creatinine 2.53, BUN 44, Potassium 4.3, Sodium 140  05/01/2016 Creatinine 2.60, BUN 32, Potassium 4.0, Sodium 141, EGFR 23-26  04/26/2016 Creatinine 2.85, BUN 34, Potassium 4.0, Sodium 141, EGFR 20-24  Recommendations: No changes.  Encouraged to call for any fluid symptoms.   Follow-up plan: ICM clinic phone appointment on 08/30/2017.  Office appointment scheduled 08/20/2017 with Dr. Aundra Dubin.  Copy of ICM check sent to Dr. Lovena Le.   3 month ICM trend: 07/30/2017   1 Year ICM trend:      Rosalene Billings, RN 07/30/2017 4:26 PM

## 2017-07-30 NOTE — Progress Notes (Signed)
Remote ICD transmission.   

## 2017-08-01 LAB — CUP PACEART REMOTE DEVICE CHECK
Battery Voltage: 3.03 V
Brady Statistic RV Percent Paced: 0.01 %
Date Time Interrogation Session: 20181015084225
HighPow Impedance: 70 Ohm
Implantable Lead Implant Date: 20170208
Implantable Lead Model: 6935
Lead Channel Impedance Value: 342 Ohm
Lead Channel Pacing Threshold Pulse Width: 0.4 ms
Lead Channel Sensing Intrinsic Amplitude: 11.5 mV
Lead Channel Sensing Intrinsic Amplitude: 11.5 mV
Lead Channel Setting Pacing Amplitude: 2 V
Lead Channel Setting Pacing Pulse Width: 0.4 ms
MDC IDC LEAD LOCATION: 753860
MDC IDC MSMT BATTERY REMAINING LONGEVITY: 128 mo
MDC IDC MSMT LEADCHNL RV IMPEDANCE VALUE: 399 Ohm
MDC IDC MSMT LEADCHNL RV PACING THRESHOLD AMPLITUDE: 0.875 V
MDC IDC PG IMPLANT DT: 20170208
MDC IDC SET LEADCHNL RV SENSING SENSITIVITY: 0.3 mV

## 2017-08-03 ENCOUNTER — Encounter: Payer: Self-pay | Admitting: Cardiology

## 2017-08-07 ENCOUNTER — Telehealth (HOSPITAL_COMMUNITY): Payer: Self-pay | Admitting: *Deleted

## 2017-08-07 NOTE — Telephone Encounter (Signed)
Lab report - scanned  Order: [id number]  Status:  Final result Visible to patient:  Yes (MyChart)  Notes recorded by Darron Doom, RN on 08/07/2017 at 11:10 AM EDT Patient called back and he said he has a follow up scheduled with Dr. Posey Pronto on November 9th. He has stopped his spironolactone. No further questions. ------  Notes recorded by Scarlette Calico, RN on 08/07/2017 at 10:57 AM EDT Left message to call back ------  Notes recorded by Harvie Junior, CMA on 08/01/2017 at 4:09 PM EDT No answer will try patient again tomorrow. ------  Notes recorded by Larey Dresser, MD on 07/31/2017 at 4:47 PM EDT Creatinine continuing to rise. Now that > 3, would have him stop spironolactone. Make sure he has followup with nephrology.

## 2017-08-20 ENCOUNTER — Encounter (HOSPITAL_COMMUNITY): Payer: Self-pay | Admitting: Cardiology

## 2017-08-20 ENCOUNTER — Ambulatory Visit (HOSPITAL_COMMUNITY)
Admission: RE | Admit: 2017-08-20 | Discharge: 2017-08-20 | Disposition: A | Discharge status: Home / Self Care | Payer: Medicare Other | Source: Ambulatory Visit | Attending: Cardiology | Admitting: Cardiology

## 2017-08-20 VITALS — BP 133/67 | HR 62 | Wt 174.8 lb

## 2017-08-20 DIAGNOSIS — I5022 Chronic systolic (congestive) heart failure: Secondary | ICD-10-CM

## 2017-08-20 DIAGNOSIS — M109 Gout, unspecified: Secondary | ICD-10-CM | POA: Insufficient documentation

## 2017-08-20 DIAGNOSIS — Z95810 Presence of automatic (implantable) cardiac defibrillator: Secondary | ICD-10-CM | POA: Insufficient documentation

## 2017-08-20 DIAGNOSIS — I480 Paroxysmal atrial fibrillation: Secondary | ICD-10-CM | POA: Insufficient documentation

## 2017-08-20 DIAGNOSIS — Z7982 Long term (current) use of aspirin: Secondary | ICD-10-CM | POA: Insufficient documentation

## 2017-08-20 DIAGNOSIS — G4733 Obstructive sleep apnea (adult) (pediatric): Secondary | ICD-10-CM | POA: Diagnosis not present

## 2017-08-20 DIAGNOSIS — N183 Chronic kidney disease, stage 3 unspecified: Secondary | ICD-10-CM

## 2017-08-20 DIAGNOSIS — Z4502 Encounter for adjustment and management of automatic implantable cardiac defibrillator: Secondary | ICD-10-CM | POA: Diagnosis not present

## 2017-08-20 DIAGNOSIS — I2510 Atherosclerotic heart disease of native coronary artery without angina pectoris: Secondary | ICD-10-CM | POA: Diagnosis not present

## 2017-08-20 DIAGNOSIS — Z79899 Other long term (current) drug therapy: Secondary | ICD-10-CM | POA: Diagnosis not present

## 2017-08-20 DIAGNOSIS — I429 Cardiomyopathy, unspecified: Secondary | ICD-10-CM | POA: Insufficient documentation

## 2017-08-20 LAB — BASIC METABOLIC PANEL
ANION GAP: 7 (ref 5–15)
BUN: 36 mg/dL — ABNORMAL HIGH (ref 6–20)
CALCIUM: 9.1 mg/dL (ref 8.9–10.3)
CO2: 27 mmol/L (ref 22–32)
CREATININE: 3 mg/dL — AB (ref 0.61–1.24)
Chloride: 105 mmol/L (ref 101–111)
GFR, EST AFRICAN AMERICAN: 22 mL/min — AB (ref >60)
GFR, EST NON AFRICAN AMERICAN: 19 mL/min — AB (ref >60)
GLUCOSE: 104 mg/dL — AB (ref 65–99)
Potassium: 3.6 mmol/L (ref 3.5–5.1)
Sodium: 139 mmol/L (ref 135–145)

## 2017-08-20 LAB — DIGOXIN LEVEL: Digoxin Level: 0.6 ng/mL — ABNORMAL LOW (ref 0.8–2.0)

## 2017-08-20 MED ORDER — FUROSEMIDE 40 MG PO TABS: ORAL_TABLET | ORAL | 3 refills | Status: DC

## 2017-08-20 NOTE — Progress Notes (Signed)
P  Advanced Heart Failure Clinic Note   Patient ID: Miguel Roach, male   DOB: 11-05-1940, 75 y.o.   MRN: [id number] PCP: Dr. Shelia Media Cardiology: Dr. Aundra Dubin   76 yo with history of chronic systolic CHF/nonischemic cardiomyopathy and CKD presents for followup of CHF. He actually had an echo back in 2010 with EF 40-45% at the time.  He says that this really was not followed up upon by a cardiologist and that he did well for a number of years after that. In 6/16, he was admitted with acute systolic CHF.  Echo showed EF down to 20-25%.  Cardiac cath showed nonobstructive CAD.  His Lasix has been gradually increased.  This has brought his weight down and improved his breathing, but creatinine has risen. He had been on olmesartan but this was stopped because he thought it was causing cough.  He was then put on losartan, but this was stopped with rise in creatinine. Also intolerant to Bidil with orthostatic symptoms.  He was started on digoxin.  Echo in 12/16 showed persistently low EF, 15-20%.  Medtronic ICD was placed in 2/17.  Repeat echo 1/18 showed EF 25% with diffuse hypokinesis.   In 3/18, he had an episode of about 10 minutes atrial fibrillation noted on his ICD.  He also had 1 episode of VT terminated by an appropriate discharge.  He had push-mowed his grass and was very fatigued when he had the shock.  Since then, he has had no further arrhythmias (device interrogated today).   Recently, creatinine has been trending up slowly.  Most recently 3.65.  He is followed by Dr. Posey Pronto with nephrology.  He is off spironolactone with increased creatinine.  At last appointment, I increased hydralazine to 37.5 mg tid, but he felt weak/dizzy with the increase so is now back on 25 mg tid.  Currently doing well. No orthopnea/PND.  No dyspnea walking on flat ground.  He stays active.  Weight is up 9 lbs.   Medtronic device interrogation today: fluid index > threshold, impedance decreased.  No VT.  No atrial  fibrillation.  Labs (6/16): BNP 1954, TSH normal Labs (7/16): K 4.1, creatinine 1.72 Labs (8/16): creatinine 2.3 Labs (9/16): digoxin 0.3, K 4, creatinine 2.14, SPEP negative, UPEP negative Labs (10/16): K 4.6, creatinine 1.62 Labs (11/16): K 4.4, creatinine 2.33, digoxin 0.5 Labs (12/16): creatinine 2.1 (nephrology) Labs (1/17): digoxin 0.6, uric acid 10.4 Labs (2/17): K 4, creatinine 2.13, hgb 11.6 Labs (5/17): K 3.8, creatinine 2.22, digoxin 0.7 Labs (9/17): K 4.4, creatinine 2.88, LFTs normal, digoxin 1.0, hgb 11.8, LDL 88, HDL 50 Labs (1/18): digoxin 0.5 Labs (3/18): K 4.4, creatinine 3.17 Labs (4/18): K 4.3, creatinine 3.39, digoxin 0.6 Labs (10/18): K 4.5, creatinine 3.65, digoxin 0.4  PMH: 1. CKD: Stage III.  Follows with Dr Mercy Noga. 2. Gout 3. Chronic systolic CHF: Nonischemic cardiomyopathy.  EF 40-45% in 2010.  Echo (6/16) with EF 20-25%, severe LV dilation, mild MR, mild AI, moderate LAE.  LHC/RHC (6/16) with nonobstructive CAD; mean RA 2, RV 48/1, PA 47/18, mean PCWP 12, CI 3.2.  CPX (10/16) with peak VO2 13.1 (50% predicted), VE/VCO2 slope 39.3, RER 1.22 => moderate to severe functional limitation.  Echo (12/16) with EF 15-20%, severe LV dilation, diffuse hypokinesis, moderate AI, mild MR, normal RV size and systolic function. Medtronic ICD.  - CPX (11/17): Submaximal with RER 0.95, VE/VCO2 slope 27, peak VO2 18.2.  Suspect no more than mild HF limitation.  - Echo (1/18): EF  25%, diffuse hypokinesis, normal RV size and systolic function, mild AI.  4. ACEI cough.  5. Pleural effusion: Thoracentesis on right in 8/16.  It was a transudate, likely due to CHF.  6. OSA: To start Bipap.  7. Esophageal duplication cyst.  8. High resolution CT chest 12/17: No evidence for interstitial lung disease.  9. Atrial fibrillation: Paroxysmal, 1 10 minute episode in 3/18.  10. VT: ICD discharge 3/18.   SH: Married, retired Pharmacist, hospital, never smoked, no ETOH.   FH: Mother with "weak  heart." Brother with sickle cell anemia.   ROS: All systems reviewed and negative except as per HPI.   Current Outpatient Medications  Medication Sig Dispense Refill  . allopurinol (ZYLOPRIM) 100 MG tablet Take 2 tablets (200 mg total) by mouth daily. 180 tablet 3  . carvedilol (COREG) 12.5 MG tablet Take 1.5 tablets (18.75 mg total) by mouth 2 (two) times daily. (Patient taking differently: Take 12.5 mg 2 (two) times daily by mouth. ) 90 tablet 3  . colchicine 0.6 MG tablet Take 0.6 mg by mouth as needed (take 1 tablet every monday wednesday and friday).    Marland Kitchen digoxin (LANOXIN) 0.125 MG tablet Take 0.5 tablets (0.0625 mg total) by mouth every other day. 15 tablet 3  . furosemide (LASIX) 40 MG tablet Alternate 80 mg (2 tabs) in AM, then 40 mg (1 tab) in PM with 80 mg (2 tabs), twice a day 180 tablet 3  . hydrALAZINE (APRESOLINE) 25 MG tablet Take 1.5 tablets (37.5 mg total) by mouth 3 (three) times daily. (Patient taking differently: Take 25 mg 3 (three) times daily by mouth. ) 405 tablet 3  . isosorbide mononitrate (IMDUR) 30 MG 24 hr tablet TAKE 1 TABLET (30 MG TOTAL) BY MOUTH DAILY. 30 tablet 6   No current facility-administered medications for this encounter.    BP 133/67   Pulse 62   Wt 174 lb 12.8 oz (79.3 kg)   SpO2 100%   BMI 24.38 kg/m   General: NAD Neck: JVP 8 cm with HJR, no thyromegaly or thyroid nodule.  Lungs: Clear to auscultation bilaterally with normal respiratory effort. CV: Nondisplaced PMI.  Heart regular S1/S2 with widely split S2, no S3/S4, no murmur.  No peripheral edema.  No carotid bruit.  Normal pedal pulses.  Abdomen: Soft, nontender, no hepatosplenomegaly, no distention.  Skin: Intact without lesions or rashes.  Neurologic: Alert and oriented x 3.  Psych: Normal affect. Extremities: No clubbing or cyanosis.  HEENT: Normal.   Assessment/Plan: 1. Chronic systolic CHF: Nonischemic cardiomyopathy.  CO preserved on RHC in 6/16, but 10/16 CPX showed moderate to  severe functional impairment (seems worse than his symptoms would suggest).  However, repeat CPX in 11/17, though submaximal, suggested no more than mild HF limitation. No significant CAD with coronary angiography in 6/16.  No hx heavy ETOH or drug abuse. No marked HTN.  Negative SPEP/UPEP.  Possible prior myocarditis.  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  He now has Medtronic ICD.  Last echo in 1/18 with EF 25% with normal RV size and systolic function.  NYHA class II but volume overload by exam and Optivol, and weight is up.   - Increase Lasix to 80 mg bid x 4 days, then alternate Lasix 80 mg bid with Lasix 80 qam/40 qpm.  BMET today and repeat in 2 wks.   - Off spironolactone with elevated creatinine.    - Continue Coreg 12.5 mg bid.  - Hold  off on ACEI/ARB/Entresto at this time with elevated creatinine.  - Continue hydralazine 25 mg tid and Imdur 30 mg daily, he did not tolerate increase.   - Continue digoxin 0.0625 every other day, check level today.  Will need to follow level carefully with elevated creatinine.  2. CKD: Stage III.  Creatinine has been slowly up-trending.  BMET today.  He sees nephrology.  3. OSA: Bipap. 4. Gout: Gout controlled on allopurinol. 5. VT: Appropriate ICD discharge in 3/18, no recurrence.  - Continue Coreg - Hold off on anti-arrhythmic unless VT recurs.  6. Atrial fibrillation: Paroxysmal.  Very short episodes only noted by device interrogation.  He has had 2 episodes < 10 minutes at a time in the last year.  I discussed the threshold for anticoagulation with Dr. Caryl Comes (asymptomatic, found only by device monitoring).  He has had no prolonged or symptomatic atrial fibrillation, so will hold off on anticoagulation.  - Can continue ASA 81.  - If he develops prolonged or symptomatic atrial fibrillation, would anticoagulate.   Followup in 6 wks.    [name] 08/20/2017

## 2017-08-20 NOTE — Patient Instructions (Addendum)
Take 80 mg (2 tabs), twice a day for 4 days   Take Furosamide 80 mg (2 tabs) in AM, then 1 (tab in PM)   -alternating with 80 mg (2 tabs), twice a day  Labs drawn today (if we do not call you, then your lab work was stable)   Your physician recommends that you return for lab work in: 2 weeks  Your physician recommends that you schedule a follow-up appointment in: 6 weeks

## 2017-08-30 ENCOUNTER — Ambulatory Visit (INDEPENDENT_AMBULATORY_CARE_PROVIDER_SITE_OTHER): Payer: Medicare Other

## 2017-08-30 DIAGNOSIS — I5022 Chronic systolic (congestive) heart failure: Secondary | ICD-10-CM | POA: Diagnosis not present

## 2017-08-30 DIAGNOSIS — Z95810 Presence of automatic (implantable) cardiac defibrillator: Secondary | ICD-10-CM

## 2017-08-30 NOTE — Progress Notes (Signed)
Increase Lasix to 80 mg bid with BMET 1 week.

## 2017-08-30 NOTE — Progress Notes (Signed)
EPIC Encounter for ICM Monitoring  Patient Name: RMANI KELLOGG is a 76 y.o. male Date: 08/30/2017 Primary Care Physican: Deland Pretty, MD Primary Cardiologist:[place] Electrophysiologist: Lovena Le Nephrologist: [facility] - Patel Dry Weight:172.0 lbs       Heart Failure questions reviewed, pt asymptomatic.  Patient feeling good and still going to gym for exercise.    Thoracic impedance abnormal suggesting fluid accumulation since 08/04/2017.   Prescribed dosage: Furosemide 40 mg -alternate 80 mg (2 tabs) in AM, then 40 mg (1 tab) in PM with 80 mg (2 tabs), twice a day (changed at last OV with Dr Aundra Dubin 08/20/2017)  Labs: 08/20/2017 Creatinine 3.00, BUN 36, Potassium 3.6, Sodium 139, EGFR 19-22 07/24/2017 Creatinine 3.65, BUN 72, Potassium 4.5, Sodium 143, EGFR 15 05/15/2017 Creatinine 3.12, BUN 42, Potassium 4.2, Sodium 137, EGFR 18-21 02/12/2017 Creatinine 3.39, BUN 55, Potassium 4.3, Sodium 139, EGFR 16-19 12/14/2016 Creatinine 3.17, BUN 4.1,Potassium 4.4, Sodium 139, EGFR 18-21 11/22/2016 Creatinine 2.96, BUN 39, Potassium 4.3, Sodium 142, EGFR 19-22  11/14/2016 Creatinine 3.05, BUN 43, Potassium 4.6, Sodium 141, EGFR 19-22  08/22/2017Creatinine 2.53, BUN 44, Potassium 4.3, Sodium 140  05/01/2016 Creatinine 2.60, BUN 32, Potassium 4.0, Sodium 141, EGFR 23-26  04/26/2016 Creatinine 2.85, BUN 34, Potassium 4.0, Sodium 141, EGFR 20-24  Recommendations: Sent to Dr Aundra Dubin for review.   Patient has been taking Furosemide 80 mg AM and 40 mg in PM daily.    Discussed salt intake and patient eats at restaurants frequently, for example today he had [employer] sandwich.  Advised the sandwich has 1350 mg of salt and he should be limiting salt to <2000 mg.  He was not aware of salt amount in restaurant foods.   Follow-up plan: ICM clinic phone appointment on 09/13/2017 to recheck fluid levels.  Office appointment scheduled 10/03/2017 with Dr. Aundra Dubin.  Copy of ICM check sent to Dr.  Aundra Dubin and Dr. Lovena Le.   3 month ICM trend: 08/30/2017    1 Year ICM trend:       Rosalene Billings, RN 08/30/2017 12:02 PM

## 2017-08-31 MED ORDER — FUROSEMIDE 40 MG PO TABS: ORAL_TABLET | ORAL | 3 refills | Status: DC

## 2017-08-31 NOTE — Progress Notes (Signed)
Call to patient.  Advised Dr Aundra Dubin ordered to change Furosemide to 40 mg 2 tablets (80 mg total) twice a day.  Advised order for labs will be on 09/10/2017 since office is closed for the holiday in a week.  He agreed to labs.

## 2017-09-03 ENCOUNTER — Ambulatory Visit (HOSPITAL_COMMUNITY)
Admission: RE | Admit: 2017-09-03 | Discharge: 2017-09-03 | Disposition: A | Discharge status: Home / Self Care | Payer: Medicare Other | Source: Ambulatory Visit | Attending: Cardiology | Admitting: Cardiology

## 2017-09-03 DIAGNOSIS — I5022 Chronic systolic (congestive) heart failure: Secondary | ICD-10-CM | POA: Insufficient documentation

## 2017-09-03 LAB — BASIC METABOLIC PANEL
ANION GAP: 9 (ref 5–15)
BUN: 56 mg/dL — AB (ref 6–20)
CALCIUM: 9.4 mg/dL (ref 8.9–10.3)
CO2: 26 mmol/L (ref 22–32)
Chloride: 105 mmol/L (ref 101–111)
Creatinine, Ser: 3.77 mg/dL — ABNORMAL HIGH (ref 0.61–1.24)
GFR, EST AFRICAN AMERICAN: 17 mL/min — AB (ref >60)
GFR, EST NON AFRICAN AMERICAN: 14 mL/min — AB (ref >60)
Glucose, Bld: 140 mg/dL — ABNORMAL HIGH (ref 65–99)
POTASSIUM: 3.6 mmol/L (ref 3.5–5.1)
Sodium: 140 mmol/L (ref 135–145)

## 2017-09-04 ENCOUNTER — Telehealth (HOSPITAL_COMMUNITY): Payer: Self-pay | Admitting: *Deleted

## 2017-09-04 DIAGNOSIS — I5022 Chronic systolic (congestive) heart failure: Secondary | ICD-10-CM

## 2017-09-04 MED ORDER — FUROSEMIDE 40 MG PO TABS: ORAL_TABLET | ORAL | 3 refills | Status: DC

## 2017-09-04 NOTE — Telephone Encounter (Signed)
-----   Message from Larey Dresser, MD sent at 09/04/2017 12:21 PM EST ----- BUN/creatinine going up.  Make sure he has nephrology followup and decrease Lasix to 80 qam/40 qpm (every day, rather than alternating with Lasix 80 mg bid).

## 2017-09-10 ENCOUNTER — Ambulatory Visit (HOSPITAL_COMMUNITY)
Admission: RE | Admit: 2017-09-10 | Discharge: 2017-09-10 | Disposition: A | Discharge status: Home / Self Care | Payer: Medicare Other | Source: Ambulatory Visit | Attending: Cardiology | Admitting: Cardiology

## 2017-09-10 DIAGNOSIS — I5022 Chronic systolic (congestive) heart failure: Secondary | ICD-10-CM | POA: Diagnosis present

## 2017-09-10 LAB — BASIC METABOLIC PANEL
Anion gap: 6 (ref 5–15)
BUN: 49 mg/dL — AB (ref 6–20)
CALCIUM: 9.2 mg/dL (ref 8.9–10.3)
CO2: 28 mmol/L (ref 22–32)
Chloride: 107 mmol/L (ref 101–111)
Creatinine, Ser: 3.24 mg/dL — ABNORMAL HIGH (ref 0.61–1.24)
GFR calc Af Amer: 20 mL/min — ABNORMAL LOW (ref >60)
GFR, EST NON AFRICAN AMERICAN: 17 mL/min — AB (ref >60)
Glucose, Bld: 94 mg/dL (ref 65–99)
POTASSIUM: 4 mmol/L (ref 3.5–5.1)
Sodium: 141 mmol/L (ref 135–145)

## 2017-09-13 ENCOUNTER — Ambulatory Visit (INDEPENDENT_AMBULATORY_CARE_PROVIDER_SITE_OTHER): Payer: Self-pay

## 2017-09-13 ENCOUNTER — Telehealth: Payer: Self-pay

## 2017-09-13 DIAGNOSIS — Z95810 Presence of automatic (implantable) cardiac defibrillator: Secondary | ICD-10-CM

## 2017-09-13 DIAGNOSIS — I5022 Chronic systolic (congestive) heart failure: Secondary | ICD-10-CM

## 2017-09-13 NOTE — Progress Notes (Signed)
EPIC Encounter for ICM Monitoring  Patient Name: Miguel Roach is a 76 y.o. male Date: 09/13/2017 Primary Care Physican: Pharr, Walter, MD Primary Cardiologist:[place] Electrophysiologist: [place] Nephrologist: [facility] - Patel Dry Weight:Previous weight172.0lbs         Attempted call to patient and unable to reach.  Left message to return call.  Transmission reviewed.    Thoracic impedance continues to be abnormal suggesting fluid accumulation but did show some improvement 08/31/2017 to 09/06/2017.  Prescribed dosage: Furosemide 40 mg take 2 tablets (80 mg total) every AM and 1 tablet (40 mg total) every PM  Labs: 09/10/2017 Creatinine 3.24, BUN 49, Potassium 4.0, Sodium 141, EGFR 17-20 09/03/2017 Creatinine 3.77, BUN 26, Potassium 3.6, Sodium 140, EGFR 14-17 08/20/2017 Creatinine 3.00, BUN 36, Potassium 3.6, Sodium 139, EGFR 19-22 07/24/2017 Creatinine 3.65, BUN 72, Potassium 4.5, Sodium 143, EGFR 15 05/15/2017 Creatinine 3.12, BUN 42, Potassium 4.2, Sodium 137, EGFR 18-21 02/12/2017 Creatinine 3.39, BUN 55, Potassium 4.3, Sodium 139, EGFR 16-19 12/14/2016 Creatinine 3.17, BUN 4.1,Potassium 4.4, Sodium 139, EGFR 18-21 11/22/2016 Creatinine 2.96, BUN 39, Potassium 4.3, Sodium 142, EGFR 19-22  11/14/2016 Creatinine 3.05, BUN 43, Potassium 4.6, Sodium 141, EGFR 19-22  08/22/2017Creatinine 2.53, BUN 44, Potassium 4.3, Sodium 140  05/01/2016 Creatinine 2.60, BUN 32, Potassium 4.0, Sodium 141, EGFR 23-26  04/26/2016 Creatinine 2.85, BUN 34, Potassium 4.0, Sodium 141, EGFR 20-24  Recommendations: NONE - Unable to reach.  Follow-up plan: ICM clinic phone appointment on 09/24/2017 to recheck fluid levels.  Office appointment scheduled 10/03/2017 with Dr. McLean.  Copy of ICM check sent to Dr. Taylor and Dr. McLean for review.   3 month ICM trend: 09/13/2017    1 Year ICM trend:       Laurie S Short, RN 09/13/2017 4:36 PM    

## 2017-09-13 NOTE — Telephone Encounter (Signed)
Remote ICM transmission received.  Attempted call to patient and left message to return call. 

## 2017-09-14 MED ORDER — FUROSEMIDE 40 MG PO TABS: ORAL_TABLET | ORAL | 3 refills | Status: DC

## 2017-09-14 NOTE — Progress Notes (Signed)
Call to patient and advised Dr Miguel Roach has changed Lasix dosage.  Miguel Roach is to take Lasix 40 mg 2 tablets (80 mg total) by mouth twice a day every other day alternating with Lasix 40 mg 2 tablets (80 mg total) AM and 1 tablet (40 mg total) PM every other day.  BMET to be drawn 09/21/2017.  Miguel Roach has enough Lasix on hand for the change.

## 2017-09-14 NOTE — Progress Notes (Signed)
Increase Lasix to 80 mg bid alternating with 80 qam/40 qpm.  BMET 1 week.

## 2017-09-14 NOTE — Progress Notes (Signed)
Patient returned call.  He said he is feeling fine.  He had a nephrologist appointment yesterday and said Creatinine is stable.  Reviewed transmission and stated it looks like fluid accumulation decreased for a few days but he is starting to retain more fluid again in the last few days.  Weight is stable at 170 lbs.  He denied any fluid symptoms.    MEDS:  He currently takes Furosemide 80 mg AM and 40 mg PM as ordered by Dr Aundra Dubin.   Recommendation:  Advised would send copy to Dr Lovena Le and Dr Aundra Dubin for review and if recommendations will call him back.  Advised him to make sure he is limiting salt intake to <2000 mg daily and he stated he is doing that.    Next ICM follow up to recheck fluid levels 09/24/2017.

## 2017-09-18 ENCOUNTER — Other Ambulatory Visit: Payer: Self-pay | Admitting: Cardiology

## 2017-09-21 ENCOUNTER — Ambulatory Visit (HOSPITAL_COMMUNITY)
Admission: RE | Admit: 2017-09-21 | Discharge: 2017-09-21 | Disposition: A | Discharge status: Home / Self Care | Payer: Medicare Other | Source: Ambulatory Visit | Attending: Internal Medicine | Admitting: Internal Medicine

## 2017-09-21 DIAGNOSIS — Z95810 Presence of automatic (implantable) cardiac defibrillator: Secondary | ICD-10-CM | POA: Diagnosis present

## 2017-09-21 DIAGNOSIS — I5022 Chronic systolic (congestive) heart failure: Secondary | ICD-10-CM | POA: Diagnosis present

## 2017-09-24 ENCOUNTER — Ambulatory Visit (INDEPENDENT_AMBULATORY_CARE_PROVIDER_SITE_OTHER): Payer: Self-pay

## 2017-09-24 DIAGNOSIS — Z95810 Presence of automatic (implantable) cardiac defibrillator: Secondary | ICD-10-CM

## 2017-09-24 DIAGNOSIS — I5022 Chronic systolic (congestive) heart failure: Secondary | ICD-10-CM

## 2017-09-27 NOTE — Progress Notes (Signed)
EPIC Encounter for ICM Monitoring  Patient Name: Miguel Roach is a 76 y.o. male Date: 09/27/2017 Primary Care Physican: Deland Pretty, MD Primary Cardiologist:[place] Electrophysiologist: Lovena Le Nephrologist: [facility] - Patel Dry Weight:168.8lbs      Heart Failure questions reviewed, pt asymptomatic.  Weight has dropped from 170 lbs to 168.8 lbs.     Thoracic impedance continues to be abnormal suggesting fluid accumulation despite Furosemide increase on 09/14/2017.  Prescribed dosage: Furosemide 40 mg 2 tablets (80 mg total) by mouth twice a day alternating with Lasix 40 mg 2 tablets (80 mg total) AM and 1 tablet (40 mg total) PM every other day.  Labs: 09/10/2017 Creatinine 3.24, BUN 49, Potassium 4.0, Sodium 141, EGFR 17-20 09/03/2017 Creatinine 3.77, BUN 26, Potassium 3.6, Sodium 140, EGFR 14-17 08/20/2017 Creatinine 3.00, BUN 36, Potassium 3.6, Sodium 139, EGFR 19-22 07/24/2017 Creatinine 3.65, BUN 72, Potassium 4.5, Sodium 143, EGFR 15 05/15/2017 Creatinine 3.12, BUN 42, Potassium 4.2, Sodium 137, EGFR 18-21 02/12/2017 Creatinine 3.39, BUN 55, Potassium 4.3, Sodium 139, EGFR 16-19 12/14/2016 Creatinine 3.17, BUN 4.1,Potassium 4.4, Sodium 139, EGFR 18-21 11/22/2016 Creatinine 2.96, BUN 39, Potassium 4.3, Sodium 142, EGFR 19-22  11/14/2016 Creatinine 3.05, BUN 43, Potassium 4.6, Sodium 141, EGFR 19-22  08/22/2017Creatinine 2.53, BUN 44, Potassium 4.3, Sodium 140  05/01/2016 Creatinine 2.60, BUN 32, Potassium 4.0, Sodium 141, EGFR 23-26  04/26/2016 Creatinine 2.85, BUN 34, Potassium 4.0, Sodium 141, EGFR 20-24  Recommendations:   Advised if any recommendations will call him back.   Follow-up plan: ICM clinic phone appointment on 10/12/2017.  Office appointment scheduled 10/03/2017 with Dr. Aundra Dubin.  Copy of ICM check sent to Dr. Lovena Le and Dr. Aundra Dubin.   3 month ICM trend: 09/24/2017    1 Year ICM trend:       Rosalene Billings, RN 09/27/2017 1:25  PM

## 2017-10-03 ENCOUNTER — Encounter (HOSPITAL_COMMUNITY): Payer: Self-pay | Admitting: Cardiology

## 2017-10-03 ENCOUNTER — Ambulatory Visit (HOSPITAL_COMMUNITY)
Admission: RE | Admit: 2017-10-03 | Discharge: 2017-10-03 | Disposition: A | Discharge status: Home / Self Care | Payer: Medicare Other | Source: Ambulatory Visit | Attending: Cardiology | Admitting: Cardiology

## 2017-10-03 VITALS — BP 118/72 | HR 72 | Wt 172.8 lb

## 2017-10-03 DIAGNOSIS — I429 Cardiomyopathy, unspecified: Secondary | ICD-10-CM | POA: Diagnosis not present

## 2017-10-03 DIAGNOSIS — Z8249 Family history of ischemic heart disease and other diseases of the circulatory system: Secondary | ICD-10-CM | POA: Insufficient documentation

## 2017-10-03 DIAGNOSIS — M109 Gout, unspecified: Secondary | ICD-10-CM | POA: Insufficient documentation

## 2017-10-03 DIAGNOSIS — I480 Paroxysmal atrial fibrillation: Secondary | ICD-10-CM

## 2017-10-03 DIAGNOSIS — N183 Chronic kidney disease, stage 3 (moderate): Secondary | ICD-10-CM | POA: Insufficient documentation

## 2017-10-03 DIAGNOSIS — Z79899 Other long term (current) drug therapy: Secondary | ICD-10-CM | POA: Insufficient documentation

## 2017-10-03 DIAGNOSIS — I5022 Chronic systolic (congestive) heart failure: Secondary | ICD-10-CM | POA: Insufficient documentation

## 2017-10-03 DIAGNOSIS — I2510 Atherosclerotic heart disease of native coronary artery without angina pectoris: Secondary | ICD-10-CM | POA: Insufficient documentation

## 2017-10-03 DIAGNOSIS — G4733 Obstructive sleep apnea (adult) (pediatric): Secondary | ICD-10-CM | POA: Insufficient documentation

## 2017-10-03 LAB — BASIC METABOLIC PANEL
ANION GAP: 8 (ref 5–15)
BUN: 34 mg/dL — ABNORMAL HIGH (ref 6–20)
CHLORIDE: 104 mmol/L (ref 101–111)
CO2: 24 mmol/L (ref 22–32)
CREATININE: 2.99 mg/dL — AB (ref 0.61–1.24)
Calcium: 9.1 mg/dL (ref 8.9–10.3)
GFR calc non Af Amer: 19 mL/min — ABNORMAL LOW (ref >60)
GFR, EST AFRICAN AMERICAN: 22 mL/min — AB (ref >60)
Glucose, Bld: 136 mg/dL — ABNORMAL HIGH (ref 65–99)
POTASSIUM: 3.6 mmol/L (ref 3.5–5.1)
SODIUM: 136 mmol/L (ref 135–145)

## 2017-10-03 LAB — DIGOXIN LEVEL

## 2017-10-03 NOTE — Patient Instructions (Signed)
Labs drawn today (if we do not call you, then your lab work was stable)   Your physician recommends that you schedule a follow-up appointment in: 3 months with Dr. McLean    

## 2017-10-03 NOTE — Progress Notes (Signed)
P  Advanced Heart Failure Clinic Note   Patient ID: Miguel Roach, male   DOB: November 07, 1940, 76 y.o.   MRN: [id number] PCP: Dr. Shelia Media Cardiology: Dr. Aundra Dubin   76 yo with history of chronic systolic CHF/nonischemic cardiomyopathy and CKD presents for followup of CHF. He actually had an echo back in 2010 with EF 40-45% at the time.  He says that this really was not followed up upon by a cardiologist and that he did well for a number of years after that. In 6/16, he was admitted with acute systolic CHF.  Echo showed EF down to 20-25%.  Cardiac cath showed nonobstructive CAD.  His Lasix has been gradually increased.  This has brought his weight down and improved his breathing, but creatinine has risen. He had been on olmesartan but this was stopped because he thought it was causing cough.  He was then put on losartan, but this was stopped with rise in creatinine. Also intolerant to Bidil with orthostatic symptoms.  He was started on digoxin.  Echo in 12/16 showed persistently low EF, 15-20%.  Medtronic ICD was placed in 2/17.  Repeat echo 1/18 showed EF 25% with diffuse hypokinesis.   In 3/18, he had an episode of about 10 minutes atrial fibrillation noted on his ICD.  He also had 1 episode of VT terminated by an appropriate discharge.  He had push-mowed his grass and was very fatigued when he had the shock.  Since then, he has had no further arrhythmias (device interrogated today).   He returns today for followup of CHF.  At last appointment, he was volume overloaded and Lasix was increased.  He is doing well symptomatically.  Going to the [facility] 3-4 days/week.  No dyspnea walking on flat ground or up a flight of steps.  No orthopnea/PND.  No edema.  No chest pain.  He is trying to watch his sodium intake.    ECG (personally reviewed): NSR, inferolateral TWIs, narrow QRS  Labs (6/16): BNP 1954, TSH normal Labs (7/16): K 4.1, creatinine 1.72 Labs (8/16): creatinine 2.3 Labs (9/16): digoxin 0.3, K 4,  creatinine 2.14, SPEP negative, UPEP negative Labs (10/16): K 4.6, creatinine 1.62 Labs (11/16): K 4.4, creatinine 2.33, digoxin 0.5 Labs (12/16): creatinine 2.1 (nephrology) Labs (1/17): digoxin 0.6, uric acid 10.4 Labs (2/17): K 4, creatinine 2.13, hgb 11.6 Labs (5/17): K 3.8, creatinine 2.22, digoxin 0.7 Labs (9/17): K 4.4, creatinine 2.88, LFTs normal, digoxin 1.0, hgb 11.8, LDL 88, HDL 50 Labs (1/18): digoxin 0.5 Labs (3/18): K 4.4, creatinine 3.17 Labs (4/18): K 4.3, creatinine 3.39, digoxin 0.6 Labs (10/18): K 4.5, creatinine 3.65, digoxin 0.4 Labs (11/18): K 4, creatinine 3.24, digoxin 0.6  PMH: 1. CKD: Stage III.  Follows with Dr Posey Pronto.  2. Gout 3. Chronic systolic CHF: Nonischemic cardiomyopathy.  EF 40-45% in 2010.  Echo (6/16) with EF 20-25%, severe LV dilation, mild MR, mild AI, moderate LAE.  LHC/RHC (6/16) with nonobstructive CAD; mean RA 2, RV 48/1, PA 47/18, mean PCWP 12, CI 3.2.  CPX (10/16) with peak VO2 13.1 (50% predicted), VE/VCO2 slope 39.3, RER 1.22 => moderate to severe functional limitation.  Echo (12/16) with EF 15-20%, severe LV dilation, diffuse hypokinesis, moderate AI, mild MR, normal RV size and systolic function. Medtronic ICD.  - CPX (11/17): Submaximal with RER 0.95, VE/VCO2 slope 27, peak VO2 18.2.  Suspect no more than mild HF limitation.  - Echo (1/18): EF 25%, diffuse hypokinesis, normal RV size and systolic function, mild AI.  4. ACEI cough.  5. Pleural effusion: Thoracentesis on right in 8/16.  It was a transudate, likely due to CHF.  6. OSA: To start Bipap.  7. Esophageal duplication cyst.  8. High resolution CT chest 12/17: No evidence for interstitial lung disease.  9. Atrial fibrillation: Paroxysmal, 1 10 minute episode in 3/18.  10. VT: ICD discharge 3/18.   SH: Married, retired Pharmacist, hospital, never smoked, no ETOH.   FH: Mother with "weak heart." Brother with sickle cell anemia.   ROS: All systems reviewed and negative except as per HPI.    Current Outpatient Medications  Medication Sig Dispense Refill  . allopurinol (ZYLOPRIM) 100 MG tablet Take 2 tablets (200 mg total) by mouth daily. 180 tablet 3  . carvedilol (COREG) 12.5 MG tablet Take 1.5 tablets (18.75 mg total) by mouth 2 (two) times daily. (Patient taking differently: Take 12.5 mg 2 (two) times daily by mouth. ) 90 tablet 3  . colchicine 0.6 MG tablet Take 0.6 mg by mouth as needed (take 1 tablet every monday wednesday and friday).    Marland Kitchen digoxin (LANOXIN) 0.125 MG tablet Take 0.5 tablets (0.0625 mg total) by mouth every other day. 15 tablet 3  . furosemide (LASIX) 40 MG tablet Take 2 tablets (80 mg total) by mouth twice a day alternating with 2 tablets (80 mg total) AM and 1 tablet (40 mg total) PM every other day. 315 tablet 3  . hydrALAZINE (APRESOLINE) 25 MG tablet Take 1.5 tablets (37.5 mg total) by mouth 3 (three) times daily. (Patient taking differently: Take 25 mg 3 (three) times daily by mouth. ) 405 tablet 3  . isosorbide mononitrate (IMDUR) 30 MG 24 hr tablet TAKE 1 TABLET (30 MG TOTAL) BY MOUTH DAILY. 30 tablet 6   No current facility-administered medications for this encounter.    BP 118/72   Pulse 72   Wt 172 lb 12.8 oz (78.4 kg)   BMI 24.10 kg/m   General: NAD Neck: No JVD, no thyromegaly or thyroid nodule.  Lungs: Clear to auscultation bilaterally with normal respiratory effort. CV: Lateral PMI.  Heart regular S1/S2, no S3/S4, 2/6 HSM LLSB/apex.  No peripheral edema.  No carotid bruit.  Normal pedal pulses.  Abdomen: Soft, nontender, no hepatosplenomegaly, no distention.  Skin: Intact without lesions or rashes.  Neurologic: Alert and oriented x 3.  Psych: Normal affect. Extremities: No clubbing or cyanosis.  HEENT: Normal.   Assessment/Plan: 1. Chronic systolic CHF: Nonischemic cardiomyopathy.  CO preserved on RHC in 6/16, but 10/16 CPX showed moderate to severe functional impairment (seems worse than his symptoms would suggest).  However, repeat  CPX in 11/17, though submaximal, suggested no more than mild HF limitation. No significant CAD with coronary angiography in 6/16.  No hx heavy ETOH or drug abuse. No marked HTN.  Negative SPEP/UPEP.  Possible prior myocarditis.  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  He now has Medtronic ICD.  Last echo in 1/18 with EF 25% with normal RV size and systolic function.  NYHA class II.  Weight is down 2 lbs and he does not appear significantly volume overloaded on exam.  - Continue alternating Lasix 80 mg bid with Lasix 80 qam/40 qpm.  BMET today.  - Off spironolactone with elevated creatinine.    - Continue Coreg 12.5 mg bid.  - Hold off on ACEI/ARB/Entresto at this time with elevated creatinine.  - Continue hydralazine 25 mg tid and Imdur 30 mg daily, he did not tolerate increase.   -  Continue digoxin 0.0625 every other day, check level again today.  Will need to follow level carefully with elevated creatinine.  2. CKD: Stage III.  Creatinine has been slowly up-trending.  BMET today.  He sees nephrology.  3. OSA: Bipap. 4. Gout: Gout controlled on allopurinol. 5. VT: Appropriate ICD discharge in 3/18, no recurrence.  - Continue Coreg - Hold off on anti-arrhythmic unless VT recurs.  6. Atrial fibrillation: Paroxysmal.  Very short episodes only noted by device interrogation.  I discussed the threshold for anticoagulation with Dr. Caryl Comes (asymptomatic, found only by device monitoring).  He has had no prolonged or symptomatic atrial fibrillation, so will hold off on anticoagulation.  - Can continue ASA 81.  - If he develops prolonged or symptomatic atrial fibrillation, would anticoagulate.   Followup in 3 months.     [name] 10/03/2017

## 2017-10-08 ENCOUNTER — Other Ambulatory Visit (HOSPITAL_COMMUNITY): Payer: Self-pay | Admitting: Cardiology

## 2017-10-08 DIAGNOSIS — I5022 Chronic systolic (congestive) heart failure: Secondary | ICD-10-CM

## 2017-10-12 ENCOUNTER — Ambulatory Visit (INDEPENDENT_AMBULATORY_CARE_PROVIDER_SITE_OTHER): Payer: Self-pay

## 2017-10-12 DIAGNOSIS — Z95810 Presence of automatic (implantable) cardiac defibrillator: Secondary | ICD-10-CM

## 2017-10-12 DIAGNOSIS — I5022 Chronic systolic (congestive) heart failure: Secondary | ICD-10-CM

## 2017-10-12 NOTE — Progress Notes (Signed)
EPIC Encounter for ICM Monitoring  Patient Name: Miguel Roach is a 76 y.o. male Date: 10/12/2017 Primary Care Physican: Deland Pretty, MD Primary Cardiologist:[place] Electrophysiologist: Lovena Le Nephrologist: [facility] - Patel Dry Weight:Previous weight 168.8lbs      Attempted call to patient and unable to reach.  Left detailed message regarding transmission.  Transmission reviewed.    Thoracic impedance continues to be abnormal suggesting fluid accumulation (no change since patient was seen in office by Dr Aundra Dubin 10/03/17).  Prescribed dosage: Furosemide 40 mg 2 tablets (80 mg total) by mouth twice a day alternating with Lasix 40 mg 2 tablets (80 mg total) AM and 1 tablet (40 mg total) PM every other day.  Labs: 10/03/2017 Creatinine 2.99, BUN 34, Potassium 3.6, Sodium 136, EGFR 19-22 09/10/2017 Creatinine 3.24, BUN 49, Potassium 4.0, Sodium 141, EGFR 17-20 09/03/2017 Creatinine 3.77, BUN 26, Potassium 3.6, Sodium 140, EGFR 14-17 08/20/2017 Creatinine 3.00, BUN 36, Potassium 3.6, Sodium 139, EGFR 19-22 07/24/2017 Creatinine 3.65, BUN 72, Potassium 4.5, Sodium 143, EGFR 15 05/15/2017 Creatinine 3.12, BUN 42, Potassium 4.2, Sodium 137, EGFR 18-21 02/12/2017 Creatinine 3.39, BUN 55, Potassium 4.3, Sodium 139, EGFR 16-19 12/14/2016 Creatinine 3.17, BUN 4.1,Potassium 4.4, Sodium 139, EGFR 18-21 11/22/2016 Creatinine 2.96, BUN 39, Potassium 4.3, Sodium 142, EGFR 19-22  11/14/2016 Creatinine 3.05, BUN 43, Potassium 4.6, Sodium 141, EGFR 19-22  08/22/2017Creatinine 2.53, BUN 44, Potassium 4.3, Sodium 140  05/01/2016 Creatinine 2.60, BUN 32, Potassium 4.0, Sodium 141, EGFR 23-26  04/26/2016 Creatinine 2.85, BUN 34, Potassium 4.0, Sodium 141, EGFR 20-24  Recommendations: NONE - Unable to reach.  Follow-up plan: ICM clinic phone appointment on 10/29/2017.    Copy of ICM check sent to Dr. Aundra Dubin and Dr. Lovena Le.   3 month ICM trend: 10/12/2017    1 Year ICM trend:        Rosalene Billings, RN 10/12/2017 8:04 AM

## 2017-10-12 NOTE — Progress Notes (Signed)
Patient returned call and he stated he is feeling fine.  He denied any fluid symptoms.  Transmission reviewed.  Current 170.6 lbs.  Encouraged him to call if he has any fluid symptoms. Next transmission 10/29/2017.

## 2017-10-18 ENCOUNTER — Other Ambulatory Visit (HOSPITAL_COMMUNITY): Payer: Self-pay | Admitting: *Deleted

## 2017-10-18 DIAGNOSIS — I5022 Chronic systolic (congestive) heart failure: Secondary | ICD-10-CM

## 2017-10-18 DIAGNOSIS — Z95810 Presence of automatic (implantable) cardiac defibrillator: Secondary | ICD-10-CM

## 2017-10-29 ENCOUNTER — Ambulatory Visit (INDEPENDENT_AMBULATORY_CARE_PROVIDER_SITE_OTHER): Payer: Medicare Other | Admitting: *Deleted

## 2017-10-29 DIAGNOSIS — I5022 Chronic systolic (congestive) heart failure: Secondary | ICD-10-CM

## 2017-10-29 DIAGNOSIS — Z95810 Presence of automatic (implantable) cardiac defibrillator: Secondary | ICD-10-CM | POA: Diagnosis not present

## 2017-10-29 DIAGNOSIS — I428 Other cardiomyopathies: Secondary | ICD-10-CM

## 2017-10-29 NOTE — Progress Notes (Signed)
EPIC Encounter for ICM Monitoring  Patient Name: Miguel Roach is a 77 y.o. male Date: 10/29/2017 Primary Care Physican: Deland Pretty, MD Primary [facility] Electrophysiologist: Lovena Le Nephrologist: [facility] - Patel Dry Weight:170lbs      Heart Failure questions reviewed, pt asymptomatic.   Thoracic impedance abnormal suggesting fluid accumulation.  Prescribed dosage: Furosemide40 mg 2 tablets (80 mg total) by mouth twice a day alternating with Lasix 40 mg 2 tablets (80 mg total) AM and 1 tablet (40 mg total) PM every other day.  Labs: 10/03/2017 Creatinine 2.99, BUN 34, Potassium 3.6, Sodium 136, EGFR 19-22 09/10/2017 Creatinine 3.24, BUN 49, Potassium 4.0, Sodium 141, EGFR 17-20 09/03/2017 Creatinine 3.77, BUN 26, Potassium 3.6, Sodium 140, EGFR 14-17 08/20/2017 Creatinine 3.00, BUN 36, Potassium 3.6, Sodium 139, EGFR 19-22 07/24/2017 Creatinine 3.65, BUN 72, Potassium 4.5, Sodium 143, EGFR 15 05/15/2017 Creatinine 3.12, BUN 42, Potassium 4.2, Sodium 137, EGFR 18-21 02/12/2017 Creatinine 3.39, BUN 55, Potassium 4.3, Sodium 139, EGFR 16-19 12/14/2016 Creatinine 3.17, BUN 4.1,Potassium 4.4, Sodium 139, EGFR 18-21 11/22/2016 Creatinine 2.96, BUN 39, Potassium 4.3, Sodium 142, EGFR 19-22  11/14/2016 Creatinine 3.05, BUN 43, Potassium 4.6, Sodium 141, EGFR 19-22  08/22/2017Creatinine 2.53, BUN 44, Potassium 4.3, Sodium 140  05/01/2016 Creatinine 2.60, BUN 32, Potassium 4.0, Sodium 141, EGFR 23-26  04/26/2016 Creatinine 2.85, BUN 34, Potassium 4.0, Sodium 141, EGFR 20-24  Recommendations: No changes.  Encouraged to call for fluid symptoms.  Follow-up plan: ICM clinic phone appointment on 11/06/2017.  Office appointment scheduled 01/02/2018 with Dr. Aundra Dubin.  Copy of ICM check sent to Dr. Rayann Heman and Dr. Aundra Dubin for review and will call back if any recommendations.   3 month ICM trend: 10/29/2017    1 Year ICM trend:       Rosalene Billings,  RN 10/29/2017 8:52 AM

## 2017-10-31 LAB — CUP PACEART REMOTE DEVICE CHECK
Battery Remaining Longevity: 126 mo
HighPow Impedance: 61 Ohm
Implantable Lead Implant Date: 20170208
Implantable Lead Location: 753860
Implantable Lead Model: 6935
Implantable Pulse Generator Implant Date: 20170208
Lead Channel Impedance Value: 304 Ohm
Lead Channel Pacing Threshold Amplitude: 0.875 V
Lead Channel Pacing Threshold Pulse Width: 0.4 ms
Lead Channel Sensing Intrinsic Amplitude: 10 mV
Lead Channel Setting Pacing Pulse Width: 0.4 ms
Lead Channel Setting Sensing Sensitivity: 0.3 mV
MDC IDC MSMT BATTERY VOLTAGE: 3.03 V
MDC IDC MSMT LEADCHNL RV IMPEDANCE VALUE: 399 Ohm
MDC IDC MSMT LEADCHNL RV SENSING INTR AMPL: 10 mV
MDC IDC SESS DTM: 20190114062504
MDC IDC SET LEADCHNL RV PACING AMPLITUDE: 2 V
MDC IDC STAT BRADY RV PERCENT PACED: 0.02 %

## 2017-10-31 NOTE — Progress Notes (Signed)
Remote ICD transmission.   

## 2017-11-01 NOTE — Progress Notes (Signed)
Increase Lasix to 80 qam/40 qpm, repeat BMET in 10 days

## 2017-11-02 ENCOUNTER — Encounter: Payer: Self-pay | Admitting: Cardiology

## 2017-11-02 MED ORDER — FUROSEMIDE 40 MG PO TABS: ORAL_TABLET | ORAL | 3 refills | Status: DC

## 2017-11-02 NOTE — Addendum Note (Signed)
Addended by: Rosalene Billings on: 11/02/2017 11:06 AM   Modules accepted: Orders

## 2017-11-02 NOTE — Progress Notes (Addendum)
Spoke with patient.  Advised Dr Aundra Dubin ordered Furosemide 40 mg take 2 tablets (80 mg total) by mouth every morning and 1 tablet (40 mg total) every evening.  He verbalized understanding.  Agreed to BMET on 11/12/2016 at [facility].

## 2017-11-02 NOTE — Progress Notes (Signed)
Attempted call to patient to provide Dr Claris Gladden recommendations and left message for return call.

## 2017-11-06 ENCOUNTER — Ambulatory Visit (INDEPENDENT_AMBULATORY_CARE_PROVIDER_SITE_OTHER): Payer: Self-pay

## 2017-11-06 ENCOUNTER — Telehealth: Payer: Self-pay

## 2017-11-06 DIAGNOSIS — Z95810 Presence of automatic (implantable) cardiac defibrillator: Secondary | ICD-10-CM

## 2017-11-06 DIAGNOSIS — I5022 Chronic systolic (congestive) heart failure: Secondary | ICD-10-CM

## 2017-11-06 NOTE — Telephone Encounter (Signed)
Remote ICM transmission received.  Attempted call to patient and left detailed message per DPR regarding transmission and next ICM scheduled for 11/19/2017.  Advised to return call for any fluid symptoms or questions.

## 2017-11-06 NOTE — Progress Notes (Signed)
EPIC Encounter for ICM Monitoring  Patient Name: Miguel Roach is a 77 y.o. male Date: 11/06/2017 Primary Care Physican: Deland Pretty, MD Primary Cardiologist:[place]Lean Electrophysiologist: Lovena Le Nephrologist: [facility] - Patel Dry Weight:Prior weight 170lbs              Attempted call to patient and unable to reach.  Left detailed message regarding transmission.  Transmission reviewed.    Thoracic impedance improved since increase in long term Furosemide dosage on 10/29/17.  Prescribed dosage: Furosemide 40 mg take 2 tablets (80 mg total) by mouth every morning and 1 tablet (40 mg total) every evening.   Labs:  BMET to be drawn on 11/12/17 10/03/2017 Creatinine 2.99, BUN 34, Potassium 3.6, Sodium 136, EGFR 19-22 09/10/2017 Creatinine 3.24, BUN 49, Potassium 4.0, Sodium 141, EGFR 17-20 09/03/2017 Creatinine 3.77, BUN 26, Potassium 3.6, Sodium 140, EGFR 14-17 08/20/2017 Creatinine 3.00, BUN 36, Potassium 3.6, Sodium 139, EGFR 19-22 07/24/2017 Creatinine 3.65, BUN 72, Potassium 4.5, Sodium 143, EGFR 15 05/15/2017 Creatinine 3.12, BUN 42, Potassium 4.2, Sodium 137, EGFR 18-21 02/12/2017 Creatinine 3.39, BUN 55, Potassium 4.3, Sodium 139, EGFR 16-19 12/14/2016 Creatinine 3.17, BUN 4.1,Potassium 4.4, Sodium 139, EGFR 18-21 11/22/2016 Creatinine 2.96, BUN 39, Potassium 4.3, Sodium 142, EGFR 19-22  11/14/2016 Creatinine 3.05, BUN 43, Potassium 4.6, Sodium 141, EGFR 19-22  08/22/2017Creatinine 2.53, BUN 44, Potassium 4.3, Sodium 140  05/01/2016 Creatinine 2.60, BUN 32, Potassium 4.0, Sodium 141, EGFR 23-26  04/26/2016 Creatinine 2.85, BUN 34, Potassium 4.0, Sodium 141, EGFR 20-24  Recommendations: Left voice mail with ICM number and encouraged to call if experiencing any fluid symptoms.  Follow-up plan: ICM clinic phone appointment on 11/19/2017 to recheck fluid levels.  Office appointment scheduled 01/02/2018 with Dr. Aundra Dubin.  Copy of ICM check sent to Dr. Lovena Le and Dr. Aundra Dubin.     3 month ICM trend: 11/06/2017    1 Year ICM trend:       Rosalene Billings, RN 11/06/2017 9:09 AM

## 2017-11-12 ENCOUNTER — Ambulatory Visit (HOSPITAL_COMMUNITY)
Admission: RE | Admit: 2017-11-12 | Discharge: 2017-11-12 | Disposition: A | Discharge status: Home / Self Care | Payer: Medicare Other | Source: Ambulatory Visit | Attending: Cardiology | Admitting: Cardiology

## 2017-11-12 DIAGNOSIS — I428 Other cardiomyopathies: Secondary | ICD-10-CM | POA: Insufficient documentation

## 2017-11-12 DIAGNOSIS — I5022 Chronic systolic (congestive) heart failure: Secondary | ICD-10-CM

## 2017-11-12 LAB — BASIC METABOLIC PANEL
ANION GAP: 9 (ref 5–15)
BUN: 44 mg/dL — ABNORMAL HIGH (ref 6–20)
CALCIUM: 9 mg/dL (ref 8.9–10.3)
CO2: 25 mmol/L (ref 22–32)
Chloride: 107 mmol/L (ref 101–111)
Creatinine, Ser: 3.15 mg/dL — ABNORMAL HIGH (ref 0.61–1.24)
GFR calc Af Amer: 21 mL/min — ABNORMAL LOW (ref >60)
GFR calc non Af Amer: 18 mL/min — ABNORMAL LOW (ref >60)
GLUCOSE: 89 mg/dL (ref 65–99)
Potassium: 3.8 mmol/L (ref 3.5–5.1)
Sodium: 141 mmol/L (ref 135–145)

## 2017-11-13 ENCOUNTER — Other Ambulatory Visit (HOSPITAL_COMMUNITY): Payer: Self-pay | Admitting: *Deleted

## 2017-11-13 MED ORDER — CARVEDILOL 12.5 MG PO TABS: 18.7500 mg | ORAL_TABLET | Freq: Two times a day (BID) | ORAL | 3 refills | Status: DC

## 2017-11-19 ENCOUNTER — Ambulatory Visit (INDEPENDENT_AMBULATORY_CARE_PROVIDER_SITE_OTHER): Payer: Self-pay

## 2017-11-19 ENCOUNTER — Telehealth: Payer: Self-pay

## 2017-11-19 DIAGNOSIS — Z95810 Presence of automatic (implantable) cardiac defibrillator: Secondary | ICD-10-CM

## 2017-11-19 DIAGNOSIS — I5022 Chronic systolic (congestive) heart failure: Secondary | ICD-10-CM

## 2017-11-19 NOTE — Progress Notes (Signed)
EPIC Encounter for ICM Monitoring  Patient Name: Miguel Roach is a 77 y.o. male Date: 11/19/2017 Primary Care Physican: Deland Pretty, MD Primary Cardiologist:McLean Electrophysiologist: Lovena Le Nephrologist: [facility] - Patel Dry Weight:Prior weight 170lbs                                               Attempted call to patient and unable to reach.  Left detailed message regarding transmission.  Transmission reviewed.    Thoracic impedance abnormal suggesting fluid accumulation trending just below baseline.  Prescribed dosage: Furosemide 40 mg take 2 tablets (80 mg total) bymouth every morningand 1 tablet (40 mg total)everyevening.   Labs:  BMET to be drawn on 11/12/17 11/12/2017 Creatinine 3.15, BUN 44, Potassium 3.8, Sodium 141, EGFR 18-21 10/03/2017 Creatinine 2.99, BUN 34, Potassium 3.6, Sodium 136, EGFR 19-22 09/10/2017 Creatinine 3.24, BUN 49, Potassium 4.0, Sodium 141, EGFR 17-20 09/03/2017 Creatinine 3.77, BUN 26, Potassium 3.6, Sodium 140, EGFR 14-17 08/20/2017 Creatinine 3.00, BUN 36, Potassium 3.6, Sodium 139, EGFR 19-22 07/24/2017 Creatinine 3.65, BUN 72, Potassium 4.5, Sodium 143, EGFR 15 05/15/2017 Creatinine 3.12, BUN 42, Potassium 4.2, Sodium 137, EGFR 18-21 02/12/2017 Creatinine 3.39, BUN 55, Potassium 4.3, Sodium 139, EGFR 16-19 12/14/2016 Creatinine 3.17, BUN 4.1,Potassium 4.4, Sodium 139, EGFR 18-21 11/22/2016 Creatinine 2.96, BUN 39, Potassium 4.3, Sodium 142, EGFR 19-22  11/14/2016 Creatinine 3.05, BUN 43, Potassium 4.6, Sodium 141, EGFR 19-22  08/22/2017Creatinine 2.53, BUN 44, Potassium 4.3, Sodium 140  05/01/2016 Creatinine 2.60, BUN 32, Potassium 4.0, Sodium 141, EGFR 23-26  04/26/2016 Creatinine 2.85, BUN 34, Potassium 4.0, Sodium 141, EGFR 20-24  Recommendations: Left voice mail with ICM number and encouraged to call if experiencing any fluid symptoms.  Follow-up plan: ICM clinic phone appointment on 11/30/2017.   Copy of ICM  check sent to Dr. Lovena Le and Dr Aundra Dubin.   3 month ICM trend: 11/19/2017    1 Year ICM trend:       Rosalene Billings, RN 11/19/2017 12:35 PM

## 2017-11-19 NOTE — Telephone Encounter (Signed)
Remote ICM transmission received.  Attempted call to patient and left detailed message per DPR regarding transmission and next ICM scheduled for 11/30/2017.  Advised to return call for any fluid symptoms or questions.

## 2017-11-25 NOTE — Progress Notes (Signed)
Increase Lasix to 80 qam/40 qpm alternating with 80 mg bid.  BMET 1 week.

## 2017-11-26 ENCOUNTER — Other Ambulatory Visit: Payer: Self-pay

## 2017-11-26 DIAGNOSIS — I5022 Chronic systolic (congestive) heart failure: Secondary | ICD-10-CM

## 2017-11-26 MED ORDER — FUROSEMIDE 40 MG PO TABS
ORAL_TABLET | ORAL | 3 refills | Status: DC
Start: 2017-11-26 — End: 2017-11-26

## 2017-11-26 MED ORDER — FUROSEMIDE 40 MG PO TABS: ORAL_TABLET | ORAL | 3 refills | Status: DC

## 2017-11-26 NOTE — Addendum Note (Signed)
Addended by: Rosalene Billings on: 11/26/2017 10:34 AM   Modules accepted: Orders

## 2017-11-26 NOTE — Addendum Note (Signed)
Addended by: Rosalene Billings on: 11/26/2017 10:24 AM   Modules accepted: Orders

## 2017-11-26 NOTE — Progress Notes (Signed)
Call to patient. Advised Dr Aundra Dubin increased Furosemide to 40 mg 2 tablets (80 mg total) every AM and 1 tablet ( 40 mg total) every PM alternating with 2 tablets (80 mg tota) twice a day.  Will have BMET drawn in a week on 11/02/2017 at the [facility] office.  He would like the results to be faxed to Dr Serita Grit office at [facility] (he has an appointment with him on 2/22).  Patient verbalized understanding of Furosemide change.  He has enough supply on hand and does not need filled at this time. Patient's weight is 172.6 lbs today

## 2017-11-30 ENCOUNTER — Telehealth: Payer: Self-pay

## 2017-11-30 ENCOUNTER — Ambulatory Visit (INDEPENDENT_AMBULATORY_CARE_PROVIDER_SITE_OTHER): Payer: Medicare Other

## 2017-11-30 DIAGNOSIS — Z95810 Presence of automatic (implantable) cardiac defibrillator: Secondary | ICD-10-CM | POA: Diagnosis not present

## 2017-11-30 DIAGNOSIS — I5022 Chronic systolic (congestive) heart failure: Secondary | ICD-10-CM | POA: Diagnosis not present

## 2017-11-30 NOTE — Progress Notes (Signed)
EPIC Encounter for ICM Monitoring  Patient Name: Miguel Roach is a 77 y.o. male Date: 11/30/2017 Primary Care Physican: Pharr, Walter, MD Primary Cardiologist:McLean Electrophysiologist: Taylor Nephrologist: [facility] - Patel Dry Weight:Prior weight172.6lbs      Attempted call to patient and unable to reach.  Left detailed message regarding transmission.  Reminded patient to have labs drawn on 12/03/2017.  Transmission reviewed.    Thoracic impedance normal.  Prescribed dosage: Furosemide 40 mg take 2 tablets (80 mg total) bymouth every morningand 1 tablet (40 mg total)everyevening alternating every other day with 80 mg twice a day.  Labs:BMET to be drawn on 12/03/17 and patient requested to fax to Dr Patel 11/12/2017 Creatinine 3.15, BUN 44, Potassium 3.8, Sodium 141, EGFR 18-21 10/03/2017 Creatinine 2.99, BUN 34, Potassium 3.6, Sodium 136, EGFR 19-22 09/10/2017 Creatinine 3.24, BUN 49, Potassium 4.0, Sodium 141, EGFR 17-20 09/03/2017 Creatinine 3.77, BUN 26, Potassium 3.6, Sodium 140, EGFR 14-17 08/20/2017 Creatinine 3.00, BUN 36, Potassium 3.6, Sodium 139, EGFR 19-22 07/24/2017 Creatinine 3.65, BUN 72, Potassium 4.5, Sodium 143, EGFR 15 05/15/2017 Creatinine 3.12, BUN 42, Potassium 4.2, Sodium 137, EGFR 18-21 02/12/2017 Creatinine 3.39, BUN 55, Potassium 4.3, Sodium 139, EGFR 16-19 12/14/2016 Creatinine 3.17, BUN 4.1,Potassium 4.4, Sodium 139, EGFR 18-21 11/22/2016 Creatinine 2.96, BUN 39, Potassium 4.3, Sodium 142, EGFR 19-22  11/14/2016 Creatinine 3.05, BUN 43, Potassium 4.6, Sodium 141, EGFR 19-22  08/22/2017Creatinine 2.53, BUN 44, Potassium 4.3, Sodium 140  05/01/2016 Creatinine 2.60, BUN 32, Potassium 4.0, Sodium 141, EGFR 23-26  04/26/2016 Creatinine 2.85, BUN 34, Potassium 4.0, Sodium 141, EGFR 20-24  Recommendations: Left voice mail with ICM number and encouraged to call if experiencing any fluid symptoms.  Follow-up plan: ICM clinic phone  appointment on 12/31/2017.  Office appointment scheduled 01/11/2018 with Dr. Taylor.  Copy of ICM check sent to Dr. Taylor.   3 month ICM trend: 11/30/2017    1 Year ICM trend:       Laurie S Short, RN 11/30/2017 7:41 AM   

## 2017-11-30 NOTE — Telephone Encounter (Signed)
Call to [facility] office and spoke with Lattie Haw.  Advised will fax lab results on this patient next week for Dr Patel's review.  She provided fax number of [phone].  Address cover sheet to Dr Posey Pronto.

## 2017-11-30 NOTE — Progress Notes (Signed)
Call to [facility] office and spoke with Lattie Haw.  Advised will fax lab results on this patient next week for Dr Patel's review.  She provided fax number of [phone]

## 2017-12-03 ENCOUNTER — Other Ambulatory Visit: Payer: Medicare Other | Admitting: *Deleted

## 2017-12-03 DIAGNOSIS — I5022 Chronic systolic (congestive) heart failure: Secondary | ICD-10-CM

## 2017-12-04 LAB — BASIC METABOLIC PANEL
BUN / CREAT RATIO: 14 (ref 10–24)
BUN: 44 mg/dL — ABNORMAL HIGH (ref 8–27)
CO2: 22 mmol/L (ref 20–29)
Calcium: 9.4 mg/dL (ref 8.6–10.2)
Chloride: 105 mmol/L (ref 96–106)
Creatinine, Ser: 3.23 mg/dL — ABNORMAL HIGH (ref 0.76–1.27)
GFR, EST AFRICAN AMERICAN: 20 mL/min/{1.73_m2} — AB (ref >59)
GFR, EST NON AFRICAN AMERICAN: 18 mL/min/{1.73_m2} — AB (ref >59)
Glucose: 135 mg/dL — ABNORMAL HIGH (ref 65–99)
POTASSIUM: 4 mmol/L (ref 3.5–5.2)
Sodium: 145 mmol/L — ABNORMAL HIGH (ref 134–144)

## 2017-12-31 ENCOUNTER — Ambulatory Visit (INDEPENDENT_AMBULATORY_CARE_PROVIDER_SITE_OTHER): Payer: Medicare Other

## 2017-12-31 DIAGNOSIS — I5022 Chronic systolic (congestive) heart failure: Secondary | ICD-10-CM

## 2017-12-31 DIAGNOSIS — Z95810 Presence of automatic (implantable) cardiac defibrillator: Secondary | ICD-10-CM | POA: Diagnosis not present

## 2018-01-01 NOTE — Progress Notes (Signed)
EPIC Encounter for ICM Monitoring  Patient Name: Miguel Roach is a 77 y.o. male Date: 01/01/2018 Primary Care Physican: Deland Pretty, MD Primary [place] Electrophysiologist: Lovena Le Nephrologist: [facility] - Patel Dry Weight:169lbs      Heart Failure questions reviewed, pt asymptomatic.   Thoracic impedance normal.  Prescribed dosage: Furosemide 40 mg take 2 tablets (80 mg total) bymouth every morningand 1 tablet (40 mg total)everyevening alternating every other day with 80 mg twice a day.  Labs: 12/03/2017 Creatinine 3.23, BUN 44, Potassium 4.0, Sodium 145, EGFR 18-20 11/12/2017 Creatinine 3.15, BUN 44, Potassium 3.8, Sodium 141, EGFR 18-21 10/03/2017 Creatinine 2.99, BUN 34, Potassium 3.6, Sodium 136, EGFR 19-22 09/10/2017 Creatinine 3.24, BUN 49, Potassium 4.0, Sodium 141, EGFR 17-20 09/03/2017 Creatinine 3.77, BUN 26, Potassium 3.6, Sodium 140, EGFR 14-17 08/20/2017 Creatinine 3.00, BUN 36, Potassium 3.6, Sodium 139, EGFR 19-22 07/24/2017 Creatinine 3.65, BUN 72, Potassium 4.5, Sodium 143, EGFR 15 05/15/2017 Creatinine 3.12, BUN 42, Potassium 4.2, Sodium 137, EGFR 18-21 02/12/2017 Creatinine 3.39, BUN 55, Potassium 4.3, Sodium 139, EGFR 16-19 12/14/2016 Creatinine 3.17, BUN 4.1,Potassium 4.4, Sodium 139, EGFR 18-21 11/22/2016 Creatinine 2.96, BUN 39, Potassium 4.3, Sodium 142, EGFR 19-22  11/14/2016 Creatinine 3.05, BUN 43, Potassium 4.6, Sodium 141, EGFR 19-22  08/22/2017Creatinine 2.53, BUN 44, Potassium 4.3, Sodium 140  05/01/2016 Creatinine 2.60, BUN 32, Potassium 4.0, Sodium 141, EGFR 23-26  04/26/2016 Creatinine 2.85, BUN 34, Potassium 4.0, Sodium 141, EGFR 20-24  Recommendations: No changes.   Encouraged to call for fluid symptoms.  Follow-up plan: ICM clinic phone appointment on 02/11/2018.  Office appointment scheduled 01/01/2018 with Dr. Aundra Dubin and Dr Lovena Le 01/11/2018.  Copy of ICM check sent to Dr. Lovena Le.   3 month ICM trend:  01/01/2018    1 Year ICM trend:       Rosalene Billings, RN 01/01/2018 3:57 PM

## 2018-01-02 ENCOUNTER — Encounter: Payer: Self-pay | Admitting: Cardiology

## 2018-01-02 ENCOUNTER — Ambulatory Visit (HOSPITAL_COMMUNITY)
Admission: RE | Admit: 2018-01-02 | Discharge: 2018-01-02 | Disposition: A | Discharge status: Home / Self Care | Payer: Medicare Other | Source: Ambulatory Visit | Attending: Cardiology | Admitting: Cardiology

## 2018-01-02 ENCOUNTER — Encounter (HOSPITAL_COMMUNITY): Payer: Self-pay | Admitting: Cardiology

## 2018-01-02 VITALS — BP 122/69 | HR 60 | Wt 173.4 lb

## 2018-01-02 DIAGNOSIS — I5022 Chronic systolic (congestive) heart failure: Secondary | ICD-10-CM | POA: Diagnosis not present

## 2018-01-02 DIAGNOSIS — N184 Chronic kidney disease, stage 4 (severe): Secondary | ICD-10-CM | POA: Insufficient documentation

## 2018-01-02 DIAGNOSIS — Z79899 Other long term (current) drug therapy: Secondary | ICD-10-CM | POA: Insufficient documentation

## 2018-01-02 DIAGNOSIS — I429 Cardiomyopathy, unspecified: Secondary | ICD-10-CM | POA: Insufficient documentation

## 2018-01-02 DIAGNOSIS — M109 Gout, unspecified: Secondary | ICD-10-CM | POA: Diagnosis not present

## 2018-01-02 DIAGNOSIS — G4733 Obstructive sleep apnea (adult) (pediatric): Secondary | ICD-10-CM | POA: Diagnosis not present

## 2018-01-02 DIAGNOSIS — I480 Paroxysmal atrial fibrillation: Secondary | ICD-10-CM | POA: Diagnosis not present

## 2018-01-02 LAB — BASIC METABOLIC PANEL
ANION GAP: 9 (ref 5–15)
BUN: 42 mg/dL — AB (ref 6–20)
CALCIUM: 9 mg/dL (ref 8.9–10.3)
CO2: 24 mmol/L (ref 22–32)
CREATININE: 3.19 mg/dL — AB (ref 0.61–1.24)
Chloride: 107 mmol/L (ref 101–111)
GFR calc Af Amer: 20 mL/min — ABNORMAL LOW (ref >60)
GFR calc non Af Amer: 17 mL/min — ABNORMAL LOW (ref >60)
GLUCOSE: 104 mg/dL — AB (ref 65–99)
Potassium: 3.8 mmol/L (ref 3.5–5.1)
Sodium: 140 mmol/L (ref 135–145)

## 2018-01-02 LAB — DIGOXIN LEVEL: Digoxin Level: 0.4 ng/mL — ABNORMAL LOW (ref 0.8–2.0)

## 2018-01-02 MED ORDER — FUROSEMIDE 40 MG PO TABS: ORAL_TABLET | ORAL | 3 refills | Status: DC

## 2018-01-02 NOTE — Patient Instructions (Signed)
Labs drawn today (if we do not call you, then your lab work was stable)   Your physician has requested that you have an echocardiogram. Echocardiography is a painless test that uses sound waves to create images of your heart. It provides your doctor with information about the size and shape of your heart and how well your heart's chambers and valves are working. This procedure takes approximately one hour. There are no restrictions for this procedure.  Your physician recommends that you schedule a follow-up appointment in: 3 months with Dr. Aundra Dubin  an a echocardiogram

## 2018-01-02 NOTE — Progress Notes (Signed)
P  Advanced Heart Failure Clinic Note   Patient ID: Miguel Roach, male   DOB: 11-17-1940, 78 y.o.   MRN: [id number] PCP: Dr. Shelia Media Cardiology: Dr. Aundra Dubin   77 yo with history of chronic systolic CHF/nonischemic cardiomyopathy and CKD presents for followup of CHF. He actually had an echo back in 2010 with EF 40-45% at the time.  He says that this really was not followed up upon by a cardiologist and that he did well for a number of years after that. In 6/16, he was admitted with acute systolic CHF.  Echo showed EF down to 20-25%.  Cardiac cath showed nonobstructive CAD.  His Lasix has been gradually increased.  This has brought his weight down and improved his breathing, but creatinine has risen. He had been on olmesartan but this was stopped because he thought it was causing cough.  He was then put on losartan, but this was stopped with rise in creatinine. Also intolerant to Bidil with orthostatic symptoms.  He was started on digoxin.  Echo in 12/16 showed persistently low EF, 15-20%.  Medtronic ICD was placed in 2/17.  Repeat echo 1/18 showed EF 25% with diffuse hypokinesis.   In 3/18, he had an episode of about 10 minutes atrial fibrillation noted on his ICD.  He also had 1 episode of VT terminated by an appropriate discharge.  He had push-mowed his grass and was very fatigued when he had the shock.  Since then, he has had no further arrhythmias.   He returns today for HF follow up. Overall feeling great. Walking 1 mile at the [facility] 4x/week with no SOB or fatigue. No orthopnea/PND/edema. No CP or dizziness. No extra lasix. Weights 170-174 lbs. Compliant with medications. Working on limiting salt and fluid. Wife usually cooks. Had a ham hock last week.  Medtronic device interrogation: fluid index < threshold with stable thoracic impedance.  Short (< 10 minute) run of possible atrial fibrillation x 2.   Labs (6/16): BNP 1954, TSH normal Labs (7/16): K 4.1, creatinine 1.72 Labs (8/16): creatinine  2.3 Labs (9/16): digoxin 0.3, K 4, creatinine 2.14, SPEP negative, UPEP negative Labs (10/16): K 4.6, creatinine 1.62 Labs (11/16): K 4.4, creatinine 2.33, digoxin 0.5 Labs (12/16): creatinine 2.1 (nephrology) Labs (1/17): digoxin 0.6, uric acid 10.4 Labs (2/17): K 4, creatinine 2.13, hgb 11.6 Labs (5/17): K 3.8, creatinine 2.22, digoxin 0.7 Labs (9/17): K 4.4, creatinine 2.88, LFTs normal, digoxin 1.0, hgb 11.8, LDL 88, HDL 50 Labs (1/18): digoxin 0.5 Labs (3/18): K 4.4, creatinine 3.17 Labs (4/18): K 4.3, creatinine 3.39, digoxin 0.6 Labs (10/18): K 4.5, creatinine 3.65, digoxin 0.4 Labs (11/18): K 4, creatinine 3.24, digoxin 0.6 Labs (12/18): digoxin < 0.2 Labs (1/19): K 3.8, creatinine 3.15 Labs (2/19): K 4.0, creatinine 3.23  PMH: 1. CKD: Stage IV.  Follows with Dr Posey Pronto.  2. Gout 3. Chronic systolic CHF: Nonischemic cardiomyopathy.  EF 40-45% in 2010.  Echo (6/16) with EF 20-25%, severe LV dilation, mild MR, mild AI, moderate LAE.  LHC/RHC (6/16) with nonobstructive CAD; mean RA 2, RV 48/1, PA 47/18, mean PCWP 12, CI 3.2.  CPX (10/16) with peak VO2 13.1 (50% predicted), VE/VCO2 slope 39.3, RER 1.22 => moderate to severe functional limitation.  Echo (12/16) with EF 15-20%, severe LV dilation, diffuse hypokinesis, moderate AI, mild MR, normal RV size and systolic function. Medtronic ICD.  - CPX (11/17): Submaximal with RER 0.95, VE/VCO2 slope 27, peak VO2 18.2.  Suspect no more than mild HF limitation.  -  Echo (1/18): EF 25%, diffuse hypokinesis, normal RV size and systolic function, mild AI.  4. ACEI cough.  5. Pleural effusion: Thoracentesis on right in 8/16.  It was a transudate, likely due to CHF.  6. OSA: To start Bipap.  7. Esophageal duplication cyst.  8. High resolution CT chest 12/17: No evidence for interstitial lung disease.  9. Atrial fibrillation: Paroxysmal, 1 10 minute episode in 3/18.  10. VT: ICD discharge 3/18.   SH: Married, retired Pharmacist, hospital, never smoked, no  ETOH.   FH: Mother with "weak heart." Brother with sickle cell anemia.   ROS: All systems reviewed and negative except as per HPI.   Current Outpatient Medications  Medication Sig Dispense Refill  . allopurinol (ZYLOPRIM) 100 MG tablet Take 2 tablets (200 mg total) by mouth daily. 180 tablet 3  . carvedilol (COREG) 12.5 MG tablet Take 1.5 tablets (18.75 mg total) by mouth 2 (two) times daily. 270 tablet 3  . colchicine 0.6 MG tablet Take 0.6 mg by mouth as needed (take 1 tablet every monday wednesday and friday).    Marland Kitchen digoxin (LANOXIN) 0.125 MG tablet Take 0.5 tablets (0.0625 mg total) by mouth every other day. 15 tablet 3  . furosemide (LASIX) 40 MG tablet Take 2 tabs (80 mg total) by mouth every morning and 1 tab (40 mg total) every evening alternating with 2 tabs (80 mg total) twice a day. 315 tablet 3  . hydrALAZINE (APRESOLINE) 25 MG tablet Take 1.5 tablets (37.5 mg total) by mouth 3 (three) times daily. (Patient taking differently: Take 25 mg 3 (three) times daily by mouth. ) 405 tablet 3  . isosorbide mononitrate (IMDUR) 30 MG 24 hr tablet TAKE 1 TABLET (30 MG TOTAL) BY MOUTH DAILY. 30 tablet 6   No current facility-administered medications for this encounter.    Filed Weights   01/02/18 1056  Weight: 173 lb 6.4 oz (78.7 kg)   BP 122/69   Pulse 60   Wt 173 lb 6.4 oz (78.7 kg)   SpO2 94%   BMI 24.18 kg/m   General: Well appearing. No resp difficulty. HEENT: Normal Neck: Supple. No JVD. Carotids 2+ bilat; no bruits. No thyromegaly or nodule noted. Cor: PMI nondisplaced. RRR, No M/G/R noted Lungs: CTAB, normal effort. Abdomen: Soft, non-tender, non-distended, no HSM. No bruits or masses. +BS  Extremities: No cyanosis, clubbing, or rash. R and LLE no edema.  Neuro: Alert & orientedx3, cranial nerves grossly intact. moves all 4 extremities w/o difficulty. Affect pleasant  Assessment/Plan: 1. Chronic systolic CHF: Nonischemic cardiomyopathy.  CO preserved on RHC in 6/16, but  10/16 CPX showed moderate to severe functional impairment (seems worse than his symptoms would suggest).  However, repeat CPX in 11/17, though submaximal, suggested no more than mild HF limitation. No significant CAD with coronary angiography in 6/16.  No hx heavy ETOH or drug abuse. No marked HTN.  Negative SPEP/UPEP.  Possible prior myocarditis.  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  He now has Medtronic ICD.  Last echo in 1/18 with EF 25% with normal RV size and systolic function.  NYHA class II.  Volume status stable today by exam and Optivol.  - Continue alternating Lasix 80 mg bid with Lasix 80 qam/40 qpm. BMET today - Off spironolactone with elevated creatinine.    - Continue Coreg 12.5 mg bid.  - Hold off on ACEI/ARB/Entresto at this time with elevated creatinine.  - Continue hydralazine 25 mg tid and Imdur 30 mg  daily, he did not tolerate increase.   - Continue digoxin 0.0625 every other day, check dig level today. 2. CKD: Stage IV.  Creatinine has been slowly up-trending.  BMET today.  He follows with Dr Posey Pronto every 3 months 3. OSA: Bipap. 4. Gout: Gout controlled on allopurinol. 5. VT: Appropriate ICD discharge in 3/18, no recurrence.  - Continue coreg - Hold off on anti-arrhythmic unless VT recurs.  6. Atrial fibrillation: Paroxysmal.  Very short episodes only noted by device interrogation.  Dr. Aundra Dubin has discussed the threshold for anticoagulation with Dr. Caryl Comes (asymptomatic, found only by device monitoring).  He has had no prolonged or symptomatic atrial fibrillation, so will hold off on anticoagulation.  - No longer taking ASA.  - If he develops prolonged or symptomatic atrial fibrillation, would anticoagulate.   F/u 3 months with echo BMET, dig level   Miguel Roach 01/02/2018   Patient seen with NP, agree with the above note.  He is symptomatically stable, has good exercise tolerance.  Medtronic device interrogation and exam both suggest  stable volume status.    CKD stage IV has limited meds.  Will continue current Coreg and hydralazine/nitrates.  Will continue digoxin but check level today.  Will need to continue to follow level closely.   He had 2 very short runs of possible atrial fibrillation (< 10 min) noted on device interrogation. Per my discussion with Dr. Caryl Comes, would not anticoagulate unless the patient has more prolonged episodes or symptomatic episodes.   Followup in 3 months with echo.   [name] 01/02/2018

## 2018-01-11 ENCOUNTER — Encounter: Payer: Self-pay | Admitting: Internal Medicine

## 2018-01-11 ENCOUNTER — Ambulatory Visit: Payer: Medicare Other | Admitting: Internal Medicine

## 2018-01-11 VITALS — BP 126/70 | HR 64 | Ht 71.0 in | Wt 174.0 lb

## 2018-01-11 DIAGNOSIS — I480 Paroxysmal atrial fibrillation: Secondary | ICD-10-CM | POA: Diagnosis not present

## 2018-01-11 DIAGNOSIS — I5022 Chronic systolic (congestive) heart failure: Secondary | ICD-10-CM

## 2018-01-11 DIAGNOSIS — I472 Ventricular tachycardia, unspecified: Secondary | ICD-10-CM

## 2018-01-11 DIAGNOSIS — Z95810 Presence of automatic (implantable) cardiac defibrillator: Secondary | ICD-10-CM | POA: Diagnosis not present

## 2018-01-11 LAB — CUP PACEART INCLINIC DEVICE CHECK
Battery Voltage: 3.01 V
Brady Statistic RV Percent Paced: 0.02 %
Date Time Interrogation Session: 20190329093507
HighPow Impedance: 60 Ohm
Implantable Lead Location: 753860
Implantable Lead Model: 6935
Lead Channel Impedance Value: 418 Ohm
Lead Channel Sensing Intrinsic Amplitude: 9.5 mV
Lead Channel Setting Pacing Amplitude: 2 V
Lead Channel Setting Sensing Sensitivity: 0.3 mV
MDC IDC LEAD IMPLANT DT: 20170208
MDC IDC MSMT BATTERY REMAINING LONGEVITY: 124 mo
MDC IDC MSMT LEADCHNL RV IMPEDANCE VALUE: 304 Ohm
MDC IDC MSMT LEADCHNL RV PACING THRESHOLD AMPLITUDE: 1 V
MDC IDC MSMT LEADCHNL RV PACING THRESHOLD PULSEWIDTH: 0.4 ms
MDC IDC MSMT LEADCHNL RV SENSING INTR AMPL: 10.625 mV
MDC IDC PG IMPLANT DT: 20170208
MDC IDC SET LEADCHNL RV PACING PULSEWIDTH: 0.4 ms

## 2018-01-11 NOTE — Patient Instructions (Signed)
Medication Instructions:  Your physician recommends that you continue on your current medications as directed. Please refer to the Current Medication list given to you today.  Labwork: None ordered.  Testing/Procedures: None ordered.  Follow-Up: Your physician wants you to follow-up in: one year with Dr. Lovena Le.   You will receive a reminder letter in the mail two months in advance. If you don't receive a letter, please call our office to schedule the follow-up appointment.  Remote monitoring is used to monitor your ICD from home. This monitoring reduces the number of office visits required to check your device to one time per year. It allows [place] to keep an eye on the functioning of your device to ensure it is working properly. You are scheduled for a device check from home on 02/11/2018. You may send your transmission at any time that day. If you have a wireless device, the transmission will be sent automatically. After your physician reviews your transmission, you will receive a postcard with your next transmission date.  Any Other Special Instructions Will Be Listed Below (If Applicable).  If you need a refill on your cardiac medications before your next appointment, please call your pharmacy.

## 2018-01-11 NOTE — Progress Notes (Signed)
HPI [name] is returns today for ongoing followup of his ICD. He is a pleasant 77 yo man who has a h/o LV dysfunction dating back 6-7 years. His EF is 30%. He is on maximal medical therapy. Since his ICD was placed he has done well. He is traveling but does admit to dietary indiscretion. He has graduated from cardiac rehab and remains active. He has a past h/o VT with successful ICD therapy. He feels well otherwise.  Allergies  Allergen Reactions  . Benicar [Olmesartan] Cough     Current Outpatient Medications  Medication Sig Dispense Refill  . allopurinol (ZYLOPRIM) 100 MG tablet Take 2 tablets (200 mg total) by mouth daily. 180 tablet 3  . carvedilol (COREG) 12.5 MG tablet Take 12.5 mg by mouth 2 (two) times daily with a meal.    . digoxin (LANOXIN) 0.125 MG tablet Take 0.5 tablets (0.0625 mg total) by mouth every other day. 15 tablet 3  . furosemide (LASIX) 40 MG tablet Take 2 tabs (80 mg total) by mouth every morning and 1 tab (40 mg total) every evening alternating with 2 tabs (80 mg total) twice a day. 315 tablet 3  . hydrALAZINE (APRESOLINE) 25 MG tablet Take 25 mg by mouth 3 (three) times daily.    . isosorbide mononitrate (IMDUR) 30 MG 24 hr tablet TAKE 1 TABLET (30 MG TOTAL) BY MOUTH DAILY. 30 tablet 6   No current facility-administered medications for this visit.      Past Medical History:  Diagnosis Date  . Adenomatous colon polyp 2011  . AICD (automatic cardioverter/defibrillator) present   . Anal fistula 2003  . Arthritis   . CHF (congestive heart failure) ([place])   . CRI (chronic renal insufficiency)    creatinine back to normal  . Diverticulosis   . ED (erectile dysfunction)   . Gout   . Headache   . Hypertension   . Internal hemorrhoids   . Liver lesion 2007  . Nonischemic cardiomyopathy ([place])    EF 20-25% by echo 2016, At that time 40% RCA stenosis, distal 35% circumflex stenosis  . Renal cyst 2007   "I'm not familiar with that, but I follow up on  my creatinine because my kidney function is low." 09/2016  . Sleep apnea    "had test; never followed thru w/getting mask" (11/25/2015)    ROS:   All systems reviewed and negative except as noted in the HPI.   Past Surgical History:  Procedure Laterality Date  . ANAL FISTULOTOMY  01/2002  . CARDIAC CATHETERIZATION N/A 04/16/2015   Procedure: Right/Left Heart Cath and Coronary Angiography;  Surgeon: Belva Crome, MD;  Location: [facility];  Service: Cardiovascular;  Laterality: N/A;  . EP IMPLANTABLE DEVICE N/A 11/24/2015   Procedure: ICD Implant;  Surgeon: Evans Lance, MD;  Location: [facility];  Service: Cardiovascular;  Laterality: N/A;  . ESOPHAGOGASTRODUODENOSCOPY (EGD) WITH PROPOFOL N/A 10/02/2016   Procedure: ESOPHAGOGASTRODUODENOSCOPY (EGD) WITH PROPOFOL;  Surgeon: Ladene Artist, MD;  Location: [facility] END[facility]COPY;  Service: Endoscopy;  Laterality: N/A;  . EUS N/A 10/19/2016   Procedure: UPPER ENDOSCOPIC ULTRASOUND (EUS) RADIAL;  Surgeon: Milus Banister, MD;  Location: [facility] ENDOSCOPY;  Service: Endoscopy;  Laterality: N/A;  . INSERTION OF ICD  11/24/2015  . TONSILLECTOMY  ~ 1950     Family History  Problem Relation Age of Onset  . Heart failure Mother 60  . Sickle cell anemia Brother   .  Breast cancer Sister   . Diabetes Sister   . Diabetes Father        died at age 9  . Diabetes Sister   . Colon polyps Brother   . Clotting disorder Unknown        two daughters (inherited from his first wife)     Social History   Socioeconomic History  . Marital status: Married    Spouse name: [name]  . Number of children: 4  . Years of education: Not on file  . Highest education level: Not on file  Occupational History  . Occupation: Retired Environmental consultant  . Occupation: Retired Animal nutritionist  Social Needs  . Financial resource strain: Not on file  . Food insecurity:    Worry: Not on file    Inability: Not on file  . Transportation needs:    Medical: Not on  file    Non-medical: Not on file  Tobacco Use  . Smoking status: Never Smoker  . Smokeless tobacco: Never Used  . Tobacco comment: "smoked briefly in my freshman year in college; purchased 1  pack of cigarettes; didn't finish that"  Substance and Sexual Activity  . Alcohol use: No    Alcohol/week: 0.0 oz    Comment: 11/25/2015 "used to drink a beer q now in then back in my 20s"  . Drug use: No  . Sexual activity: Not Currently  Lifestyle  . Physical activity:    Days per week: Not on file    Minutes per session: Not on file  . Stress: Not on file  Relationships  . Social connections:    Talks on phone: Not on file    Gets together: Not on file    Attends religious service: Not on file    Active member of club or organization: Not on file    Attends meetings of clubs or organizations: Not on file    Relationship status: Not on file  . Intimate partner violence:    Fear of current or ex partner: Not on file    Emotionally abused: Not on file    Physically abused: Not on file    Forced sexual activity: Not on file  Other Topics Concern  . Not on file  Social History Narrative  . Not on file     BP 126/70   Pulse 64   Ht 5\' 11"  (1.803 m)   Wt 174 lb (78.9 kg)   SpO2 98%   BMI 24.27 kg/m   Physical Exam:  Well appearing 77 yo man, NAD HEENT: Unremarkable Neck:  6 cm JVD, no thyromegally Lymphatics:  No adenopathy Back:  No CVA tenderness Lungs:  Clear with no wheezes HEART:  Regular rate rhythm, no murmurs, no rubs, no clicks Abd:  soft, positive bowel sounds, no organomegally, no rebound, no guarding Ext:  2 plus pulses, no edema, no cyanosis, no clubbing Skin:  No rashes no nodules Neuro:  CN II through XII intact, motor grossly intact   DEVICE  Normal device function.  See PaceArt for details.   Assess/Plan: 1. Chronic systolic heart failure - his symptoms are class 2. He will continue his current meds and activity. He is encouraged to maintain a low sodium  diet. 2. PAF - his device suggests he may have had up to 10 minutes of atrial fib. I will follow this. If we see atrial fib for over an hour, would consider systemic anti-coagulation. 3. ICD - his Medtronic device is working normally.  Will recheck in several months. 4. VT - he has had no recurrent VT since his last visit.   [name].D.

## 2018-02-11 ENCOUNTER — Ambulatory Visit (INDEPENDENT_AMBULATORY_CARE_PROVIDER_SITE_OTHER): Payer: Medicare Other | Admitting: *Deleted

## 2018-02-11 DIAGNOSIS — I5022 Chronic systolic (congestive) heart failure: Secondary | ICD-10-CM

## 2018-02-11 DIAGNOSIS — I472 Ventricular tachycardia, unspecified: Secondary | ICD-10-CM

## 2018-02-11 DIAGNOSIS — Z95810 Presence of automatic (implantable) cardiac defibrillator: Secondary | ICD-10-CM | POA: Diagnosis not present

## 2018-02-11 NOTE — Progress Notes (Signed)
Remote ICD transmission.   

## 2018-02-12 ENCOUNTER — Telehealth: Payer: Self-pay

## 2018-02-12 ENCOUNTER — Encounter: Payer: Self-pay | Admitting: Cardiology

## 2018-02-12 DIAGNOSIS — Z95810 Presence of automatic (implantable) cardiac defibrillator: Secondary | ICD-10-CM

## 2018-02-12 DIAGNOSIS — I5022 Chronic systolic (congestive) heart failure: Secondary | ICD-10-CM

## 2018-02-12 DIAGNOSIS — I472 Ventricular tachycardia: Secondary | ICD-10-CM | POA: Diagnosis not present

## 2018-02-12 NOTE — Telephone Encounter (Signed)
Remote ICM transmission received.  Attempted call to patient and left detailed message per DPR regarding transmission and next ICM scheduled for 03/14/2018.  Advised to return call for any fluid symptoms or questions.

## 2018-02-12 NOTE — Progress Notes (Signed)
EPIC Encounter for ICM Monitoring  Patient Name: Miguel Roach is a 77 y.o. male Date: 02/12/2018 Primary Care Physican: Deland Pretty, MD Primary Cardiologist:[place] Electrophysiologist: Lovena Le Nephrologist: Narda Amber Kidney - Patel Dry Weight:Previous weight 169lbs       Attempted call to patient and unable to reach.  Left detailed message regarding transmission.  Transmission reviewed.    Thoracic impedance normal.  Prescribed dosage: Furosemide 40 mg take 2 tablets (80 mg total) bymouth every morningand 1 tablet (40 mg total)everyeveningalternating every other day with 80 mg twice a day.  Labs: 01/02/2018 Creatinine 3.19, BUN 42, Potassium 3.8, Sodium 140, EGFR 17-20 12/03/2017 Creatinine 3.23, BUN 44, Potassium 4.0, Sodium 145, EGFR 18-20 11/12/2017 Creatinine 3.15, BUN 44, Potassium 3.8, Sodium 141, EGFR 18-21 10/03/2017 Creatinine 2.99, BUN 34, Potassium 3.6, Sodium 136, EGFR 19-22 09/10/2017 Creatinine 3.24, BUN 49, Potassium 4.0, Sodium 141, EGFR 17-20 09/03/2017 Creatinine 3.77, BUN 26, Potassium 3.6, Sodium 140, EGFR 14-17 08/20/2017 Creatinine 3.00, BUN 36, Potassium 3.6, Sodium 139, EGFR 19-22 07/24/2017 Creatinine 3.65, BUN 72, Potassium 4.5, Sodium 143, EGFR 15 05/15/2017 Creatinine 3.12, BUN 42, Potassium 4.2, Sodium 137, EGFR 18-21 02/12/2017 Creatinine 3.39, BUN 55, Potassium 4.3, Sodium 139, EGFR 16-19 12/14/2016 Creatinine 3.17, BUN 4.1,Potassium 4.4, Sodium 139, EGFR 18-21 11/22/2016 Creatinine 2.96, BUN 39, Potassium 4.3, Sodium 142, EGFR 19-22   Recommendations: Left voice mail with ICM number and encouraged to call if experiencing any fluid symptoms.  Follow-up plan: ICM clinic phone appointment on 03/14/2018.    Copy of ICM check sent to Dr. Lovena Le.   3 month ICM trend: 02/11/2018    1 Year ICM trend:       Rosalene Billings, RN 02/12/2018 3:01 PM

## 2018-02-13 LAB — CUP PACEART REMOTE DEVICE CHECK
Battery Remaining Longevity: 123 mo
Battery Voltage: 3.02 V
HIGH POWER IMPEDANCE MEASURED VALUE: 63 Ohm
Implantable Lead Implant Date: 20170208
Implantable Lead Location: 753860
Implantable Lead Model: 6935
Implantable Pulse Generator Implant Date: 20170208
Lead Channel Pacing Threshold Amplitude: 1.125 V
Lead Channel Pacing Threshold Pulse Width: 0.4 ms
Lead Channel Setting Pacing Pulse Width: 0.4 ms
Lead Channel Setting Sensing Sensitivity: 0.3 mV
MDC IDC MSMT LEADCHNL RV IMPEDANCE VALUE: 304 Ohm
MDC IDC MSMT LEADCHNL RV IMPEDANCE VALUE: 361 Ohm
MDC IDC MSMT LEADCHNL RV SENSING INTR AMPL: 9.25 mV
MDC IDC MSMT LEADCHNL RV SENSING INTR AMPL: 9.25 mV
MDC IDC SESS DTM: 20190429084223
MDC IDC SET LEADCHNL RV PACING AMPLITUDE: 2.25 V
MDC IDC STAT BRADY RV PERCENT PACED: 0.07 %

## 2018-02-25 ENCOUNTER — Other Ambulatory Visit (HOSPITAL_COMMUNITY): Payer: Self-pay | Admitting: Cardiology

## 2018-03-14 ENCOUNTER — Ambulatory Visit (INDEPENDENT_AMBULATORY_CARE_PROVIDER_SITE_OTHER): Payer: Medicare Other

## 2018-03-14 DIAGNOSIS — Z95810 Presence of automatic (implantable) cardiac defibrillator: Secondary | ICD-10-CM

## 2018-03-14 DIAGNOSIS — I5022 Chronic systolic (congestive) heart failure: Secondary | ICD-10-CM

## 2018-03-14 NOTE — Progress Notes (Signed)
EPIC Encounter for ICM Monitoring  Patient Name: Miguel Roach is a 77 y.o. male Date: 03/14/2018 Primary Care Physican: Deland Pretty, MD Primary Cardiologist:[place] Electrophysiologist: Lovena Le Nephrologist: Narda Amber Kidney - Patel Dry Weight:Previous weight 169lbs      Attempted call to patient and unable to reach.  Left detailed message, per DPR, regarding transmission.  Transmission reviewed.    Thoracic impedance normal.  Prescribed dosage: Furosemide 40 mg take 2 tablets (80 mg total) bymouth every morningand 1 tablet (40 mg total)everyeveningalternating every other day with 80 mg twice a day.  Labs: 01/02/2018 Creatinine 3.19, BUN 42, Potassium 3.8, Sodium 140, EGFR 17-20 12/03/2017 Creatinine 3.23, BUN 44, Potassium 4.0, Sodium 145, EGFR 18-20 11/12/2017 Creatinine 3.15, BUN 44, Potassium 3.8, Sodium 141, EGFR 18-21 10/03/2017 Creatinine 2.99, BUN 34, Potassium 3.6, Sodium 136, EGFR 19-22 09/10/2017 Creatinine 3.24, BUN 49, Potassium 4.0, Sodium 141, EGFR 17-20 09/03/2017 Creatinine 3.77, BUN 26, Potassium 3.6, Sodium 140, EGFR 14-17 08/20/2017 Creatinine 3.00, BUN 36, Potassium 3.6, Sodium 139, EGFR 19-22 07/24/2017 Creatinine 3.65, BUN 72, Potassium 4.5, Sodium 143, EGFR 15 05/15/2017 Creatinine 3.12, BUN 42, Potassium 4.2, Sodium 137, EGFR 18-21 02/12/2017 Creatinine 3.39, BUN 55, Potassium 4.3, Sodium 139, EGFR 16-19 12/14/2016 Creatinine 3.17, BUN 4.1,Potassium 4.4, Sodium 139, EGFR 18-21 11/22/2016 Creatinine 2.96, BUN 39, Potassium 4.3, Sodium 142, EGFR 19-22   Recommendations: Left voice mail with ICM number and encouraged to call if experiencing any fluid symptoms.  Follow-up plan: ICM clinic phone appointment on 04/15/2018.  Office appointment scheduled 04/01/2018 with Dr. Aundra Dubin.  Copy of ICM check sent to Dr. Lovena Le.   3 month ICM trend: 03/14/2018    1 Year ICM trend:       Rosalene Billings, RN 03/14/2018 10:39 AM

## 2018-03-15 ENCOUNTER — Telehealth: Payer: Self-pay

## 2018-03-15 NOTE — Progress Notes (Signed)
Patient left voice mail message requesting date change for next remote because he will be out of town until 04/17/2018. Rescheduled for 04/22/2018

## 2018-03-15 NOTE — Telephone Encounter (Signed)
Remote ICM transmission received.  Attempted call to patient and left detailed message, per DPR, regarding transmission and next ICM scheduled for 04/15/2018.  Advised to return call for any fluid symptoms or questions.

## 2018-04-01 ENCOUNTER — Other Ambulatory Visit: Payer: Self-pay

## 2018-04-01 ENCOUNTER — Ambulatory Visit (HOSPITAL_BASED_OUTPATIENT_CLINIC_OR_DEPARTMENT_OTHER)
Admission: RE | Admit: 2018-04-01 | Discharge: 2018-04-01 | Disposition: A | Discharge status: Home / Self Care | Payer: Medicare Other | Source: Ambulatory Visit | Attending: Cardiology | Admitting: Cardiology

## 2018-04-01 ENCOUNTER — Encounter (HOSPITAL_COMMUNITY): Payer: Medicare Other | Admitting: Cardiology

## 2018-04-01 ENCOUNTER — Ambulatory Visit (HOSPITAL_COMMUNITY)
Admission: RE | Admit: 2018-04-01 | Discharge: 2018-04-01 | Disposition: A | Discharge status: Home / Self Care | Payer: Medicare Other | Source: Ambulatory Visit | Attending: Cardiology | Admitting: Cardiology

## 2018-04-01 VITALS — BP 120/69 | HR 69 | Wt 173.8 lb

## 2018-04-01 DIAGNOSIS — I5022 Chronic systolic (congestive) heart failure: Secondary | ICD-10-CM | POA: Insufficient documentation

## 2018-04-01 DIAGNOSIS — N184 Chronic kidney disease, stage 4 (severe): Secondary | ICD-10-CM | POA: Diagnosis not present

## 2018-04-01 DIAGNOSIS — I351 Nonrheumatic aortic (valve) insufficiency: Secondary | ICD-10-CM | POA: Diagnosis not present

## 2018-04-01 DIAGNOSIS — M109 Gout, unspecified: Secondary | ICD-10-CM | POA: Insufficient documentation

## 2018-04-01 DIAGNOSIS — I480 Paroxysmal atrial fibrillation: Secondary | ICD-10-CM

## 2018-04-01 DIAGNOSIS — I130 Hypertensive heart and chronic kidney disease with heart failure and stage 1 through stage 4 chronic kidney disease, or unspecified chronic kidney disease: Secondary | ICD-10-CM | POA: Diagnosis not present

## 2018-04-01 LAB — BASIC METABOLIC PANEL
ANION GAP: 8 (ref 5–15)
BUN: 42 mg/dL — AB (ref 6–20)
CALCIUM: 9.2 mg/dL (ref 8.9–10.3)
CO2: 29 mmol/L (ref 22–32)
CREATININE: 3.12 mg/dL — AB (ref 0.61–1.24)
Chloride: 107 mmol/L (ref 101–111)
GFR calc Af Amer: 21 mL/min — ABNORMAL LOW (ref >60)
GFR, EST NON AFRICAN AMERICAN: 18 mL/min — AB (ref >60)
GLUCOSE: 98 mg/dL (ref 65–99)
Potassium: 3.9 mmol/L (ref 3.5–5.1)
Sodium: 144 mmol/L (ref 135–145)

## 2018-04-01 LAB — DIGOXIN LEVEL: Digoxin Level: 0.3 ng/mL — ABNORMAL LOW (ref 0.8–2.0)

## 2018-04-01 MED ORDER — CARVEDILOL 6.25 MG PO TABS: 18.7500 mg | ORAL_TABLET | Freq: Two times a day (BID) | ORAL | 0 refills | Status: DC

## 2018-04-01 NOTE — Progress Notes (Signed)
P  Advanced Heart Failure Clinic Note   Patient ID: Miguel Roach, male   DOB: Mar 09, 1941, 77 y.o.   MRN: [id number] PCP: Dr. Shelia Media Cardiology: Dr. Aundra Dubin   77 y.o. with history of chronic systolic CHF/nonischemic cardiomyopathy and CKD presents for followup of CHF. He actually had an echo back in 2010 with EF 40-45% at the time.  He says that this really was not followed up upon by a cardiologist and that he did well for a number of years after that. In 6/16, he was admitted with acute systolic CHF.  Echo showed EF down to 20-25%.  Cardiac cath showed nonobstructive CAD.  His Lasix has been gradually increased.  This has brought his weight down and improved his breathing, but creatinine has risen. He had been on olmesartan but this was stopped because he thought it was causing cough.  He was then put on losartan, but this was stopped with rise in creatinine. Also intolerant to Bidil with orthostatic symptoms.  He was started on digoxin.  Echo in 12/16 showed persistently low EF, 15-20%.  Medtronic ICD was placed in 2/17.  Repeat echo 1/18 showed EF 25% with diffuse hypokinesis.   In 3/18, he had an episode of about 10 minutes atrial fibrillation noted on his ICD.  He also had 1 episode of VT terminated by an appropriate discharge.  He had push-mowed his grass and was very fatigued when he had the shock.  Since then, he has had no further arrhythmias.   Echo was done today and reviewed, EF 25-30% with mild LV dilation and mild-moderate AI, normal RV size with mildly decreased systolic function, IVC normal.   He returns today for HF follow up. He is doing well overall.  No significant exertional dyspnea.  He is able to weed-eat in the yard without problems.  Walks on the track at the [facility] 4 times/week. No orthopnea/PND. No chest pain.  No lightheadedness.   Medtronic device interrogation: fluid index < threshold with stable thoracic impedance.  He had a 40 minute run of atrial fibrillation one day in  May.   Labs (6/16): BNP 1954, TSH normal Labs (7/16): K 4.1, creatinine 1.72 Labs (8/16): creatinine 2.3 Labs (9/16): digoxin 0.3, K 4, creatinine 2.14, SPEP negative, UPEP negative Labs (10/16): K 4.6, creatinine 1.62 Labs (11/16): K 4.4, creatinine 2.33, digoxin 0.5 Labs (12/16): creatinine 2.1 (nephrology) Labs (1/17): digoxin 0.6, uric acid 10.4 Labs (2/17): K 4, creatinine 2.13, hgb 11.6 Labs (5/17): K 3.8, creatinine 2.22, digoxin 0.7 Labs (9/17): K 4.4, creatinine 2.88, LFTs normal, digoxin 1.0, hgb 11.8, LDL 88, HDL 50 Labs (1/18): digoxin 0.5 Labs (3/18): K 4.4, creatinine 3.17 Labs (4/18): K 4.3, creatinine 3.39, digoxin 0.6 Labs (10/18): K 4.5, creatinine 3.65, digoxin 0.4 Labs (11/18): K 4, creatinine 3.24, digoxin 0.6 Labs (12/18): digoxin < 0.2 Labs (1/19): K 3.8, creatinine 3.15 Labs (2/19): K 4.0, creatinine 3.23 Labs (3/19): digoxin 0.4, K 3.8, creatinine 3.19  PMH: 1. CKD: Stage IV.  Follows with Dr Posey Pronto.  2. Gout 3. Chronic systolic CHF: Nonischemic cardiomyopathy.  EF 40-45% in 2010.  Echo (6/16) with EF 20-25%, severe LV dilation, mild MR, mild AI, moderate LAE.  LHC/RHC (6/16) with nonobstructive CAD; mean RA 2, RV 48/1, PA 47/18, mean PCWP 12, CI 3.2.  CPX (10/16) with peak VO2 13.1 (50% predicted), VE/VCO2 slope 39.3, RER 1.22 => moderate to severe functional limitation.  Echo (12/16) with EF 15-20%, severe LV dilation, diffuse hypokinesis, moderate AI, mild  MR, normal RV size and systolic function. Medtronic ICD.  - CPX (11/17): Submaximal with RER 0.95, VE/VCO2 slope 27, peak VO2 18.2.  Suspect no more than mild HF limitation.  - Echo (1/18): EF 25%, diffuse hypokinesis, normal RV size and systolic function, mild AI.  - Echo (6/19): EF 25-30% with mild LV dilation and mild-moderate AI, normal RV size with mildly decreased systolic function, IVC normal.  4. ACEI cough.  5. Pleural effusion: Thoracentesis on right in 8/16.  It was a transudate, likely due to  CHF.  6. OSA: To start Bipap.  7. Esophageal duplication cyst.  8. High resolution CT chest 12/17: No evidence for interstitial lung disease.  9. Atrial fibrillation: Paroxysmal, 1 10 minute episode in 3/18.  10. VT: ICD discharge 3/18.   SH: Married, retired Pharmacist, hospital, never smoked, no ETOH.   FH: Mother with "weak heart." Brother with sickle cell anemia.   ROS: All systems reviewed and negative except as per HPI.   Current Outpatient Medications  Medication Sig Dispense Refill  . allopurinol (ZYLOPRIM) 100 MG tablet TAKE 2 TABLETS (200 MG TOTAL) BY MOUTH DAILY. 180 tablet 3  . carvedilol (COREG) 6.25 MG tablet Take 3 tablets (18.75 mg total) by mouth 2 (two) times daily with a meal. 180 tablet 0  . Cholecalciferol (VITAMIN D3) 5000 units TABS Take by mouth.    . digoxin (LANOXIN) 0.125 MG tablet Take 0.5 tablets (0.0625 mg total) by mouth every other day. 15 tablet 3  . ferric citrate (AURYXIA) 1 GM 210 MG(Fe) tablet Take 210 mg by mouth daily.    . furosemide (LASIX) 40 MG tablet Take 2 tabs (80 mg total) by mouth every morning and 1 tab (40 mg total) every evening alternating with 2 tabs (80 mg total) twice a day. 315 tablet 3  . hydrALAZINE (APRESOLINE) 25 MG tablet Take 25 mg by mouth 3 (three) times daily.    . isosorbide mononitrate (IMDUR) 30 MG 24 hr tablet TAKE 1 TABLET (30 MG TOTAL) BY MOUTH DAILY. 30 tablet 6   No current facility-administered medications for this encounter.    Filed Weights   04/01/18 1325  Weight: 173 lb 12 oz (78.8 kg)   BP 120/69   Pulse 69   Wt 173 lb 12 oz (78.8 kg)   SpO2 100%   BMI 24.23 kg/m   General: NAD Neck: No JVD, no thyromegaly or thyroid nodule.  Lungs: Clear to auscultation bilaterally with normal respiratory effort. CV: Nondisplaced PMI.  Heart regular S1/S2, no S3/S4, no murmur.  No peripheral edema.  No carotid bruit.  Normal pedal pulses.  Abdomen: Soft, nontender, no hepatosplenomegaly, no distention.  Skin: Intact without  lesions or rashes.  Neurologic: Alert and oriented x 3.  Psych: Normal affect. Extremities: No clubbing or cyanosis.  HEENT: Normal.   Assessment/Plan: 1. Chronic systolic CHF: Nonischemic cardiomyopathy.  CO preserved on RHC in 6/16, but 10/16 CPX showed moderate to severe functional impairment (seems worse than his symptoms would suggest).  However, repeat CPX in 11/17, though submaximal, suggested no more than mild HF limitation. No significant CAD with coronary angiography in 6/16.  No hx heavy ETOH or drug abuse. No marked HTN.  Negative SPEP/UPEP.  Possible prior myocarditis.  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  He now has Medtronic ICD.  Echo was done today and reviewed, EF 25-30% (stable) with mild-moderate AI and mildly decreased RV systolic function.  NYHA class II.  Volume status  stable today by exam and Optivol.  - Continue alternating Lasix 80 mg bid with Lasix 80 qam/40 qpm. BMET today - Off spironolactone with elevated creatinine.    - Increase Coreg to 15.625 mg bid x 1 week, then increase up to 18.75 mg bid after that. - Hold off on ACEI/ARB/Entresto at this time with elevated creatinine.  - Continue hydralazine 25 mg tid and Imdur 30 mg daily, he did not tolerate increase.   - Continue digoxin 0.0625 every other day, check dig level today. 2. CKD: Stage IV.  BMET today.  He follows with Dr Posey Pronto every 3 months 3. OSA: Bipap. 4. Gout: Gout controlled on allopurinol. 5. VT: Appropriate ICD discharge in 3/18, no recurrence.  - Continue coreg - Hold off on anti-arrhythmic unless VT recurs.  6. Atrial fibrillation: Paroxysmal.  Short episodes only noted by device interrogation. He has had no prolonged or symptomatic atrial fibrillation (data for anticoagulating short runs of atrial fibrillation is not strong), so will hold off on anticoagulation.  - No longer taking ASA.  - If he develops prolonged or symptomatic atrial fibrillation, would  anticoagulate.   Followup in 3 months.   [name] 04/01/2018

## 2018-04-01 NOTE — Patient Instructions (Addendum)
Increase Carvedilol 15.625 mg (2.5 tabs), twice a day for 1 week  -Then increase to 18.75 mg (3 tabs), twice a day  Labs drawn today (if we do not call you, then your lab work was stable)   Your physician recommends that you schedule a follow-up appointment in: 3 months with Dr. Aundra Dubin

## 2018-04-17 ENCOUNTER — Other Ambulatory Visit (HOSPITAL_COMMUNITY): Payer: Self-pay | Admitting: Cardiology

## 2018-04-22 ENCOUNTER — Ambulatory Visit (INDEPENDENT_AMBULATORY_CARE_PROVIDER_SITE_OTHER): Payer: Medicare Other

## 2018-04-22 ENCOUNTER — Telehealth: Payer: Self-pay

## 2018-04-22 DIAGNOSIS — I5022 Chronic systolic (congestive) heart failure: Secondary | ICD-10-CM

## 2018-04-22 DIAGNOSIS — Z95810 Presence of automatic (implantable) cardiac defibrillator: Secondary | ICD-10-CM

## 2018-04-22 NOTE — Progress Notes (Signed)
EPIC Encounter for ICM Monitoring  Patient Name: Miguel Roach is a 77 y.o. male Date: 04/22/2018 Primary Care Physican: Deland Pretty, MD Primary Cardiologist:[place] Electrophysiologist: Lovena Le Nephrologist: [place] Dry Weight: 172lbs      Heart Failure questions reviewed, pt has gained a couple of pounds but denies any breathing difficulties.  Patient on vacation during time of decreased impedance and just returned home in the last few days.   Thoracic impedance abnormal suggesting fluid accumulation starting 04/07/2018.  Prescribed dosage: Furosemide 40 mg take 2 tablets (80 mg total) bymouth every morningand 1 tablet (40 mg total)everyeveningalternating every other day with 80 mg twice a day.  Labs: 04/01/2018 Creatinine 3.12, BUN 42, Potassium 3.9, Sodium 144, EGFR 18-21 01/02/2018 Creatinine 3.19, BUN 42, Potassium 3.8, Sodium 140, EGFR 17-20 12/03/2017 Creatinine 3.23, BUN 44, Potassium 4.0, Sodium 145, EGFR 18-20 11/12/2017 Creatinine 3.15, BUN 44, Potassium 3.8, Sodium 141, EGFR 18-21 10/03/2017 Creatinine 2.99, BUN 34, Potassium 3.6, Sodium 136, EGFR 19-22 09/10/2017 Creatinine 3.24, BUN 49, Potassium 4.0, Sodium 141, EGFR 17-20 09/03/2017 Creatinine 3.77, BUN 26, Potassium 3.6, Sodium 140, EGFR 14-17 08/20/2017 Creatinine 3.00, BUN 36, Potassium 3.6, Sodium 139, EGFR 19-22 07/24/2017 Creatinine 3.65, BUN 72, Potassium 4.5, Sodium 143, EGFR 15 05/15/2017 Creatinine 3.12, BUN 42, Potassium 4.2, Sodium 137, EGFR 18-21 02/12/2017 Creatinine 3.39, BUN 55, Potassium 4.3, Sodium 139, EGFR 16-19 12/14/2016 Creatinine 3.17, BUN 4.1,Potassium 4.4, Sodium 139, EGFR 18-21 11/22/2016 Creatinine 2.96, BUN 39, Potassium 4.3, Sodium 142, EGFR 19-22  Recommendations:  Patient is resuming low salt diet since returning from vacation.    Follow-up plan: ICM clinic phone appointment on 04/26/2018 (manual send) to recheck fluid levels.    Appointments: Office  appointment scheduled 07/05/2018 with Dr. Aundra Dubin.  Recall appt 01/11/2019 with Dr. Caryl Comes.  Copy of ICM check sent to Dr. Caryl Comes and Dr Aundra Dubin for review and if any recommendations will call back.   3 month ICM trend: 04/22/2018    1 Year ICM trend:       Rosalene Billings, RN 04/22/2018 11:13 AM

## 2018-04-22 NOTE — Telephone Encounter (Signed)
Remote ICM transmission received.  Attempted call to patient and left message to return call. 

## 2018-04-23 ENCOUNTER — Other Ambulatory Visit (HOSPITAL_COMMUNITY): Payer: Self-pay | Admitting: Cardiology

## 2018-04-25 ENCOUNTER — Other Ambulatory Visit: Payer: Self-pay | Admitting: Cardiology

## 2018-04-26 ENCOUNTER — Ambulatory Visit (INDEPENDENT_AMBULATORY_CARE_PROVIDER_SITE_OTHER): Payer: Self-pay

## 2018-04-26 DIAGNOSIS — I5022 Chronic systolic (congestive) heart failure: Secondary | ICD-10-CM

## 2018-04-26 DIAGNOSIS — Z95810 Presence of automatic (implantable) cardiac defibrillator: Secondary | ICD-10-CM

## 2018-04-26 NOTE — Progress Notes (Signed)
EPIC Encounter for ICM Monitoring  Patient Name: Miguel Roach is a 77 y.o. male Date: 04/26/2018 Primary Care Physican: Deland Pretty, MD Primary [place] Electrophysiologist: Lovena Le Nephrologist: Narda Amber Kidney - [place] Dry Weight: 172lbs       Heart Failure questions reviewed, pt asymptomatic.   Patient on vacation during time of decreased impedance and just returned home in the last few days.   Thoracic impedance improved and trending closer to baseline normal since last ICM remote transmission on 04/22/2018.  Prescribed dosage: Furosemide 40 mg take 2 tablets (80 mg total) bymouth every morningand 1 tablet (40 mg total)everyeveningalternating every other day with 80 mg twice a day.  Labs: 04/01/2018 Creatinine 3.12, BUN 42, Potassium 3.9, Sodium 144, EGFR 18-21 01/02/2018 Creatinine 3.19, BUN 42, Potassium 3.8, Sodium 140, EGFR 17-20 12/03/2017 Creatinine 3.23, BUN 44, Potassium 4.0, Sodium 145, EGFR 18-20 11/12/2017 Creatinine 3.15, BUN 44, Potassium 3.8, Sodium 141, EGFR 18-21 10/03/2017 Creatinine 2.99, BUN 34, Potassium 3.6, Sodium 136, EGFR 19-22 09/10/2017 Creatinine 3.24, BUN 49, Potassium 4.0, Sodium 141, EGFR 17-20 09/03/2017 Creatinine 3.77, BUN 26, Potassium 3.6, Sodium 140, EGFR 14-17 08/20/2017 Creatinine 3.00, BUN 36, Potassium 3.6, Sodium 139, EGFR 19-22 07/24/2017 Creatinine 3.65, BUN 72, Potassium 4.5, Sodium 143, EGFR 15 05/15/2017 Creatinine 3.12, BUN 42, Potassium 4.2, Sodium 137, EGFR 18-21 02/12/2017 Creatinine 3.39, BUN 55, Potassium 4.3, Sodium 139, EGFR 16-19 12/14/2016 Creatinine 3.17, BUN 4.1,Potassium 4.4, Sodium 139, EGFR 18-21 11/22/2016 Creatinine 2.96, BUN 39, Potassium 4.3, Sodium 142, EGFR 19-22  Recommendations: No changes.  Encouraged to call for fluid symptoms.  Follow-up plan: ICM clinic phone appointment on 05/23/2018.     Copy of ICM check sent to Dr. Lovena Le.    3 month ICM trend: 04/26/2018    1 Year ICM  trend:       Rosalene Billings, RN 04/26/2018 9:13 AM

## 2018-04-28 NOTE — Progress Notes (Signed)
Take Lasix 80 mg bid x 3 days then back to baseline dosing.

## 2018-04-29 NOTE — Progress Notes (Signed)
Call to patient and advised of Dr Oleh Genin recommendation to increase Furosemide to 80 mg bid x 3 days and then return to prescribed dosage.  Will recheck fluid levels on 05/03/2018.

## 2018-04-29 NOTE — Progress Notes (Signed)
Call to patient and advised Dr Aundra Dubin reviewed transmission and ordered to increase Furosemide to 80 mg bid x 3 days and then return to prescribed dosage (see ICM note 7/8).  Will recheck fluid levels 05/03/2018

## 2018-05-03 ENCOUNTER — Ambulatory Visit (INDEPENDENT_AMBULATORY_CARE_PROVIDER_SITE_OTHER): Payer: Medicare Other

## 2018-05-03 DIAGNOSIS — Z95810 Presence of automatic (implantable) cardiac defibrillator: Secondary | ICD-10-CM

## 2018-05-03 DIAGNOSIS — I5022 Chronic systolic (congestive) heart failure: Secondary | ICD-10-CM

## 2018-05-03 NOTE — Progress Notes (Signed)
EPIC Encounter for ICM Monitoring  Patient Name: Miguel Roach is a 77 y.o. male Date: 05/03/2018 Primary Care Physican: Deland Pretty, MD Primary [place] Electrophysiologist: Lovena Le Nephrologist: [place] Dry Weight: 167lbs       Heart Failure questions reviewed, pt asymptomatic.  He has been to the [facility] 4 days this week and does walking and strengthening exercises.    Thoracic impedance returned to normal after taking 3 days of extra Furosemide as ordered by Dr Aundra Dubin.  Prescribed dosage: Furosemide 40 mg take 2 tablets (80 mg total) bymouth every morningand 1 tablet (40 mg total)everyeveningalternating every other day with 80 mg twice a day.  Labs: 04/01/2018 Creatinine 3.12, BUN 42, Potassium 3.9, Sodium 144, EGFR 18-21 01/02/2018 Creatinine 3.19, BUN 42, Potassium 3.8, Sodium 140, EGFR 17-20 12/03/2017 Creatinine 3.23, BUN 44, Potassium 4.0, Sodium 145, EGFR 18-20 11/12/2017 Creatinine 3.15, BUN 44, Potassium 3.8, Sodium 141, EGFR 18-21 10/03/2017 Creatinine 2.99, BUN 34, Potassium 3.6, Sodium 136, EGFR 19-22 09/10/2017 Creatinine 3.24, BUN 49, Potassium 4.0, Sodium 141, EGFR 17-20 09/03/2017 Creatinine 3.77, BUN 26, Potassium 3.6, Sodium 140, EGFR 14-17 08/20/2017 Creatinine 3.00, BUN 36, Potassium 3.6, Sodium 139, EGFR 19-22 07/24/2017 Creatinine 3.65, BUN 72, Potassium 4.5, Sodium 143, EGFR 15 05/15/2017 Creatinine 3.12, BUN 42, Potassium 4.2, Sodium 137, EGFR 18-21 02/12/2017 Creatinine 3.39, BUN 55, Potassium 4.3, Sodium 139, EGFR 16-19 12/14/2016 Creatinine 3.17, BUN 4.1,Potassium 4.4, Sodium 139, EGFR 18-21 11/22/2016 Creatinine 2.96, BUN 39, Potassium 4.3, Sodium 142, EGFR 19-22  Recommendations: No changes.  Encouraged to call for fluid symptoms.  Follow-up plan: ICM clinic phone appointment on 05/23/2018.       Copy of ICM check sent to Dr. Lovena Le.   3 month ICM trend: 05/03/2018    1 Year ICM trend:       Rosalene Billings, RN 05/03/2018 12:01 PM

## 2018-05-23 ENCOUNTER — Ambulatory Visit (INDEPENDENT_AMBULATORY_CARE_PROVIDER_SITE_OTHER): Payer: Medicare Other | Admitting: *Deleted

## 2018-05-23 ENCOUNTER — Ambulatory Visit (INDEPENDENT_AMBULATORY_CARE_PROVIDER_SITE_OTHER): Payer: Medicare Other

## 2018-05-23 DIAGNOSIS — I428 Other cardiomyopathies: Secondary | ICD-10-CM | POA: Diagnosis not present

## 2018-05-23 DIAGNOSIS — Z95810 Presence of automatic (implantable) cardiac defibrillator: Secondary | ICD-10-CM | POA: Diagnosis not present

## 2018-05-23 DIAGNOSIS — I5022 Chronic systolic (congestive) heart failure: Secondary | ICD-10-CM

## 2018-05-24 NOTE — Progress Notes (Signed)
Remote ICD transmission.   

## 2018-05-24 NOTE — Progress Notes (Signed)
EPIC Encounter for ICM Monitoring  Patient Name: Miguel Roach is a 77 y.o. male Date: 05/24/2018 Primary Care Physican: Deland Pretty, MD Primary Cardiologist:[place] Electrophysiologist: Lovena Le Nephrologist: Narda Amber Kidney - [place] Dry Weight: 167lbs      Heart Failure questions reviewed, pt asymptomatic.   Thoracic impedance normal.  Prescribed dosage: Furosemide 40 mg take 2 tablets (80 mg total) bymouth every morningand 1 tablet (40 mg total)everyeveningalternating every other day with 80 mg twice a day.  Labs: 04/01/2018 Creatinine 3.12, BUN 42, Potassium 3.9, Sodium 144, EGFR 18-21 01/02/2018 Creatinine 3.19, BUN 42, Potassium 3.8, Sodium 140, EGFR 17-20 12/03/2017 Creatinine 3.23, BUN 44, Potassium 4.0, Sodium 145, EGFR 18-20 11/12/2017 Creatinine 3.15, BUN 44, Potassium 3.8, Sodium 141, EGFR 18-21 10/03/2017 Creatinine 2.99, BUN 34, Potassium 3.6, Sodium 136, EGFR 19-22 09/10/2017 Creatinine 3.24, BUN 49, Potassium 4.0, Sodium 141, EGFR 17-20 09/03/2017 Creatinine 3.77, BUN 26, Potassium 3.6, Sodium 140, EGFR 14-17 08/20/2017 Creatinine 3.00, BUN 36, Potassium 3.6, Sodium 139, EGFR 19-22 07/24/2017 Creatinine 3.65, BUN 72, Potassium 4.5, Sodium 143, EGFR 15 05/15/2017 Creatinine 3.12, BUN 42, Potassium 4.2, Sodium 137, EGFR 18-21 02/12/2017 Creatinine 3.39, BUN 55, Potassium 4.3, Sodium 139, EGFR 16-19 12/14/2016 Creatinine 3.17, BUN 4.1,Potassium 4.4, Sodium 139, EGFR 18-21 11/22/2016 Creatinine 2.96, BUN 39, Potassium 4.3, Sodium 142, EGFR 19-22  Recommendations: No changes.  Encouraged to call for fluid symptoms.  Follow-up plan: ICM clinic phone appointment on 06/24/2018.     Copy of ICM check sent to Dr. Lovena Le.   3 month ICM trend: 05/23/2018    1 Year ICM trend:       Rosalene Billings, RN 05/24/2018 1:10 PM

## 2018-06-12 LAB — CUP PACEART REMOTE DEVICE CHECK
Battery Remaining Longevity: 121 mo
Battery Voltage: 3.02 V
Brady Statistic RV Percent Paced: 0.21 %
Date Time Interrogation Session: 20190808084227
HighPow Impedance: 66 Ohm
Implantable Lead Location: 753860
Implantable Lead Model: 6935
Implantable Pulse Generator Implant Date: 20170208
Lead Channel Impedance Value: 399 Ohm
Lead Channel Pacing Threshold Pulse Width: 0.4 ms
Lead Channel Sensing Intrinsic Amplitude: 11.375 mV
Lead Channel Setting Pacing Amplitude: 2 V
Lead Channel Setting Pacing Pulse Width: 0.4 ms
Lead Channel Setting Sensing Sensitivity: 0.3 mV
MDC IDC LEAD IMPLANT DT: 20170208
MDC IDC MSMT LEADCHNL RV IMPEDANCE VALUE: 304 Ohm
MDC IDC MSMT LEADCHNL RV PACING THRESHOLD AMPLITUDE: 1 V
MDC IDC MSMT LEADCHNL RV SENSING INTR AMPL: 11.375 mV

## 2018-06-24 ENCOUNTER — Ambulatory Visit (INDEPENDENT_AMBULATORY_CARE_PROVIDER_SITE_OTHER): Payer: Medicare Other

## 2018-06-24 DIAGNOSIS — I5022 Chronic systolic (congestive) heart failure: Secondary | ICD-10-CM

## 2018-06-24 DIAGNOSIS — Z95810 Presence of automatic (implantable) cardiac defibrillator: Secondary | ICD-10-CM

## 2018-06-25 NOTE — Progress Notes (Signed)
EPIC Encounter for ICM Monitoring  Patient Name: Miguel Roach is a 77 y.o. male Date: 06/25/2018 Primary Care Physican: Deland Pretty, MD Primary Cardiologist:[place] Electrophysiologist: Lovena Le Nephrologist: Narda Amber Kidney - [place] Dry Weight: 168lbs  Clinical Status Since 09-Jun-2018 AF 2 Time in AF <0.1 hr/day (<0.1%)         Heart Failure questions reviewed, pt asymptomatic.   Thoracic impedance normal.  Prescribed dosage: Furosemide 40 mg take 2 tablets (80 mg total) bymouth every morningand 1 tablet (40 mg total)everyeveningalternating every other day with 80 mg twice a day.  Labs: 04/01/2018 Creatinine 3.12, BUN 42, Potassium 3.9, Sodium 144, EGFR 18-21 01/02/2018 Creatinine 3.19, BUN 42, Potassium 3.8, Sodium 140, EGFR 17-20 12/03/2017 Creatinine 3.23, BUN 44, Potassium 4.0, Sodium 145, EGFR 18-20 11/12/2017 Creatinine 3.15, BUN 44, Potassium 3.8, Sodium 141, EGFR 18-21 10/03/2017 Creatinine 2.99, BUN 34, Potassium 3.6, Sodium 136, EGFR 19-22 09/10/2017 Creatinine 3.24, BUN 49, Potassium 4.0, Sodium 141, EGFR 17-20 09/03/2017 Creatinine 3.77, BUN 26, Potassium 3.6, Sodium 140, EGFR 14-17 08/20/2017 Creatinine 3.00, BUN 36, Potassium 3.6, Sodium 139, EGFR 19-22 07/24/2017 Creatinine 3.65, BUN 72, Potassium 4.5, Sodium 143, EGFR 15 05/15/2017 Creatinine 3.12, BUN 42, Potassium 4.2, Sodium 137, EGFR 18-21 02/12/2017 Creatinine 3.39, BUN 55, Potassium 4.3, Sodium 139, EGFR 16-19 12/14/2016 Creatinine 3.17, BUN 4.1,Potassium 4.4, Sodium 139, EGFR 18-21 11/22/2016 Creatinine 2.96, BUN 39, Potassium 4.3, Sodium 142, EGFR 19-22  Recommendations: No changes.   Encouraged to call for fluid symptoms.  Follow-up plan: ICM clinic phone appointment on 07/25/2018.  Office appointment scheduled 07/05/2018 with Dr. Aundra Dubin.    Copy of ICM check sent to Dr. Lovena Le.   3 month ICM trend: 06/24/2018    1 Year ICM trend:       Rosalene Billings, RN 06/25/2018 10:43  AM

## 2018-07-05 ENCOUNTER — Other Ambulatory Visit (HOSPITAL_COMMUNITY): Payer: Self-pay | Admitting: Cardiology

## 2018-07-05 ENCOUNTER — Ambulatory Visit (HOSPITAL_COMMUNITY)
Admission: RE | Admit: 2018-07-05 | Discharge: 2018-07-05 | Disposition: A | Discharge status: Home / Self Care | Payer: Medicare Other | Source: Ambulatory Visit | Attending: Cardiology | Admitting: Cardiology

## 2018-07-05 VITALS — BP 108/70 | HR 71 | Wt 174.8 lb

## 2018-07-05 DIAGNOSIS — I2510 Atherosclerotic heart disease of native coronary artery without angina pectoris: Secondary | ICD-10-CM | POA: Insufficient documentation

## 2018-07-05 DIAGNOSIS — E785 Hyperlipidemia, unspecified: Secondary | ICD-10-CM

## 2018-07-05 DIAGNOSIS — I428 Other cardiomyopathies: Secondary | ICD-10-CM

## 2018-07-05 DIAGNOSIS — Z79899 Other long term (current) drug therapy: Secondary | ICD-10-CM | POA: Diagnosis not present

## 2018-07-05 DIAGNOSIS — I429 Cardiomyopathy, unspecified: Secondary | ICD-10-CM | POA: Insufficient documentation

## 2018-07-05 DIAGNOSIS — I480 Paroxysmal atrial fibrillation: Secondary | ICD-10-CM | POA: Diagnosis not present

## 2018-07-05 DIAGNOSIS — N184 Chronic kidney disease, stage 4 (severe): Secondary | ICD-10-CM | POA: Insufficient documentation

## 2018-07-05 DIAGNOSIS — M109 Gout, unspecified: Secondary | ICD-10-CM | POA: Diagnosis not present

## 2018-07-05 DIAGNOSIS — Z95810 Presence of automatic (implantable) cardiac defibrillator: Secondary | ICD-10-CM | POA: Insufficient documentation

## 2018-07-05 DIAGNOSIS — I5022 Chronic systolic (congestive) heart failure: Secondary | ICD-10-CM

## 2018-07-05 DIAGNOSIS — Z8249 Family history of ischemic heart disease and other diseases of the circulatory system: Secondary | ICD-10-CM | POA: Diagnosis not present

## 2018-07-05 DIAGNOSIS — G4733 Obstructive sleep apnea (adult) (pediatric): Secondary | ICD-10-CM | POA: Insufficient documentation

## 2018-07-05 LAB — LIPID PANEL
CHOLESTEROL: 149 mg/dL (ref 0–200)
HDL: 37 mg/dL — ABNORMAL LOW (ref >40)
LDL Cholesterol: 91 mg/dL (ref 0–99)
TRIGLYCERIDES: 107 mg/dL (ref <150)
Total CHOL/HDL Ratio: 4 RATIO
VLDL: 21 mg/dL (ref 0–40)

## 2018-07-05 LAB — BASIC METABOLIC PANEL
Anion gap: 11 (ref 5–15)
BUN: 58 mg/dL — ABNORMAL HIGH (ref 8–23)
CALCIUM: 9.3 mg/dL (ref 8.9–10.3)
CHLORIDE: 109 mmol/L (ref 98–111)
CO2: 22 mmol/L (ref 22–32)
Creatinine, Ser: 3.58 mg/dL — ABNORMAL HIGH (ref 0.61–1.24)
GFR calc non Af Amer: 15 mL/min — ABNORMAL LOW (ref >60)
GFR, EST AFRICAN AMERICAN: 18 mL/min — AB (ref >60)
Glucose, Bld: 98 mg/dL (ref 70–99)
Potassium: 3.9 mmol/L (ref 3.5–5.1)
Sodium: 142 mmol/L (ref 135–145)

## 2018-07-05 LAB — DIGOXIN LEVEL: Digoxin Level: 0.3 ng/mL — ABNORMAL LOW (ref 0.8–2.0)

## 2018-07-05 MED ORDER — HYDRALAZINE HCL 25 MG PO TABS: 25.0000 mg | ORAL_TABLET | Freq: Three times a day (TID) | ORAL | 6 refills | Status: DC

## 2018-07-05 MED ORDER — CARVEDILOL 12.5 MG PO TABS: 12.5000 mg | ORAL_TABLET | Freq: Two times a day (BID) | ORAL | 6 refills | Status: DC

## 2018-07-05 NOTE — Patient Instructions (Signed)
Labs today  Your physician recommends that you schedule a follow-up appointment in: 3 months  

## 2018-07-07 NOTE — Progress Notes (Signed)
P  Advanced Heart Failure Clinic Note   Patient ID: Miguel Roach, male   DOB: 08-09-1941, 77 y.o.   MRN: [id number] PCP: Dr. Shelia Media Cardiology: Dr. Aundra Dubin   77 y.o. with history of chronic systolic CHF/nonischemic cardiomyopathy and CKD presents for followup of CHF. He actually had an echo back in 2010 with EF 40-45% at the time.  He says that this really was not followed up upon by a cardiologist and that he did well for a number of years after that. In 6/16, he was admitted with acute systolic CHF.  Echo showed EF down to 20-25%.  Cardiac cath showed nonobstructive CAD.  His Lasix has been gradually increased.  This has brought his weight down and improved his breathing, but creatinine has risen. He had been on olmesartan but this was stopped because he thought it was causing cough.  He was then put on losartan, but this was stopped with rise in creatinine. Also intolerant to Bidil with orthostatic symptoms.  He was started on digoxin.  Echo in 12/16 showed persistently low EF, 15-20%.  Medtronic ICD was placed in 2/17.  Repeat echo 1/18 showed EF 25% with diffuse hypokinesis.   In 3/18, he had an episode of about 10 minutes atrial fibrillation noted on his ICD.  He also had 1 episode of VT terminated by an appropriate discharge.  He had push-mowed his grass and was very fatigued when he had the shock.  Since then, he has had no further arrhythmias.   Echo in 6/19 showed EF 25-30% with mild LV dilation and mild-moderate AI, normal RV size with mildly decreased systolic function, IVC normal.   He returns today for HF follow up. He is doing well overall.  No significant exertional dyspnea.  Goes to the [facility] 4-5 days/week. Feels good.  No chest pain.  No orthopnea/PND.  No lightheadedness currently but could not tolerate 18.75 mg bid Coreg and back to 12.5 mg bid.   Medtronic device interrogation: fluid index < threshold with stable thoracic impedance.  He has rare short runs of atrial fibrillation,  never longer than 30 minutes.   Labs (6/16): BNP 1954, TSH normal Labs (7/16): K 4.1, creatinine 1.72 Labs (8/16): creatinine 2.3 Labs (9/16): digoxin 0.3, K 4, creatinine 2.14, SPEP negative, UPEP negative Labs (10/16): K 4.6, creatinine 1.62 Labs (11/16): K 4.4, creatinine 2.33, digoxin 0.5 Labs (12/16): creatinine 2.1 (nephrology) Labs (1/17): digoxin 0.6, uric acid 10.4 Labs (2/17): K 4, creatinine 2.13, hgb 11.6 Labs (5/17): K 3.8, creatinine 2.22, digoxin 0.7 Labs (9/17): K 4.4, creatinine 2.88, LFTs normal, digoxin 1.0, hgb 11.8, LDL 88, HDL 50 Labs (1/18): digoxin 0.5 Labs (3/18): K 4.4, creatinine 3.17 Labs (4/18): K 4.3, creatinine 3.39, digoxin 0.6 Labs (10/18): K 4.5, creatinine 3.65, digoxin 0.4 Labs (11/18): K 4, creatinine 3.24, digoxin 0.6 Labs (12/18): digoxin < 0.2 Labs (1/19): K 3.8, creatinine 3.15 Labs (2/19): K 4.0, creatinine 3.23 Labs (3/19): digoxin 0.4, K 3.8, creatinine 3.19 Labs (6/19): digoxin 0.3, K 3.9, creatinine 3.12  PMH: 1. CKD: Stage IV.  Follows with Dr Posey Pronto.  2. Gout 3. Chronic systolic CHF: Nonischemic cardiomyopathy.  EF 40-45% in 2010.  Echo (6/16) with EF 20-25%, severe LV dilation, mild MR, mild AI, moderate LAE.  LHC/RHC (6/16) with nonobstructive CAD; mean RA 2, RV 48/1, PA 47/18, mean PCWP 12, CI 3.2.  CPX (10/16) with peak VO2 13.1 (50% predicted), VE/VCO2 slope 39.3, RER 1.22 => moderate to severe functional limitation.  Echo (12/16)  with EF 15-20%, severe LV dilation, diffuse hypokinesis, moderate AI, mild MR, normal RV size and systolic function. Medtronic ICD.  - CPX (11/17): Submaximal with RER 0.95, VE/VCO2 slope 27, peak VO2 18.2.  Suspect no more than mild HF limitation.  - Echo (1/18): EF 25%, diffuse hypokinesis, normal RV size and systolic function, mild AI.  - Echo (6/19): EF 25-30% with mild LV dilation and mild-moderate AI, normal RV size with mildly decreased systolic function, IVC normal.  4. ACEI cough.  5. Pleural  effusion: Thoracentesis on right in 8/16.  It was a transudate, likely due to CHF.  6. OSA: To start Bipap.  7. Esophageal duplication cyst.  8. High resolution CT chest 12/17: No evidence for interstitial lung disease.  9. Atrial fibrillation: Paroxysmal, 1 10 minute episode in 3/18.  10. VT: ICD discharge 3/18.   SH: Married, retired Pharmacist, hospital, never smoked, no ETOH.   FH: Mother with "weak heart." Brother with sickle cell anemia.   ROS: All systems reviewed and negative except as per HPI.   Current Outpatient Medications  Medication Sig Dispense Refill  . allopurinol (ZYLOPRIM) 100 MG tablet TAKE 2 TABLETS (200 MG TOTAL) BY MOUTH DAILY. 180 tablet 3  . carvedilol (COREG) 12.5 MG tablet Take 1 tablet (12.5 mg total) by mouth 2 (two) times daily with a meal. 60 tablet 6  . Cholecalciferol (VITAMIN D3) 5000 units TABS Take by mouth.    . digoxin (LANOXIN) 0.125 MG tablet TAKE 0.5 TABLETS (0.0625 MG TOTAL) BY MOUTH EVERY OTHER DAY. 30 tablet 3  . ferric citrate (AURYXIA) 1 GM 210 MG(Fe) tablet Take 210 mg by mouth daily.    . furosemide (LASIX) 40 MG tablet Take 2 tabs (80 mg total) by mouth every morning and 1 tab (40 mg total) every evening alternating with 2 tabs (80 mg total) twice a day. 315 tablet 3  . hydrALAZINE (APRESOLINE) 25 MG tablet Take 1 tablet (25 mg total) by mouth 3 (three) times daily. 90 tablet 6  . isosorbide mononitrate (IMDUR) 30 MG 24 hr tablet TAKE 1 TABLET (30 MG TOTAL) BY MOUTH DAILY. 30 tablet 6   No current facility-administered medications for this encounter.    Filed Weights   07/05/18 1052  Weight: 79.3 kg (174 lb 12.8 oz)   BP 108/70   Pulse 71   Wt 79.3 kg (174 lb 12.8 oz)   SpO2 97%   BMI 24.38 kg/m   General: NAD Neck: No JVD, no thyromegaly or thyroid nodule.  Lungs: Clear to auscultation bilaterally with normal respiratory effort. CV: Nondisplaced PMI.  Heart regular S1/S2, no S3/S4, no murmur.  No peripheral edema.  No carotid bruit.  Normal  pedal pulses.  Abdomen: Soft, nontender, no hepatosplenomegaly, no distention.  Skin: Intact without lesions or rashes.  Neurologic: Alert and oriented x 3.  Psych: Normal affect. Extremities: No clubbing or cyanosis.  HEENT: Normal.   Assessment/Plan: 1. Chronic systolic CHF: Nonischemic cardiomyopathy.  CO preserved on RHC in 6/16, but 10/16 CPX showed moderate to severe functional impairment (seems worse than his symptoms would suggest).  However, repeat CPX in 11/17, though submaximal, suggested no more than mild HF limitation. No significant CAD with coronary angiography in 6/16.  No hx heavy ETOH or drug abuse. No marked HTN.  Negative SPEP/UPEP.  Possible prior myocarditis.  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  He now has Medtronic ICD.  Echo in 6/19 showed EF 25-30% (stable) with mild-moderate AI  and mildly decreased RV systolic function.  NYHA class II.  Volume status stable today by exam and Optivol.  - Continue alternating Lasix 80 mg bid with Lasix 80 qam/40 qpm. BMET today - Off spironolactone with elevated creatinine.    - Continue Coreg 12.5 mg bid, does not tolerate increase due to lightheadedness. - Hold off on ACEI/ARB/Entresto at this time with elevated creatinine.  - Continue hydralazine 25 mg tid and Imdur 30 mg daily, he did not tolerate increase due to lightheadedness.   - Continue digoxin 0.0625 every other day, check dig level today.  Creatinine is high but digoxin level has been steady and not elevated.  2. CKD: Stage IV.  BMET today.  He follows with Dr Posey Pronto every 3 months 3. OSA: Bipap. 4. Gout: Gout controlled on allopurinol. 5. VT: Appropriate ICD discharge in 3/18, no recurrence.  - Continue coreg - Hold off on anti-arrhythmic unless VT recurs.  6. Atrial fibrillation: Paroxysmal.  Very short episodes only noted by device interrogation. He has had no prolonged or symptomatic atrial fibrillation (data for anticoagulating  short runs of atrial fibrillation is not strong), so will hold off on anticoagulation.  - No longer taking ASA.  - If he develops prolonged or symptomatic atrial fibrillation, would anticoagulate.   Followup in 3 months.   [name] 07/07/2018

## 2018-07-09 ENCOUNTER — Other Ambulatory Visit (HOSPITAL_COMMUNITY): Payer: Self-pay | Admitting: Internal Medicine

## 2018-07-19 ENCOUNTER — Other Ambulatory Visit: Payer: Self-pay | Admitting: Internal Medicine

## 2018-07-25 ENCOUNTER — Ambulatory Visit (INDEPENDENT_AMBULATORY_CARE_PROVIDER_SITE_OTHER): Payer: Medicare Other

## 2018-07-25 DIAGNOSIS — I5022 Chronic systolic (congestive) heart failure: Secondary | ICD-10-CM | POA: Diagnosis not present

## 2018-07-25 DIAGNOSIS — Z95810 Presence of automatic (implantable) cardiac defibrillator: Secondary | ICD-10-CM

## 2018-07-26 NOTE — Progress Notes (Signed)
EPIC Encounter for ICM Monitoring  Patient Name: Miguel Roach is a 77 y.o. male Date: 07/26/2018 Primary Care Physican: Deland Pretty, MD Primary [place] Electrophysiologist: Lovena Le Nephrologist: Narda [facility] - Patel Dry Weight: 168lbs      Heart Failure questions reviewed, pt asymptomatic.   Thoracic impedance close to baseline normal but was abnormal suggesting fluid accumulation from 07/06/2018 - 07/25/2018.  Fluid index <threshold.  Prescribed: Furosemide 40 mg take 2 tablets (80 mg total) bymouth every morningand 1 tablet (40 mg total)everyeveningalternating every other day with 80 mg twice a day.  Labs: 07/05/2018 Creatinine 3.58, BUN 58, Potassium 3.9, Sodium 142, eGFR 15-18 04/01/2018 Creatinine 3.12, BUN 42, Potassium 3.9, Sodium 144, EGFR 18-21 01/02/2018 Creatinine 3.19, BUN 42, Potassium 3.8, Sodium 140, EGFR 17-20 12/03/2017 Creatinine 3.23, BUN 44, Potassium 4.0, Sodium 145, EGFR 18-20 11/12/2017 Creatinine 3.15, BUN 44, Potassium 3.8, Sodium 141, EGFR 18-21  Recommendations: No changes.    Encouraged to call for fluid symptoms.  Follow-up plan: ICM clinic phone appointment on 08/26/2018.   Office appointment scheduled 10/14/2018 with Dr. Aundra Dubin.    Copy of ICM check sent to Dr. Lovena Le and Dr Aundra Dubin.   3 month ICM trend: 07/25/2018    1 Year ICM trend:       Rosalene Billings, RN 07/26/2018 11:45 AM

## 2018-08-26 ENCOUNTER — Ambulatory Visit (INDEPENDENT_AMBULATORY_CARE_PROVIDER_SITE_OTHER): Payer: Medicare Other | Admitting: *Deleted

## 2018-08-26 ENCOUNTER — Ambulatory Visit (INDEPENDENT_AMBULATORY_CARE_PROVIDER_SITE_OTHER): Payer: Medicare Other

## 2018-08-26 DIAGNOSIS — I5022 Chronic systolic (congestive) heart failure: Secondary | ICD-10-CM | POA: Diagnosis not present

## 2018-08-26 DIAGNOSIS — Z95810 Presence of automatic (implantable) cardiac defibrillator: Secondary | ICD-10-CM

## 2018-08-26 DIAGNOSIS — I428 Other cardiomyopathies: Secondary | ICD-10-CM

## 2018-08-26 NOTE — Progress Notes (Signed)
Remote ICD transmission.   

## 2018-08-27 ENCOUNTER — Other Ambulatory Visit (HOSPITAL_COMMUNITY): Payer: Self-pay | Admitting: Cardiology

## 2018-08-27 NOTE — Progress Notes (Signed)
EPIC Encounter for ICM Monitoring  Patient Name: Miguel Roach is a 77 y.o. male Date: 08/27/2018 Primary Care Physican: Deland Pretty, MD Primary Cardiologist:[place] Electrophysiologist: Lovena Le Nephrologist: [place] Last Weight: 168lbs Today's Weight: 168-170 lbs   Since 25-Jul-2018 AF     7  Time in AF <0.1 hr/day (0.1%)       Heart Failure questions reviewed, pt asymptomatic.   Thoracic impedance normal.   Prescribed: Furosemide 40 mg take 2 tablets (80 mg total) bymouth every morningand 1 tablet (40 mg total)everyeveningalternating every other day with 80 mg twice a day.  Labs: 07/05/2018 Creatinine 3.58, BUN 58, Potassium 3.9, Sodium 142, eGFR 15-18 04/01/2018 Creatinine 3.12, BUN 42, Potassium 3.9, Sodium 144, EGFR 18-21 01/02/2018 Creatinine 3.19, BUN 42, Potassium 3.8, Sodium 140, EGFR 17-20 12/03/2017 Creatinine 3.23, BUN 44, Potassium 4.0, Sodium 145, EGFR 18-20 11/12/2017 Creatinine 3.15, BUN 44, Potassium 3.8, Sodium 141, EGFR 18-21  Recommendations: No changes.  .  Encouraged to call for fluid symptoms.  Follow-up plan: ICM clinic phone appointment on 09/26/2018.   Office appointment scheduled 10/14/2018 with Dr. Aundra Dubin.    Copy of ICM check sent to Dr. Lovena Le.   3 month ICM trend: 08/26/2018    1 Year ICM trend:       Rosalene Billings, RN 08/27/2018 12:21 PM

## 2018-09-26 ENCOUNTER — Ambulatory Visit (INDEPENDENT_AMBULATORY_CARE_PROVIDER_SITE_OTHER): Payer: Medicare Other

## 2018-09-26 DIAGNOSIS — Z95810 Presence of automatic (implantable) cardiac defibrillator: Secondary | ICD-10-CM

## 2018-09-26 DIAGNOSIS — I5022 Chronic systolic (congestive) heart failure: Secondary | ICD-10-CM

## 2018-09-27 NOTE — Progress Notes (Signed)
EPIC Encounter for ICM Monitoring  Patient Name: Miguel Roach is a 77 y.o. male Date: 09/27/2018 Primary Care Physican: Deland Pretty, MD Primary [place] Electrophysiologist: Lovena Le Nephrologist: [place] Last Weight: 168lbs Today's Weight: unknown                                                    Transmission reviewed   Thoracic impedance normal.   Prescribed: Furosemide 40 mg take 2 tablets (80 mg total) bymouth every morningand 1 tablet (40 mg total)everyeveningalternating every other day with 80 mg twice a day.  Labs: 07/05/2018 Creatinine 3.58, BUN 58, Potassium 3.9, Sodium 142, eGFR 15-18 04/01/2018 Creatinine 3.12, BUN 42, Potassium 3.9, Sodium 144, EGFR 18-21 01/02/2018 Creatinine 3.19, BUN 42, Potassium 3.8, Sodium 140, EGFR 17-20 12/03/2017 Creatinine 3.23, BUN 44, Potassium 4.0, Sodium 145, EGFR 18-20 11/12/2017 Creatinine 3.15, BUN 44, Potassium 3.8, Sodium 141, EGFR 18-21  Recommendations: None  Follow-up plan: ICM clinic phone appointment on 10/31/2018.   Office appointment scheduled 10/14/2018 with Dr. Aundra Dubin.    Copy of ICM check sent to Dr. Lovena Le.   3 month ICM trend: 09/26/2018    1 Year ICM trend:       Rosalene Billings, RN 09/27/2018 2:40 PM

## 2018-10-14 ENCOUNTER — Ambulatory Visit (HOSPITAL_COMMUNITY)
Admission: RE | Admit: 2018-10-14 | Discharge: 2018-10-14 | Disposition: A | Discharge status: Home / Self Care | Payer: Medicare Other | Source: Ambulatory Visit | Attending: Cardiology | Admitting: Cardiology

## 2018-10-14 VITALS — BP 118/76 | HR 68 | Wt 177.8 lb

## 2018-10-14 DIAGNOSIS — Z79899 Other long term (current) drug therapy: Secondary | ICD-10-CM | POA: Diagnosis not present

## 2018-10-14 DIAGNOSIS — R05 Cough: Secondary | ICD-10-CM | POA: Insufficient documentation

## 2018-10-14 DIAGNOSIS — N184 Chronic kidney disease, stage 4 (severe): Secondary | ICD-10-CM | POA: Insufficient documentation

## 2018-10-14 DIAGNOSIS — I428 Other cardiomyopathies: Secondary | ICD-10-CM | POA: Insufficient documentation

## 2018-10-14 DIAGNOSIS — M109 Gout, unspecified: Secondary | ICD-10-CM | POA: Diagnosis not present

## 2018-10-14 DIAGNOSIS — I2510 Atherosclerotic heart disease of native coronary artery without angina pectoris: Secondary | ICD-10-CM | POA: Diagnosis not present

## 2018-10-14 DIAGNOSIS — J90 Pleural effusion, not elsewhere classified: Secondary | ICD-10-CM | POA: Insufficient documentation

## 2018-10-14 DIAGNOSIS — Z8249 Family history of ischemic heart disease and other diseases of the circulatory system: Secondary | ICD-10-CM | POA: Diagnosis not present

## 2018-10-14 DIAGNOSIS — G4733 Obstructive sleep apnea (adult) (pediatric): Secondary | ICD-10-CM | POA: Diagnosis not present

## 2018-10-14 DIAGNOSIS — Z95810 Presence of automatic (implantable) cardiac defibrillator: Secondary | ICD-10-CM | POA: Diagnosis not present

## 2018-10-14 DIAGNOSIS — I5022 Chronic systolic (congestive) heart failure: Secondary | ICD-10-CM | POA: Diagnosis not present

## 2018-10-14 DIAGNOSIS — I480 Paroxysmal atrial fibrillation: Secondary | ICD-10-CM | POA: Diagnosis not present

## 2018-10-14 LAB — BASIC METABOLIC PANEL
ANION GAP: 10 (ref 5–15)
BUN: 41 mg/dL — ABNORMAL HIGH (ref 8–23)
CALCIUM: 9.2 mg/dL (ref 8.9–10.3)
CO2: 25 mmol/L (ref 22–32)
Chloride: 107 mmol/L (ref 98–111)
Creatinine, Ser: 3.05 mg/dL — ABNORMAL HIGH (ref 0.61–1.24)
GFR calc Af Amer: 22 mL/min — ABNORMAL LOW (ref >60)
GFR calc non Af Amer: 19 mL/min — ABNORMAL LOW (ref >60)
Glucose, Bld: 107 mg/dL — ABNORMAL HIGH (ref 70–99)
Potassium: 3.5 mmol/L (ref 3.5–5.1)
SODIUM: 142 mmol/L (ref 135–145)

## 2018-10-14 LAB — DIGOXIN LEVEL: DIGOXIN LVL: 0.3 ng/mL — AB (ref 0.8–2.0)

## 2018-10-14 NOTE — Patient Instructions (Signed)
Today you have been seen at the Heart failure clinic at [facility] had Lab work done today:  We will call you if your lab work is abnormal.. No news is good news!!   Follow up with Dr. Aundra Dubin in 3 months

## 2018-10-14 NOTE — Progress Notes (Signed)
P  Advanced Heart Failure Clinic Note   Patient ID: Miguel Roach, male   DOB: 04/23/1941, 77 y.o.   MRN: [id number] PCP: Dr. Shelia Media Cardiology: Dr. Aundra Dubin   77 y.o. with history of chronic systolic CHF/nonischemic cardiomyopathy and CKD presents for followup of CHF. He actually had an echo back in 2010 with EF 40-45% at the time.  He says that this really was not followed up upon by a cardiologist and that he did well for a number of years after that. In 6/16, he was admitted with acute systolic CHF.  Echo showed EF down to 20-25%.  Cardiac cath showed nonobstructive CAD.  His Lasix has been gradually increased.  This has brought his weight down and improved his breathing, but creatinine has risen. He had been on olmesartan but this was stopped because he thought it was causing cough.  He was then put on losartan, but this was stopped with rise in creatinine. Also intolerant to Bidil with orthostatic symptoms.  He was started on digoxin.  Echo in 12/16 showed persistently low EF, 15-20%.  Medtronic ICD was placed in 2/17.  Repeat echo 1/18 showed EF 25% with diffuse hypokinesis.   In 3/18, he had an episode of about 10 minutes atrial fibrillation noted on his ICD.  He also had 1 episode of VT terminated by an appropriate discharge.  He had push-mowed his grass and was very fatigued when he had the shock.  Since then, he has had no further arrhythmias.   Echo in 6/19 showed EF 25-30% with mild LV dilation and mild-moderate AI, normal RV size with mildly decreased systolic function, IVC normal.   He returns today for HF follow up. He is doing well overall.  No significant exertional dyspnea.  Goes to the [facility] 4[facility] days/week. Feels good.  No chest pain.  No orthopnea/PND.  No lightheadedness currently but could not tolerate 18.75 mg bid Coreg and remains on 12.5 mg bid.  He was not able to tolerate increase in hydralazine either due to lightheadedness.   Labs (6/16): BNP 1954, TSH normal Labs (7/16): K  4.1, creatinine 1.72 Labs (8/16): creatinine 2.3 Labs (9/16): digoxin 0.3, K 4, creatinine 2.14, SPEP negative, UPEP negative Labs (10/16): K 4.6, creatinine 1.62 Labs (11/16): K 4.4, creatinine 2.33, digoxin 0.5 Labs (12/16): creatinine 2.1 (nephrology) Labs (1/17): digoxin 0.6, uric acid 10.4 Labs (2/17): K 4, creatinine 2.13, hgb 11.6 Labs (5/17): K 3.8, creatinine 2.22, digoxin 0.7 Labs (9/17): K 4.4, creatinine 2.88, LFTs normal, digoxin 1.0, hgb 11.8, LDL 88, HDL 50 Labs (1/18): digoxin 0.5 Labs (3/18): K 4.4, creatinine 3.17 Labs (4/18): K 4.3, creatinine 3.39, digoxin 0.6 Labs (10/18): K 4.5, creatinine 3.65, digoxin 0.4 Labs (11/18): K 4, creatinine 3.24, digoxin 0.6 Labs (12/18): digoxin < 0.2 Labs (1/19): K 3.8, creatinine 3.15 Labs (2/19): K 4.0, creatinine 3.23 Labs (3/19): digoxin 0.4, K 3.8, creatinine 3.19 Labs (6/19): digoxin 0.3, K 3.9, creatinine 3.12 Labs (9/19): K 3.9, creatinine 3.58, digoxin 0.3, LDL 91  PMH: 1. CKD: Stage IV.  Follows with Dr Posey Pronto.  2. Gout 3. Chronic systolic CHF: Nonischemic cardiomyopathy.  EF 40-45% in 2010.  Echo (6/16) with EF 20-25%, severe LV dilation, mild MR, mild AI, moderate LAE.  LHC/RHC (6/16) with nonobstructive CAD; mean RA 2, RV 48/1, PA 47/18, mean PCWP 12, CI 3.2.  CPX (10/16) with peak VO2 13.1 (50% predicted), VE/VCO2 slope 39.3, RER 1.22 => moderate to severe functional limitation.  Echo (12/16) with EF 15-20%,  severe LV dilation, diffuse hypokinesis, moderate AI, mild MR, normal RV size and systolic function. Medtronic ICD.  - CPX (11/17): Submaximal with RER 0.95, VE/VCO2 slope 27, peak VO2 18.2.  Suspect no more than mild HF limitation.  - Echo (1/18): EF 25%, diffuse hypokinesis, normal RV size and systolic function, mild AI.  - Echo (6/19): EF 25-30% with mild LV dilation and mild-moderate AI, normal RV size with mildly decreased systolic function, IVC normal.  4. ACEI cough.  5. Pleural effusion: Thoracentesis on  right in 8/16.  It was a transudate, likely due to CHF.  6. OSA: To start Bipap.  7. Esophageal duplication cyst.  8. High resolution CT chest 12/17: No evidence for interstitial lung disease.  9. Atrial fibrillation: Paroxysmal, 1 10 minute episode in 3/18.  10. VT: ICD discharge 3/18.   SH: Married, retired Pharmacist, hospital, never smoked, no ETOH.   FH: Mother with "weak heart." Brother with sickle cell anemia.   ROS: All systems reviewed and negative except as per HPI.   Current Outpatient Medications  Medication Sig Dispense Refill  . allopurinol (ZYLOPRIM) 100 MG tablet TAKE 2 TABLETS (200 MG TOTAL) BY MOUTH DAILY. (Patient taking differently: Take 150 mg by mouth daily. ) 180 tablet 3  . carvedilol (COREG) 12.5 MG tablet Take 1 tablet (12.5 mg total) by mouth 2 (two) times daily with a meal. 60 tablet 6  . Cholecalciferol (VITAMIN D3) 5000 units TABS Take by mouth.    . digoxin (LANOXIN) 0.125 MG tablet TAKE 0.5 TABLETS (0.0625 MG TOTAL) BY MOUTH EVERY OTHER DAY. 30 tablet 3  . ferric citrate (AURYXIA) 1 GM 210 MG(Fe) tablet Take 210 mg by mouth daily.    . furosemide (LASIX) 40 MG tablet Take 2 tabs (80 mg total) by mouth every morning and 1 tab (40 mg total) every evening alternating with 2 tabs (80 mg total) twice a day. 315 tablet 3  . hydrALAZINE (APRESOLINE) 25 MG tablet Take 1 tablet (25 mg total) by mouth 3 (three) times daily. 90 tablet 6  . isosorbide mononitrate (IMDUR) 30 MG 24 hr tablet TAKE 1 TABLET (30 MG TOTAL) BY MOUTH DAILY. 30 tablet 6  . isosorbide mononitrate (IMDUR) 30 MG 24 hr tablet TAKE 1 TABLET BY MOUTH EVERY DAY 90 tablet 1   No current facility-administered medications for this encounter.    Filed Weights   10/14/18 1101  Weight: 80.6 kg (177 lb 12.8 oz)   BP 118/76   Pulse 68   Wt 80.6 kg (177 lb 12.8 oz)   SpO2 94%   BMI 24.80 kg/m   General: NAD Neck: No JVD, no thyromegaly or thyroid nodule.  Lungs: Clear to auscultation bilaterally with normal  respiratory effort. CV: Nondisplaced PMI.  Heart regular S1/S2, no S3/S4, no murmur.  No peripheral edema.  No carotid bruit.  Normal pedal pulses.  Abdomen: Soft, nontender, no hepatosplenomegaly, no distention.  Skin: Intact without lesions or rashes.  Neurologic: Alert and oriented x 3.  Psych: Normal affect. Extremities: No clubbing or cyanosis.  HEENT: Normal.   Assessment/Plan: 1. Chronic systolic CHF: Nonischemic cardiomyopathy.  CO preserved on RHC in 6/16, but 10/16 CPX showed moderate to severe functional impairment (seems worse than his symptoms would suggest).  However, repeat CPX in 11/17, though submaximal, suggested no more than mild HF limitation. No significant CAD with coronary angiography in 6/16.  No hx heavy ETOH or drug abuse. No marked HTN.  Negative SPEP/UPEP.  Possible prior myocarditis.  ?  familial cardiomyopathy => mother had "weak heart" of uncertain etiology.  He now has Medtronic ICD.  Echo in 6/19 showed EF 25-30% (stable) with mild-moderate AI and mildly decreased RV systolic function.  NYHA class II.  He does not appear volume overloaded.  - Continue alternating Lasix 80 mg bid with Lasix 80 qam/40 qpm. BMET today - Off spironolactone with elevated creatinine.    - Continue Coreg 12.5 mg bid, does not tolerate increase due to lightheadedness. - Hold off on ACEI/ARB/Entresto at this time with elevated creatinine.  - Continue hydralazine 25 mg tid and Imdur 30 mg daily, he did not tolerate increase due to lightheadedness.   - Continue digoxin 0.0625 every other day, check digoxin level today.  Creatinine is high but digoxin level has been steady and not elevated.  2. CKD: Stage IV.  BMET today.  He follows with Dr Posey Pronto every 3 months 3. OSA: Bipap. 4. Gout: Gout controlled on allopurinol. 5. VT: Appropriate ICD discharge in 3/18, no recurrence.  - Continue coreg - Hold off on anti-arrhythmic unless VT recurs.  6. Atrial fibrillation: Paroxysmal.  Very short  episodes only noted by device interrogation. He has had no prolonged or symptomatic atrial fibrillation (data for anticoagulating short runs of atrial fibrillation is not strong), so will hold off on anticoagulation.  - If he develops prolonged or symptomatic atrial fibrillation, would anticoagulate.   Followup in 3 months.   [name] 10/14/2018

## 2018-10-26 LAB — CUP PACEART REMOTE DEVICE CHECK
Battery Remaining Longevity: 119 mo
Date Time Interrogation Session: 20191111051804
HighPow Impedance: 65 Ohm
Implantable Lead Implant Date: 20170208
Implantable Lead Location: 753860
Implantable Lead Model: 6935
Implantable Pulse Generator Implant Date: 20170208
Lead Channel Impedance Value: 304 Ohm
Lead Channel Impedance Value: 399 Ohm
Lead Channel Pacing Threshold Amplitude: 1.125 V
Lead Channel Pacing Threshold Pulse Width: 0.4 ms
Lead Channel Sensing Intrinsic Amplitude: 11 mV
Lead Channel Sensing Intrinsic Amplitude: 11 mV
Lead Channel Setting Pacing Pulse Width: 0.4 ms
Lead Channel Setting Sensing Sensitivity: 0.3 mV
MDC IDC MSMT BATTERY VOLTAGE: 3.02 V
MDC IDC SET LEADCHNL RV PACING AMPLITUDE: 2.25 V
MDC IDC STAT BRADY RV PERCENT PACED: 0.14 %

## 2018-10-31 ENCOUNTER — Ambulatory Visit (INDEPENDENT_AMBULATORY_CARE_PROVIDER_SITE_OTHER): Payer: Medicare Other

## 2018-10-31 DIAGNOSIS — Z95810 Presence of automatic (implantable) cardiac defibrillator: Secondary | ICD-10-CM | POA: Diagnosis not present

## 2018-10-31 DIAGNOSIS — I5022 Chronic systolic (congestive) heart failure: Secondary | ICD-10-CM

## 2018-10-31 NOTE — Progress Notes (Signed)
EPIC Encounter for ICM Monitoring  Patient Name: Miguel Roach is a 78 y.o. male Date: 10/31/2018 Primary Care Physican: Pharr, Walter, MD Primary Cardiologist:[place] Electrophysiologist: [place] Nephrologist: [place] [facility] - [place] Last Weight:168lbs Today's Weight: 173 lbs       Heart Failure questions reviewed.   Pt asymptomatic.   Report: Thoracic impedance normal.   Prescribed: Furosemide 40 mg take 2 tablets (80 mg total) bymouth every morningand 1 tablet (40 mg total)everyeveningalternating every other day with 80 mg twice a day.  Labs: 07/05/2018 Creatinine 3.58, BUN 58, Potassium 3.9, Sodium 142, eGFR 15-18 04/01/2018 Creatinine 3.12, BUN 42, Potassium 3.9, Sodium 144, EGFR 18-21 01/02/2018 Creatinine 3.19, BUN 42, Potassium 3.8, Sodium 140, EGFR 17-20 12/03/2017 Creatinine 3.23, BUN 44, Potassium 4.0, Sodium 145, EGFR 18-20 11/12/2017 Creatinine 3.15, BUN 44, Potassium 3.8, Sodium 141, EGFR 18-21  Recommendations: No changes.  Encouraged to call for fluid symptoms.  Follow-up plan: ICM clinic phone appointment on 12/02/2018.      Copy of ICM check sent to Dr. Taylor.   3 month ICM trend: 10/31/2018    1 Year ICM trend:       Laurie S Short, RN 10/31/2018 4:56 PM   

## 2018-11-09 ENCOUNTER — Other Ambulatory Visit (HOSPITAL_COMMUNITY): Payer: Self-pay | Admitting: Internal Medicine

## 2018-11-11 ENCOUNTER — Encounter (HOSPITAL_BASED_OUTPATIENT_CLINIC_OR_DEPARTMENT_OTHER): Payer: Self-pay | Admitting: *Deleted

## 2018-11-11 ENCOUNTER — Inpatient Hospital Stay (HOSPITAL_BASED_OUTPATIENT_CLINIC_OR_DEPARTMENT_OTHER)
Admission: EM | Admit: 2018-11-11 | Discharge: 2018-11-17 | DRG: 682 | Disposition: A | Discharge status: Home / Self Care | Payer: Medicare Other | Attending: Internal Medicine | Admitting: Internal Medicine

## 2018-11-11 ENCOUNTER — Other Ambulatory Visit: Payer: Self-pay

## 2018-11-11 ENCOUNTER — Emergency Department (HOSPITAL_BASED_OUTPATIENT_CLINIC_OR_DEPARTMENT_OTHER): Payer: Medicare Other

## 2018-11-11 DIAGNOSIS — I5023 Acute on chronic systolic (congestive) heart failure: Secondary | ICD-10-CM | POA: Diagnosis not present

## 2018-11-11 DIAGNOSIS — I959 Hypotension, unspecified: Secondary | ICD-10-CM | POA: Diagnosis present

## 2018-11-11 DIAGNOSIS — Z833 Family history of diabetes mellitus: Secondary | ICD-10-CM | POA: Diagnosis not present

## 2018-11-11 DIAGNOSIS — R319 Hematuria, unspecified: Secondary | ICD-10-CM | POA: Diagnosis present

## 2018-11-11 DIAGNOSIS — I472 Ventricular tachycardia: Secondary | ICD-10-CM | POA: Diagnosis present

## 2018-11-11 DIAGNOSIS — R110 Nausea: Secondary | ICD-10-CM | POA: Diagnosis present

## 2018-11-11 DIAGNOSIS — N170 Acute kidney failure with tubular necrosis: Principal | ICD-10-CM | POA: Diagnosis present

## 2018-11-11 DIAGNOSIS — G4730 Sleep apnea, unspecified: Secondary | ICD-10-CM | POA: Diagnosis present

## 2018-11-11 DIAGNOSIS — Z6822 Body mass index (BMI) 22.0-22.9, adult: Secondary | ICD-10-CM

## 2018-11-11 DIAGNOSIS — Z86010 Personal history of colonic polyps: Secondary | ICD-10-CM

## 2018-11-11 DIAGNOSIS — E870 Hyperosmolality and hypernatremia: Secondary | ICD-10-CM | POA: Diagnosis not present

## 2018-11-11 DIAGNOSIS — E861 Hypovolemia: Secondary | ICD-10-CM | POA: Diagnosis present

## 2018-11-11 DIAGNOSIS — K5790 Diverticulosis of intestine, part unspecified, without perforation or abscess without bleeding: Secondary | ICD-10-CM | POA: Diagnosis present

## 2018-11-11 DIAGNOSIS — N529 Male erectile dysfunction, unspecified: Secondary | ICD-10-CM | POA: Diagnosis present

## 2018-11-11 DIAGNOSIS — R6521 Severe sepsis with septic shock: Secondary | ICD-10-CM

## 2018-11-11 DIAGNOSIS — A419 Sepsis, unspecified organism: Secondary | ICD-10-CM | POA: Diagnosis present

## 2018-11-11 DIAGNOSIS — M109 Gout, unspecified: Secondary | ICD-10-CM | POA: Diagnosis not present

## 2018-11-11 DIAGNOSIS — E440 Moderate protein-calorie malnutrition: Secondary | ICD-10-CM | POA: Diagnosis present

## 2018-11-11 DIAGNOSIS — E860 Dehydration: Secondary | ICD-10-CM | POA: Diagnosis present

## 2018-11-11 DIAGNOSIS — I5022 Chronic systolic (congestive) heart failure: Secondary | ICD-10-CM

## 2018-11-11 DIAGNOSIS — I130 Hypertensive heart and chronic kidney disease with heart failure and stage 1 through stage 4 chronic kidney disease, or unspecified chronic kidney disease: Secondary | ICD-10-CM | POA: Diagnosis present

## 2018-11-11 DIAGNOSIS — D72829 Elevated white blood cell count, unspecified: Secondary | ICD-10-CM | POA: Diagnosis not present

## 2018-11-11 DIAGNOSIS — Z8249 Family history of ischemic heart disease and other diseases of the circulatory system: Secondary | ICD-10-CM

## 2018-11-11 DIAGNOSIS — Z8371 Family history of colonic polyps: Secondary | ICD-10-CM

## 2018-11-11 DIAGNOSIS — I480 Paroxysmal atrial fibrillation: Secondary | ICD-10-CM

## 2018-11-11 DIAGNOSIS — Z803 Family history of malignant neoplasm of breast: Secondary | ICD-10-CM

## 2018-11-11 DIAGNOSIS — D72828 Other elevated white blood cell count: Secondary | ICD-10-CM | POA: Diagnosis present

## 2018-11-11 DIAGNOSIS — I428 Other cardiomyopathies: Secondary | ICD-10-CM | POA: Diagnosis present

## 2018-11-11 DIAGNOSIS — G4733 Obstructive sleep apnea (adult) (pediatric): Secondary | ICD-10-CM | POA: Diagnosis present

## 2018-11-11 DIAGNOSIS — Z9089 Acquired absence of other organs: Secondary | ICD-10-CM

## 2018-11-11 DIAGNOSIS — Z95810 Presence of automatic (implantable) cardiac defibrillator: Secondary | ICD-10-CM

## 2018-11-11 DIAGNOSIS — N184 Chronic kidney disease, stage 4 (severe): Secondary | ICD-10-CM | POA: Diagnosis not present

## 2018-11-11 DIAGNOSIS — N179 Acute kidney failure, unspecified: Secondary | ICD-10-CM | POA: Diagnosis not present

## 2018-11-11 DIAGNOSIS — Z79899 Other long term (current) drug therapy: Secondary | ICD-10-CM

## 2018-11-11 DIAGNOSIS — Z832 Family history of diseases of the blood and blood-forming organs and certain disorders involving the immune mechanism: Secondary | ICD-10-CM | POA: Diagnosis not present

## 2018-11-11 LAB — COMPREHENSIVE METABOLIC PANEL
ALT: 37 U/L (ref 0–44)
AST: 31 U/L (ref 15–41)
Albumin: 2.9 g/dL — ABNORMAL LOW (ref 3.5–5.0)
Alkaline Phosphatase: 155 U/L — ABNORMAL HIGH (ref 38–126)
Anion gap: 16 — ABNORMAL HIGH (ref 5–15)
BUN: 112 mg/dL — ABNORMAL HIGH (ref 8–23)
CO2: 21 mmol/L — ABNORMAL LOW (ref 22–32)
Calcium: 9.2 mg/dL (ref 8.9–10.3)
Chloride: 105 mmol/L (ref 98–111)
Creatinine, Ser: 5.55 mg/dL — ABNORMAL HIGH (ref 0.61–1.24)
GFR calc non Af Amer: 9 mL/min — ABNORMAL LOW (ref >60)
GFR, EST AFRICAN AMERICAN: 11 mL/min — AB (ref >60)
Glucose, Bld: 127 mg/dL — ABNORMAL HIGH (ref 70–99)
Potassium: 3.6 mmol/L (ref 3.5–5.1)
Sodium: 142 mmol/L (ref 135–145)
Total Bilirubin: 2.4 mg/dL — ABNORMAL HIGH (ref 0.3–1.2)
Total Protein: 7.4 g/dL (ref 6.5–8.1)

## 2018-11-11 LAB — INFLUENZA PANEL BY PCR (TYPE A & B)
Influenza A By PCR: NEGATIVE
Influenza B By PCR: NEGATIVE

## 2018-11-11 LAB — CBC WITH DIFFERENTIAL/PLATELET
Abs Immature Granulocytes: 0.23 10*3/uL — ABNORMAL HIGH (ref 0.00–0.07)
Basophils Absolute: 0 10*3/uL (ref 0.0–0.1)
Basophils Relative: 0 %
Eosinophils Absolute: 0 10*3/uL (ref 0.0–0.5)
Eosinophils Relative: 0 %
HCT: 34 % — ABNORMAL LOW (ref 39.0–52.0)
Hemoglobin: 11.4 g/dL — ABNORMAL LOW (ref 13.0–17.0)
Immature Granulocytes: 1 %
Lymphocytes Relative: 3 %
Lymphs Abs: 0.6 10*3/uL — ABNORMAL LOW (ref 0.7–4.0)
MCH: 28.2 pg (ref 26.0–34.0)
MCHC: 33.5 g/dL (ref 30.0–36.0)
MCV: 84.2 fL (ref 80.0–100.0)
Monocytes Absolute: 1.2 10*3/uL — ABNORMAL HIGH (ref 0.1–1.0)
Monocytes Relative: 6 %
NEUTROS PCT: 90 %
NRBC: 0 % (ref 0.0–0.2)
Neutro Abs: 18.7 10*3/uL — ABNORMAL HIGH (ref 1.7–7.7)
PLATELETS: 269 10*3/uL (ref 150–400)
RBC: 4.04 MIL/uL — ABNORMAL LOW (ref 4.22–5.81)
RDW: 13.9 % (ref 11.5–15.5)
WBC: 20.7 10*3/uL — AB (ref 4.0–10.5)

## 2018-11-11 LAB — URINALYSIS, ROUTINE W REFLEX MICROSCOPIC
BILIRUBIN URINE: NEGATIVE
Glucose, UA: NEGATIVE mg/dL
Hgb urine dipstick: NEGATIVE
Ketones, ur: 15 mg/dL — AB
Leukocytes, UA: NEGATIVE
NITRITE: NEGATIVE
PH: 5.5 (ref 5.0–8.0)
Protein, ur: NEGATIVE mg/dL
Specific Gravity, Urine: 1.01 (ref 1.005–1.030)

## 2018-11-11 LAB — LACTIC ACID, PLASMA
Lactic Acid, Venous: 2.5 mmol/L (ref 0.5–1.9)
Lactic Acid, Venous: 1.4 mmol/L (ref 0.5–1.9)

## 2018-11-11 LAB — PROTIME-INR
INR: 1.34
Prothrombin Time: 16.5 seconds — ABNORMAL HIGH (ref 11.4–15.2)

## 2018-11-11 MED ORDER — SODIUM CHLORIDE 0.9 % IV SOLN
500.0000 mg | INTRAVENOUS | Status: DC
  Administered 2018-11-11 – 2018-11-12 (×2): 500 mg via INTRAVENOUS
  Filled 2018-11-11 (×3): qty 500

## 2018-11-11 MED ORDER — SODIUM CHLORIDE 0.9% FLUSH
3.0000 mL | Freq: Once | INTRAVENOUS | Status: DC
  Filled 2018-11-11: qty 3

## 2018-11-11 MED ORDER — SODIUM CHLORIDE 0.9 % IV BOLUS
500.0000 mL | Freq: Once | INTRAVENOUS | Status: AC
  Administered 2018-11-11: 500 mL via INTRAVENOUS

## 2018-11-11 MED ORDER — SODIUM CHLORIDE 0.9 % IV SOLN: Freq: Once | INTRAVENOUS | Status: AC

## 2018-11-11 MED ORDER — AZITHROMYCIN 500 MG IV SOLR
INTRAVENOUS | Status: AC
  Administered 2018-11-11: 500 mg
  Filled 2018-11-11: qty 500

## 2018-11-11 MED ORDER — SODIUM CHLORIDE 0.9 % IV BOLUS: 500.0000 mL | Freq: Once | INTRAVENOUS | Status: DC

## 2018-11-11 MED ORDER — NOREPINEPHRINE BITARTRATE 1 MG/ML IV SOLN
0.0000 ug/min | INTRAVENOUS | Status: DC
  Administered 2018-11-11: 10 ug/min via INTRAVENOUS
  Filled 2018-11-11: qty 4

## 2018-11-11 MED ORDER — ACETAMINOPHEN 500 MG PO TABS
1000.0000 mg | ORAL_TABLET | Freq: Once | ORAL | Status: AC
  Administered 2018-11-11: 1000 mg via ORAL
  Filled 2018-11-11: qty 2

## 2018-11-11 MED ORDER — SODIUM CHLORIDE 0.9 % IV SOLN
2.0000 g | INTRAVENOUS | Status: DC
  Administered 2018-11-11 – 2018-11-13 (×3): 2 g via INTRAVENOUS
  Filled 2018-11-11 (×4): qty 20

## 2018-11-11 MED ORDER — SODIUM CHLORIDE 0.9 % IV BOLUS
1000.0000 mL | Freq: Once | INTRAVENOUS | Status: AC
  Administered 2018-11-11: 1000 mL via INTRAVENOUS

## 2018-11-11 NOTE — ED Notes (Signed)
[facility] notified (Taryn) - patient ready for transport 

## 2018-11-11 NOTE — ED Provider Notes (Signed)
[facility] EMERGENCY DEPARTMENT Provider Note   CSN: [id number] Arrival date & time: 11/11/18  1711     History   Chief Complaint Chief Complaint  Patient presents with  . Fever  . Weakness    HPI Miguel Roach is a 78 y.o. male with history of CHF, hypertension, CKD stage III who presents with a 1 week history of generalized weakness, cough.  Patient has felt warm at home, but no documented fever.  Patient was seen by PCP and thought to potentially have a urinary tract infection.  Blood work was conducted as well and patient had an acute increase in his creatinine.  He was sent to his nephrologist.  Blood work was drawn again today for nephrology at [facility].  Patient denies any urinary symptoms, chest pain, shortness of breath, abdominal pain, vomiting.  He has had some intermittent nausea.  HPI  Past Medical History:  Diagnosis Date  . Adenomatous colon polyp 2011  . AICD (automatic cardioverter/defibrillator) present   . Anal fistula 2003  . Arthritis   . CHF (congestive heart failure) ([place])   . CRI (chronic renal insufficiency)    creatinine back to normal  . Diverticulosis   . ED (erectile dysfunction)   . Gout   . Headache   . Hypertension   . Internal hemorrhoids   . Liver lesion 2007  . Nonischemic cardiomyopathy ([place])    EF 20-25% by echo 2016, At that time 40% RCA stenosis, distal 35% circumflex stenosis  . Renal cyst 2007   "I'm not familiar with that, but I follow up on my creatinine because my kidney function is low." 09/2016  . Sleep apnea    "had test; never followed thru w/getting mask" (11/25/2015)    Patient Active Problem List   Diagnosis Date Noted  . Sepsis ([place]) 11/11/2018  . Esophageal mass   . Abnormal CT scan, esophagus   . Chronic systolic CHF (congestive heart failure) ([place]) 12/15/2015  . Gout 11/04/2015  . CKD (chronic kidney disease) stage 3, GFR 30-59 ml/min (HCC) 07/05/2015  . Pleural effusion on right   . Pleural  effusion 06/15/2015  . Chronic systolic heart failure ([place]) 05/20/2015  . Dyspnea 04/02/2015  . Congestive dilated cardiomyopathy ([place]) 04/02/2015  . Personal history of colonic polyps 09/02/2013  . Internal hemorrhoids 07/31/2013  . Rectal bleeding 07/31/2013    Past Surgical History:  Procedure Laterality Date  . ANAL FISTULOTOMY  01/2002  . CARDIAC CATHETERIZATION N/A 04/16/2015   Procedure: Right/Left Heart Cath and Coronary Angiography;  Surgeon: Belva Crome, MD;  Location: [facility];  Service: Cardiovascular;  Laterality: N/A;  . EP IMPLANTABLE DEVICE N/A 11/24/2015   Procedure: ICD Implant;  Surgeon: Evans Lance, MD;  Location: [facility];  Service: Cardiovascular;  Laterality: N/A;  . ESOPHAGOGASTRODUODENOSCOPY (EGD) WITH PROPOFOL N/A 10/02/2016   Procedure: ESOPHAGOGASTRODUODENOSCOPY (EGD) WITH PROPOFOL;  Surgeon: Ladene Artist, MD;  Location: [facility];  Service: Endoscopy;  Laterality: N/A;  . EUS N/A 10/19/2016   Procedure: UPPER ENDOSCOPIC ULTRASOUND (EUS) RADIAL;  Surgeon: Milus Banister, MD;  Location: [facility] ENDOSCOPY;  Service: Endoscopy;  Laterality: N/A;  . INSERTION OF ICD  11/24/2015  . TONSILLECTOMY  ~ 1950        Home Medications    Prior to Admission medications   Medication Sig Start Date End Date Taking? Authorizing Provider  allopurinol (ZYLOPRIM) 100 MG tablet TAKE 2 TABLETS (200 MG TOTAL) BY MOUTH DAILY. Patient  taking differently: Take 150 mg by mouth daily.  02/27/18   Larey Dresser, MD  carvedilol (COREG) 12.5 MG tablet Take 1 tablet (12.5 mg total) by mouth 2 (two) times daily with a meal. 07/05/18   Larey Dresser, MD  Cholecalciferol (VITAMIN D3) 5000 units TABS Take by mouth.    [provider]  digoxin (LANOXIN) 0.125 MG tablet TAKE 0.5 TABLETS (0.0625 MG TOTAL) BY MOUTH EVERY OTHER DAY. 04/17/18   Larey Dresser, MD  ferric citrate (AURYXIA) 1 GM 210 MG(Fe) tablet Take 210 mg by mouth daily.    [provider]  furosemide (LASIX) 40 MG tablet Take 2 tabs (80 mg total) by mouth every morning and 1 tab (40 mg total) every evening alternating with 2 tabs (80 mg total) twice a day. 01/02/18   Larey Dresser, MD  hydrALAZINE (APRESOLINE) 25 MG tablet Take 1 tablet (25 mg total) by mouth 3 (three) times daily. 07/05/18   Larey Dresser, MD  isosorbide mononitrate (IMDUR) 30 MG 24 hr tablet TAKE 1 TABLET (30 MG TOTAL) BY MOUTH DAILY. 07/06/17   Bensimhon, Shaune Pascal, MD  isosorbide mononitrate (IMDUR) 30 MG 24 hr tablet TAKE 1 TABLET BY MOUTH EVERY DAY 07/09/18   Bensimhon, Shaune Pascal, MD    Family History Family History  Problem Relation Age of Onset  . Heart failure Mother 52  . Sickle cell anemia Brother   . Breast cancer Sister   . Diabetes Sister   . Diabetes Father        died at age 74  . Diabetes Sister   . Colon polyps Brother   . Clotting disorder Other        two daughters (inherited from his first wife)    Social History Social History   Tobacco Use  . Smoking status: Never Smoker  . Smokeless tobacco: Never Used  . Tobacco comment: "smoked briefly in my freshman year in college; purchased 1  pack of cigarettes; didn't finish that"  Substance Use Topics  . Alcohol use: No    Alcohol/week: 0.0 standard drinks    Comment: 11/25/2015 "used to drink a beer q now in then back in my 20s"  . Drug use: No     Allergies   Benicar [olmesartan]   Review of Systems Review of Systems  Constitutional: Positive for chills, fatigue and fever.  HENT: Negative for facial swelling and sore throat.   Respiratory: Positive for cough. Negative for shortness of breath.   Cardiovascular: Negative for chest pain.  Gastrointestinal: Negative for abdominal pain, nausea and vomiting.  Genitourinary: Negative for dysuria.  Musculoskeletal: Negative for back pain.  Skin: Negative for rash and wound.  Neurological: Positive for weakness. Negative for headaches.  Psychiatric/Behavioral: The patient  is not nervous/anxious.      Physical Exam Updated Vital Signs BP (!) 87/56   Pulse 88   Temp 98.9 F (37.2 C) (Oral)   Resp (!) 22   Ht 5\' 11"  (1.803 m)   Wt 77.1 kg   SpO2 94%   BMI 23.71 kg/m   Physical Exam Vitals signs and nursing note reviewed.  Constitutional:      General: He is not in acute distress.    Appearance: He is well-developed. He is not diaphoretic.  HENT:     Head: Normocephalic and atraumatic.     Right Ear: Tympanic membrane normal.     Left Ear: Tympanic membrane normal.     Mouth/Throat:  Pharynx: No oropharyngeal exudate or posterior oropharyngeal erythema.  Eyes:     General: No scleral icterus.       Right eye: No discharge.        Left eye: No discharge.     Conjunctiva/sclera: Conjunctivae normal.     Pupils: Pupils are equal, round, and reactive to light.  Neck:     Musculoskeletal: Normal range of motion and neck supple.     Thyroid: No thyromegaly.  Cardiovascular:     Rate and Rhythm: Regular rhythm.     Heart sounds: Normal heart sounds. No murmur. No friction rub. No gallop.   Pulmonary:     Effort: Pulmonary effort is normal. No respiratory distress.     Breath sounds: No stridor. Rales (L>R) present. No wheezing.  Abdominal:     General: Bowel sounds are normal. There is no distension.     Palpations: Abdomen is soft.     Tenderness: There is no abdominal tenderness. There is no guarding or rebound.  Lymphadenopathy:     Cervical: No cervical adenopathy.  Skin:    General: Skin is warm and dry.     Coloration: Skin is not pale.     Findings: No rash.  Neurological:     Mental Status: He is alert.     Coordination: Coordination normal.      ED Treatments / Results  Labs (all labs ordered are listed, but only abnormal results are displayed) Labs Reviewed  COMPREHENSIVE METABOLIC PANEL - Abnormal; Notable for the following components:      Result Value   CO2 21 (*)    Glucose, Bld 127 (*)    BUN 112 (*)     Creatinine, Ser 5.55 (*)    Albumin 2.9 (*)    Alkaline Phosphatase 155 (*)    Total Bilirubin 2.4 (*)    GFR calc non Af Amer 9 (*)    GFR calc Af Amer 11 (*)    Anion gap 16 (*)    All other components within normal limits  LACTIC ACID, PLASMA - Abnormal; Notable for the following components:   Lactic Acid, Venous 2.5 (*)    All other components within normal limits  CBC WITH DIFFERENTIAL/PLATELET - Abnormal; Notable for the following components:   WBC 20.7 (*)    RBC 4.04 (*)    Hemoglobin 11.4 (*)    HCT 34.0 (*)    Neutro Abs 18.7 (*)    Lymphs Abs 0.6 (*)    Monocytes Absolute 1.2 (*)    Abs Immature Granulocytes 0.23 (*)    All other components within normal limits  PROTIME-INR - Abnormal; Notable for the following components:   Prothrombin Time 16.5 (*)    All other components within normal limits  URINALYSIS, ROUTINE W REFLEX MICROSCOPIC - Abnormal; Notable for the following components:   APPearance CLOUDY (*)    Ketones, ur 15 (*)    All other components within normal limits  CULTURE, BLOOD (ROUTINE X 2)  CULTURE, BLOOD (ROUTINE X 2)  URINE CULTURE  INFLUENZA PANEL BY PCR (TYPE A & B)  LACTIC ACID, PLASMA    EKG EKG Interpretation  Date/Time:  Monday November 11 2018 17:47:12 EST Ventricular Rate:  97 PR Interval:    QRS Duration: 122 QT Interval:  362 QTC Calculation: 460 R Axis:   -78 Text Interpretation:  Sinus rhythm Ventricular premature complex Nonspecific IVCD with LAD No significant change since last tracing Confirmed by Lennice Sites [phone]) on  11/11/2018 5:50:41 PM   Radiology Dg Chest 2 View  Result Date: 11/11/2018 CLINICAL DATA:  78 year old male weakness fever and decreased appetite EXAM: CHEST - 2 VIEW COMPARISON:  11/25/2015 FINDINGS: Cardiomediastinal silhouette unchanged in size and contour. Cardiac pacing device/AICD on left chest wall, unchanged. Linear opacities at the lung bases, similar to the comparison with blunting of the  costophrenic sulcus on the lateral view. No pneumothorax.  No confluent airspace disease. IMPRESSION: Chronic lung changes with emphysema and basilar scarring and no evidence of acute cardiopulmonary disease. Unchanged left-sided AICD Electronically Signed   By: Corrie Mckusick D.O.   On: 11/11/2018 18:13    Procedures .Critical Care Performed by: Frederica Kuster, PA-C Authorized by: Frederica Kuster, PA-C   Critical care provider statement:    Critical care time (minutes):  45   Critical care was necessary to treat or prevent imminent or life-threatening deterioration of the following conditions:  Dehydration, metabolic crisis, renal failure and sepsis   Critical care was time spent personally by me on the following activities:  Discussions with consultants, evaluation of patient's response to treatment, examination of patient, ordering and performing treatments and interventions, ordering and review of laboratory studies, ordering and review of radiographic studies, pulse oximetry, re-evaluation of patient's condition, obtaining history from patient or surrogate and review of old charts   I assumed direction of critical care for this patient from another provider in my specialty: yes     (including critical care time)  Medications Ordered in ED Medications  sodium chloride flush (NS) 0.9 % injection 3 mL (3 mLs Intravenous Not Given 11/11/18 1804)  cefTRIAXone (ROCEPHIN) 2 g in sodium chloride 0.9 % 100 mL IVPB ( Intravenous Stopped 11/11/18 1927)  azithromycin (ZITHROMAX) 500 mg in sodium chloride 0.9 % 250 mL IVPB (0 mg Intravenous Stopped 11/11/18 2000)  sodium chloride 0.9 % bolus 1,000 mL (1,000 mLs Intravenous New Bag/Given 11/11/18 1955)  acetaminophen (TYLENOL) tablet 1,000 mg (1,000 mg Oral Given 11/11/18 1822)  azithromycin (ZITHROMAX) 500 MG injection (500 mg  Given 11/11/18 1859)  sodium chloride 0.9 % bolus 500 mL (0 mLs Intravenous Stopped 11/11/18 1935)     Initial Impression /  Assessment and Plan / ED Course  I have reviewed the triage vital signs and the nursing notes.  Pertinent labs & imaging results that were available during my care of the patient were reviewed by me and considered in my medical decision making (see chart for details).     Patient presenting with sepsis.  Patient most likely with occult pneumonia.  He also has significant AKI; BUN 112, creatinine 5.55.  Anion gap 16.  Lactic acid 2.5.  UA is negative.  CBC shows WBC 20.7.  Azithromycin and Rocephin initiated.  Fluids initiated.  Chest x-ray shows chronic lung changes with emphysema and basilar scarring no evidence of acute cardiopulmonary disease.  Influenza panel pending.  Blood cultures and urine culture pending.  Dr. Ronnald Nian discussed patient case with [facility], Dr. Hal Hope, who accepts admission.  Appreciate their assistance with the patient.  Patient also evaluated by my attending, Dr. Ronnald Nian, who agreed with patient's management and agrees with plan.  Final Clinical Impressions(s) / ED Diagnoses   Final diagnoses:  Sepsis with acute renal failure and septic shock, due to unspecified organism, unspecified acute renal failure type [facility])    ED Discharge Orders    None       Frederica Kuster, PA-C 11/11/18 2015    Lennice Sites, DO  11/12/18 0102  

## 2018-11-11 NOTE — ED Notes (Signed)
ED Provider at bedside. Aware of lowering B/P

## 2018-11-11 NOTE — ED Notes (Signed)
Contacted Critical Care @ Dr. Sanjuana Kava request

## 2018-11-11 NOTE — ED Notes (Signed)
Pt awake alert, skin hot,dry. Pt has fever. Family at bedside. Placed in trendelenburg for low b/p MD aware.

## 2018-11-11 NOTE — ED Provider Notes (Signed)
Medical screening examination/treatment/procedure(s) were conducted as a shared visit with non-physician practitioner(s) and myself.  I personally evaluated the patient during the encounter. Briefly, the patient is a 78 y.o. male with history of heart failure, chronic kidney disease who presents the ED with fever, cough, sputum production.  Patient with fever, tachycardia upon arrival.  Code sepsis initiated.  Family member states that patient has had general weakness for the last several days.  Has had flulike symptoms during that time.  Saw primary care doctor when symptoms first began it was found to have worsening creatinine.  Patient has had decreased urinary output, some pain with urination.  Patient denies any abdominal pain, headache, neck stiffness.  Neurologically intact.  Doubt meningitis, doubt intra-abdominal process.  Likely viral process versus pneumonia.  Possible urinary tract infection.  Blood pressure in the 41P systolic however family member states that patient normally has a low blood pressure.  Patient overall is well-appearing.  Breath sounds are coarse throughout.  Chest x-ray showed no obvious pneumonia.  Urinalysis shows no sign of infection.  Patient with leukocytosis of 20. Lactic acid 2.5.  Kidney function is increased in the 5.5.  No other significant electrolyte abnormalities.  Liver function within normal limits.  Bilirubin mildly elevated.  However patient without any right upper quadrant abdominal pain.  Suspect likely viral process or occult pneumonia as patient clinically appears to have pneumonia.  Appears very dehydrated.  Patient empirically given IV Rocephin and a Zithromax.  Given IV fluid bolus.  Will admit to hospitalist for further sepsis work-up.  Hemodynamically stable.  Patient while awaiting transfer to [facility] developed hypotension at around 2030.  Patient was given additional saline boluses to complete 30 cc/kg.  Repeat lactic acid was normal.  Patient had  good mental status.  Good perfusion.  Foley catheter was placed and patient had made around 300 cc of urine.  Patient continued to be on room air.  Therefore decision was made to give him additional 500 cc bolus to complete a total of 3 L of fluids.  Patient continued to have some hypotension and was started on levo fed peripherally.  Patient upgraded to ICU.  To be admitted to Dr. Halford Chessman.  Patient titrated on IV Levophed peripherally, transport team arrived about an hour after Levophed was started.  Patient titrated to 20 mcg and had great improvement of his blood pressure.  Patient was 121/70.  We will down titrate Levophed.  Will defer central line at this time as transport team is here and patient has a ready bed.  Right upper extremity with no pain, good pulses.  Transported in critical condition but patient mentating well and now hemodynamically stable on IV Levophed.  .Critical Care Performed by: Lennice Sites, DO Authorized by: Lennice Sites, DO   Critical care provider statement:    Critical care time (minutes):  65   Critical care time was exclusive of:  Separately billable procedures and treating other patients and teaching time   Critical care was necessary to treat or prevent imminent or life-threatening deterioration of the following conditions:  Renal failure, shock and sepsis   Critical care was time spent personally by me on the following activities:  Development of treatment plan with patient or surrogate, discussions with consultants, discussions with primary provider, evaluation of patient's response to treatment, examination of patient, obtaining history from patient or surrogate, ordering and review of radiographic studies, ordering and performing treatments and interventions, ordering and review of laboratory studies, pulse oximetry,  re-evaluation of patient's condition and review of old charts   I assumed direction of critical care for this patient from another provider in my  specialty: no      This chart was dictated using voice recognition software.  Despite best efforts to proofread,  errors can occur which can change the documentation meaning.    EKG Interpretation  Date/Time:  Monday November 11 2018 17:47:12 EST Ventricular Rate:  97 PR Interval:    QRS Duration: 122 QT Interval:  362 QTC Calculation: 460 R Axis:   -78 Text Interpretation:  Sinus rhythm Ventricular premature complex Nonspecific IVCD with LAD No significant change since last tracing Confirmed by Lennice Sites [phone]) on 11/11/2018 5:50:41 PM           Lennice Sites, DO 11/11/18 2335

## 2018-11-11 NOTE — ED Triage Notes (Signed)
Fever, no appetite and weakness for 10 days.

## 2018-11-11 NOTE — ED Notes (Signed)
ED Provider at bedside. 

## 2018-11-11 NOTE — ED Notes (Signed)
Pt alert, denies pain, skin mildly clammy. Denies pain.

## 2018-11-11 NOTE — ED Notes (Signed)
PT alert, skin warm and dry. Denies pain.

## 2018-11-11 NOTE — ED Notes (Signed)
Rate to 10MCg of Levophed per MD.

## 2018-11-12 DIAGNOSIS — A419 Sepsis, unspecified organism: Secondary | ICD-10-CM

## 2018-11-12 DIAGNOSIS — R6521 Severe sepsis with septic shock: Secondary | ICD-10-CM

## 2018-11-12 LAB — BLOOD CULTURE ID PANEL (REFLEXED)
Acinetobacter baumannii: NOT DETECTED
Candida albicans: NOT DETECTED
Candida glabrata: NOT DETECTED
Candida krusei: NOT DETECTED
Candida parapsilosis: NOT DETECTED
Candida tropicalis: NOT DETECTED
ENTEROCOCCUS SPECIES: NOT DETECTED
Enterobacter cloacae complex: NOT DETECTED
Enterobacteriaceae species: NOT DETECTED
Escherichia coli: NOT DETECTED
HAEMOPHILUS INFLUENZAE: NOT DETECTED
Klebsiella oxytoca: NOT DETECTED
Klebsiella pneumoniae: NOT DETECTED
Listeria monocytogenes: NOT DETECTED
Methicillin resistance: NOT DETECTED
Neisseria meningitidis: NOT DETECTED
Proteus species: NOT DETECTED
Pseudomonas aeruginosa: NOT DETECTED
Serratia marcescens: NOT DETECTED
Staphylococcus aureus (BCID): NOT DETECTED
Staphylococcus species: DETECTED — AB
Streptococcus agalactiae: NOT DETECTED
Streptococcus pneumoniae: NOT DETECTED
Streptococcus pyogenes: NOT DETECTED
Streptococcus species: NOT DETECTED

## 2018-11-12 LAB — POCT I-STAT 7, (LYTES, BLD GAS, ICA,H+H)
Acid-Base Excess: 6 mmol/L — ABNORMAL HIGH (ref 0.0–2.0)
Bicarbonate: 34 mmol/L — ABNORMAL HIGH (ref 20.0–28.0)
Calcium, Ion: 1.24 mmol/L (ref 1.15–1.40)
HCT: 35 % — ABNORMAL LOW (ref 39.0–52.0)
Hemoglobin: 11.9 g/dL — ABNORMAL LOW (ref 13.0–17.0)
O2 Saturation: 92 %
Potassium: 3.5 mmol/L (ref 3.5–5.1)
Sodium: 133 mmol/L — ABNORMAL LOW (ref 135–145)
TCO2: 36 mmol/L — ABNORMAL HIGH (ref 22–32)
pCO2 arterial: 66.2 mmHg (ref 32.0–48.0)
pH, Arterial: 7.318 — ABNORMAL LOW (ref 7.350–7.450)
pO2, Arterial: 73 mmHg — ABNORMAL LOW (ref 83.0–108.0)

## 2018-11-12 LAB — BASIC METABOLIC PANEL
Anion gap: 11 (ref 5–15)
BUN: 98 mg/dL — ABNORMAL HIGH (ref 8–23)
CO2: 19 mmol/L — ABNORMAL LOW (ref 22–32)
Calcium: 8.4 mg/dL — ABNORMAL LOW (ref 8.9–10.3)
Chloride: 114 mmol/L — ABNORMAL HIGH (ref 98–111)
Creatinine, Ser: 4.96 mg/dL — ABNORMAL HIGH (ref 0.61–1.24)
GFR calc Af Amer: 12 mL/min — ABNORMAL LOW (ref >60)
GFR calc non Af Amer: 10 mL/min — ABNORMAL LOW (ref >60)
Glucose, Bld: 141 mg/dL — ABNORMAL HIGH (ref 70–99)
Potassium: 3.5 mmol/L (ref 3.5–5.1)
Sodium: 144 mmol/L (ref 135–145)

## 2018-11-12 LAB — RESPIRATORY PANEL BY PCR
Adenovirus: NOT DETECTED
Bordetella pertussis: NOT DETECTED
Chlamydophila pneumoniae: NOT DETECTED
Coronavirus 229E: NOT DETECTED
Coronavirus HKU1: NOT DETECTED
Coronavirus NL63: NOT DETECTED
Coronavirus OC43: NOT DETECTED
Influenza A: NOT DETECTED
Influenza B: NOT DETECTED
Metapneumovirus: NOT DETECTED
Mycoplasma pneumoniae: NOT DETECTED
PARAINFLUENZA VIRUS 1-RVPPCR: NOT DETECTED
Parainfluenza Virus 2: NOT DETECTED
Parainfluenza Virus 3: NOT DETECTED
Parainfluenza Virus 4: NOT DETECTED
Respiratory Syncytial Virus: NOT DETECTED
Rhinovirus / Enterovirus: NOT DETECTED

## 2018-11-12 LAB — CBC
HEMATOCRIT: 26.4 % — AB (ref 39.0–52.0)
HEMOGLOBIN: 9.2 g/dL — AB (ref 13.0–17.0)
MCH: 28.8 pg (ref 26.0–34.0)
MCHC: 34.8 g/dL (ref 30.0–36.0)
MCV: 82.8 fL (ref 80.0–100.0)
Platelets: 230 10*3/uL (ref 150–400)
RBC: 3.19 MIL/uL — ABNORMAL LOW (ref 4.22–5.81)
RDW: 13.9 % (ref 11.5–15.5)
WBC: 21.7 10*3/uL — ABNORMAL HIGH (ref 4.0–10.5)
nRBC: 0 % (ref 0.0–0.2)

## 2018-11-12 LAB — MRSA PCR SCREENING: MRSA by PCR: NEGATIVE

## 2018-11-12 LAB — PHOSPHORUS: PHOSPHORUS: 4.3 mg/dL (ref 2.5–4.6)

## 2018-11-12 LAB — GLUCOSE, CAPILLARY: GLUCOSE-CAPILLARY: 170 mg/dL — AB (ref 70–99)

## 2018-11-12 LAB — DIGOXIN LEVEL: Digoxin Level: <0.2 ng/mL — ABNORMAL LOW (ref 0.8–2.0)

## 2018-11-12 LAB — MAGNESIUM: Magnesium: 2.4 mg/dL (ref 1.7–2.4)

## 2018-11-12 MED ORDER — NOREPINEPHRINE-SODIUM CHLORIDE 4-0.9 MG/250ML-% IV SOLN
INTRAVENOUS | Status: AC
  Administered 2018-11-12: 15 mg
  Filled 2018-11-12: qty 250

## 2018-11-12 MED ORDER — HEPARIN SODIUM (PORCINE) 5000 UNIT/ML IJ SOLN
5000.0000 [IU] | Freq: Three times a day (TID) | INTRAMUSCULAR | Status: DC
  Administered 2018-11-12 – 2018-11-17 (×15): 5000 [IU] via SUBCUTANEOUS
  Filled 2018-11-12 (×17): qty 1

## 2018-11-12 MED ORDER — LACTATED RINGERS IV BOLUS
500.0000 mL | Freq: Once | INTRAVENOUS | Status: AC
  Administered 2018-11-12: 500 mL via INTRAVENOUS

## 2018-11-12 MED ORDER — NOREPINEPHRINE-SODIUM CHLORIDE 4-0.9 MG/250ML-% IV SOLN
2.0000 ug/min | INTRAVENOUS | Status: DC
  Administered 2018-11-12 (×2): 4 ug/min via INTRAVENOUS
  Filled 2018-11-12 (×2): qty 250

## 2018-11-12 MED ORDER — SODIUM CHLORIDE 0.9 % IV SOLN
250.0000 mL | INTRAVENOUS | Status: DC
  Administered 2018-11-12: 250 mL via INTRAVENOUS

## 2018-11-12 NOTE — Progress Notes (Signed)
PHARMACY - PHYSICIAN COMMUNICATION CRITICAL VALUE ALERT - BLOOD CULTURE IDENTIFICATION (BCID)  Miguel Roach is an 78 y.o. male who presented to [facility] on 11/11/2018 with a chief complaint of septic shock.   Assessment:  1 out of 4 Blood cultures are aerobic GPC and BCID is showing staphylococcus species (methicillin sensitive). Respiratory panel is negative. MRSA pcr is negative. Urine culture is pending. Source of infection is suspected CAP. WBC is elevated and patient remains on pressors.  Name of physician (or Provider) Contacted: Dr. Loanne Drilling  Current antibiotics: Empiric Ceftriaxone + Azithromycin  Changes to prescribed antibiotics recommended:  Patient is on recommended antibiotics - No changes needed  Results for orders placed or performed during the hospital encounter of 11/11/18  Blood Culture ID Panel (Reflexed) (Collected: 11/11/2018  5:45 PM)  Result Value Ref Range   Enterococcus species NOT DETECTED NOT DETECTED   Listeria monocytogenes NOT DETECTED NOT DETECTED   Staphylococcus species DETECTED (A) NOT DETECTED   Staphylococcus aureus (BCID) NOT DETECTED NOT DETECTED   Methicillin resistance NOT DETECTED NOT DETECTED   Streptococcus species NOT DETECTED NOT DETECTED   Streptococcus agalactiae NOT DETECTED NOT DETECTED   Streptococcus pneumoniae NOT DETECTED NOT DETECTED   Streptococcus pyogenes NOT DETECTED NOT DETECTED   Acinetobacter baumannii NOT DETECTED NOT DETECTED   Enterobacteriaceae species NOT DETECTED NOT DETECTED   Enterobacter cloacae complex NOT DETECTED NOT DETECTED   Escherichia coli NOT DETECTED NOT DETECTED   Klebsiella oxytoca NOT DETECTED NOT DETECTED   Klebsiella pneumoniae NOT DETECTED NOT DETECTED   Proteus species NOT DETECTED NOT DETECTED   Serratia marcescens NOT DETECTED NOT DETECTED   Haemophilus influenzae NOT DETECTED NOT DETECTED   Neisseria meningitidis NOT DETECTED NOT DETECTED   Pseudomonas aeruginosa NOT DETECTED NOT DETECTED    Candida albicans NOT DETECTED NOT DETECTED   Candida glabrata NOT DETECTED NOT DETECTED   Candida krusei NOT DETECTED NOT DETECTED   Candida parapsilosis NOT DETECTED NOT DETECTED   Candida tropicalis NOT DETECTED NOT DETECTED    Brain Hilts 11/12/2018  5:59 PM

## 2018-11-12 NOTE — Progress Notes (Signed)
NAME:  Miguel Roach, MRN:  [id number], DOB:  10-08-1941, LOS: 1 ADMISSION DATE:  11/11/2018, CONSULTATION DATE:  1/28 REFERRING MD:  Dr. Ronnald Nian EDP [facility], CHIEF COMPLAINT:  Shock   Brief History   78 year old male admitted with septic shock on pressors.   History of present illness   78 year old male with PMH as below, which is significant for CKD, HFrEF (25%), AICD in place, OSA, and hypertension. He presented to [facility] 1/27 with complaints of weakness and cough x 1 week. He [facility] a [facility] hospital a few weeks ago and thinks he may have picked something up there. He jokingly tells me he has not been to [place]. Cough has been productive of only minimal clear sputum. He reports his appetite and PO intake have been poor as he has had some nausea after eating, but has not vomited. Hematuria also noted. He developed fever on 1/27 and for that reason he presented to [facility].  Upon arrival to the emergency department he was noted to by hypotensive, and with his complaints he was treated as a code sepsis and given IVF bolus. Workup did not demonstrate and clear infectious source. He was started on empiric antibiotics. Despite IVF bolus (3L) he remained hypotensive and was started on norepinephrine infusion. For this reason he was transferred to ICU at [facility] under PCCM.   Laboratory evaluation in the ED significant for WBC 20.7, Hgb 11.4, Creatinine 5.5, BUN 112, Lactic acid 2.85m which cleared to 1.4. UA negative.   Past Medical History   has a past medical history of Adenomatous colon polyp (2011), AICD (automatic cardioverter/defibrillator) present, Anal fistula (2003), Arthritis, CHF (congestive heart failure) ([place]), CRI (chronic renal insufficiency), Diverticulosis, ED (erectile dysfunction), Gout, Headache, Hypertension, Internal hemorrhoids, Liver lesion (2007), Nonischemic cardiomyopathy ([place]), Renal cyst (2007), and Sleep apnea.  Significant Hospital Events   1/27  admit  Consults:    Procedures:    Significant Diagnostic Tests:    Micro Data:  Blood 1/28 > Urine 1/28 > RVP 1/28 >  Antimicrobials:  Ceftriaxone 1/28 > Azithromycin 1/28 >  Interim history/subjective:  Patient was feeling better this morning.  Continue to require low-dose pressor. Feeling hungry and asking for food.  Objective   Blood pressure 103/71, pulse 72, temperature (!) 97.5 F (36.4 C), temperature source Oral, resp. rate (!) 23, height 5\' 11"  (1.803 m), weight 74.5 kg, SpO2 100 %.        Intake/Output Summary (Last 24 hours) at 11/12/2018 1325 Last data filed at 11/12/2018 1200 Gross per 24 hour  Intake 3322.82 ml  Output 1745 ml  Net 1577.82 ml   Filed Weights   11/11/18 1723 11/12/18 0023 11/12/18 0500  Weight: 77.1 kg 74.5 kg 74.5 kg    Examination: General: thin elderly male, in no acute distress. HENT: [place]/AT, PERRL, no JVD. Mucous membranes appear dry Lungs: Clear bilateral breath sounds, normal work of breathing. Cardiovascular: RRR, no MRG Abdomen: Soft, non-tender, non-distended Extremities: No lower extremity edema, pulses intact and symmetrical. Neuro: alert, oriented, non-focal GU: Good urine production.   Resolved Hospital Problem list     Assessment & Plan:   Shock: likely septic, however cardiogenic component cannot be ruled out. Suspect he also remains somewhat hypovolemic based on his dry MM and thirst.  No clear infectious source. Lactic acid has cleared to 1.4 after initial 2.5.  Continue to require low-dose Levophed at 4. Respiratory viral panel negative, blood cultures negative so far. -  Give LR 500 cc bolus. - Continue norepinephrine for MAP goal 41mmHg.  We will wean as tolerated. - Continue empiric antibiotics as above - Can likely tolerate more volume, but will need to be gentle due to to his CHF. - Strict I&O - Follow WBC and fever curve  Acute kidney injury with history of CKD. Baseline creatinine about 3.5 per  patient. Now  4.96, mild improvement since  admission.  - Follow BMP -Avoid nephrotoxic drugs.  Chronic HFrEF (25-30%): seems volume down. Most recent echo from June. Digoxin levels undetectable. - Strict I&O - Take care with further IVF.  - Holding home coreg, digoxin, lasix, imdur, hydralazine  OSA not on CPAP - Avoid sedating medications  Best practice:  Diet: NPO -heart healthy Pain/Anxiety/Delirium protocol (if indicated): Na VAP protocol (if indicated): Na DVT prophylaxis: heparin GI prophylaxis: PPI Glucose control: na Mobility: BR Code Status: Full Family Communication: Patient and wife updated.  Disposition: ICU  Labs   CBC: Recent Labs  Lab 11/11/18 1741 11/12/18 0311 11/12/18 0354  WBC 20.7*  --  21.7*  NEUTROABS 18.7*  --   --   HGB 11.4* 11.9* 9.2*  HCT 34.0* 35.0* 26.4*  MCV 84.2  --  82.8  PLT 269  --  379    Basic Metabolic Panel: Recent Labs  Lab 11/11/18 1741 11/12/18 0311 11/12/18 0354  NA 142 133* 144  K 3.6 3.5 3.5  CL 105  --  114*  CO2 21*  --  19*  GLUCOSE 127*  --  141*  BUN 112*  --  98*  CREATININE 5.55*  --  4.96*  CALCIUM 9.2  --  8.4*  MG  --   --  2.4  PHOS  --   --  4.3   GFR: Estimated Creatinine Clearance: 13.1 mL/min (A) (by C-G formula based on SCr of 4.96 mg/dL (H)). Recent Labs  Lab 11/11/18 1741 11/11/18 2039 11/12/18 0354  WBC 20.7*  --  21.7*  LATICACIDVEN 2.5* 1.4  --     Liver Function Tests: Recent Labs  Lab 11/11/18 1741  AST 31  ALT 37  ALKPHOS 155*  BILITOT 2.4*  PROT 7.4  ALBUMIN 2.9*   No results for input(s): LIPASE, AMYLASE in the last 168 hours. No results for input(s): AMMONIA in the last 168 hours.  ABG    Component Value Date/Time   PHART 7.318 (L) 11/12/2018 0311   PCO2ART 66.2 (HH) 11/12/2018 0311   PO2ART 73.0 (L) 11/12/2018 0311   HCO3 34.0 (H) 11/12/2018 0311   TCO2 36 (H) 11/12/2018 0311   ACIDBASEDEF 3.0 (H) 04/16/2015 1305   O2SAT 92.0 11/12/2018 0311      Coagulation Profile: Recent Labs  Lab 11/11/18 1741  INR 1.34    Cardiac Enzymes: No results for input(s): CKTOTAL, CKMB, CKMBINDEX, TROPONINI in the last 168 hours.  HbA1C: No results found for: HGBA1C  CBG: Recent Labs  Lab 11/12/18 0044  GLUCAP 170*    Review of Systems:   Bolds are positive  Constitutional: weight loss, gain, night sweats, Fevers, chills, fatigue .  HEENT: headaches, Sore throat, sneezing, nasal congestion, post nasal drip, Difficulty swallowing, Tooth/dental problems, visual complaints visual changes, ear ache CV:  chest pain, radiates:,Orthopnea, PND, swelling in lower extremities, dizziness, palpitations, syncope.  GI  heartburn, indigestion, abdominal pain, nausea, vomiting, diarrhea, change in bowel habits, loss of appetite, bloody stools.  Resp: cough, productive: of clear sputum, hemoptysis, dyspnea, chest pain, pleuritic.  Skin: rash or itching  or icterus GU: dysuria, change in color of urine, urgency or frequency. flank pain, hematuria  MS: joint pain or swelling. decreased range of motion  Psych: change in mood or affect. depression or anxiety.  Neuro: difficulty with speech, weakness, numbness, ataxia    Past Medical History  He,  has a past medical history of Adenomatous colon polyp (2011), AICD (automatic cardioverter/defibrillator) present, Anal fistula (2003), Arthritis, CHF (congestive heart failure) ([place]), CRI (chronic renal insufficiency), Diverticulosis, ED (erectile dysfunction), Gout, Headache, Hypertension, Internal hemorrhoids, Liver lesion (2007), Nonischemic cardiomyopathy ([place]), Renal cyst (2007), and Sleep apnea.   Surgical History    Past Surgical History:  Procedure Laterality Date  . ANAL FISTULOTOMY  01/2002  . CARDIAC CATHETERIZATION N/A 04/16/2015   Procedure: Right/Left Heart Cath and Coronary Angiography;  Surgeon: Belva Crome, MD;  Location: [place] [facility];  Service: Cardiovascular;  Laterality: N/A;  . EP  IMPLANTABLE DEVICE N/A 11/24/2015   Procedure: ICD Implant;  Surgeon: Evans Lance, MD;  Location: [place] [facility];  Service: Cardiovascular;  Laterality: N/A;  . ESOPHAGOGASTRODUODENOSCOPY (EGD) WITH PROPOFOL N/A 10/02/2016   Procedure: ESOPHAGOGASTRODUODENOSCOPY (EGD) WITH PROPOFOL;  Surgeon: Ladene Artist, MD;  Location: [facility];  Service: Endoscopy;  Laterality: N/A;  . EUS N/A 10/19/2016   Procedure: UPPER ENDOSCOPIC ULTRASOUND (EUS) RADIAL;  Surgeon: Milus Banister, MD;  Location: [facility];  Service: Endoscopy;  Laterality: N/A;  . INSERTION OF ICD  11/24/2015  . TONSILLECTOMY  ~ 41     Social History   reports that he has never smoked. He has never used smokeless tobacco. He reports that he does not drink alcohol or use drugs.   Family History   His family history includes Breast cancer in his sister; Clotting disorder in an other family member; Colon polyps in his brother; Diabetes in his father, sister, and sister; Heart failure (age of onset: 64) in his mother; Sickle cell anemia in his brother.   Allergies Allergies  Allergen Reactions  . Benicar [Olmesartan] Cough     Home Medications  Prior to Admission medications   Medication Sig Start Date End Date Taking? Authorizing Provider  allopurinol (ZYLOPRIM) 100 MG tablet TAKE 2 TABLETS (200 MG TOTAL) BY MOUTH DAILY. Patient taking differently: Take 150 mg by mouth daily.  02/27/18   Larey Dresser, MD  carvedilol (COREG) 12.5 MG tablet Take 1 tablet (12.5 mg total) by mouth 2 (two) times daily with a meal. 07/05/18   Larey Dresser, MD  Cholecalciferol (VITAMIN D3) 5000 units TABS Take by mouth.    [provider]  digoxin (LANOXIN) 0.125 MG tablet TAKE 0.5 TABLETS (0.0625 MG TOTAL) BY MOUTH EVERY OTHER DAY. 04/17/18   Larey Dresser, MD  ferric citrate (AURYXIA) 1 GM 210 MG(Fe) tablet Take 210 mg by mouth daily.    [provider]  furosemide (LASIX) 40 MG tablet Take 2 tabs (80 mg total) by  mouth every morning and 1 tab (40 mg total) every evening alternating with 2 tabs (80 mg total) twice a day. 01/02/18   Larey Dresser, MD  hydrALAZINE (APRESOLINE) 25 MG tablet Take 1 tablet (25 mg total) by mouth 3 (three) times daily. 07/05/18   Larey Dresser, MD  isosorbide mononitrate (IMDUR) 30 MG 24 hr tablet TAKE 1 TABLET (30 MG TOTAL) BY MOUTH DAILY. 07/06/17   Bensimhon, Shaune Pascal, MD  isosorbide mononitrate (IMDUR) 30 MG 24 hr tablet TAKE 1 TABLET BY MOUTH EVERY DAY  07/09/18   Bensimhon, Shaune Pascal, MD         Lorella Nimrod MD PGY3 Pager [phone] 11/12/2018 1:25 PM

## 2018-11-12 NOTE — Care Management (Signed)
S: Reports feeling overall better. Denies headaches, dizziness.  O: Blood pressure 107/69, pulse 63, temperature (!) 97.5 F (36.4 C), temperature source Oral, resp. rate 20, height 5\' 11"  (1.803 m), weight 74.5 kg, SpO2 100 %.    Intake/Output from previous day:  Intake/Output Summary (Last 24 hours) at 11/12/2018 1227 Last data filed at 11/12/2018 1100 Gross per 24 hour  Intake 3322.82 ml  Output 1145 ml  Net 2177.82 ml    On my exam: Thin male laying in bed, no acute distress. Lungs with clear breath sounds bilaterally without wheezing or rhonchi. Cardiac exam with RRR no MGR. Trace pedal edema, warm to touch.  Interim Data: Lab Results  Component Value Date   WBC 21.7 (H) 11/12/2018   HGB 9.2 (L) 11/12/2018   HCT 26.4 (L) 11/12/2018   MCV 82.8 11/12/2018   PLT 230 11/12/2018   Lab Results  Component Value Date   NA 144 11/12/2018   K 3.5 11/12/2018   CL 114 (H) 11/12/2018   CO2 19 (L) 11/12/2018   GLUCOSE 141 (H) 11/12/2018   BUN 98 (H) 11/12/2018   CREATININE 4.96 (H) 11/12/2018   CALCIUM 8.4 (L) 11/12/2018    RVP negative  CXR 1/27 No infiltrate, edema or effusion Independently reviewed and interpreted by me.  Impression/Plan:  Septic shock suspected to infectious source, probable respiratory - Continue pressor support for MAP goal >65 - LR 500cc bolus - Continue CAP coverage - Follow-up cultures - LA resolved. No further trend  AKI  - Secondary to ATN - UOP adequate - Serial BMP  Chronic HFrEF (25-30%) - Hold anti-hypertensive medications  Independent Critical Care Time devoted to patient care services described in this note is  35 minutes.   Rodman Pickle, M.D. [facility] Pulmonary/Critical Care Medicine Pager: [phone] After hours pager: [phone]

## 2018-11-12 NOTE — H&P (Signed)
NAME:  Miguel Roach, MRN:  [id number], DOB:  1940-10-30, LOS: 1 ADMISSION DATE:  11/11/2018, CONSULTATION DATE:  1/28 REFERRING MD:  Dr. Ronnald Nian EDP [facility], CHIEF COMPLAINT:  Shock   Brief History   78 year old male admitted with septic shock on pressors.   History of present illness   78 year old male with PMH as below, which is significant for CKD, HFrEF (25%), AICD in place, OSA, and hypertension. He presented to [facility] 1/27 with complaints of weakness and cough x 1 week. He spent some time in a childrens hospital a few weeks ago and thinks he may have picked something up there. He jokingly tells me he has not been to [place]. Cough has been productive of only minimal clear sputum. He reports his appetite and PO intake have been poor as he has had some nausea after eating, but has not vomited. Hematuria also noted. He developed fever on 1/27 and for that reason he presented to [facility].  Upon arrival to the emergency department he was noted to by hypotensive, and with his complaints he was treated as a code sepsis and given IVF bolus. Workup did not demonstrate and clear infectious source. He was started on empiric antibiotics. Despite IVF bolus (3L) he remained hypotensive and was started on norepinephrine infusion. For this reason he was transferred to ICU at [facility] under PCCM.   Laboratory evaluation in the ED significant for WBC 20.7, Hgb 11.4, Creatinine 5.5, BUN 112, Lactic acid 2.29m which cleared to 1.4. UA negative.   Past Medical History   has a past medical history of Adenomatous colon polyp (2011), AICD (automatic cardioverter/defibrillator) present, Anal fistula (2003), Arthritis, CHF (congestive heart failure) ([place]), CRI (chronic renal insufficiency), Diverticulosis, ED (erectile dysfunction), Gout, Headache, Hypertension, Internal hemorrhoids, Liver lesion (2007), Nonischemic cardiomyopathy ([place]), Renal cyst (2007), and Sleep apnea.  Significant Hospital Events   1/27  admit  Consults:    Procedures:    Significant Diagnostic Tests:    Micro Data:  Blood 1/28 > Urine 1/28 > RVP 1/28 >  Antimicrobials:  Ceftriaxone 1/28 > Azithromycin 1/28 >  Interim history/subjective:    Objective   Blood pressure 121/70, pulse 85, temperature 98.3 F (36.8 C), temperature source Oral, resp. rate (!) 21, height 5\' 11"  (1.803 m), weight 74.5 kg, SpO2 99 %.        Intake/Output Summary (Last 24 hours) at 11/12/2018 0049 Last data filed at 11/11/2018 2319 Gross per 24 hour  Intake 2590.84 ml  Output 350 ml  Net 2240.84 ml   Filed Weights   11/11/18 1723 11/12/18 0023  Weight: 77.1 kg 74.5 kg    Examination: General: thin elderly male HENT: [place]/AT, PERRL, no JVD. Mucous membranes appear dry Lungs: Clear bilateral breath sounds Cardiovascular: RRR, no MRG Abdomen: Soft, non-tender, non-distended Extremities: No acute deformity or ROM limitation Neuro: alert, oriented, non-focal GU: Good urine production. Has made 500 CC urine since presenting to ED  Resolved Hospital Problem list     Assessment & Plan:   Shock: likely septic, however cardiogenic component cannot be ruled out. Suspect he also remains somewhat hypovolemic based on his dry MM and thirst.  No clear infectious source. Lactic acid has cleared to 1.4 after initial 2.5.  - Admit to ICU - Continue norepinephrine for MAP goal 65mmHg (this has been quickly weaning down since admission) - Continue empiric antibiotics as above - Can likely tolerate more volume, but will need to be gentle due to to  his CHF. 3L so far.  - Strict I&O - Follow cultures and RVP - Follow WBC and fever curve  Acute kidney injury with history of CKD. Baseline creatinine about 3.5 per patient. Now 5.5 on admission. BUN 112. Worsening likely due to shock state/dehydration. - Follow BMP  Chronic HFrEF (25-30%): seems volume down. Most recent echo from June - Strict I&O - Take care with further IVF.  -  Holding home coreg, digoxin, lasix, imdur, hydralazine - Check digoxin level  OSA not on CPAP - Avoid sedating medications  Best practice:  Diet: NPO - sips/chips OK Pain/Anxiety/Delirium protocol (if indicated): Na VAP protocol (if indicated): Na DVT prophylaxis: heparin GI prophylaxis: PPI Glucose control: na Mobility: BR Code Status: Full Family Communication: Patient and wife updated.  Disposition: ICU  Labs   CBC: Recent Labs  Lab 11/11/18 1741  WBC 20.7*  NEUTROABS 18.7*  HGB 11.4*  HCT 34.0*  MCV 84.2  PLT 161    Basic Metabolic Panel: Recent Labs  Lab 11/11/18 1741  NA 142  K 3.6  CL 105  CO2 21*  GLUCOSE 127*  BUN 112*  CREATININE 5.55*  CALCIUM 9.2   GFR: Estimated Creatinine Clearance: 11.7 mL/min (A) (by C-G formula based on SCr of 5.55 mg/dL (H)). Recent Labs  Lab 11/11/18 1741 11/11/18 2039  WBC 20.7*  --   LATICACIDVEN 2.5* 1.4    Liver Function Tests: Recent Labs  Lab 11/11/18 1741  AST 31  ALT 37  ALKPHOS 155*  BILITOT 2.4*  PROT 7.4  ALBUMIN 2.9*   No results for input(s): LIPASE, AMYLASE in the last 168 hours. No results for input(s): AMMONIA in the last 168 hours.  ABG    Component Value Date/Time   PHART 7.346 (L) 04/16/2015 1256   PCO2ART 41.7 04/16/2015 1256   PO2ART 42.0 (L) 04/16/2015 1256   HCO3 22.6 04/16/2015 1305   TCO2 24 04/16/2015 1305   ACIDBASEDEF 3.0 (H) 04/16/2015 1305   O2SAT 97.0 04/16/2015 1305     Coagulation Profile: Recent Labs  Lab 11/11/18 1741  INR 1.34    Cardiac Enzymes: No results for input(s): CKTOTAL, CKMB, CKMBINDEX, TROPONINI in the last 168 hours.  HbA1C: No results found for: HGBA1C  CBG: Recent Labs  Lab 11/12/18 0044  GLUCAP 170*    Review of Systems:   Bolds are positive  Constitutional: weight loss, gain, night sweats, Fevers, chills, fatigue .  HEENT: headaches, Sore throat, sneezing, nasal congestion, post nasal drip, Difficulty swallowing, Tooth/dental  problems, visual complaints visual changes, ear ache CV:  chest pain, radiates:,Orthopnea, PND, swelling in lower extremities, dizziness, palpitations, syncope.  GI  heartburn, indigestion, abdominal pain, nausea, vomiting, diarrhea, change in bowel habits, loss of appetite, bloody stools.  Resp: cough, productive: of clear sputum, hemoptysis, dyspnea, chest pain, pleuritic.  Skin: rash or itching or icterus GU: dysuria, change in color of urine, urgency or frequency. flank pain, hematuria  MS: joint pain or swelling. decreased range of motion  Psych: change in mood or affect. depression or anxiety.  Neuro: difficulty with speech, weakness, numbness, ataxia    Past Medical History  He,  has a past medical history of Adenomatous colon polyp (2011), AICD (automatic cardioverter/defibrillator) present, Anal fistula (2003), Arthritis, CHF (congestive heart failure) ([place]), CRI (chronic renal insufficiency), Diverticulosis, ED (erectile dysfunction), Gout, Headache, Hypertension, Internal hemorrhoids, Liver lesion (2007), Nonischemic cardiomyopathy ([place]), Renal cyst (2007), and Sleep apnea.   Surgical History    Past Surgical History:  Procedure Laterality  Date  . ANAL FISTULOTOMY  01/2002  . CARDIAC CATHETERIZATION N/A 04/16/2015   Procedure: Right/Left Heart Cath and Coronary Angiography;  Surgeon: Belva Crome, MD;  Location: [place] [facility];  Service: Cardiovascular;  Laterality: N/A;  . EP IMPLANTABLE DEVICE N/A 11/24/2015   Procedure: ICD Implant;  Surgeon: Evans Lance, MD;  Location: [place] [facility];  Service: Cardiovascular;  Laterality: N/A;  . ESOPHAGOGASTRODUODENOSCOPY (EGD) WITH PROPOFOL N/A 10/02/2016   Procedure: ESOPHAGOGASTRODUODENOSCOPY (EGD) WITH PROPOFOL;  Surgeon: Ladene Artist, MD;  Location[facility] ENDOSCOPY;  Service: Endoscopy;  Laterality: N/A;  . EUS N/A 10/19/2016   Procedure: UPPER ENDOSCOPIC ULTRASOUND (EUS) RADIAL;  Surgeon: Milus Banister, MD;  Location: [facility]  ENDOSCOPY;  Service: Endoscopy;  Laterality: N/A;  . INSERTION OF ICD  11/24/2015  . TONSILLECTOMY  ~ 82     Social History   reports that he has never smoked. He has never used smokeless tobacco. He reports that he does not drink alcohol or use drugs.   Family History   His family history includes Breast cancer in his sister; Clotting disorder in an other family member; Colon polyps in his brother; Diabetes in his father, sister, and sister; Heart failure (age of onset: 43) in his mother; Sickle cell anemia in his brother.   Allergies Allergies  Allergen Reactions  . Benicar [Olmesartan] Cough     Home Medications  Prior to Admission medications   Medication Sig Start Date End Date Taking? Authorizing Provider  allopurinol (ZYLOPRIM) 100 MG tablet TAKE 2 TABLETS (200 MG TOTAL) BY MOUTH DAILY. Patient taking differently: Take 150 mg by mouth daily.  02/27/18   Larey Dresser, MD  carvedilol (COREG) 12.5 MG tablet Take 1 tablet (12.5 mg total) by mouth 2 (two) times daily with a meal. 07/05/18   Larey Dresser, MD  Cholecalciferol (VITAMIN D3) 5000 units TABS Take by mouth.    [provider]  digoxin (LANOXIN) 0.125 MG tablet TAKE 0.5 TABLETS (0.0625 MG TOTAL) BY MOUTH EVERY OTHER DAY. 04/17/18   Larey Dresser, MD  ferric citrate (AURYXIA) 1 GM 210 MG(Fe) tablet Take 210 mg by mouth daily.    [provider]  furosemide (LASIX) 40 MG tablet Take 2 tabs (80 mg total) by mouth every morning and 1 tab (40 mg total) every evening alternating with 2 tabs (80 mg total) twice a day. 01/02/18   Larey Dresser, MD  hydrALAZINE (APRESOLINE) 25 MG tablet Take 1 tablet (25 mg total) by mouth 3 (three) times daily. 07/05/18   Larey Dresser, MD  isosorbide mononitrate (IMDUR) 30 MG 24 hr tablet TAKE 1 TABLET (30 MG TOTAL) BY MOUTH DAILY. 07/06/17   Bensimhon, Shaune Pascal, MD  isosorbide mononitrate (IMDUR) 30 MG 24 hr tablet TAKE 1 TABLET BY MOUTH EVERY DAY 07/09/18   Bensimhon,  Shaune Pascal, MD     Critical care time: 76 mins    Georgann Housekeeper, AGACNP-BC [place] Pager [phone] or [phone]  11/12/2018 1:38 AM

## 2018-11-13 DIAGNOSIS — I5022 Chronic systolic (congestive) heart failure: Secondary | ICD-10-CM

## 2018-11-13 DIAGNOSIS — N179 Acute kidney failure, unspecified: Secondary | ICD-10-CM

## 2018-11-13 LAB — URINE CULTURE: Culture: NO GROWTH

## 2018-11-13 LAB — COMPREHENSIVE METABOLIC PANEL
ALT: 31 U/L (ref 0–44)
AST: 19 U/L (ref 15–41)
Albumin: 2.1 g/dL — ABNORMAL LOW (ref 3.5–5.0)
Alkaline Phosphatase: 117 U/L (ref 38–126)
Anion gap: 12 (ref 5–15)
BUN: 87 mg/dL — ABNORMAL HIGH (ref 8–23)
CO2: 19 mmol/L — ABNORMAL LOW (ref 22–32)
Calcium: 8.7 mg/dL — ABNORMAL LOW (ref 8.9–10.3)
Chloride: 117 mmol/L — ABNORMAL HIGH (ref 98–111)
Creatinine, Ser: 3.71 mg/dL — ABNORMAL HIGH (ref 0.61–1.24)
GFR, EST AFRICAN AMERICAN: 17 mL/min — AB (ref >60)
GFR, EST NON AFRICAN AMERICAN: 15 mL/min — AB (ref >60)
Glucose, Bld: 104 mg/dL — ABNORMAL HIGH (ref 70–99)
Potassium: 3.5 mmol/L (ref 3.5–5.1)
Sodium: 148 mmol/L — ABNORMAL HIGH (ref 135–145)
TOTAL PROTEIN: 5.8 g/dL — AB (ref 6.5–8.1)
Total Bilirubin: 1 mg/dL (ref 0.3–1.2)

## 2018-11-13 LAB — CBC
HCT: 28.3 % — ABNORMAL LOW (ref 39.0–52.0)
Hemoglobin: 9.8 g/dL — ABNORMAL LOW (ref 13.0–17.0)
MCH: 28.7 pg (ref 26.0–34.0)
MCHC: 34.6 g/dL (ref 30.0–36.0)
MCV: 82.7 fL (ref 80.0–100.0)
PLATELETS: 244 10*3/uL (ref 150–400)
RBC: 3.42 MIL/uL — ABNORMAL LOW (ref 4.22–5.81)
RDW: 14.1 % (ref 11.5–15.5)
WBC: 17.4 10*3/uL — ABNORMAL HIGH (ref 4.0–10.5)
nRBC: 0 % (ref 0.0–0.2)

## 2018-11-13 MED ORDER — AZITHROMYCIN 250 MG PO TABS
250.0000 mg | ORAL_TABLET | Freq: Every day | ORAL | Status: DC
  Administered 2018-11-14: 250 mg via ORAL
  Filled 2018-11-13: qty 1

## 2018-11-13 MED ORDER — CARVEDILOL 3.125 MG PO TABS
3.1250 mg | ORAL_TABLET | Freq: Two times a day (BID) | ORAL | Status: DC
  Administered 2018-11-13 – 2018-11-14 (×2): 3.125 mg via ORAL
  Filled 2018-11-13 (×2): qty 1

## 2018-11-13 NOTE — Progress Notes (Signed)
NAME:  Miguel Roach, MRN:  [id number], DOB:  1941/04/05, LOS: 2 ADMISSION DATE:  11/11/2018, CONSULTATION DATE:  1/28 REFERRING MD:  Dr. Ronnald Nian EDP [facility], CHIEF COMPLAINT:  Shock   Brief History   78 year old male admitted with septic shock on pressors.   History of present illness   78 year old male with PMH as below, which is significant for CKD, HFrEF (25%), AICD in place, OSA, and hypertension. He presented to [facility] 1/27 with complaints of weakness and cough x 1 week. He [facility] a [facility] a few weeks ago and thinks he may have picked something up there. He jokingly tells me he has not been to [place]. Cough has been productive of only minimal clear sputum. He reports his appetite and PO intake have been poor as he has had some nausea after eating, but has not vomited. Hematuria also noted. He developed fever on 1/27 and for that reason he presented to [facility].  Upon arrival to the emergency department he was noted to by hypotensive, and with his complaints he was treated as a code sepsis and given IVF bolus. Workup did not demonstrate and clear infectious source. He was started on empiric antibiotics. Despite IVF bolus (3L) he remained hypotensive and was started on norepinephrine infusion. For this reason he was transferred to ICU at [facility] under PCCM.   Laboratory evaluation in the ED significant for WBC 20.7, Hgb 11.4, Creatinine 5.5, BUN 112, Lactic acid 2.53m which cleared to 1.4. UA negative.   Past Medical History   has a past medical history of Adenomatous colon polyp (2011), AICD (automatic cardioverter/defibrillator) present, Anal fistula (2003), Arthritis, CHF (congestive heart failure) ([place]), CRI (chronic renal insufficiency), Diverticulosis, ED (erectile dysfunction), Gout, Headache, Hypertension, Internal hemorrhoids, Liver lesion (2007), Nonischemic cardiomyopathy ([place]), Renal cyst (2007), and Sleep apnea.  Significant Hospital Events   1/27  admit  Consults:    Procedures:    Significant Diagnostic Tests:    Micro Data:  Blood 1/28 > Urine 1/28 > RVP 1/28 >  Antimicrobials:  Ceftriaxone 1/28 > Azithromycin 1/28 >  Interim history/subjective:  Patient was feeling better this morning.  Continue to require low-dose pressor. Per wife his blood pressure always runs low mostly in 35K systolic at home.  His heart failure team and Dr. Aundra Dubin wants him to have a lower blood pressure.  Objective   Blood pressure (!) 111/59, pulse 88, temperature 99.4 F (37.4 C), temperature source Oral, resp. rate 14, height 5\' 11"  (1.803 m), weight 75.7 kg, SpO2 96 %.        Intake/Output Summary (Last 24 hours) at 11/13/2018 0834 Last data filed at 11/13/2018 0544 Gross per 24 hour  Intake 1164.17 ml  Output 1810 ml  Net -645.83 ml   Filed Weights   11/12/18 0023 11/12/18 0500 11/13/18 0320  Weight: 74.5 kg 74.5 kg 75.7 kg    Examination: General: thin elderly male, in no acute distress. HENT: [place]/AT, PERRL, no JVD.  Lungs: Clear bilateral breath sounds, normal work of breathing. Cardiovascular: RRR, no MRG Abdomen: Soft, non-tender, non-distended Extremities: No lower extremity edema, pulses intact and symmetrical. Neuro: alert, oriented, non-focal GU: Good urine production.   Resolved Hospital Problem list     Assessment & Plan:   Shock: likely septic, however cardiogenic component cannot be ruled out.  No clear infectious source. Lactic acid has cleared to 1.4 after initial 2.5.  Continue to require low-dose Levophed at 3. Respiratory viral  panel negative.  blood cultures with MSSE in 1/4 bottles, most likely a contaminant. -D/C norepinephrine and watch for his BP, goal MAP 60. - Continue empiric antibiotics as above - Strict I&O - Follow WBC and fever curve  Acute kidney injury with history of CKD. Baseline creatinine about 3.5 per patient.  Improving now  3.71 - Follow BMP -Avoid nephrotoxic drugs.  Mild  hypernatremia.  Sodium at 148 with free water deficit of 2.2 L. -Encourage increase p.o. water intake.  Chronic HFrEF (25-30%): seems volume down. Most recent echo from June. Digoxin levels undetectable. - Strict I&O - Take care with further IVF.  - Holding home coreg, digoxin, lasix, imdur, hydralazine -Heart failure team is following-appreciate their recommendations. They are okay with systolic in 09X and will start low-dose Coreg back.  OSA not on CPAP - Avoid sedating medications  Best practice:  Diet: NPO -heart healthy Pain/Anxiety/Delirium protocol (if indicated): Na VAP protocol (if indicated): Na DVT prophylaxis: heparin GI prophylaxis: PPI Glucose control: na Mobility: BR Code Status: Full Family Communication: Patient and wife updated.  Disposition: Can be transferred to stepdown.  Labs   CBC: Recent Labs  Lab 11/11/18 1741 11/12/18 0311 11/12/18 0354 11/13/18 0537  WBC 20.7*  --  21.7* 17.4*  NEUTROABS 18.7*  --   --   --   HGB 11.4* 11.9* 9.2* 9.8*  HCT 34.0* 35.0* 26.4* 28.3*  MCV 84.2  --  82.8 82.7  PLT 269  --  230 833    Basic Metabolic Panel: Recent Labs  Lab 11/11/18 1741 11/12/18 0311 11/12/18 0354 11/13/18 0537  NA 142 133* 144 148*  K 3.6 3.5 3.5 3.5  CL 105  --  114* 117*  CO2 21*  --  19* 19*  GLUCOSE 127*  --  141* 104*  BUN 112*  --  98* 87*  CREATININE 5.55*  --  4.96* 3.71*  CALCIUM 9.2  --  8.4* 8.7*  MG  --   --  2.4  --   PHOS  --   --  4.3  --    GFR: Estimated Creatinine Clearance: 17.8 mL/min (A) (by C-G formula based on SCr of 3.71 mg/dL (H)). Recent Labs  Lab 11/11/18 1741 11/11/18 2039 11/12/18 0354 11/13/18 0537  WBC 20.7*  --  21.7* 17.4*  LATICACIDVEN 2.5* 1.4  --   --     Liver Function Tests: Recent Labs  Lab 11/11/18 1741 11/13/18 0537  AST 31 19  ALT 37 31  ALKPHOS 155* 117  BILITOT 2.4* 1.0  PROT 7.4 5.8*  ALBUMIN 2.9* 2.1*   No results for input(s): LIPASE, AMYLASE in the last 168  hours. No results for input(s): AMMONIA in the last 168 hours.  ABG    Component Value Date/Time   PHART 7.318 (L) 11/12/2018 0311   PCO2ART 66.2 (HH) 11/12/2018 0311   PO2ART 73.0 (L) 11/12/2018 0311   HCO3 34.0 (H) 11/12/2018 0311   TCO2 36 (H) 11/12/2018 0311   ACIDBASEDEF 3.0 (H) 04/16/2015 1305   O2SAT 92.0 11/12/2018 0311     Coagulation Profile: Recent Labs  Lab 11/11/18 1741  INR 1.34    Cardiac Enzymes: No results for input(s): CKTOTAL, CKMB, CKMBINDEX, TROPONINI in the last 168 hours.  HbA1C: No results found for: HGBA1C  CBG: Recent Labs  Lab 11/12/18 0044  GLUCAP 170*    Review of Systems:   Bolds are positive  Constitutional: weight loss, gain, night sweats, Fevers, chills, fatigue .  HEENT:  headaches, Sore throat, sneezing, nasal congestion, post nasal drip, Difficulty swallowing, Tooth/dental problems, visual complaints visual changes, ear ache CV:  chest pain, radiates:,Orthopnea, PND, swelling in lower extremities, dizziness, palpitations, syncope.  GI  heartburn, indigestion, abdominal pain, nausea, vomiting, diarrhea, change in bowel habits, loss of appetite, bloody stools.  Resp: cough, productive: of clear sputum, hemoptysis, dyspnea, chest pain, pleuritic.  Skin: rash or itching or icterus GU: dysuria, change in color of urine, urgency or frequency. flank pain, hematuria  MS: joint pain or swelling. decreased range of motion  Psych: change in mood or affect. depression or anxiety.  Neuro: difficulty with speech, weakness, numbness, ataxia    Past Medical History  He,  has a past medical history of Adenomatous colon polyp (2011), AICD (automatic cardioverter/defibrillator) present, Anal fistula (2003), Arthritis, CHF (congestive heart failure) ([place]), CRI (chronic renal insufficiency), Diverticulosis, ED (erectile dysfunction), Gout, Headache, Hypertension, Internal hemorrhoids, Liver lesion (2007), Nonischemic cardiomyopathy ([place]), Renal cyst  (2007), and Sleep apnea.   Surgical History    Past Surgical History:  Procedure Laterality Date  . ANAL FISTULOTOMY  01/2002  . CARDIAC CATHETERIZATION N/A 04/16/2015   Procedure: Right/Left Heart Cath and Coronary Angiography;  Surgeon: Belva Crome, MD;  Location: [place] [facility];  Service: Cardiovascular;  Laterality: N/A;  . EP IMPLANTABLE DEVICE N/A 11/24/2015   Procedure: ICD Implant;  Surgeon: Evans Lance, MD;  Location: [place] [facility];  Service: Cardiovascular;  Laterality: N/A;  . ESOPHAGOGASTRODUODENOSCOPY (EGD) WITH PROPOFOL N/A 10/02/2016   Procedure: ESOPHAGOGASTRODUODENOSCOPY (EGD) WITH PROPOFOL;  Surgeon: Ladene Artist, MD;  Location: [facility] ENDOSCOPY;  Service: Endoscopy;  Laterality: N/A;  . EUS N/A 10/19/2016   Procedure: UPPER ENDOSCOPIC ULTRASOUND (EUS) RADIAL;  Surgeon: Milus Banister, MD;  Location: [facility] ENDOSCOPY;  Service: Endoscopy;  Laterality: N/A;  . INSERTION OF ICD  11/24/2015  . TONSILLECTOMY  ~ 82     Social History   reports that he has never smoked. He has never used smokeless tobacco. He reports that he does not drink alcohol or use drugs.   Family History   His family history includes Breast cancer in his sister; Clotting disorder in an other family member; Colon polyps in his brother; Diabetes in his father, sister, and sister; Heart failure (age of onset: 24) in his mother; Sickle cell anemia in his brother.   Allergies Allergies  Allergen Reactions  . Benicar [Olmesartan] Cough     Home Medications  Prior to Admission medications   Medication Sig Start Date End Date Taking? Authorizing Provider  allopurinol (ZYLOPRIM) 100 MG tablet TAKE 2 TABLETS (200 MG TOTAL) BY MOUTH DAILY. Patient taking differently: Take 150 mg by mouth daily.  02/27/18   Larey Dresser, MD  carvedilol (COREG) 12.5 MG tablet Take 1 tablet (12.5 mg total) by mouth 2 (two) times daily with a meal. 07/05/18   Larey Dresser, MD  Cholecalciferol (VITAMIN D3) 5000 units  TABS Take by mouth.    [provider]  digoxin (LANOXIN) 0.125 MG tablet TAKE 0.5 TABLETS (0.0625 MG TOTAL) BY MOUTH EVERY OTHER DAY. 04/17/18   Larey Dresser, MD  ferric citrate (AURYXIA) 1 GM 210 MG(Fe) tablet Take 210 mg by mouth daily.    [provider]  furosemide (LASIX) 40 MG tablet Take 2 tabs (80 mg total) by mouth every morning and 1 tab (40 mg total) every evening alternating with 2 tabs (80 mg total) twice a day. 01/02/18   [name]  S, MD  hydrALAZINE (APRESOLINE) 25 MG tablet Take 1 tablet (25 mg total) by mouth 3 (three) times daily. 07/05/18   Larey Dresser, MD  isosorbide mononitrate (IMDUR) 30 MG 24 hr tablet TAKE 1 TABLET (30 MG TOTAL) BY MOUTH DAILY. 07/06/17   Bensimhon, Shaune Pascal, MD  isosorbide mononitrate (IMDUR) 30 MG 24 hr tablet TAKE 1 TABLET BY MOUTH EVERY DAY 07/09/18   Bensimhon, Shaune Pascal, MD         Lorella Nimrod MD PGY3 Pager [phone] 11/13/2018 8:34 AM

## 2018-11-13 NOTE — Consult Note (Addendum)
Advanced Heart Failure Team Consult Note   Primary Physician: Deland Pretty, MD PCP-Cardiologist:  No primary care provider on file.  Reason for Consultation: Septic shock in systolic CHF patient  HPI:    Miguel Roach is seen today for evaluation of septic shock in systolic CHF patient at the request of Dr. Loanne Drilling.   Miguel Roach is a 78 y.o. male with chronic systolic CHF, NICM, h/o VT with appropriate shock, PAF (short 10 minute episode noted on ICD), OSA, Gout, and CKD IV.   Last seen in CHF office 10/14/18. Was doing well overall. Going to [facility] 4-5 days/week. Denies cp. Didn't tolerate up-titration of Coreg nor hydralazine. No changes made with his significant renal dysfunction.  Weight 177 lbs at that visit.   Pt presented to [facility] 11/11/2018 with weakness, fever, and cough. Exam notable for hypotension and worsening renal failure. Shock criteria met, thought most likely source to be pulmonary/CAP. Pertinent labs on admission include WBC 20.7, Hgb 11.4, Cr 5.5, BUN 112, Lactic acid 2.5 -> 1.4. UA  Negative. Pt started on broad spectrum ABX and IVF.   He is feeling much better today. Levophed stopped ~ 0800 this am. SBPs 90-100s, MAPs upper 60s-70s. He denies SOB. Does not feel swollen despite overall net + this admission. Weight 166 lbs this am (was > 170 last office visit, as above). Continues to have cough with scant production, but improving on ABX.   WBC remain elevated at 17.4. Hgb 9.8. Cr 5.55 -> 4.96 -> 3.71. BUN down to 87.   Echo 03/2018 LVEF 25-30%, Grade 1 DD, Mild AI, Mildly dilated AA.   BCx 1 of 4 are aerobic GPC and BCID showing Staph species (Methicillin sensitive) Resp panel negative. MRSA PCR negative. UCx pending.    Review of systems complete and found to be negative unless listed in HPI.    Home Medications Prior to Admission medications   Medication Sig Start Date End Date Taking? Authorizing Provider  allopurinol (ZYLOPRIM) 100 MG tablet TAKE 2  TABLETS (200 MG TOTAL) BY MOUTH DAILY. Patient taking differently: Take 150 mg by mouth every evening.  02/27/18  Yes Larey Dresser, MD  carvedilol (COREG) 12.5 MG tablet Take 1 tablet (12.5 mg total) by mouth 2 (two) times daily with a meal. Patient taking differently: Take 12.5 mg by mouth 2 (two) times daily with a meal. Take at 2:pm and 10:30pm 07/05/18  Yes Larey Dresser, MD  Cholecalciferol (VITAMIN D3) 5000 units TABS Take 5,000 Units by mouth every morning.    Yes [provider]  colchicine 0.6 MG tablet Take 0.6 mg by mouth daily as needed (gout flare up).   Yes [provider]  digoxin (LANOXIN) 0.125 MG tablet TAKE 0.5 TABLETS (0.0625 MG TOTAL) BY MOUTH EVERY OTHER DAY. 04/17/18  Yes Larey Dresser, MD  ferric citrate (AURYXIA) 1 GM 210 MG(Fe) tablet Take 210 mg by mouth daily.   Yes [provider]  furosemide (LASIX) 40 MG tablet Take 2 tabs (80 mg total) by mouth every morning and 1 tab (40 mg total) every evening alternating with 2 tabs (80 mg total) twice a day. Patient taking differently: Take 40-80 mg by mouth See admin instructions. Take 2 tabs (80 mg total) by mouth daily at 2PM, and 1 tab (40 mg total) every evening at 6:30pm ALTERNATE WITH 2 TABS BY MOUTH TWICE DAILY ONE DAY AND 2 TABLETS AT 2PM, 1 TABLET AT 6:30. 01/02/18  Yes Aundra Dubin,  Elby Showers, MD  hydrALAZINE (APRESOLINE) 25 MG tablet Take 1 tablet (25 mg total) by mouth 3 (three) times daily. Patient taking differently: Take 25 mg by mouth 3 (three) times daily. 2:pm, 6:30pm, and bedtime 07/05/18  Yes Larey Dresser, MD  isosorbide mononitrate (IMDUR) 30 MG 24 hr tablet TAKE 1 TABLET (30 MG TOTAL) BY MOUTH DAILY. Patient taking differently: Take 30 mg by mouth at bedtime.  07/06/17  Yes Bensimhon, Shaune Pascal, MD    Past Medical History: Past Medical History:  Diagnosis Date  . Adenomatous colon polyp 2011  . AICD (automatic cardioverter/defibrillator) present   . Anal fistula 2003  .  Arthritis   . CHF (congestive heart failure) ([place])   . CRI (chronic renal insufficiency)    creatinine back to normal  . Diverticulosis   . ED (erectile dysfunction)   . Gout   . Headache   . Hypertension   . Internal hemorrhoids   . Liver lesion 2007  . Nonischemic cardiomyopathy ([place])    EF 20-25% by echo 2016, At that time 40% RCA stenosis, distal 35% circumflex stenosis  . Renal cyst 2007   "I'm not familiar with that, but I follow up on my creatinine because my kidney function is low." 09/2016  . Sleep apnea    "had test; never followed thru w/getting mask" (11/25/2015)    Past Surgical History: Past Surgical History:  Procedure Laterality Date  . ANAL FISTULOTOMY  01/2002  . CARDIAC CATHETERIZATION N/A 04/16/2015   Procedure: Right/Left Heart Cath and Coronary Angiography;  Surgeon: Belva Crome, MD;  Location: [facility] [facility];  Service: Cardiovascular;  Laterality: N/A;  . EP IMPLANTABLE DEVICE N/A 11/24/2015   Procedure: ICD Implant;  Surgeon: Evans Lance, MD;  Location: [place] [facility];  Service: Cardiovascular;  Laterality: N/A;  . ESOPHAGOGASTRODUODENOSCOPY (EGD) WITH PROPOFOL N/A 10/02/2016   Procedure: ESOPHAGOGASTRODUODENOSCOPY (EGD) WITH PROPOFOL;  Surgeon: Ladene Artist, MD;  Location: [facility] ENDOSCOPY;  Service: Endoscopy;  Laterality: N/A;  . EUS N/A 10/19/2016   Procedure: UPPER ENDOSCOPIC ULTRASOUND (EUS) RADIAL;  Surgeon: Milus Banister, MD;  Location: [facility] ENDOSCOPY;  Service: Endoscopy;  Laterality: N/A;  . INSERTION OF ICD  11/24/2015  . TONSILLECTOMY  ~ 1950    Family History: Family History  Problem Relation Age of Onset  . Heart failure Mother 69  . Sickle cell anemia Brother   . Breast cancer Sister   . Diabetes Sister   . Diabetes Father        died at age 89  . Diabetes Sister   . Colon polyps Brother   . Clotting disorder Other        two daughters (inherited from his first wife)    Social History: Social History   Socioeconomic History    . Marital status: Married    Spouse name: [name]  . Number of children: 4  . Years of education: Not on file  . Highest education level: Not on file  Occupational History  . Occupation: Retired Environmental consultant  . Occupation: Retired Animal nutritionist  Social Needs  . Financial resource strain: Not on file  . Food insecurity:    Worry: Not on file    Inability: Not on file  . Transportation needs:    Medical: Not on file    Non-medical: Not on file  Tobacco Use  . Smoking status: Never Smoker  . Smokeless tobacco: Never Used  . Tobacco comment: "smoked briefly in my  freshman year in college; purchased 1  pack of cigarettes; didn't finish that"  Substance and Sexual Activity  . Alcohol use: No    Alcohol/week: 0.0 standard drinks    Comment: 11/25/2015 "used to drink a beer q now in then back in my 20s"  . Drug use: No  . Sexual activity: Not Currently  Lifestyle  . Physical activity:    Days per week: Not on file    Minutes per session: Not on file  . Stress: Not on file  Relationships  . Social connections:    Talks on phone: Not on file    Gets together: Not on file    Attends religious service: Not on file    Active member of club or organization: Not on file    Attends meetings of clubs or organizations: Not on file    Relationship status: Not on file  Other Topics Concern  . Not on file  Social History Narrative  . Not on file    Allergies:  Allergies  Allergen Reactions  . Benicar [Olmesartan] Cough   Objective:    Vital Signs:   Temp:  [97.5 F (36.4 C)-99.7 F (37.6 C)] 99.4 F (37.4 C) (01/29 0711) Pulse Rate:  [35-153] 82 (01/29 0855) Resp:  [12-36] 13 (01/29 0855) BP: (86-130)/(50-102) 102/62 (01/29 0855) SpO2:  [92 %-100 %] 98 % (01/29 0855) Weight:  [75.7 kg] 75.7 kg (01/29 0320) Last BM Date: 11/11/18  Weight change: Filed Weights   11/12/18 0023 11/12/18 0500 11/13/18 0320  Weight: 74.5 kg 74.5 kg 75.7 kg     Intake/Output:   Intake/Output Summary (Last 24 hours) at 11/13/2018 1010 Last data filed at 11/13/2018 0900 Gross per 24 hour  Intake 785.04 ml  Output 1605 ml  Net -819.96 ml    Physical Exam    General. NAD.  HEENT: Normal Neck: supple. JVP ~6-7 cm. Carotids 2+ bilat; no bruits. No lymphadenopathy or thyromegaly appreciated. Cor: PMI nondisplaced. Regular rate & rhythm. No rubs, gallops or murmurs. Lungs: Slightly diminished basilar sounds.  Abdomen: soft, nontender, nondistended. No hepatosplenomegaly. No bruits or masses. Good bowel sounds. Extremities: no cyanosis, clubbing, or rash. Trace ankle edema.  Neuro: alert & orientedx3, cranial nerves grossly intact. moves all 4 extremities w/o difficulty. Affect pleasant  Telemetry   NSR 70-80s with PVCs, personally reviewed.   EKG    11/12/2018 NSR 97 bpm, personally reviewed.   Labs   Basic Metabolic Panel: Recent Labs  Lab 11/11/18 1741 11/12/18 0311 11/12/18 0354 11/13/18 0537  NA 142 133* 144 148*  K 3.6 3.5 3.5 3.5  CL 105  --  114* 117*  CO2 21*  --  19* 19*  GLUCOSE 127*  --  141* 104*  BUN 112*  --  98* 87*  CREATININE 5.55*  --  4.96* 3.71*  CALCIUM 9.2  --  8.4* 8.7*  MG  --   --  2.4  --   PHOS  --   --  4.3  --     Liver Function Tests: Recent Labs  Lab 11/11/18 1741 11/13/18 0537  AST 31 19  ALT 37 31  ALKPHOS 155* 117  BILITOT 2.4* 1.0  PROT 7.4 5.8*  ALBUMIN 2.9* 2.1*   No results for input(s): LIPASE, AMYLASE in the last 168 hours. No results for input(s): AMMONIA in the last 168 hours.  CBC: Recent Labs  Lab 11/11/18 1741 11/12/18 0311 11/12/18 0354 11/13/18 0537  WBC 20.7*  --  21.7*  17.4*  NEUTROABS 18.7*  --   --   --   HGB 11.4* 11.9* 9.2* 9.8*  HCT 34.0* 35.0* 26.4* 28.3*  MCV 84.2  --  82.8 82.7  PLT 269  --  230 244    Cardiac Enzymes: No results for input(s): CKTOTAL, CKMB, CKMBINDEX, TROPONINI in the last 168 hours.  BNP: BNP (last 3 results) No results  for input(s): BNP in the last 8760 hours.  ProBNP (last 3 results) No results for input(s): PROBNP in the last 8760 hours.   CBG: Recent Labs  Lab 11/12/18 0044  GLUCAP 170*    Coagulation Studies: Recent Labs    11/11/18 1741  LABPROT 16.5*  INR 1.34     Imaging    No results found.  Medications:     Current Medications: . heparin  5,000 Units Subcutaneous Q8H  . sodium chloride flush  3 mL Intravenous Once     Infusions: . sodium chloride 10 mL/hr at 11/13/18 0900  . azithromycin Stopped (11/12/18 2013)  . cefTRIAXone (ROCEPHIN)  IV Stopped (11/12/18 2121)  . norepinephrine (LEVOPHED) Adult infusion Stopped (11/13/18 0845)      Patient Profile   Miguel Roach is a 78 y.o. male with chronic systolic CHF, NICM, h/o VT with appropriate shock, PAF (short 10 minute episode noted on ICD), OSA, Gout, and CKD IV.   Presented to [facility] 11/12/2018 with fever and hypotension in setting of respiratory infection. Admitted for sepsis. CHF team consulted with EF <30% and ? Cardiogenic component.   Assessment/Plan   1. Shock, Septic +/- cardiogenic component - BCx 1 of 4 are aerobic GPC and BCID showing Staph species (Methicillin sensitive) Resp panel negative. MRSA PCR negative. UCx pending.   - Lactic acid to 1.4 11/11/2018. - Covering with Rocephin + Azithromycin. WBC remain elevated at 17.  - Continue to follow closely.  - Off of NE as of this am.  2. Acute on chronic systolic CHF, NICM CO preserved on RHC in 6/16, but 10/16 CPX showed moderate to severe functional impairment (seems worse than his symptoms would suggest).  However, repeat CPX in 11/17, though submaximal, suggested no more than mild HF limitation. No significant CAD with coronary angiography in 6/16.  No hx heavy ETOH or drug abuse. No marked HTN.  Negative SPEP/UPEP.  Possible prior myocarditis.  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  He now has Medtronic ICD.  Echo  in 6/19 showed EF 25-30% (stable) with mild-moderate AI and mildly decreased RV systolic function. - Volume status looks OK on exam despite net positive. Remains on NS 10 cc/hr. Consider stopping soon.  - NE stopped this am. No room to add back any cardiac meds.  - Holding coreg and lasix with hypotension/sepsis.  - Holding hydral/imdur with same.  - Holding digoxin with ARF - No ACE/ARB/ARNI or spiro with CKD IV.  - Consider repeat echo.  3. ARF on CKD IV - Cr up to 5.5 on admission. Now down to 3.7 with IVF. - Follow closely.  4. OSA:  - CCM following.  5. H/o VT: Appropriate ICD discharge in 3/18, no recurrence.  - BB on hold with sepsis.  - Hold off on anti-arrhythmic unless VT recurs.  - Follow electrolytes closely with ARF.  6. Atrial fibrillation: Paroxysmal.  Very short episodes previously noted by device interrogation. He has had no prolonged or symptomatic atrial fibrillation (data for anticoagulating short runs of atrial fibrillation is not strong), so will  hold off on anticoagulation.  - If he develops prolonged or symptomatic atrial fibrillation, would anticoagulate.  - Follow closely with sepsis.  He appears to have made significant improvement in the past few days. Now off levophed. If BP remains stable, suspect can move out of ICU soon. Would consider cardiac progressive care with his co-morbidities.   Medication concerns reviewed with patient and pharmacy team. Barriers identified: None at this time.   Length of Stay: 2  Shirley Friar, PA-C  11/13/2018, 10:10 AM  Advanced Heart Failure Team Pager [phone] (M-F; 7a - 4p)  Please contact [facility] for night-coverage after hours (4p -7a ) and weekends on [email or web address]  Patient seen with PA, agree with the above note.   Patient was admitted with fever to 101, WBCs 20, hypotensive.  He was given some IV fluid and started on norepinephrine and abx.  Norepinephrine stopped today, SBP in 120s currently.  He is  now afebrile.  Initially with AKI, creatinine 5.52.  Now creatinine down to 3.71.   On exam, no JVD.  Regular S1S2.  Lungs with very mild crackles at bases.  No edema.   1. Shock: Patient was admitted with shock, fever, leukocytosis.  Of note, baseline BP runs on the low side with nonischemic cardiomyopathy.  Influenza negative, procalcitonin was not drawn.  I suspect that this was primarily septic shock. He is now off norepinephrine with stable BP.  2. Chronic systolic CHF: Nonischemic cardiomyopathy.  Last echo in 6/19 with EF 25-30%. Medtronic ICD.  He is currently off Coreg, hydralazine/Imdur, and Lasix with hypotension.  Off digoxin with AKI.  As above, suspect shock was primarily septic in setting of baseline poor cardiac function rather than primarily cardiogenic. He does not look significantly volume overloaded on exam.  - Frequent PVCs, restart low dose Coreg 3.125 mg bid.  - Restart other cardiac meds as BP allows.  - If creatinine continues to trend down, could start back on home Lasix regimen tomorrow.  - Would keep off digoxin for now, he is a very marginal candidate for digoxin with baseline elevated creatinine.  3. AKI on CKD stage IV: Suspect AKI is related to hypotension.  Now resolving, creatinine down to 3.7 with baseline 3.1-3.5 range recently.  4. ID: Influenza negative as above. Cultures with coag negative Staph, likely contaminant.  As above, suspect inciting event was infectious, viral versus bacterial.  Probably respiratory.  - Continue broad spectrum abx per primary team.  5. H/o VT: Having PVCs.  Restart Coreg at low dose as above.   [name] 11/13/2018  1:24 PM

## 2018-11-14 DIAGNOSIS — I5023 Acute on chronic systolic (congestive) heart failure: Secondary | ICD-10-CM

## 2018-11-14 DIAGNOSIS — N184 Chronic kidney disease, stage 4 (severe): Secondary | ICD-10-CM

## 2018-11-14 DIAGNOSIS — M109 Gout, unspecified: Secondary | ICD-10-CM

## 2018-11-14 LAB — RENAL FUNCTION PANEL
Albumin: 1.9 g/dL — ABNORMAL LOW (ref 3.5–5.0)
Anion gap: 10 (ref 5–15)
BUN: 78 mg/dL — ABNORMAL HIGH (ref 8–23)
CO2: 22 mmol/L (ref 22–32)
Calcium: 8.4 mg/dL — ABNORMAL LOW (ref 8.9–10.3)
Chloride: 118 mmol/L — ABNORMAL HIGH (ref 98–111)
Creatinine, Ser: 3.28 mg/dL — ABNORMAL HIGH (ref 0.61–1.24)
GFR calc Af Amer: 20 mL/min — ABNORMAL LOW (ref >60)
GFR calc non Af Amer: 17 mL/min — ABNORMAL LOW (ref >60)
Glucose, Bld: 113 mg/dL — ABNORMAL HIGH (ref 70–99)
PHOSPHORUS: 4.1 mg/dL (ref 2.5–4.6)
POTASSIUM: 3.6 mmol/L (ref 3.5–5.1)
Sodium: 150 mmol/L — ABNORMAL HIGH (ref 135–145)

## 2018-11-14 LAB — CBC
HEMATOCRIT: 27.5 % — AB (ref 39.0–52.0)
Hemoglobin: 9 g/dL — ABNORMAL LOW (ref 13.0–17.0)
MCH: 27.5 pg (ref 26.0–34.0)
MCHC: 32.7 g/dL (ref 30.0–36.0)
MCV: 84.1 fL (ref 80.0–100.0)
PLATELETS: 183 10*3/uL (ref 150–400)
RBC: 3.27 MIL/uL — ABNORMAL LOW (ref 4.22–5.81)
RDW: 14.2 % (ref 11.5–15.5)
WBC: 14.9 10*3/uL — ABNORMAL HIGH (ref 4.0–10.5)
nRBC: 0 % (ref 0.0–0.2)

## 2018-11-14 LAB — MAGNESIUM: Magnesium: 2.7 mg/dL — ABNORMAL HIGH (ref 1.7–2.4)

## 2018-11-14 LAB — CULTURE, BLOOD (ROUTINE X 2): Special Requests: ADEQUATE

## 2018-11-14 MED ORDER — ALLOPURINOL 300 MG PO TABS
150.0000 mg | ORAL_TABLET | Freq: Every day | ORAL | Status: DC
  Administered 2018-11-14 – 2018-11-17 (×4): 150 mg via ORAL
  Filled 2018-11-14 (×4): qty 1

## 2018-11-14 MED ORDER — SENNA 8.6 MG PO TABS: 1.0000 | ORAL_TABLET | Freq: Every day | ORAL | Status: DC | PRN

## 2018-11-14 MED ORDER — FUROSEMIDE 40 MG PO TABS
40.0000 mg | ORAL_TABLET | Freq: Every evening | ORAL | Status: DC
  Administered 2018-11-14 – 2018-11-16 (×3): 40 mg via ORAL
  Filled 2018-11-14 (×3): qty 1

## 2018-11-14 MED ORDER — POTASSIUM CHLORIDE CRYS ER 20 MEQ PO TBCR
20.0000 meq | EXTENDED_RELEASE_TABLET | Freq: Once | ORAL | Status: AC
  Administered 2018-11-14: 20 meq via ORAL
  Filled 2018-11-14: qty 1

## 2018-11-14 MED ORDER — FUROSEMIDE 80 MG PO TABS
80.0000 mg | ORAL_TABLET | Freq: Every morning | ORAL | Status: DC
  Administered 2018-11-14 – 2018-11-17 (×4): 80 mg via ORAL
  Filled 2018-11-14 (×4): qty 1

## 2018-11-14 MED ORDER — DOCUSATE SODIUM 100 MG PO CAPS: 100.0000 mg | ORAL_CAPSULE | Freq: Every day | ORAL | Status: DC | PRN

## 2018-11-14 MED ORDER — CARVEDILOL 6.25 MG PO TABS
6.2500 mg | ORAL_TABLET | Freq: Two times a day (BID) | ORAL | Status: DC
  Administered 2018-11-14 – 2018-11-17 (×6): 6.25 mg via ORAL
  Filled 2018-11-14 (×6): qty 1

## 2018-11-14 NOTE — Progress Notes (Signed)
PROGRESS NOTE    BASSEL GASKILL  HAL:[id number] DOB: Oct 02, 1941 DOA: 11/11/2018 PCP: Deland Pretty, MD    Brief Narrative:  78 year old male who presented with hypotension.  He does have significant past medical history for chronic kidney disease, chronic systolic heart failure status post AICD, hypertension and sleep apnea.  Patient reports 7 days of feeling weak, associated with productive cough and fever.  He was noted to be hypotensive at [facility], which was refractory to IV fluids and required IV vasopressors.  Transferred to [facility] critical care unit.  His vital signs at time of transfer blood pressure 121/70, heart rate 85, temperature 98.3, respiratory rate 21, oxygen saturation 99% on IV norepinephrine.  pH 7.31, PCO2 66, PO2 73, bicarb 34.0, oxygen saturation 90%, sodium 144, potassium 3.5, chloride 114, bicarb 19, glucose 141, BUN 98, creatinine 4.96, white count 21.7, hemoglobin 9.6, hematocrit 26.4, platelets 230, his urinalysis was negative for infection.  His a chest x-ray was negative for infiltrates, pacer/AICD in place.  EKG sinus rhythm, left axis, poor R wave progression, intraventricular conduction delay.  Patient was admitted to the intensive care unit working diagnosis of hypotension to rule out septic shock.    Assessment & Plan:   Active Problems:   Sepsis ([place])   Septic shock ([place])   1. Hypotension, to rule out sepsis. Chest film with no pneumonic infiltrates, patient has remained afebrile, cultures have been no significant growth, likely contaminant coagulase negative staph. Wbc down to 14.9. Will hold on further antibiotic therapy for now and will continue to monitor cultures, cell count and temperature curve.   2. Acute on chronic systolic heart failure decompensation. Now patient off vasopressors, will continue heart failure management with low dose carvedilol continue holding vasodilators and diuretics.   3. AKI on CKD stage IV, complicated with hypernatremia  and hyperchloremia.  Base cr at 3,0 with calculated GFR of 22. Serum cr today at 3,28, with K at 3,6, NA 127m Cl 118 and serum bicarbonate at 22. Will hold on further IV fluids and will follow on renal panel in am, avoid hypotension and nephrotoxic medications. Started on furosemide po.   4. Gout. Resume allopurinol. No signs of acute exacerbation.   5. Reactive leukocytosis. No signs of deep infection, will hold on antibiotic therapy, check cell count in am.   DVT prophylaxis: heparin   Code Status:  full Family Communication: no family at the bedside  Disposition Plan/ discharge barriers: pending clinical improvement.   Body mass index is 22.75 kg/m. Malnutrition Type:      Malnutrition Characteristics:      Nutrition Interventions:     RN Pressure Injury Documentation:     Consultants:   Cardiology   Procedures:     Antimicrobials:       Subjective: Patient is feeling better, not yet back to baseline, no chest pain, no nausea or vomiting.   Objective: Vitals:   11/14/18 0840 11/14/18 0903 11/14/18 1107 11/14/18 1136  BP: 114/71  (!) 101/56 106/85  Pulse:  (!) 109  83  Resp:    17  Temp:    (!) 97.5 F (36.4 C)  TempSrc:    Oral  SpO2:    99%  Weight:      Height:        Intake/Output Summary (Last 24 hours) at 11/14/2018 1142 Last data filed at 11/14/2018 1100 Gross per 24 hour  Intake 499.99 ml  Output 1241 ml  Net -741.01 ml   [employer]  11/13/18 0320 11/13/18 1804 11/14/18 0436  Weight: 75.7 kg 74 kg 74 kg    Examination:   General: deconditioned  Neurology: Awake and alert, non focal  E ENT: mild pallor, no icterus, oral mucosa moist Cardiovascular: No JVD. S1-S2 present, rhythmic, no gallops, rubs, or murmurs. No lower extremity edema. Pulmonary: positive breath sounds bilaterally, adequate air movement, no wheezing, rhonchi or rales. Gastrointestinal. Abdomen with no organomegaly, non tender, no rebound or guarding Skin. No  rashes Musculoskeletal: no joint deformities     Data Reviewed: I have personally reviewed following labs and imaging studies  CBC: Recent Labs  Lab 11/11/18 1741 11/12/18 0311 11/12/18 0354 11/13/18 0537 11/14/18 0410  WBC 20.7*  --  21.7* 17.4* 14.9*  NEUTROABS 18.7*  --   --   --   --   HGB 11.4* 11.9* 9.2* 9.8* 9.0*  HCT 34.0* 35.0* 26.4* 28.3* 27.5*  MCV 84.2  --  82.8 82.7 84.1  PLT 269  --  230 244 093   Basic Metabolic Panel: Recent Labs  Lab 11/11/18 1741 11/12/18 0311 11/12/18 0354 11/13/18 0537 11/14/18 0410  NA 142 133* 144 148* 150*  K 3.6 3.5 3.5 3.5 3.6  CL 105  --  114* 117* 118*  CO2 21*  --  19* 19* 22  GLUCOSE 127*  --  141* 104* 113*  BUN 112*  --  98* 87* 78*  CREATININE 5.55*  --  4.96* 3.71* 3.28*  CALCIUM 9.2  --  8.4* 8.7* 8.4*  MG  --   --  2.4  --   --   PHOS  --   --  4.3  --  4.1   GFR: Estimated Creatinine Clearance: 19.7 mL/min (A) (by C-G formula based on SCr of 3.28 mg/dL (H)). Liver Function Tests: Recent Labs  Lab 11/11/18 1741 11/13/18 0537 11/14/18 0410  AST 31 19  --   ALT 37 31  --   ALKPHOS 155* 117  --   BILITOT 2.4* 1.0  --   PROT 7.4 5.8*  --   ALBUMIN 2.9* 2.1* 1.9*   No results for input(s): LIPASE, AMYLASE in the last 168 hours. No results for input(s): AMMONIA in the last 168 hours. Coagulation Profile: Recent Labs  Lab 11/11/18 1741  INR 1.34   Cardiac Enzymes: No results for input(s): CKTOTAL, CKMB, CKMBINDEX, TROPONINI in the last 168 hours. BNP (last 3 results) No results for input(s): PROBNP in the last 8760 hours. HbA1C: No results for input(s): HGBA1C in the last 72 hours. CBG: Recent Labs  Lab 11/12/18 0044  GLUCAP 170*   Lipid Profile: No results for input(s): CHOL, HDL, LDLCALC, TRIG, CHOLHDL, LDLDIRECT in the last 72 hours. Thyroid Function Tests: No results for input(s): TSH, T4TOTAL, FREET4, T3FREE, THYROIDAB in the last 72 hours. Anemia Panel: No results for input(s):  VITAMINB12, FOLATE, FERRITIN, TIBC, IRON, RETICCTPCT in the last 72 hours.    Radiology Studies: I have reviewed all of the imaging during this hospital visit personally     Scheduled Meds: . azithromycin  250 mg Oral Daily  . carvedilol  3.125 mg Oral BID WC  . furosemide  40 mg Oral QPM  . furosemide  80 mg Oral q morning - 10a  . heparin  5,000 Units Subcutaneous Q8H  . sodium chloride flush  3 mL Intravenous Once   Continuous Infusions: . sodium chloride 10 mL/hr at 11/13/18 1300  . cefTRIAXone (ROCEPHIN)  IV 2 g (11/13/18 1858)  LOS: 3 days        Carmelo Reidel Gerome Apley, MD [facility] Pager [phone]

## 2018-11-14 NOTE — Progress Notes (Addendum)
Advanced Heart Failure Rounding Note  PCP-Cardiologist: No primary care provider on file.   Subjective:    Afebrile. WBC down to 14.9 on Rocephin/azithromycin  Feeling good this am. Appetite improving and waiting for breakfast.   Cr down to 3.28 which is near his baseline. K 3.6. Hgb 9.0  Objective:   Weight Range: 74 kg Body mass index is 22.75 kg/m.   Vital Signs:   Temp:  [97.6 F (36.4 C)-98.2 F (36.8 C)] 97.7 F (36.5 C) (01/30 0810) Pulse Rate:  [46-139] 71 (01/30 0810) Resp:  [13-30] 22 (01/30 0020) BP: (90-124)/(53-87) 111/87 (01/30 0810) SpO2:  [61 %-100 %] 99 % (01/30 0810) Weight:  [74 kg] 74 kg (01/30 0436) Last BM Date: 11/11/18  Weight change: Filed Weights   11/13/18 0320 11/13/18 1804 11/14/18 0436  Weight: 75.7 kg 74 kg 74 kg   Intake/Output:   Intake/Output Summary (Last 24 hours) at 11/14/2018 0850 Last data filed at 11/14/2018 0436 Gross per 24 hour  Intake 54.73 ml  Output 665 ml  Net -610.27 ml    Physical Exam    General:  Well appearing. No resp difficulty HEENT: Normal Neck: Supple. JVP ~6-7 cm. Carotids 2+ bilat; no bruits. No lymphadenopathy or thyromegaly appreciated. Cor: PMI nondisplaced. Regular rate & rhythm. No rubs, gallops or murmurs. Lungs: Slightly diminished basilar sounds.  Abdomen: Soft, nontender, nondistended. No hepatosplenomegaly. No bruits or masses. Good bowel sounds. Extremities: No cyanosis, clubbing or rash. Trace ankle edema Neuro: Alert & orientedx3, cranial nerves grossly intact. moves all 4 extremities w/o difficulty. Affect pleasant  Telemetry   NSR 70-80s with occasional PVCs, personally reviewed.   EKG    No new tracings.    Labs    CBC Recent Labs    11/11/18 1741  11/13/18 0537 11/14/18 0410  WBC 20.7*   < > 17.4* 14.9*  NEUTROABS 18.7*  --   --   --   HGB 11.4*   < > 9.8* 9.0*  HCT 34.0*   < > 28.3* 27.5*  MCV 84.2   < > 82.7 84.1  PLT 269   < > 244 183   < > = values in this  interval not displayed.   Basic Metabolic Panel Recent Labs    11/12/18 0354 11/13/18 0537 11/14/18 0410  NA 144 148* 150*  K 3.5 3.5 3.6  CL 114* 117* 118*  CO2 19* 19* 22  GLUCOSE 141* 104* 113*  BUN 98* 87* 78*  CREATININE 4.96* 3.71* 3.28*  CALCIUM 8.4* 8.7* 8.4*  MG 2.4  --   --   PHOS 4.3  --  4.1   Liver Function Tests Recent Labs    11/11/18 1741 11/13/18 0537 11/14/18 0410  AST 31 19  --   ALT 37 31  --   ALKPHOS 155* 117  --   BILITOT 2.4* 1.0  --   PROT 7.4 5.8*  --   ALBUMIN 2.9* 2.1* 1.9*   No results for input(s): LIPASE, AMYLASE in the last 72 hours. Cardiac Enzymes No results for input(s): CKTOTAL, CKMB, CKMBINDEX, TROPONINI in the last 72 hours.  BNP: BNP (last 3 results) No results for input(s): BNP in the last 8760 hours.  ProBNP (last 3 results) No results for input(s): PROBNP in the last 8760 hours.  D-Dimer No results for input(s): DDIMER in the last 72 hours. Hemoglobin A1C No results for input(s): HGBA1C in the last 72 hours. Fasting Lipid Panel No results for input(s): CHOL,  HDL, LDLCALC, TRIG, CHOLHDL, LDLDIRECT in the last 72 hours. Thyroid Function Tests No results for input(s): TSH, T4TOTAL, T3FREE, THYROIDAB in the last 72 hours.  Invalid input(s): FREET3  Other results:  Imaging    No results found.  Medications:     Scheduled Medications: . azithromycin  250 mg Oral Daily  . carvedilol  3.125 mg Oral BID WC  . heparin  5,000 Units Subcutaneous Q8H  . sodium chloride flush  3 mL Intravenous Once     Infusions: . sodium chloride 10 mL/hr at 11/13/18 1300  . cefTRIAXone (ROCEPHIN)  IV 2 g (11/13/18 1858)     PRN Medications:    Patient Profile   Miguel Roach is a 78 y.o. male with chronic systolic CHF, NICM, h/o VT with appropriate shock, PAF (short 10 minute episode noted on ICD), OSA, Gout, and CKD IV.   Presented to [facility] 11/12/2018 with fever and hypotension in setting of respiratory infection.  Admitted for sepsis. CHF team consulted with EF <30% and ? Cardiogenic component.   Assessment/Plan   1. Shock, Septic +/- cardiogenic component - BCx 1 of 4 are aerobic GPC and BCID showing Staph species (Methicillin sensitive) ? Contaminant.  Resp panel negative. MRSA PCR negative. UCx pending.   - Lactic acid to 1.4 11/11/2018. - Covering with Rocephin + Azithromycin. WBC remain elevated at 17.  - Afebrile.  - Off of NE 2. Acute on chronic systolic CHF, NICM CO preserved on RHC in 6/16, but 10/16 CPX showed moderate to severe functional impairment (seems worse than his symptoms would suggest). However, repeat CPX in 11/17, though submaximal, suggested no more than mild HF limitation. No significant CAD with coronary angiography in 6/16. No hx heavy ETOH or drug abuse. No marked HTN. Negative SPEP/UPEP. Possible prior myocarditis. ?familial cardiomyopathy =>mother had "weak heart" of uncertain etiology. He now has Medtronic ICD. Echo in 6/19 showed EF 25-30% (stable) with mild-moderate AI and mildly decreased RV systolic function. - Volume status looks OK on exam, to trending up.  - He was on lasix 80 mg am and 40 mg pm, alternating every other day with 80 mg BID. Will discuss resumption timing with Dr. Aundra Dubin.  - Continue coreg 3.125 mg BID - NE stopped 11/13/2018. BP remains 90-100s mostly.  - Holding hydral/imdur with hypotension. May be able to add back low doses soon.  - Holding digoxin with ARF - No ACE/ARB/ARNI or spiro with CKD IV.  3. ARF on CKD IV - Cr up to 5.5 on admission.  - Now down to 3.28 which is near his baseline.  - Follow closely.  4. OSA:  - CCM following.  5. H/o VT: Appropriate ICD discharge in 3/18, no recurrence.  - BB on hold with sepsis.  - Hold off on anti-arrhythmic unless VT recurs.  - Electrolytes OK. Supp K gently.  6. Atrial fibrillation: Paroxysmal. Very short episodes previously noted by device interrogation. He has had no prolonged or  symptomatic atrial fibrillation (data for anticoagulating short runs of atrial fibrillation is not strong), so will hold off on anticoagulation.  - If he develops prolonged or symptomatic atrial fibrillation, would anticoagulate.  - Follow closely with sepsis.  Medication concerns reviewed with patient and pharmacy team. Barriers identified: None at this time.   Length of Stay: 3  Annamaria Helling  11/14/2018, 8:50 AM  Advanced Heart Failure Team Pager [phone] (M-F; 7a - 4p)  Please contact [facility] for night-coverage after hours (4p -7a )  and weekends on [email or web address]   Patient seen with PA, agree with the above note.   He feels good today, BP has recovered.  Suspect respiratory infection drove this episode.    Restart home Lasix and will increase Coreg to 6.25 mg bid.  Hopefully back on all home meds tomorrow and home.   Walk in halls.   [name] 11/14/2018 1:22 PM

## 2018-11-14 NOTE — Progress Notes (Signed)
Monitor tech notified RN patient had 8 beat run of VT.  Patient denies symptoms.  BP 107/73.  On call Heart failure provider notified via [place].

## 2018-11-14 NOTE — Progress Notes (Signed)
   Notified of 3-5 beat runs of NSVT.   K 3.6 this am. Will supp gently and add Mg to be checked.   Pt asymptomatic. No further change at this time.    Legrand Como [id number] [place], PA-C 11/14/2018 10:42 AM

## 2018-11-14 NOTE — Progress Notes (Signed)
Monitor tech notified RN patient having frequent ectopy, 3-5 beat runs of VT.  PA [place] notified.

## 2018-11-15 DIAGNOSIS — E440 Moderate protein-calorie malnutrition: Secondary | ICD-10-CM

## 2018-11-15 LAB — CBC WITH DIFFERENTIAL/PLATELET
ABS IMMATURE GRANULOCYTES: 0.13 10*3/uL — AB (ref 0.00–0.07)
Basophils Absolute: 0 10*3/uL (ref 0.0–0.1)
Basophils Relative: 0 %
Eosinophils Absolute: 0 10*3/uL (ref 0.0–0.5)
Eosinophils Relative: 0 %
HCT: 28.1 % — ABNORMAL LOW (ref 39.0–52.0)
Hemoglobin: 9.6 g/dL — ABNORMAL LOW (ref 13.0–17.0)
Immature Granulocytes: 1 %
Lymphocytes Relative: 6 %
Lymphs Abs: 0.8 10*3/uL (ref 0.7–4.0)
MCH: 28.7 pg (ref 26.0–34.0)
MCHC: 34.2 g/dL (ref 30.0–36.0)
MCV: 83.9 fL (ref 80.0–100.0)
MONOS PCT: 9 %
Monocytes Absolute: 1.3 10*3/uL — ABNORMAL HIGH (ref 0.1–1.0)
Neutro Abs: 11.8 10*3/uL — ABNORMAL HIGH (ref 1.7–7.7)
Neutrophils Relative %: 84 %
Platelets: 202 10*3/uL (ref 150–400)
RBC: 3.35 MIL/uL — ABNORMAL LOW (ref 4.22–5.81)
RDW: 14.3 % (ref 11.5–15.5)
WBC: 14.1 10*3/uL — ABNORMAL HIGH (ref 4.0–10.5)
nRBC: 0 % (ref 0.0–0.2)

## 2018-11-15 LAB — BASIC METABOLIC PANEL
Anion gap: 10 (ref 5–15)
BUN: 67 mg/dL — ABNORMAL HIGH (ref 8–23)
CHLORIDE: 117 mmol/L — AB (ref 98–111)
CO2: 22 mmol/L (ref 22–32)
Calcium: 8.9 mg/dL (ref 8.9–10.3)
Creatinine, Ser: 2.99 mg/dL — ABNORMAL HIGH (ref 0.61–1.24)
GFR calc Af Amer: 22 mL/min — ABNORMAL LOW (ref >60)
GFR calc non Af Amer: 19 mL/min — ABNORMAL LOW (ref >60)
Glucose, Bld: 106 mg/dL — ABNORMAL HIGH (ref 70–99)
Potassium: 3.6 mmol/L (ref 3.5–5.1)
Sodium: 149 mmol/L — ABNORMAL HIGH (ref 135–145)

## 2018-11-15 MED ORDER — ENSURE ENLIVE PO LIQD
237.0000 mL | Freq: Two times a day (BID) | ORAL | Status: DC
  Administered 2018-11-15 – 2018-11-16 (×3): 237 mL via ORAL

## 2018-11-15 MED ORDER — ISOSORBIDE MONONITRATE ER 30 MG PO TB24
30.0000 mg | ORAL_TABLET | Freq: Every day | ORAL | Status: DC
  Administered 2018-11-16: 30 mg via ORAL
  Filled 2018-11-15 (×2): qty 1

## 2018-11-15 MED ORDER — DIGOXIN 125 MCG PO TABS
0.0625 mg | ORAL_TABLET | ORAL | Status: DC
  Filled 2018-11-15: qty 1

## 2018-11-15 MED ORDER — DIGOXIN 125 MCG PO TABS: 0.0625 mg | ORAL_TABLET | Freq: Every day | ORAL | Status: DC

## 2018-11-15 MED ORDER — HYDRALAZINE HCL 25 MG PO TABS
25.0000 mg | ORAL_TABLET | Freq: Three times a day (TID) | ORAL | Status: DC
  Filled 2018-11-15: qty 1

## 2018-11-15 MED ORDER — DIGOXIN 125 MCG PO TABS: 0.0625 mg | ORAL_TABLET | ORAL | Status: DC

## 2018-11-15 MED ORDER — ISOSORBIDE MONONITRATE 20 MG PO TABS
10.0000 mg | ORAL_TABLET | Freq: Every day | ORAL | Status: DC
  Administered 2018-11-15: 10 mg via ORAL
  Filled 2018-11-15: qty 1

## 2018-11-15 MED ORDER — POTASSIUM CHLORIDE CRYS ER 20 MEQ PO TBCR
40.0000 meq | EXTENDED_RELEASE_TABLET | Freq: Once | ORAL | Status: AC
  Administered 2018-11-15: 40 meq via ORAL
  Filled 2018-11-15: qty 2

## 2018-11-15 MED ORDER — HYDRALAZINE HCL 25 MG PO TABS
12.5000 mg | ORAL_TABLET | Freq: Three times a day (TID) | ORAL | Status: DC
  Administered 2018-11-15 – 2018-11-17 (×4): 12.5 mg via ORAL
  Filled 2018-11-15 (×5): qty 1

## 2018-11-15 MED ORDER — AMIODARONE HCL 200 MG PO TABS
200.0000 mg | ORAL_TABLET | Freq: Two times a day (BID) | ORAL | Status: DC
  Administered 2018-11-15 – 2018-11-17 (×5): 200 mg via ORAL
  Filled 2018-11-15 (×5): qty 1

## 2018-11-15 NOTE — Progress Notes (Addendum)
CARDIAC REHAB PHASE I   Pt educated with wife in the room. Educated on heart failure booklet, exercise guidelines, sodium and fluid intake, and PHII Cardiac Rehab. Referal sent to [place].   [phone]  Jeralyn Bennett BS, ACSM CEP 11/15/2018  11:01 AM

## 2018-11-15 NOTE — Progress Notes (Signed)
  Called by Nurse.  He is feeling a little dizzy. BP 92/50.  SBP  Soft. Hold 2 pm hydyralazine. Cut back hydralazine to 12.5 mg three times a day.  Amy Clegg NP-C  2:44 PM

## 2018-11-15 NOTE — Progress Notes (Addendum)
PROGRESS NOTE    Miguel Roach  UXN:[id number] DOB: 1941-04-14 DOA: 11/11/2018 PCP: Deland Pretty, MD    Brief Narrative:  78 year old male who presented with hypotension. He does have significant past medical history for chronic kidney disease, chronic systolic heart failure status post AICD, hypertension and sleep apnea. Patient reports 7 days of feeling weak, associated with productive cough and fever. He was noted to behypotensive at [facility] was refractory to IV fluids and required IV vasopressors. Transferred to [facility] care unit.His vital signs at time of transfer blood pressure 121/70, heart rate 85, temperature 98.3, respiratory rate21, oxygen saturation 99% on IV norepinephrine. pH 7.31, PCO2 66, PO2 73, bicarb 34.0, oxygen saturation 90%, sodium 144, potassium 3.5, chloride 114, bicarb 19, glucose 141, BUN 98, creatinine 4.96, white count 21.7, hemoglobin 9.6,hematocrit 26.4, platelets 230,his urinalysis was negative for infection. His a chest x-ray was negative for infiltrates, AICD in place.EKG sinus rhythm, left axis, poor R wave progression, intraventricular conduction delay.  Patient was admitted to the intensive care unit working diagnosis of hypotension to rule out septic shock.   Assessment & Plan:   Active Problems:   Sepsis ([place])   Septic shock ([place])   1.  Hypotension, sepsis was ruled out.  Patient was admitted to the intensive care unit, and was placed on IV antibiotic therapy, his cultures remain no growth, his chest x-ray did not show any pneumonic infiltrate, his urinalysis was negative for infection.  Vasopressors were rapidly discontinued without complications.  He was transferred to the telemetry and his antibiotics were discontinued.  He has remained hemodynamically stable.  Unclear reason for hypotension, possible viral syndrome combined with effects of diuretics and vasodilators.  2.  Acute on chronic systolic heart failure  decompensation.  LV EF 25-30%. Patient responded well to short course of vasopressors.  His cardiac agents were resumed including furosemide and carvedilol with good toleration.  Isosorbide dose has been decreased to 10 mg daily, with further up titration as an outpatient.  Carvedilol dose has been decreased by 50% as well. Continue holding digoxin due to decreased GFR. Patient placed on amiodarone for frequent PVCs and non sustained VT.   3.  Acute kidney injury chronic disease stage IV complicated by hypernatremia and hyperchloremia.  Patient received supportive medical therapy with vasopressors, his kidney function has been improving, discharge creatinine 2.9, potassium 3.6.  As a consequence of volume resuscitation with IV isotonic saline he developed hypernatremia and hyperchloremia that had been improving.  Discharge sodium 149, chloride 117, serum bicarbonate of 22.  4.  Reactive leukocytosis.  So far no signs of deep infection, continue to hold antibiotics, follow-up cell count next week.  Discharge white cell count 14.  5.  Gout.  No exacerbation, continue allopurinol.  6. Paroxysmal atrial fibrillation. Per device interrogation, continue rate and rhythm control, holding on anticoagulation for now per cardiology recommendations.   DVT prophylaxis: heparin   Code Status:  full Family Communication: I spoke with patient's family at the bedside and all questions were addressed.  Disposition Plan/ discharge barriers: Patient placed on amiodarone per cardiology for frequent PVCs.   Body mass index is 22.59 kg/m. Malnutrition Type:      Malnutrition Characteristics:      Nutrition Interventions:     RN Pressure Injury Documentation:     Consultants:   Cardiology   Procedures:     Antimicrobials:       Subjective: Patient is feeling better, no dyspnea or chest pain, no nausea  or vomiting. Has been out of the bed.   Objective: Vitals:   11/15/18 0434  11/15/18 0615 11/15/18 0931 11/15/18 0937  BP: 116/75 120/80 112/63   Pulse: (!) 58  84 87  Resp:      Temp: 98.1 F (36.7 C)     TempSrc: Oral     SpO2: 96%     Weight: 73.5 kg     Height:        Intake/Output Summary (Last 24 hours) at 11/15/2018 1017 Last data filed at 11/15/2018 0900 Gross per 24 hour  Intake 840 ml  Output 1595 ml  Net -755 ml   Filed Weights   11/13/18 1804 11/14/18 0436 11/15/18 0434  Weight: 74 kg 74 kg 73.5 kg    Examination:   General: Not in pain or dyspnea Neurology: Awake and alert, non focal  E ENT: no pallor, no icterus, oral mucosa moist Cardiovascular: No JVD. S1-S2 present, rhythmic, no gallops, rubs, or murmurs. No lower extremity edema. Pulmonary: positive breath sounds bilaterally, adequate air movement, no wheezing, rhonchi or rales. Gastrointestinal. Abdomen with no organomegaly, non tender, no rebound or guarding Skin. No rashes Musculoskeletal: no joint deformities   Data Reviewed: I have personally reviewed following labs and imaging studies  CBC: Recent Labs  Lab 11/11/18 1741 11/12/18 0311 11/12/18 0354 11/13/18 0537 11/14/18 0410 11/15/18 0534  WBC 20.7*  --  21.7* 17.4* 14.9* 14.1*  NEUTROABS 18.7*  --   --   --   --  11.8*  HGB 11.4* 11.9* 9.2* 9.8* 9.0* 9.6*  HCT 34.0* 35.0* 26.4* 28.3* 27.5* 28.1*  MCV 84.2  --  82.8 82.7 84.1 83.9  PLT 269  --  230 244 183 017   Basic Metabolic Panel: Recent Labs  Lab 11/11/18 1741 11/12/18 0311 11/12/18 0354 11/13/18 0537 11/14/18 0410 11/15/18 0534  NA 142 133* 144 148* 150* 149*  K 3.6 3.5 3.5 3.5 3.6 3.6  CL 105  --  114* 117* 118* 117*  CO2 21*  --  19* 19* 22 22  GLUCOSE 127*  --  141* 104* 113* 106*  BUN 112*  --  98* 87* 78* 67*  CREATININE 5.55*  --  4.96* 3.71* 3.28* 2.99*  CALCIUM 9.2  --  8.4* 8.7* 8.4* 8.9  MG  --   --  2.4  --  2.7*  --   PHOS  --   --  4.3  --  4.1  --    GFR: Estimated Creatinine Clearance: 21.5 mL/min (A) (by C-G formula based  on SCr of 2.99 mg/dL (H)). Liver Function Tests: Recent Labs  Lab 11/11/18 1741 11/13/18 0537 11/14/18 0410  AST 31 19  --   ALT 37 31  --   ALKPHOS 155* 117  --   BILITOT 2.4* 1.0  --   PROT 7.4 5.8*  --   ALBUMIN 2.9* 2.1* 1.9*   No results for input(s): LIPASE, AMYLASE in the last 168 hours. No results for input(s): AMMONIA in the last 168 hours. Coagulation Profile: Recent Labs  Lab 11/11/18 1741  INR 1.34   Cardiac Enzymes: No results for input(s): CKTOTAL, CKMB, CKMBINDEX, TROPONINI in the last 168 hours. BNP (last 3 results) No results for input(s): PROBNP in the last 8760 hours. HbA1C: No results for input(s): HGBA1C in the last 72 hours. CBG: Recent Labs  Lab 11/12/18 0044  GLUCAP 170*   Lipid Profile: No results for input(s): CHOL, HDL, LDLCALC, TRIG, CHOLHDL, LDLDIRECT in the  last 72 hours. Thyroid Function Tests: No results for input(s): TSH, T4TOTAL, FREET4, T3FREE, THYROIDAB in the last 72 hours. Anemia Panel: No results for input(s): VITAMINB12, FOLATE, FERRITIN, TIBC, IRON, RETICCTPCT in the last 72 hours.    Radiology Studies: I have reviewed all of the imaging during this hospital visit personally     Scheduled Meds: . allopurinol  150 mg Oral Daily  . amiodarone  200 mg Oral BID  . carvedilol  6.25 mg Oral BID WC  . furosemide  40 mg Oral QPM  . furosemide  80 mg Oral q morning - 10a  . heparin  5,000 Units Subcutaneous Q8H  . hydrALAZINE  25 mg Oral Q8H  . isosorbide mononitrate  10 mg Oral Daily  . potassium chloride  40 mEq Oral Once  . sodium chloride flush  3 mL Intravenous Once   Continuous Infusions: . sodium chloride 10 mL/hr at 11/13/18 1300     LOS: 4 days        Emersynn Deatley Gerome Apley, MD [facility] Pager [phone]

## 2018-11-15 NOTE — Progress Notes (Addendum)
Advanced Heart Failure Rounding Note  PCP-Cardiologist: No primary care provider on file.   Subjective:   Over the last 24 hours he has been having NSVT and PVCs.   Complaining of fatigue. Denies SOB.    Objective:   Weight Range: 73.5 kg Body mass index is 22.59 kg/m.   Vital Signs:   Temp:  [97.4 F (36.3 C)-98.1 F (36.7 C)] 98.1 F (36.7 C) (01/31 0434) Pulse Rate:  [58-90] 84 (01/31 0931) Resp:  [17-20] 18 (01/30 1955) BP: (95-120)/(56-85) 112/63 (01/31 0931) SpO2:  [96 %-99 %] 96 % (01/31 0434) Weight:  [73.5 kg] 73.5 kg (01/31 0434) Last BM Date: 11/14/18  Weight change: Filed Weights   11/13/18 1804 11/14/18 0436 11/15/18 0434  Weight: 74 kg 74 kg 73.5 kg   Intake/Output:   Intake/Output Summary (Last 24 hours) at 11/15/2018 0936 Last data filed at 11/15/2018 0900 Gross per 24 hour  Intake 840 ml  Output 1595 ml  Net -755 ml    Physical Exam    General:  IN bed.  No resp difficulty HEENT: normal Neck: supple. JVP 6-7 . Carotids 2+ bilat; no bruits. No lymphadenopathy or thryomegaly appreciated. Cor: PMI nondisplaced. Irregular rate & rhythm. No rubs, gallops or murmurs. Lungs: clear Abdomen: soft, nontender, nondistended. No hepatosplenomegaly. No bruits or masses. Good bowel sounds. Extremities: no cyanosis, clubbing, rash, edema Neuro: alert & orientedx3, cranial nerves grossly intact. moves all 4 extremities w/o difficulty. Affect pleasant  Telemetry   NSR with frequent PVCs and NSVT. 70-80s   EKG    No new tracings.    Labs    CBC Recent Labs    11/14/18 0410 11/15/18 0534  WBC 14.9* 14.1*  NEUTROABS  --  11.8*  HGB 9.0* 9.6*  HCT 27.5* 28.1*  MCV 84.1 83.9  PLT 183 314   Basic Metabolic Panel Recent Labs    11/14/18 0410 11/15/18 0534  NA 150* 149*  K 3.6 3.6  CL 118* 117*  CO2 22 22  GLUCOSE 113* 106*  BUN 78* 67*  CREATININE 3.28* 2.99*  CALCIUM 8.4* 8.9  MG 2.7*  --   PHOS 4.1  --    Liver Function  Tests Recent Labs    11/13/18 0537 11/14/18 0410  AST 19  --   ALT 31  --   ALKPHOS 117  --   BILITOT 1.0  --   PROT 5.8*  --   ALBUMIN 2.1* 1.9*   No results for input(s): LIPASE, AMYLASE in the last 72 hours. Cardiac Enzymes No results for input(s): CKTOTAL, CKMB, CKMBINDEX, TROPONINI in the last 72 hours.  BNP: BNP (last 3 results) No results for input(s): BNP in the last 8760 hours.  ProBNP (last 3 results) No results for input(s): PROBNP in the last 8760 hours.  D-Dimer No results for input(s): DDIMER in the last 72 hours. Hemoglobin A1C No results for input(s): HGBA1C in the last 72 hours. Fasting Lipid Panel No results for input(s): CHOL, HDL, LDLCALC, TRIG, CHOLHDL, LDLDIRECT in the last 72 hours. Thyroid Function Tests No results for input(s): TSH, T4TOTAL, T3FREE, THYROIDAB in the last 72 hours.  Invalid input(s): FREET3  Other results:  Imaging   No results found.  Medications:     Scheduled Medications: . allopurinol  150 mg Oral Daily  . carvedilol  6.25 mg Oral BID WC  . digoxin  0.0625 mg Oral QODAY  . furosemide  40 mg Oral QPM  . furosemide  80 mg  Oral q morning - 10a  . heparin  5,000 Units Subcutaneous Q8H  . hydrALAZINE  25 mg Oral Q8H  . isosorbide mononitrate  10 mg Oral Daily  . sodium chloride flush  3 mL Intravenous Once    Infusions: . sodium chloride 10 mL/hr at 11/13/18 1300    PRN Medications:    Patient Profile   Miguel Roach is a 78 y.o. male with chronic systolic CHF, NICM, h/o VT with appropriate shock, PAF (short 10 minute episode noted on ICD), OSA, Gout, and CKD IV.   [facility]ed [facility]/12/2018 with fever and hypotension in setting of respiratory infection. Admitted for sepsis. CHF team consulted with EF <30% and ? Cardiogenic component.   Assessment/Plan   1. Shock, Septic +/- cardiogenic component - BCx 1 of 4 are aerobic GPC and BCID showing Staph species (Methicillin sensitive) ? Contaminant.  Resp  panel negative. MRSA PCR negative. UCx pending.   - Lactic acid to 1.4 11/11/2018. - Completed antibiotic course.   - Afebrile.  2. Acute on chronic systolic CHF, NICM CO preserved on RHC in 6/16, but 10/16 CPX showed moderate to severe functional impairment (seems worse than his symptoms would suggest). However, repeat CPX in 11/17, though submaximal, suggested no more than mild HF limitation. No significant CAD with coronary angiography in 6/16. No hx heavy ETOH or drug abuse. No marked HTN. Negative SPEP/UPEP. Possible prior myocarditis. ?familial cardiomyopathy =>mother had "weak heart" of uncertain etiology. He now has [employer]tronic ICD. Echo in 6/19 showed EF 25-30% (stable) with mild-moderate AI and mildly decreased RV systolic function. Frequent PVCs.  Volume status stable. Hold off on diuresis.  Continue coreg 6.25 mg twice a day.  Hold dig with elevated creatinine for now.   - Continue hydralazine 25 mg three times a day and switch imdur to 30 mg at bed bedtime.   No ACE/ARB/ARNI or spiro with CKD IV.  - Renal function ok.  3. ARF on CKD IV - Cr up to 5.5 on admission.  - Creatinine down to 2.99 4. OSA:  - CCM following.  5. H/o VT: Appropriate ICD discharge in 3/18.  NSVT and frequent PVCs.  Continue coreg at current dose and add 200 mg amio twice a day to suppress PVCs.  Mag ok. Give 40 meq K x2.  -6. Atrial fibrillation: Paroxysmal. Very short episodes previously noted by device interrogation. He has had no prolonged or symptomatic atrial fibrillation (data for anticoagulating short runs of atrial fibrillation is not strong), so will hold off on anticoagulation.  - If he develops prolonged or symptomatic atrial fibrillation, would anticoagulate.  - Follow closely with sepsis.  Add amio today and watch.   Length of Stay: 4  Amy Clegg, NP  11/15/2018, 9:36 AM  Advanced Heart Failure Team Pager [phone] (M-F; 7a - 4p)  Please contact [facility] for night-coverage  after hours (4p -7a ) and weekends on [email or web address]   Patient seen with NP, agree with the above note.   Creatinine back down to his baseline, 2.99 today.  At this point with renal dysfunction, will hold off on starting back on digoxin.  He is back on Coreg at 6.25 mg bid, hydralazine 25 mg tid, and Imdur 30 daily.  BP seems to be at his baseline and he has been walking in the halls.  He is also back on his home dose of Lasix and looks euvolemic.   He has had very frequent PVCs and runs of NSVT noted.  Has history of VT with ICD discharge.  Will start amiodarone 200 mg bid and will keep his Coreg at lower dose 6.25 mg bid.   [name] 11/15/2018 1:46 PM

## 2018-11-15 NOTE — Progress Notes (Signed)
Patient had a 20 beat run of V tach. Upon assessment patient stated feeling "fine". Denied cp, palpitations or sob. HR 73, BP 120/80. Paged Md, will continue to monitor patient.

## 2018-11-15 NOTE — Progress Notes (Signed)
Initial Nutrition Assessment  DOCUMENTATION CODES:   Non-severe (moderate) malnutrition in context of chronic illness  INTERVENTION:    Ensure Enlive (350 kcal, 20 grams of protein each) or Boost Plus (360 kcal, 14 gm protein each) po BID  Encourage small, frequent meals  "Heart Failure Nutrition Therapy for the Undernourished" handout provided  NUTRITION DIAGNOSIS:   Moderate Malnutrition related to chronic illness(CHF) as evidenced by moderate fat depletion, moderate muscle depletion, percent weight loss(9% weight loss within 1 month).  GOAL:   Patient will meet greater than or equal to 90% of their needs  MONITOR:   PO intake, Supplement acceptance  REASON FOR ASSESSMENT:   Consult Assessment of nutrition requirement/status  ASSESSMENT:   78 yo male with PMH of HTN, NICM, AICD, CHF, and CKD IV who was admitted with hypotension, septic shock ruled out.  Patient and his wife report that he has been eating poorly for the past few weeks due to acute viral illness causing poor appetite. He has lost 12 lbs within the past 2 weeks. Per review of weight encounters, patient has lost 9% of usual weight within the past month, which is significant for the time frame.   Spoke with patient and wife about ways to increase protein and calorie intake within his low sodium, fluid restricted diet guidelines. Provided "Heart Failure Nutrition Therapy for the Undernourished" handout from the Academy of [employer]. Encouraged small, frequent, calorie and protein dense meals, including PO supplements BID. Provided coupons for Ensure and Boost supplements.   Labs and medications reviewed.   NUTRITION - FOCUSED PHYSICAL EXAM:    Most Recent Value  Orbital Region  Moderate depletion  Upper Arm Region  Moderate depletion  Thoracic and Lumbar Region  Severe depletion  Buccal Region  Moderate depletion  Clavicle Bone Region  Moderate depletion  Clavicle and Acromion Bone Region   Moderate depletion  Scapular Bone Region  Moderate depletion  Dorsal Hand  No depletion  Patellar Region  Mild depletion  Anterior Thigh Region  Mild depletion  Posterior Calf Region  Mild depletion  Edema (RD Assessment)  None  Hair  Reviewed  Eyes  Reviewed  Mouth  Reviewed  Skin  Reviewed  Nails  Reviewed       Diet Order:   Diet Order            Diet Heart Room service appropriate? Yes; Fluid consistency: Thin  Diet effective now              EDUCATION NEEDS:   Education needs have been addressed  Skin:  Skin Assessment: Reviewed RN Assessment  Last BM:  1/30  Height:   Ht Readings from Last 1 Encounters:  11/12/18 5\' 11"  (1.803 m)    Weight:   Wt Readings from Last 1 Encounters:  11/15/18 73.5 kg    Ideal Body Weight:  78.2 kg  BMI:  Body mass index is 22.59 kg/m.  Estimated Nutritional Needs:   Kcal:  2000-2200  Protein:  100-120 0gm  Fluid:  1.8 L    Molli Barrows, RD, LDN, [place] Pager [phone] After Hours Pager [phone]

## 2018-11-15 NOTE — Care Management Important Message (Signed)
Important Message  Patient Details  Name: Miguel Roach MRN: [id number] Date of Birth: 1941/03/02   Medicare Important Message Given:  Yes    [name] P [place] 11/15/2018, 3:00 PM

## 2018-11-16 DIAGNOSIS — E440 Moderate protein-calorie malnutrition: Secondary | ICD-10-CM

## 2018-11-16 DIAGNOSIS — D72829 Elevated white blood cell count, unspecified: Secondary | ICD-10-CM

## 2018-11-16 LAB — CULTURE, BLOOD (ROUTINE X 2)
Culture: NO GROWTH
SPECIAL REQUESTS: ADEQUATE

## 2018-11-16 LAB — CBC WITH DIFFERENTIAL/PLATELET
Abs Immature Granulocytes: 0.11 10*3/uL — ABNORMAL HIGH (ref 0.00–0.07)
Basophils Absolute: 0 10*3/uL (ref 0.0–0.1)
Basophils Relative: 0 %
Eosinophils Absolute: 0 10*3/uL (ref 0.0–0.5)
Eosinophils Relative: 0 %
HCT: 27.8 % — ABNORMAL LOW (ref 39.0–52.0)
Hemoglobin: 9.4 g/dL — ABNORMAL LOW (ref 13.0–17.0)
Immature Granulocytes: 1 %
Lymphocytes Relative: 7 %
Lymphs Abs: 1 10*3/uL (ref 0.7–4.0)
MCH: 28.6 pg (ref 26.0–34.0)
MCHC: 33.8 g/dL (ref 30.0–36.0)
MCV: 84.5 fL (ref 80.0–100.0)
Monocytes Absolute: 1.3 10*3/uL — ABNORMAL HIGH (ref 0.1–1.0)
Monocytes Relative: 10 %
Neutro Abs: 11.3 10*3/uL — ABNORMAL HIGH (ref 1.7–7.7)
Neutrophils Relative %: 82 %
Platelets: 181 10*3/uL (ref 150–400)
RBC: 3.29 MIL/uL — ABNORMAL LOW (ref 4.22–5.81)
RDW: 14.3 % (ref 11.5–15.5)
WBC: 13.8 10*3/uL — ABNORMAL HIGH (ref 4.0–10.5)
nRBC: 0 % (ref 0.0–0.2)

## 2018-11-16 LAB — BASIC METABOLIC PANEL
ANION GAP: 12 (ref 5–15)
BUN: 63 mg/dL — ABNORMAL HIGH (ref 8–23)
CO2: 19 mmol/L — ABNORMAL LOW (ref 22–32)
Calcium: 9 mg/dL (ref 8.9–10.3)
Chloride: 118 mmol/L — ABNORMAL HIGH (ref 98–111)
Creatinine, Ser: 3.07 mg/dL — ABNORMAL HIGH (ref 0.61–1.24)
GFR calc non Af Amer: 19 mL/min — ABNORMAL LOW (ref >60)
GFR, EST AFRICAN AMERICAN: 22 mL/min — AB (ref >60)
Glucose, Bld: 104 mg/dL — ABNORMAL HIGH (ref 70–99)
Potassium: 4.1 mmol/L (ref 3.5–5.1)
Sodium: 149 mmol/L — ABNORMAL HIGH (ref 135–145)

## 2018-11-16 NOTE — Progress Notes (Signed)
Advanced Heart Failure Rounding Note  PCP-Cardiologist: No primary care provider on file.   Subjective:     To have some nonsustained VT and PVCs.  Fatigue is improved.  Denies shortness of breath.   Objective:   Weight Range: 73.4 kg Body mass index is 22.57 kg/m.   Vitals:   11/16/18 0234 11/16/18 0618  BP: 113/69 (!) 106/53  Pulse: 73   Resp: 17   Temp: 98.4 F (36.9 C)   SpO2: 93%    Weight change: Filed Weights   11/14/18 0436 11/15/18 0434 11/16/18 0234  Weight: 74 kg 73.5 kg 73.4 kg   Intake/Output:   Intake/Output Summary (Last 24 hours) at 11/16/2018 0847 Last data filed at 11/16/2018 0239 Gross per 24 hour  Intake 960 ml  Output 750 ml  Net 210 ml    Physical Exam    GEN: Well nourished, well developed, in no acute distress  HEENT: normal  Neck: no JVD, carotid bruits, or masses Cardiac: RRR; no murmurs, rubs, or gallops,no edema  Respiratory:  clear to auscultation bilaterally, normal work of breathing GI: soft, nontender, nondistended, + BS MS: no deformity or atrophy  Skin: warm and dry, device site well healed Neuro:  Strength and sensation are intact Psych: euthymic mood, full affect   Telemetry   This rhythm with frequent PVCs and nonsustained VT  EKG    No new tracings.    Labs    CBC Recent Labs    11/15/18 0534 11/16/18 0433  WBC 14.1* 13.8*  NEUTROABS 11.8* 11.3*  HGB 9.6* 9.4*  HCT 28.1* 27.8*  MCV 83.9 84.5  PLT 202 950   Basic Metabolic Panel Recent Labs    11/14/18 0410 11/15/18 0534 11/16/18 0433  NA 150* 149* 149*  K 3.6 3.6 4.1  CL 118* 117* 118*  CO2 22 22 19*  GLUCOSE 113* 106* 104*  BUN 78* 67* 63*  CREATININE 3.28* 2.99* 3.07*  CALCIUM 8.4* 8.9 9.0  MG 2.7*  --   --   PHOS 4.1  --   --    Liver Function Tests Recent Labs    11/14/18 0410  ALBUMIN 1.9*   No results for input(s): LIPASE, AMYLASE in the last 72 hours. Cardiac Enzymes No results for input(s): CKTOTAL, CKMB, CKMBINDEX,  TROPONINI in the last 72 hours.  BNP: BNP (last 3 results) No results for input(s): BNP in the last 8760 hours.  ProBNP (last 3 results) No results for input(s): PROBNP in the last 8760 hours.  D-Dimer No results for input(s): DDIMER in the last 72 hours. Hemoglobin A1C No results for input(s): HGBA1C in the last 72 hours. Fasting Lipid Panel No results for input(s): CHOL, HDL, LDLCALC, TRIG, CHOLHDL, LDLDIRECT in the last 72 hours. Thyroid Function Tests No results for input(s): TSH, T4TOTAL, T3FREE, THYROIDAB in the last 72 hours.  Invalid input(s): FREET3  Other results:  Imaging   No results found.  Medications:     Scheduled Medications: . allopurinol  150 mg Oral Daily  . amiodarone  200 mg Oral BID  . carvedilol  6.25 mg Oral BID WC  . feeding supplement (ENSURE ENLIVE)  237 mL Oral BID BM  . furosemide  40 mg Oral QPM  . furosemide  80 mg Oral q morning - 10a  . heparin  5,000 Units Subcutaneous Q8H  . hydrALAZINE  12.5 mg Oral Q8H  . isosorbide mononitrate  30 mg Oral Daily  . sodium chloride flush  3  mL Intravenous Once    Infusions: . sodium chloride 10 mL/hr at 11/13/18 1300    PRN Medications:    Patient Profile   Miguel Roach is a 78 y.o. male with chronic systolic CHF, NICM, h/o VT with appropriate shock, PAF (short 10 minute episode noted on ICD), OSA, Gout, and CKD IV.   Presented to [facility]/12/2018 with fever and hypotension in setting of respiratory infection. Admitted for sepsis. CHF team consulted with EF <30% and ? Cardiogenic component.   Assessment/Plan   1. Shock, Septic +/- cardiogenic component - BCx 1 of 4 are aerobic GPC and BCID showing Staph species (Methicillin sensitive) ? Contaminant.  Resp panel negative. MRSA PCR negative. UCx pending.   Completed antibiotics.  Currently afebrile. 2. Acute on chronic systolic CHF, NICM CO preserved on RHC in 6/16, but 10/16 CPX showed moderate to severe functional impairment (seems  worse than his symptoms would suggest). However, repeat CPX in 11/17, though submaximal, suggested no more than mild HF limitation. No significant CAD with coronary angiography in 6/16. No hx heavy ETOH or drug abuse. No marked HTN. Negative SPEP/UPEP. Possible prior myocarditis. ?familial cardiomyopathy =>mother had "weak heart" of uncertain etiology. He now has [employer] ICD. Echo in 6/19 showed EF 25-30% (stable) with mild-moderate AI and mildly decreased RV systolic function. Frequent PVCs.  Volume status stable. Hold off on diuresis.  Continue Coreg Continue hydralazine, which was decreased yesterday.  Continue to hold digoxin.  Started Imdur at bedtime.  No ACE inhibitor/ARB/Aldactone with stage IV CKD. 3. ARF on CKD IV Creatinine 5.5 on admission.  Is come down to 3 today.  Has been stable over the last 2 days. 4. OSA:  CCM following 5. H/o VT: Appropriate ICD discharge in 3/18.  Potassium and magnesium have remained stable.  Is currently on amiodarone.  He has continued to have nonsustained VT.  We Ettore Trebilcock continue amiodarone at the current dose.  He likely has not had a full load and thus needs more medication to suppress VT. -6. Atrial fibrillation: Currently not anticoagulated due to very short episodes.  Continue amiodarone.   If he develops more atrial fibrillation, would plan for anticoagulation.  She does nonsustained VT settle down throughout the day today, would be amenable for discharge.  Would discharge on current medications.  He needs twice daily amiodarone at discharge 200 mg.  Should he have further VT, would increase to 400 mg twice a day.  Length of Stay: 6  Esai Stecklein Meredith Leeds, MD  11/16/2018, 8:47 AM

## 2018-11-16 NOTE — Progress Notes (Signed)
CARDIAC REHAB PHASE I   Pt in chair when entered the room. Pt stated they had just finished with PT and had walked this morning. Encouraged Pt to walk at least one more time today. Discussed with Pt and wife importance of HF zones, sodium intake, and importance of weighing everyday. CRPII discussed and referral sent to [facility].   [place] BS, ACSM CEP 11/16/2018  9:42 AM

## 2018-11-16 NOTE — Evaluation (Signed)
Physical Therapy Evaluation Patient Details Name: Miguel Roach MRN: [id number] DOB: 05/24/1941 Today's Date: 11/16/2018   History of Present Illness  78 year old male who presented with hypotension.  He does have significant past medical history for chronic kidney disease, chronic systolic heart failure status post AICD, hypertension and sleep apnea.  Patient reports 7 days of feeling weak, associated with productive cough and fever.  He was noted to be hypotensive at [facility], which was refractory to IV fluids and required IV vasopressors.  Transferred to [facility] critical care unit.  Clinical Impression  Pt admitted with above diagnosis. Pt currently with functional limitations due to the deficits listed below (see PT Problem List). Pt was able to ambulate without device with good stability in controlled environment.  Pt HR 78-95 bpm with good tolerance and no symptoms of dyspnea or fatigue.  Pt feels he is close to baseline.  Will follow acutely.  Pt will benefit from skilled PT to increase their independence and safety with mobility to allow discharge to the venue listed below.      Follow Up Recommendations No PT follow up    Equipment Recommendations  None recommended by PT    Recommendations for Other Services       Precautions / Restrictions Precautions Precautions: None Restrictions Weight Bearing Restrictions: No      Mobility  Bed Mobility Overal bed mobility: Independent                Transfers Overall transfer level: Independent                  Ambulation/Gait Ambulation/Gait assistance: Supervision Gait Distance (Feet): 300 Feet Assistive device: None Gait Pattern/deviations: Step-through pattern;Decreased stride length   Gait velocity interpretation: 1.31 - 2.62 ft/sec, indicative of limited community ambulator General Gait Details: Pt walking well without device and without LOB.  CAn withstand min challenges to balance as well.    Stairs Stairs: Yes Stairs assistance: Supervision Stair Management: One rail Left;Alternating pattern;Forwards Number of Stairs: 15 General stair comments: No issues on steps  Wheelchair Mobility    Modified Rankin (Stroke Patients Only)       Balance Overall balance assessment: Independent                                           Pertinent Vitals/Pain Pain Assessment: No/denies pain    Home Living Family/patient expects to be discharged to:: Private residence Living Arrangements: Spouse/significant other Available Help at Discharge: Family;Available 24 hours/day Type of Home: House Home Access: Level entry     Home Layout: Two level;Bed/bath upstairs;1/2 bath on main level Home Equipment: None      Prior Function Level of Independence: Independent         Comments: Drives     Hand Dominance   Dominant Hand: Right    Extremity/Trunk Assessment   Upper Extremity Assessment Upper Extremity Assessment: Defer to OT evaluation    Lower Extremity Assessment Lower Extremity Assessment: Overall WFL for tasks assessed    Cervical / Trunk Assessment Cervical / Trunk Assessment: Normal  Communication   Communication: No difficulties  Cognition Arousal/Alertness: Awake/alert Behavior During Therapy: WFL for tasks assessed/performed Overall Cognitive Status: Within Functional Limits for tasks assessed  General Comments      Exercises     Assessment/Plan    PT Assessment Patient needs continued PT services  PT Problem List Decreased activity tolerance;Decreased balance;Decreased mobility;Decreased knowledge of use of DME;Decreased safety awareness;Decreased knowledge of precautions;Cardiopulmonary status limiting activity       PT Treatment Interventions DME instruction;Gait training;Functional mobility training;Stair training;Therapeutic activities;Therapeutic  exercise;Balance training;Patient/family education    PT Goals (Current goals can be found in the Care Plan section)  Acute Rehab PT Goals Patient Stated Goal: to go home PT Goal Formulation: With patient Time For Goal Achievement: 11/30/18 Potential to Achieve Goals: Good    Frequency Min 3X/week   Barriers to discharge        Co-evaluation               AM-PAC PT "6 Clicks" Mobility  Outcome Measure Help needed turning from your back to your side while in a flat bed without using bedrails?: None Help needed moving from lying on your back to sitting on the side of a flat bed without using bedrails?: None Help needed moving to and from a bed to a chair (including a wheelchair)?: None Help needed standing up from a chair using your arms (e.g., wheelchair or bedside chair)?: None Help needed to walk in hospital room?: A Little Help needed climbing 3-5 steps with a railing? : A Little 6 Click Score: 22    End of Session Equipment Utilized During Treatment: Gait belt Activity Tolerance: Patient limited by fatigue Patient left: in chair;with call bell/phone within reach;with chair alarm set;with family/visitor present Nurse Communication: Mobility status PT Visit Diagnosis: Muscle weakness (generalized) (M62.81)    Time: 4599-7741 PT Time Calculation (min) (ACUTE ONLY): 31 min   Charges:   PT Evaluation $PT Eval Moderate Complexity: 1 Mod PT Treatments $Gait Training: 8-22 mins        [place] Pager:  [phone]  Office:  [phone]    Denice Paradise 11/16/2018, 10:35 AM

## 2018-11-16 NOTE — Progress Notes (Signed)
Patient had 23 beats NSVT per CCMD. On call Jeannette Corpus, NP made aware. Patient was asleep at the time. No new orders. Will continue to monitor.

## 2018-11-16 NOTE — Progress Notes (Signed)
PROGRESS NOTE    Miguel Roach  AOZ:[id number] DOB: 1941/10/10 DOA: 11/11/2018 PCP: Deland Pretty, MD    Brief Narrative:  78 year old male who presented with hypotension. He does have significant past medical history for chronic kidney disease, chronic systolic heart failure status post AICD, hypertension and sleep apnea. Patient reports 7 days of feeling weak, associated with productive cough and fever. He was noted to behypotensive at [facility] was refractory to IV fluids and required IV vasopressors. Transferred to [facility] care unit.His vital signs at time of transfer blood pressure 121/70, heart rate 85, temperature 98.3, respiratory rate21, oxygen saturation 99% on IV norepinephrine. pH 7.31, PCO2 66, PO2 73, bicarb 34.0, oxygen saturation 90%, sodium 144, potassium 3.5, chloride 114, bicarb 19, glucose 141, BUN 98, creatinine 4.96, white count 21.7, hemoglobin 9.6,hematocrit 26.4, platelets 230,his urinalysis was negative for infection. His a chest x-ray was negative for infiltrates, AICD in place.EKG sinus rhythm, left axis, poor R wave progression, intraventricular conduction delay.  Patient was admitted to the intensive care unit working diagnosis of hypotension to rule out septic shock.   Assessment & Plan:   Active Problems:   Sepsis ([place])   Septic shock ([facility])   Malnutrition of moderate degree  1.Hypotension, sepsis was ruled out. Patient continue hemodynamically stable, tolerating well hydralazine and isosorbide. No signs of deep infection, continue to hold on antibiotic therapy. Systolic blood pressure today 113 and 106 mmHg.   2.Acute on chronic systolic heart failure decompensation/ complicated with non sustained VT. LV EF 25-30%. Continue heart failure management with isosorbide, hydralazine and carvedilol. Continue diuresis with furosemide to keep negative fluid balance. Patient placed on amiodarone to suppress PVC and non sustained VT,  will continue telemetry monitoring. Blood pressure systolic has been 98 to 962 over last 24 H.   3.Acute kidney injury chronic disease stage IV complicated by hypernatremia and hyperchloremia. Stable renal function with serum cr at 3,0 with K at 4,1 and serum bicarbonate at 19. Will follow on renal panel in am.   4.Reactive leukocytosis. Continue to trend down wbc, today at 13,8,. No indication for antibiotic therapy.   5.Gout. No clinical signs of exacerbation, on allopurinol.  6. Paroxysmal atrial fibrillation. Patient placed on amiodarone, continue carvedilol. No anticoagulation for now per cardiology recommendation, noted infrequent episodes per device interrogation.   DVT prophylaxis: heparin   Code Status:  full Family Communication: I spoke with patient's family at the bedside and all questions were addressed  Body mass index is 22.57 kg/m. Malnutrition Type:  Nutrition Problem: Moderate Malnutrition Etiology: chronic illness(CHF)   Malnutrition Characteristics:  Signs/Symptoms: moderate fat depletion, moderate muscle depletion, percent weight loss(9% weight loss within 1 month) Percent weight loss: 9 %   Nutrition Interventions:  Interventions: Ensure Enlive (each supplement provides 350kcal and 20 grams of protein), Education, Boost Plus  RN Pressure Injury Documentation:     Consultants:   EP   Procedures:     Antimicrobials:       Subjective: Patient continue to have PVC and non sustained VT, no nausea or vomiting, no chest pain or dyspnea.  Objective: Vitals:   11/15/18 1946 11/16/18 0052 11/16/18 0234 11/16/18 0618  BP: 101/70 94/64 113/69 (!) 106/53  Pulse: 72 75 73   Resp: 16  17   Temp: 98.8 F (37.1 C)  98.4 F (36.9 C)   TempSrc: Oral  Oral   SpO2: 97%  93%   Weight:   73.4 kg   Height:  Intake/Output Summary (Last 24 hours) at 11/16/2018 1024 Last data filed at 11/16/2018 0848 Gross per 24 hour  Intake 960 ml    Output 550 ml  Net 410 ml   Filed Weights   11/14/18 0436 11/15/18 0434 11/16/18 0234  Weight: 74 kg 73.5 kg 73.4 kg    Examination:   General: Not in pain or dyspnea, deconditioned  Neurology: Awake and alert, non focal  E ENT: mild pallor, no icterus, oral mucosa moist Cardiovascular: No JVD. S1-S2 present, rhythmic, no gallops, rubs, or murmurs. No lower extremity edema. Pulmonary: positive breath sounds bilaterally, adequate air movement, no wheezing, rhonchi or rales. Gastrointestinal. Abdomen with no organomegaly, non tender, no rebound or guarding Skin. No rashes Musculoskeletal: no joint deformities     Data Reviewed: I have personally reviewed following labs and imaging studies  CBC: Recent Labs  Lab 11/11/18 1741  11/12/18 0354 11/13/18 0537 11/14/18 0410 11/15/18 0534 11/16/18 0433  WBC 20.7*  --  21.7* 17.4* 14.9* 14.1* 13.8*  NEUTROABS 18.7*  --   --   --   --  11.8* 11.3*  HGB 11.4*   < > 9.2* 9.8* 9.0* 9.6* 9.4*  HCT 34.0*   < > 26.4* 28.3* 27.5* 28.1* 27.8*  MCV 84.2  --  82.8 82.7 84.1 83.9 84.5  PLT 269  --  230 244 183 202 181   < > = values in this interval not displayed.   Basic Metabolic Panel: Recent Labs  Lab 11/12/18 0354 11/13/18 0537 11/14/18 0410 11/15/18 0534 11/16/18 0433  NA 144 148* 150* 149* 149*  K 3.5 3.5 3.6 3.6 4.1  CL 114* 117* 118* 117* 118*  CO2 19* 19* 22 22 19*  GLUCOSE 141* 104* 113* 106* 104*  BUN 98* 87* 78* 67* 63*  CREATININE 4.96* 3.71* 3.28* 2.99* 3.07*  CALCIUM 8.4* 8.7* 8.4* 8.9 9.0  MG 2.4  --  2.7*  --   --   PHOS 4.3  --  4.1  --   --    GFR: Estimated Creatinine Clearance: 20.9 mL/min (A) (by C-G formula based on SCr of 3.07 mg/dL (H)). Liver Function Tests: Recent Labs  Lab 11/11/18 1741 11/13/18 0537 11/14/18 0410  AST 31 19  --   ALT 37 31  --   ALKPHOS 155* 117  --   BILITOT 2.4* 1.0  --   PROT 7.4 5.8*  --   ALBUMIN 2.9* 2.1* 1.9*   No results for input(s): LIPASE, AMYLASE in the  last 168 hours. No results for input(s): AMMONIA in the last 168 hours. Coagulation Profile: Recent Labs  Lab 11/11/18 1741  INR 1.34   Cardiac Enzymes: No results for input(s): CKTOTAL, CKMB, CKMBINDEX, TROPONINI in the last 168 hours. BNP (last 3 results) No results for input(s): PROBNP in the last 8760 hours. HbA1C: No results for input(s): HGBA1C in the last 72 hours. CBG: Recent Labs  Lab 11/12/18 0044  GLUCAP 170*   Lipid Profile: No results for input(s): CHOL, HDL, LDLCALC, TRIG, CHOLHDL, LDLDIRECT in the last 72 hours. Thyroid Function Tests: No results for input(s): TSH, T4TOTAL, FREET4, T3FREE, THYROIDAB in the last 72 hours. Anemia Panel: No results for input(s): VITAMINB12, FOLATE, FERRITIN, TIBC, IRON, RETICCTPCT in the last 72 hours.    Radiology Studies: I have reviewed all of the imaging during this hospital visit personally     Scheduled Meds: . allopurinol  150 mg Oral Daily  . amiodarone  200 mg Oral BID  .  carvedilol  6.25 mg Oral BID WC  . feeding supplement (ENSURE ENLIVE)  237 mL Oral BID BM  . furosemide  40 mg Oral QPM  . furosemide  80 mg Oral q morning - 10a  . heparin  5,000 Units Subcutaneous Q8H  . hydrALAZINE  12.5 mg Oral Q8H  . isosorbide mononitrate  30 mg Oral Daily  . sodium chloride flush  3 mL Intravenous Once   Continuous Infusions: . sodium chloride 10 mL/hr at 11/13/18 1300     LOS: 5 days        Mauricio Gerome Apley, MD

## 2018-11-17 LAB — BASIC METABOLIC PANEL
ANION GAP: 11 (ref 5–15)
BUN: 65 mg/dL — ABNORMAL HIGH (ref 8–23)
CO2: 21 mmol/L — ABNORMAL LOW (ref 22–32)
Calcium: 9 mg/dL (ref 8.9–10.3)
Chloride: 115 mmol/L — ABNORMAL HIGH (ref 98–111)
Creatinine, Ser: 3.26 mg/dL — ABNORMAL HIGH (ref 0.61–1.24)
GFR calc Af Amer: 20 mL/min — ABNORMAL LOW (ref >60)
GFR calc non Af Amer: 17 mL/min — ABNORMAL LOW (ref >60)
Glucose, Bld: 96 mg/dL (ref 70–99)
Potassium: 4 mmol/L (ref 3.5–5.1)
Sodium: 147 mmol/L — ABNORMAL HIGH (ref 135–145)

## 2018-11-17 MED ORDER — HYDRALAZINE HCL 25 MG PO TABS: 12.5000 mg | ORAL_TABLET | Freq: Three times a day (TID) | ORAL | 0 refills | Status: DC

## 2018-11-17 MED ORDER — AMIODARONE HCL 200 MG PO TABS: 200.0000 mg | ORAL_TABLET | Freq: Two times a day (BID) | ORAL | 0 refills | Status: DC

## 2018-11-17 MED ORDER — ENSURE ENLIVE PO LIQD: 237.0000 mL | Freq: Two times a day (BID) | ORAL | 0 refills | Status: AC

## 2018-11-17 MED ORDER — CARVEDILOL 6.25 MG PO TABS: 6.2500 mg | ORAL_TABLET | Freq: Two times a day (BID) | ORAL | 0 refills | Status: DC

## 2018-11-17 NOTE — Discharge Summary (Signed)
Physician Discharge Summary  Miguel Roach ZCH:[id number] DOB: [birth year] DOA: 11/11/2018  PCP: Deland Pretty, MD  Admit date: 11/11/2018 Discharge date: 11/17/2018  Admitted From: Home  Disposition:  Home   Recommendations for Outpatient Follow-up and new medication changes:  1. Follow up with Dr. Shelia Media in 7 days.  2. Follow up with the [facility] 11/21/2018 3. Patient has been placed on amiodarone 200 mg bid to suppress non sustained ventricular tachycardia.  4. Carvedilol has been decreased to 6,25 mg bid. 5. Hydralazine has been decreased to 12,5 mg tid. 6. Digoxin has been discontinued due to decreased GFR.  7. Not on anticoagulation for paroxysmal atrial fibrillation due to low burden.  8. Patient placed on nutritional supplements.   Home Health: no   Equipment/Devices: no    Discharge Condition: stable  CODE STATUS: full  Diet recommendation: heart healthy diet.  Brief/Interim Summary: 78 year old male who presented with hypotension. He does have significant past medical history for chronic kidney disease, chronic systolic heart failure status post AICD, hypertension and sleep apnea. Patient reports 7 days of feeling weak, associated with productive cough and fever. He was noted to behypotensive at [facility] was refractory to IV fluids and required IV vasopressors. Transferred to [facility] care unit.His vital signs at time of transfer blood pressure 121/70, heart rate 85, temperature 98.3, respiratory rate21, oxygen saturation 99% on IV norepinephrine. pH 7.31, PCO2 66, PO2 73, bicarb 34.0, oxygen saturation 90%, sodium 144, potassium 3.5, chloride 114, bicarb 19, glucose 141, BUN 98, creatinine 4.96, white count 21.7, hemoglobin 9.6,hematocrit 26.4, platelets 230,his urinalysis was negative for infection. His a chest x-ray was negative for infiltrates, AICD in place.EKG sinus rhythm, left axis, poor R wave progression, intraventricular conduction  delay.  Patient was admitted to the intensive care unit working diagnosis of hypotension to rule out septic shock.  1.  Hypotension likely multifactorial, medication induced, sepsis was ruled out.  Patient regained hemodynamic stability and was rapidly taper off vasopressors.  His cultures remain no growth, no signs of infection, antibiotics were discontinued.  His heart failure regimen was modified to avoid further hypotension including decreased dose of carvedilol and hydralazine.  Diuresis has been resumed with furosemide.  His discharge blood pressure 113/75.  2.  Acute on chronic systolic heart failure decompensation.  Complicated by nonsustained ventricular tachycardia.  Left ventricle ejection fraction 25 to 30%.  Patient required vasopressors at the time of transfer which was rapidly discontinued, his diuretic regimen was resumed with oral furosemide with good toleration.  His afterload reducing agents were modified to avoid further hypotension, and carvedilol dose was decreased by 50%.  Patient was placed on amiodarone to suppress episodes of nonsustained ventricular tachycardia.  He will continue 200 mg twice daily at discharge.  Digoxin has been discontinued due to decreased GFR.  3.  Acute kidney injury on chronic kidney disease stage IV complicated by hypernatremia and hyperchloremia.  Patient received supportive hemodynamic measures including vasopressors and IV with isotonic fluids.  After regaining hemodynamic stability diuresis was resumed.  His discharge creatinine is 3.2, potassium 4.0 sodium 147, chloride 115 and serum bicarb 21.  4.  Paroxysmal atrial fibrillation.  Patient will continue rate control with carvedilol, rhythm control with amiodarone, currently off anticoagulation due to low atrial fibrillation burden.  5.  Reactive leukocytosis.  No signs of deep infection, antibiotics were discontinued.  6.  Gout.  We will no exacerbation, continue allopurinol.  7.  Moderate  calorie protein malnutrition.  Patient was  placed on nutritional supplements, patient was seen by physical therapy.  Discharge Diagnoses:  Active Problems:   Sepsis ([place])   Septic shock ([facility])   Malnutrition of moderate degree    Discharge Instructions  Discharge Instructions    Amb Referral to Cardiac Rehabilitation   Complete by:  As directed    Diagnosis:  Heart Failure (see criteria below if ordering Phase II)   Heart Failure Type:  Chronic Systolic     Allergies as of 11/17/2018      Reactions   Benicar [olmesartan] Cough      Medication List    STOP taking these medications   digoxin 0.125 MG tablet Commonly known as:  LANOXIN     TAKE these medications   allopurinol 100 MG tablet Commonly known as:  ZYLOPRIM TAKE 2 TABLETS (200 MG TOTAL) BY MOUTH DAILY. What changed:    how much to take  when to take this   amiodarone 200 MG tablet Commonly known as:  PACERONE Take 1 tablet (200 mg total) by mouth 2 (two) times daily for 30 days.   AURYXIA 1 GM 210 MG(Fe) tablet Generic drug:  ferric citrate Take 210 mg by mouth daily.   carvedilol 6.25 MG tablet Commonly known as:  COREG Take 1 tablet (6.25 mg total) by mouth 2 (two) times daily with a meal for 30 days. What changed:    medication strength  how much to take   colchicine 0.6 MG tablet Take 0.6 mg by mouth daily as needed (gout flare up).   feeding supplement (ENSURE ENLIVE) Liqd Take 237 mLs by mouth 2 (two) times daily between meals for 30 days.   furosemide 40 MG tablet Commonly known as:  LASIX Take 2 tabs (80 mg total) by mouth every morning and 1 tab (40 mg total) every evening alternating with 2 tabs (80 mg total) twice a day. What changed:    how much to take  how to take this  when to take this  additional instructions   hydrALAZINE 25 MG tablet Commonly known as:  APRESOLINE Take 0.5 tablets (12.5 mg total) by mouth every 8 (eight) hours for 30 days. What changed:    how  much to take  when to take this   isosorbide mononitrate 30 MG 24 hr tablet Commonly known as:  IMDUR TAKE 1 TABLET (30 MG TOTAL) BY MOUTH DAILY. What changed:    when to take this  Another medication with the same name was removed. Continue taking this medication, and follow the directions you see here.   Vitamin D3 125 MCG (5000 UT) Tabs Take 5,000 Units by mouth every morning.       Allergies  Allergen Reactions  . Benicar [Olmesartan] Cough    Consultations:  Cardiolgy    Procedures/Studies: Dg Chest 2 View  Result Date: 11/11/2018 CLINICAL DATA:  78 year old male weakness fever and decreased appetite EXAM: CHEST - 2 VIEW COMPARISON:  11/25/2015 FINDINGS: Cardiomediastinal silhouette unchanged in size and contour. Cardiac pacing device/AICD on left chest wall, unchanged. Linear opacities at the lung bases, similar to the comparison with blunting of the costophrenic sulcus on the lateral view. No pneumothorax.  No confluent airspace disease. IMPRESSION: Chronic lung changes with emphysema and basilar scarring and no evidence of acute cardiopulmonary disease. Unchanged left-sided AICD Electronically Signed   By: Corrie Mckusick D.O.   On: 11/11/2018 18:13       Subjective: Patient is feeling better, no nausea or vomiting, no chest pain  or dyspnea. Out of bed.   Discharge Exam: Vitals:   11/17/18 0501 11/17/18 0828  BP: 113/63 113/75  Pulse: 79 76  Resp: 19   Temp: 98.5 F (36.9 C)   SpO2: 96%    Vitals:   11/16/18 2005 11/16/18 2203 11/17/18 0501 11/17/18 0828  BP: 101/62 104/65 113/63 113/75  Pulse: 67  79 76  Resp: 17  19   Temp: 98.4 F (36.9 C)  98.5 F (36.9 C)   TempSrc: Oral  Oral   SpO2: 97%  96%   Weight:   73.3 kg   Height:        General: Not in pain or dyspnea.  Neurology: Awake and alert, non focal  E ENT: no pallor, no icterus, oral mucosa moist Cardiovascular: No JVD. S1-S2 present, rhythmic, no gallops, rubs, or murmurs. No lower  extremity edema. Pulmonary: positive breath sounds bilaterally, adequate air movement, no wheezing, rhonchi or rales. Gastrointestinal. Abdomen with, no organomegaly, non tender, no rebound or guarding Skin. No rashes Musculoskeletal: no joint deformities   The results of significant diagnostics from this hospitalization (including imaging, microbiology, ancillary and laboratory) are listed below for reference.     Microbiology: Recent Results (from the past 240 hour(s))  Culture, blood (Routine x 2)     Status: None   Collection Time: 11/11/18  5:35 PM  Result Value Ref Range Status   Specimen Description BLOOD BLOOD RIGHT ARM  Final   Special Requests   Final    BOTTLES DRAWN AEROBIC AND ANAEROBIC Blood Culture adequate volume Performed at [facility], [address] [place], [place] [address]    Culture NO GROWTH 5 DAYS  Final   Report Status 11/16/2018 FINAL  Final  Culture, blood (Routine x 2)     Status: Abnormal   Collection Time: 11/11/18  5:45 PM  Result Value Ref Range Status   Specimen Description   Final    BLOOD BLOOD LEFT ARM Performed at [facility], [address] [place], [place] [address]    Special Requests   Final    BOTTLES DRAWN AEROBIC AND ANAEROBIC Blood Culture adequate volume Performed at [facility], [address] [place], [place] [address]    Culture  Setup Time   Final    AEROBIC BOTTLE ONLY GRAM POSITIVE COCCI Organism ID to follow CRITICAL RESULT CALLED TO, READ BACK BY AND VERIFIED WITH: J MILLEN PHARMD 11/12/18 1755 JDW    Culture (A)  Final    STAPHYLOCOCCUS SPECIES (COAGULASE NEGATIVE) THE SIGNIFICANCE OF ISOLATING THIS ORGANISM FROM A SINGLE SET OF BLOOD CULTURES WHEN MULTIPLE SETS ARE DRAWN IS UNCERTAIN. PLEASE NOTIFY THE MICROBIOLOGY DEPARTMENT WITHIN ONE WEEK IF SPECIATION AND SENSITIVITIES ARE REQUIRED. Performed at [facility], [place] [address] [place], [place] [address]    Report Status  11/14/2018 FINAL  Final  Blood Culture ID Panel (Reflexed)     Status: Abnormal   Collection Time: 11/11/18  5:45 PM  Result Value Ref Range Status   Enterococcus species NOT DETECTED NOT DETECTED Final   Listeria monocytogenes NOT DETECTED NOT DETECTED Final   Staphylococcus species DETECTED (A) NOT DETECTED Final    Comment: Methicillin (oxacillin) susceptible coagulase negative staphylococcus. Possible blood culture contaminant (unless isolated from more than one blood culture draw or clinical case suggests pathogenicity). No antibiotic treatment is indicated for blood  culture contaminants. CRITICAL RESULT CALLED TO, READ BACK BY AND VERIFIED WITH: J  MILLEN PHARMD 11/12/18 1755 JDW    Staphylococcus aureus (BCID) NOT DETECTED NOT DETECTED Final   Methicillin resistance NOT DETECTED NOT DETECTED Final   Streptococcus species NOT DETECTED NOT DETECTED Final   Streptococcus agalactiae NOT DETECTED NOT DETECTED Final   Streptococcus pneumoniae NOT DETECTED NOT DETECTED Final   Streptococcus pyogenes NOT DETECTED NOT DETECTED Final   Acinetobacter baumannii NOT DETECTED NOT DETECTED Final   Enterobacteriaceae species NOT DETECTED NOT DETECTED Final   Enterobacter cloacae complex NOT DETECTED NOT DETECTED Final   Escherichia coli NOT DETECTED NOT DETECTED Final   Klebsiella oxytoca NOT DETECTED NOT DETECTED Final   Klebsiella pneumoniae NOT DETECTED NOT DETECTED Final   Proteus species NOT DETECTED NOT DETECTED Final   Serratia marcescens NOT DETECTED NOT DETECTED Final   Haemophilus influenzae NOT DETECTED NOT DETECTED Final   Neisseria meningitidis NOT DETECTED NOT DETECTED Final   Pseudomonas aeruginosa NOT DETECTED NOT DETECTED Final   Candida albicans NOT DETECTED NOT DETECTED Final   Candida glabrata NOT DETECTED NOT DETECTED Final   Candida krusei NOT DETECTED NOT DETECTED Final   Candida parapsilosis NOT DETECTED NOT DETECTED Final   Candida tropicalis NOT DETECTED NOT DETECTED  Final    Comment: Performed at [facility], [place] [address] [place], [place] [address]  Urine culture     Status: None   Collection Time: 11/11/18  6:08 PM  Result Value Ref Range Status   Specimen Description   Final    URINE, RANDOM Performed at [facility], [address] [place], [place] [address]    Special Requests   Final    NONE Performed at [facility], [address] [place], [place] [address]    Culture   Final    NO GROWTH Performed at [facility], [place] [address] [place], [place] [address]    Report Status 11/13/2018 FINAL  Final  MRSA PCR Screening     Status: None   Collection Time: 11/12/18  1:13 AM  Result Value Ref Range Status   MRSA by PCR NEGATIVE NEGATIVE Final    Comment:        The GeneXpert MRSA Assay (FDA approved for NASAL specimens only), is one component of a comprehensive MRSA colonization surveillance program. It is not intended to diagnose MRSA infection nor to guide or monitor treatment for MRSA infections. Performed at [facility], [place] [address] [place], [place] [address]   Respiratory Panel by PCR     Status: None   Collection Time: 11/12/18  2:46 AM  Result Value Ref Range Status   Adenovirus NOT DETECTED NOT DETECTED Final   Coronavirus 229E NOT DETECTED NOT DETECTED Final   Coronavirus HKU1 NOT DETECTED NOT DETECTED Final   Coronavirus NL63 NOT DETECTED NOT DETECTED Final   Coronavirus OC43 NOT DETECTED NOT DETECTED Final   Metapneumovirus NOT DETECTED NOT DETECTED Final   Rhinovirus / Enterovirus NOT DETECTED NOT DETECTED Final   Influenza A NOT DETECTED NOT DETECTED Final   Influenza B NOT DETECTED NOT DETECTED Final   Parainfluenza Virus 1 NOT DETECTED NOT DETECTED Final   Parainfluenza Virus 2 NOT DETECTED NOT DETECTED Final   Parainfluenza Virus 3 NOT DETECTED NOT DETECTED Final   Parainfluenza Virus 4 NOT DETECTED NOT DETECTED Final   Respiratory Syncytial Virus NOT DETECTED  NOT DETECTED Final   Bordetella pertussis NOT DETECTED NOT DETECTED Final   Chlamydophila pneumoniae NOT DETECTED NOT DETECTED  Final   Mycoplasma pneumoniae NOT DETECTED NOT DETECTED Final    Comment: Performed at [facility], [place] [address], [place], [place] [address]     Labs: BNP (last 3 results) No results for input(s): BNP in the last 8760 hours. Basic Metabolic Panel: Recent Labs  Lab 11/12/18 0354 11/13/18 0537 11/14/18 0410 11/15/18 0534 11/16/18 0433 11/17/18 0339  NA 144 148* 150* 149* 149* 147*  K 3.5 3.5 3.6 3.6 4.1 4.0  CL 114* 117* 118* 117* 118* 115*  CO2 19* 19* 22 22 19* 21*  GLUCOSE 141* 104* 113* 106* 104* 96  BUN 98* 87* 78* 67* 63* 65*  CREATININE 4.96* 3.71* 3.28* 2.99* 3.07* 3.26*  CALCIUM 8.4* 8.7* 8.4* 8.9 9.0 9.0  MG 2.4  --  2.7*  --   --   --   PHOS 4.3  --  4.1  --   --   --    Liver Function Tests: Recent Labs  Lab 11/11/18 1741 11/13/18 0537 11/14/18 0410  AST 31 19  --   ALT 37 31  --   ALKPHOS 155* 117  --   BILITOT 2.4* 1.0  --   PROT 7.4 5.8*  --   ALBUMIN 2.9* 2.1* 1.9*   No results for input(s): LIPASE, AMYLASE in the last 168 hours. No results for input(s): AMMONIA in the last 168 hours. CBC: Recent Labs  Lab 11/11/18 1741  11/12/18 0354 11/13/18 0537 11/14/18 0410 11/15/18 0534 11/16/18 0433  WBC 20.7*  --  21.7* 17.4* 14.9* 14.1* 13.8*  NEUTROABS 18.7*  --   --   --   --  11.8* 11.3*  HGB 11.4*   < > 9.2* 9.8* 9.0* 9.6* 9.4*  HCT 34.0*   < > 26.4* 28.3* 27.5* 28.1* 27.8*  MCV 84.2  --  82.8 82.7 84.1 83.9 84.5  PLT 269  --  230 244 183 202 181   < > = values in this interval not displayed.   Cardiac Enzymes: No results for input(s): CKTOTAL, CKMB, CKMBINDEX, TROPONINI in the last 168 hours. BNP: Invalid input(s): POCBNP CBG: Recent Labs  Lab 11/12/18 0044  GLUCAP 170*   D-Dimer No results for input(s): DDIMER in the last 72 hours. Hgb A1c No results for input(s): HGBA1C in the last 72  hours. Lipid Profile No results for input(s): CHOL, HDL, LDLCALC, TRIG, CHOLHDL, LDLDIRECT in the last 72 hours. Thyroid function studies No results for input(s): TSH, T4TOTAL, T3FREE, THYROIDAB in the last 72 hours.  Invalid input(s): FREET3 Anemia work up No results for input(s): VITAMINB12, FOLATE, FERRITIN, TIBC, IRON, RETICCTPCT in the last 72 hours. Urinalysis    Component Value Date/Time   COLORURINE YELLOW 11/11/2018 1808   APPEARANCEUR CLOUDY (A) 11/11/2018 1808   LABSPEC 1.010 11/11/2018 1808   PHURINE 5.5 11/11/2018 1808   GLUCOSEU NEGATIVE 11/11/2018 1808   HGBUR NEGATIVE 11/11/2018 1808   BILIRUBINUR NEGATIVE 11/11/2018 1808   KETONESUR 15 (A) 11/11/2018 1808   PROTEINUR NEGATIVE 11/11/2018 1808   NITRITE NEGATIVE 11/11/2018 1808   LEUKOCYTESUR NEGATIVE 11/11/2018 1808   Sepsis Labs Invalid input(s): PROCALCITONIN,  WBC,  LACTICIDVEN Microbiology Recent Results (from the past 240 hour(s))  Culture, blood (Routine x 2)     Status: None   Collection Time: 11/11/18  5:35 PM  Result Value Ref Range Status   Specimen Description BLOOD BLOOD RIGHT ARM  Final   Special Requests   Final    BOTTLES DRAWN AEROBIC AND ANAEROBIC Blood  Culture adequate volume Performed at [facility], [address] [place], [place] [address]    Culture NO GROWTH 5 DAYS  Final   Report Status 11/16/2018 FINAL  Final  Culture, blood (Routine x 2)     Status: Abnormal   Collection Time: 11/11/18  5:45 PM  Result Value Ref Range Status   Specimen Description   Final    BLOOD BLOOD LEFT ARM Performed at [facility], [address] [place], [place] [address]    Special Requests   Final    BOTTLES DRAWN AEROBIC AND ANAEROBIC Blood Culture adequate volume Performed at [facility], [address] [place], [place] [address]    Culture  Setup Time   Final    AEROBIC BOTTLE ONLY GRAM POSITIVE COCCI Organism ID to follow CRITICAL RESULT CALLED TO, READ  BACK BY AND VERIFIED WITH: J MILLEN PHARMD 11/12/18 1755 JDW    Culture (A)  Final    STAPHYLOCOCCUS SPECIES (COAGULASE NEGATIVE) THE SIGNIFICANCE OF ISOLATING THIS ORGANISM FROM A SINGLE SET OF BLOOD CULTURES WHEN MULTIPLE SETS ARE DRAWN IS UNCERTAIN. PLEASE NOTIFY THE MICROBIOLOGY DEPARTMENT WITHIN ONE WEEK IF SPECIATION AND SENSITIVITIES ARE REQUIRED. Performed at [facility], [place] [address], [place], [place] [address]    Report Status 11/14/2018 FINAL  Final  Blood Culture ID Panel (Reflexed)     Status: Abnormal   Collection Time: 11/11/18  5:45 PM  Result Value Ref Range Status   Enterococcus species NOT DETECTED NOT DETECTED Final   Listeria monocytogenes NOT DETECTED NOT DETECTED Final   Staphylococcus species DETECTED (A) NOT DETECTED Final    Comment: Methicillin (oxacillin) susceptible coagulase negative staphylococcus. Possible blood culture contaminant (unless isolated from more than one blood culture draw or clinical case suggests pathogenicity). No antibiotic treatment is indicated for blood  culture contaminants. CRITICAL RESULT CALLED TO, READ BACK BY AND VERIFIED WITH: J MILLEN PHARMD 11/12/18 1755 JDW    Staphylococcus aureus (BCID) NOT DETECTED NOT DETECTED Final   Methicillin resistance NOT DETECTED NOT DETECTED Final   Streptococcus species NOT DETECTED NOT DETECTED Final   Streptococcus agalactiae NOT DETECTED NOT DETECTED Final   Streptococcus pneumoniae NOT DETECTED NOT DETECTED Final   Streptococcus pyogenes NOT DETECTED NOT DETECTED Final   Acinetobacter baumannii NOT DETECTED NOT DETECTED Final   Enterobacteriaceae species NOT DETECTED NOT DETECTED Final   Enterobacter cloacae complex NOT DETECTED NOT DETECTED Final   Escherichia coli NOT DETECTED NOT DETECTED Final   Klebsiella oxytoca NOT DETECTED NOT DETECTED Final   Klebsiella pneumoniae NOT DETECTED NOT DETECTED Final   Proteus species NOT DETECTED NOT DETECTED Final   Serratia marcescens NOT  DETECTED NOT DETECTED Final   Haemophilus influenzae NOT DETECTED NOT DETECTED Final   Neisseria meningitidis NOT DETECTED NOT DETECTED Final   Pseudomonas aeruginosa NOT DETECTED NOT DETECTED Final   Candida albicans NOT DETECTED NOT DETECTED Final   Candida glabrata NOT DETECTED NOT DETECTED Final   Candida krusei NOT DETECTED NOT DETECTED Final   Candida parapsilosis NOT DETECTED NOT DETECTED Final   Candida tropicalis NOT DETECTED NOT DETECTED Final    Comment: Performed at [facility], [place], [place], [place] [address]  Urine culture     Status: None   Collection Time: 11/11/18  6:08 PM  Result Value Ref Range Status   Specimen Description   Final    URINE, RANDOM Performed at [facility],  [place], [place] [address]    Special Requests   Final    NONE Performed at [facility], [address] [place], [place] [address]    Culture   Final    NO GROWTH Performed at [facility], [place] [address], [place], [place] [address]    Report Status 11/13/2018 FINAL  Final  MRSA PCR Screening     Status: None   Collection Time: 11/12/18  1:13 AM  Result Value Ref Range Status   MRSA by PCR NEGATIVE NEGATIVE Final    Comment:        The GeneXpert MRSA Assay (FDA approved for NASAL specimens only), is one component of a comprehensive MRSA colonization surveillance program. It is not intended to diagnose MRSA infection nor to guide or monitor treatment for MRSA infections. Performed at [facility], [place] [address], [place], [place] [address]   Respiratory Panel by PCR     Status: None   Collection Time: 11/12/18  2:46 AM  Result Value Ref Range Status   Adenovirus NOT DETECTED NOT DETECTED Final   Coronavirus 229E NOT DETECTED NOT DETECTED Final   Coronavirus HKU1 NOT DETECTED NOT DETECTED Final   Coronavirus NL63 NOT DETECTED NOT DETECTED Final   Coronavirus OC43 NOT DETECTED NOT DETECTED Final    Metapneumovirus NOT DETECTED NOT DETECTED Final   Rhinovirus / Enterovirus NOT DETECTED NOT DETECTED Final   Influenza A NOT DETECTED NOT DETECTED Final   Influenza B NOT DETECTED NOT DETECTED Final   Parainfluenza Virus 1 NOT DETECTED NOT DETECTED Final   Parainfluenza Virus 2 NOT DETECTED NOT DETECTED Final   Parainfluenza Virus 3 NOT DETECTED NOT DETECTED Final   Parainfluenza Virus 4 NOT DETECTED NOT DETECTED Final   Respiratory Syncytial Virus NOT DETECTED NOT DETECTED Final   Bordetella pertussis NOT DETECTED NOT DETECTED Final   Chlamydophila pneumoniae NOT DETECTED NOT DETECTED Final   Mycoplasma pneumoniae NOT DETECTED NOT DETECTED Final    Comment: Performed at [facility], [place] [address] [place] [address], [place], [place] [address]     Time coordinating discharge: 45 minutes  SIGNED:   Tawni Millers, MD  [facility] 11/17/2018, 8:55 AM

## 2018-11-17 NOTE — Progress Notes (Addendum)
Advanced Heart Failure Rounding Note  PCP-Cardiologist: No primary care provider on file.   Subjective:     Further VT but certainly has PVCs.  Fatigue is improved.  He is currently feeling well.   Objective:   Weight Range: 73.3 kg Body mass index is 22.54 kg/m.   Vitals:   11/16/18 2203 11/17/18 0501  BP: 104/65 113/63  Pulse:  79  Resp:  19  Temp:  98.5 F (36.9 C)  SpO2:  96%   Weight change: Filed Weights   11/15/18 0434 11/16/18 0234 11/17/18 0501  Weight: 73.5 kg 73.4 kg 73.3 kg   Intake/Output:   Intake/Output Summary (Last 24 hours) at 11/17/2018 6294 Last data filed at 11/17/2018 7654 Gross per 24 hour  Intake 1080 ml  Output -  Net 1080 ml    Physical Exam    GEN: Well nourished, well developed, in no acute distress  HEENT: normal  Neck: no JVD, carotid bruits, or masses Cardiac: RRR; no murmurs, rubs, or gallops,no edema  Respiratory:  clear to auscultation bilaterally, normal work of breathing GI: soft, nontender, nondistended, + BS MS: no deformity or atrophy  Skin: warm and dry, device site well healed Neuro:  Strength and sensation are intact Psych: euthymic mood, full affect    Telemetry   Sinus rhythm with PVCs-personally reviewed  EKG    None new  Labs    CBC Recent Labs    11/15/18 0534 11/16/18 0433  WBC 14.1* 13.8*  NEUTROABS 11.8* 11.3*  HGB 9.6* 9.4*  HCT 28.1* 27.8*  MCV 83.9 84.5  PLT 202 650   Basic Metabolic Panel Recent Labs    11/16/18 0433 11/17/18 0339  NA 149* 147*  K 4.1 4.0  CL 118* 115*  CO2 19* 21*  GLUCOSE 104* 96  BUN 63* 65*  CREATININE 3.07* 3.26*  CALCIUM 9.0 9.0   Liver Function Tests No results for input(s): AST, ALT, ALKPHOS, BILITOT, PROT, ALBUMIN in the last 72 hours. No results for input(s): LIPASE, AMYLASE in the last 72 hours. Cardiac Enzymes No results for input(s): CKTOTAL, CKMB, CKMBINDEX, TROPONINI in the last 72 hours.  BNP: BNP (last 3 results) No results for  input(s): BNP in the last 8760 hours.  ProBNP (last 3 results) No results for input(s): PROBNP in the last 8760 hours.  D-Dimer No results for input(s): DDIMER in the last 72 hours. Hemoglobin A1C No results for input(s): HGBA1C in the last 72 hours. Fasting Lipid Panel No results for input(s): CHOL, HDL, LDLCALC, TRIG, CHOLHDL, LDLDIRECT in the last 72 hours. Thyroid Function Tests No results for input(s): TSH, T4TOTAL, T3FREE, THYROIDAB in the last 72 hours.  Invalid input(s): FREET3  Other results:  Imaging   No results found.  Medications:     Scheduled Medications: . allopurinol  150 mg Oral Daily  . amiodarone  200 mg Oral BID  . carvedilol  6.25 mg Oral BID WC  . feeding supplement (ENSURE ENLIVE)  237 mL Oral BID BM  . furosemide  40 mg Oral QPM  . furosemide  80 mg Oral q morning - 10a  . heparin  5,000 Units Subcutaneous Q8H  . hydrALAZINE  12.5 mg Oral Q8H  . isosorbide mononitrate  30 mg Oral Daily  . sodium chloride flush  3 mL Intravenous Once    Infusions: . sodium chloride 10 mL/hr at 11/13/18 1300    PRN Medications:    Patient Profile   Miguel Roach is a  78 y.o. male with chronic systolic CHF, NICM, h/o VT with appropriate shock, PAF (short 10 minute episode noted on ICD), OSA, Gout, and CKD IV.   Presented to [facility] 11/12/2018 with fever and hypotension in setting of respiratory infection. Admitted for sepsis. CHF team consulted with EF <30% and ? Cardiogenic component.   Assessment/Plan   1. Shock, Septic +/- cardiogenic component - Blood cultures are positive for 1 out of 4 MSSA.  Potentially a contaminant.  Currently afebrile.  Has completed antibiotics.  2. Acute on chronic systolic CHF, NICM CO preserved on RHC in 6/16, but 10/16 CPX showed moderate to severe functional impairment (seems worse than his symptoms would suggest). However, repeat CPX in 11/17, though submaximal, suggested no more than mild HF limitation. No significant  CAD with coronary angiography in 6/16. No hx heavy ETOH or drug abuse. No marked HTN. Negative SPEP/UPEP. Possible prior myocarditis. ?familial cardiomyopathy =>mother had "weak heart" of uncertain etiology. He now has [employer] ICD. Echo in 6/19 showed EF 25-30% (stable) with mild-moderate AI and mildly decreased RV systolic function. Frequent PVCs.  Volume status is remained stable and thus would hold on diuresis.  Continue Coreg, hydralazine, digoxin, Imdur.  Has stage IV CKD and thus avoiding ACE inhibitor/ARB use, Aldactone  3. ARF on CKD IV Creatinine is back to baseline, around 3.  Not undergoing diuresis at this time.  4. OSA:  CCM following  5. H/o VT: Appropriate ICD discharge in 3/18.  As given magnesium of rain stable.  He is currently on amiodarone.  At this point he has had minimal VT over the last day.  Would thus feel okay with discharge.  Would discharge on amiodarone 200 mg twice a day.  We Miguel Roach arrange for follow-up later this week in heart failure clinic.  -6. Atrial fibrillation: Not anticoagulated due to a low burden.  Would plan for anticoagulation should burden increase on device interrogation.  Has follow up in HF clinic 2/6.  Length of Stay: 6  Miguel Bonenfant Meredith Leeds, MD  11/17/2018, 8:22 AM

## 2018-11-17 NOTE — Progress Notes (Signed)
Reviewed all d/c instructions with patient and wife, verbalized understanding. Copy of instructions given.  IV d/ced without problem.  Patient to be d/ced via [facility] with wife.

## 2018-11-18 ENCOUNTER — Telehealth: Payer: Self-pay | Admitting: *Deleted

## 2018-11-18 ENCOUNTER — Telehealth (HOSPITAL_COMMUNITY): Payer: Self-pay

## 2018-11-18 ENCOUNTER — Ambulatory Visit (INDEPENDENT_AMBULATORY_CARE_PROVIDER_SITE_OTHER): Payer: Medicare Other | Admitting: *Deleted

## 2018-11-18 DIAGNOSIS — I472 Ventricular tachycardia, unspecified: Secondary | ICD-10-CM

## 2018-11-18 DIAGNOSIS — Z95810 Presence of automatic (implantable) cardiac defibrillator: Secondary | ICD-10-CM

## 2018-11-18 NOTE — Telephone Encounter (Signed)
Called pt and requested that he send a manual transmission w/ his home monitor. Attempted to help pt send the transmission. Error code 8270. Informed pt and his wife that I would call back at 8:30. Pt and wife verbalized understanding.

## 2018-11-18 NOTE — Telephone Encounter (Signed)
Spoke w/ pt and attempted to help them send manual transmission w/ home monitor. Error code 6503. Instructed pt and his wife to call tech support.

## 2018-11-18 NOTE — Telephone Encounter (Signed)
Spoke with patient and wife. Patient does not think he received a shock, but did hear the alert tone this AM. Patient's wife was concerned due to the alert tone and called the on-call number. Patient and wife agree to a DC appointment this afternoon at 1:00pm as they cannot get his home monitor to work. They deny additional questions or concerns at this time.

## 2018-11-18 NOTE — Telephone Encounter (Signed)
Patient wife called back and stated that they are waiting for a new monitor. Informed her that we will call back with a plan from this point. Pt wife verbalized understanding.

## 2018-11-18 NOTE — Telephone Encounter (Signed)
Pt insurance is active and benefits verified through [facility] Medicare. Co-pay $20.00, DED $0.00/$0.00 met, out of pocket $4,000.00/$38.78 met, co-insurance 0%. No pre-authorization required. Passport, 11/18/2018 @ 9:24AM, REF# [id number]  Will contact patient to see if he is interested in the Cardiac Rehab Program. If interested, patient will need to complete follow up appt. Once completed, patient will be contacted for scheduling upon review by the RN Navigator.

## 2018-11-18 NOTE — Telephone Encounter (Signed)
Called patient to see if he is interested in the Cardiac Rehab Program. Patient expressed interest. Explained scheduling process and went over insurance, patient verbalized understanding. Will contact patient for scheduling once f/u has been completed.  °

## 2018-11-18 NOTE — Telephone Encounter (Signed)
Per Daune Perch, NP, patient called the on-call number as he received an ICD shock this morning around 07:30.  Will call patient to request a transmission for review.

## 2018-11-20 LAB — CUP PACEART INCLINIC DEVICE CHECK
Battery Remaining Longevity: 117 mo
Battery Voltage: 3.01 V
Brady Statistic RV Percent Paced: 0.12 %
Date Time Interrogation Session: 20200203194916
HighPow Impedance: 63 Ohm
Implantable Lead Implant Date: 20170208
Implantable Lead Location: 753860
Implantable Lead Model: 6935
Implantable Pulse Generator Implant Date: 20170208
Lead Channel Impedance Value: 304 Ohm
Lead Channel Impedance Value: 361 Ohm
Lead Channel Pacing Threshold Amplitude: 1 V
Lead Channel Pacing Threshold Pulse Width: 0.4 ms
Lead Channel Sensing Intrinsic Amplitude: 10.375 mV
Lead Channel Setting Pacing Pulse Width: 0.4 ms
Lead Channel Setting Sensing Sensitivity: 0.3 mV
MDC IDC SET LEADCHNL RV PACING AMPLITUDE: 2.25 V

## 2018-11-20 NOTE — Progress Notes (Signed)
ICD check in clinic, added-on for alert tone on 11/18/18. Normal device function. Alert tone sounded due to unsuccessful Carelink Alert transmission for "AF" burden, patient admitted at [facility] 1/27-2/2 to r/o sepsis. Thresholds and sensing consistent with previous device measurements. Impedance trends stable over time. 4 NSVT episodes. 149 AF episodes (0.8% burden), frequent PVCs and PAF noted during admission per notes, d/c on amiodarone. Histogram distribution appropriate for patient and level of activity. No changes made this session. Device programmed at appropriate safety margins. Device programmed to optimize intrinsic conduction. Estimated longevity 9.7 years. Patient enrolled in remote follow-up, new Carelink monitor will be shipped by [employer] per patient. Patient education completed including shock plan. Alert tones demonstrated for patient. Carelink on 11/25/18 and ROV with GT in 12/2018.

## 2018-11-21 ENCOUNTER — Ambulatory Visit (HOSPITAL_COMMUNITY)
Admission: RE | Admit: 2018-11-21 | Discharge: 2018-11-21 | Disposition: A | Discharge status: Home / Self Care | Payer: Medicare Other | Source: Ambulatory Visit | Attending: Internal Medicine | Admitting: Internal Medicine

## 2018-11-21 VITALS — BP 102/56 | HR 70 | Wt 168.2 lb

## 2018-11-21 DIAGNOSIS — G4733 Obstructive sleep apnea (adult) (pediatric): Secondary | ICD-10-CM | POA: Insufficient documentation

## 2018-11-21 DIAGNOSIS — I480 Paroxysmal atrial fibrillation: Secondary | ICD-10-CM | POA: Insufficient documentation

## 2018-11-21 DIAGNOSIS — I5022 Chronic systolic (congestive) heart failure: Secondary | ICD-10-CM | POA: Diagnosis present

## 2018-11-21 DIAGNOSIS — Z79899 Other long term (current) drug therapy: Secondary | ICD-10-CM | POA: Diagnosis not present

## 2018-11-21 DIAGNOSIS — I428 Other cardiomyopathies: Secondary | ICD-10-CM | POA: Insufficient documentation

## 2018-11-21 DIAGNOSIS — N184 Chronic kidney disease, stage 4 (severe): Secondary | ICD-10-CM | POA: Diagnosis not present

## 2018-11-21 DIAGNOSIS — N179 Acute kidney failure, unspecified: Secondary | ICD-10-CM | POA: Insufficient documentation

## 2018-11-21 DIAGNOSIS — I2510 Atherosclerotic heart disease of native coronary artery without angina pectoris: Secondary | ICD-10-CM | POA: Insufficient documentation

## 2018-11-21 DIAGNOSIS — Z95810 Presence of automatic (implantable) cardiac defibrillator: Secondary | ICD-10-CM

## 2018-11-21 DIAGNOSIS — I472 Ventricular tachycardia: Secondary | ICD-10-CM | POA: Insufficient documentation

## 2018-11-21 DIAGNOSIS — J984 Other disorders of lung: Secondary | ICD-10-CM

## 2018-11-21 DIAGNOSIS — Z8249 Family history of ischemic heart disease and other diseases of the circulatory system: Secondary | ICD-10-CM | POA: Insufficient documentation

## 2018-11-21 DIAGNOSIS — M109 Gout, unspecified: Secondary | ICD-10-CM | POA: Diagnosis not present

## 2018-11-21 DIAGNOSIS — I493 Ventricular premature depolarization: Secondary | ICD-10-CM

## 2018-11-21 NOTE — Patient Instructions (Signed)
Follow up in 3 weeks.  Continue Amiodarone twice daily for 7 days and then decrease Amiodarone to 1 tablet daily.

## 2018-11-21 NOTE — Progress Notes (Signed)
Advanced Heart Failure Clinic Note   Patient ID: Miguel Roach, male   DOB: 10-27-1940, 78 y.o.   MRN: [id number] PCP: Dr. Shelia Media Cardiology: Dr. Ronda Fairly is a 78 y.o. male with history of chronic systolic CHF/nonischemic cardiomyopathy and CKD presents for followup of CHF. He actually had an echo back in 2010 with EF 40-45% at the time.  He says that this really was not followed up upon by a cardiologist and that he did well for a number of years after that. In 6/16, he was admitted with acute systolic CHF.  Echo showed EF down to 20-25%.  Cardiac cath showed nonobstructive CAD.  His Lasix has been gradually increased.  This has brought his weight down and improved his breathing, but creatinine has risen. He had been on olmesartan but this was stopped because he thought it was causing cough.  He was then put on losartan, but this was stopped with rise in creatinine. Also intolerant to Bidil with orthostatic symptoms.  He was started on digoxin.  Echo in 12/16 showed persistently low EF, 15-20%.  Medtronic ICD was placed in 2/17.  Repeat echo 1/18 showed EF 25% with diffuse hypokinesis.   In 3/18, he had an episode of about 10 minutes atrial fibrillation noted on his ICD.  He also had 1 episode of VT terminated by an appropriate discharge.  He had push-mowed his grass and was very fatigued when he had the shock.  Since then, he has had no further arrhythmias.   Echo in 6/19 showed EF 25-30% with mild LV dilation and mild-moderate AI, normal RV size with mildly decreased systolic function, IVC normal.   Admitted 1/27 - 11/17/2018 with hypotension and found to have ? UTI sepsis. Treated on ABX and IVF. Eventually transitioned back to po diuretics with HF team following. 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 Noted to have increased NSVT and PVCs, started on amiodarone.   He presents today for post hospital follow up. He is slowly improving. He remains fatigue. No SOB getting around the house.  Not started exercising back yet, but getting around [facility]. Plans on starting Cardiac Rehab next month. He denies orthopnea or PND. Taking all medications as directed. He denies CP or palpitations. Weight stable at home. UOP steady, and remains clear/yellow. No fevers or chill.   Labs (6/16): BNP 1954, TSH normal Labs (7/16): K 4.1, creatinine 1.72 Labs (8/16): creatinine 2.3 Labs (9/16): digoxin 0.3, K 4, creatinine 2.14, SPEP negative, UPEP negative Labs (10/16): K 4.6, creatinine 1.62 Labs (11/16): K 4.4, creatinine 2.33, digoxin 0.5 Labs (12/16): creatinine 2.1 (nephrology) Labs (1/17): digoxin 0.6, uric acid 10.4 Labs (2/17): K 4, creatinine 2.13, hgb 11.6 Labs (5/17): K 3.8, creatinine 2.22, digoxin 0.7 Labs (9/17): K 4.4, creatinine 2.88, LFTs normal, digoxin 1.0, hgb 11.8, LDL 88, HDL 50 Labs (1/18): digoxin 0.5 Labs (3/18): K 4.4, creatinine 3.17 Labs (4/18): K 4.3, creatinine 3.39, digoxin 0.6 Labs (10/18): K 4.5, creatinine 3.65, digoxin 0.4 Labs (11/18): K 4, creatinine 3.24, digoxin 0.6 Labs (12/18): digoxin < 0.2 Labs (1/19): K 3.8, creatinine 3.15 Labs (2/19): K 4.0, creatinine 3.23 Labs (3/19): digoxin 0.4, K 3.8, creatinine 3.19 Labs (6/19): digoxin 0.3, K 3.9, creatinine 3.12 Labs (9/19): K 3.9, creatinine 3.58, digoxin 0.3, LDL 91  PMH: 1. CKD: Stage IV.  Follows with Dr Posey Pronto.  2. Gout 3. Chronic systolic CHF: Nonischemic cardiomyopathy.  EF 40-45% in 2010.  Echo (6/16) with EF 20-25%, severe LV dilation, mild  MR, mild AI, moderate LAE.  LHC/RHC (6/16) with nonobstructive CAD; mean RA 2, RV 48/1, PA 47/18, mean PCWP 12, CI 3.2.  CPX (10/16) with peak VO2 13.1 (50% predicted), VE/VCO2 slope 39.3, RER 1.22 => moderate to severe functional limitation.  Echo (12/16) with EF 15-20%, severe LV dilation, diffuse hypokinesis, moderate AI, mild MR, normal RV size and systolic function. Medtronic ICD.  - CPX (11/17): Submaximal with RER 0.95, VE/VCO2 slope 27, peak VO2 18.2.   Suspect no more than mild HF limitation.  - Echo (1/18): EF 25%, diffuse hypokinesis, normal RV size and systolic function, mild AI.  - Echo (6/19): EF 25-30% with mild LV dilation and mild-moderate AI, normal RV size with mildly decreased systolic function, IVC normal.  4. ACEI cough.  5. Pleural effusion: Thoracentesis on right in 8/16.  It was a transudate, likely due to CHF.  6. OSA: To start Bipap.  7. Esophageal duplication cyst.  8. High resolution CT chest 12/17: No evidence for interstitial lung disease.  9. Atrial fibrillation: Paroxysmal, 1 10 minute episode in 3/18.  10. VT: ICD discharge 3/18.   SH: Married, retired Pharmacist, hospital, never smoked, no ETOH.   FH: Mother with "weak heart." Brother with sickle cell anemia.   Review of systems complete and found to be negative unless listed in HPI.    Current Outpatient Medications  Medication Sig Dispense Refill  . allopurinol (ZYLOPRIM) 100 MG tablet TAKE 2 TABLETS (200 MG TOTAL) BY MOUTH DAILY. (Patient taking differently: Take 150 mg by mouth every evening. ) 180 tablet 3  . amiodarone (PACERONE) 200 MG tablet Take 1 tablet (200 mg total) by mouth 2 (two) times daily for 30 days. 60 tablet 0  . carvedilol (COREG) 6.25 MG tablet Take 1 tablet (6.25 mg total) by mouth 2 (two) times daily with a meal for 30 days. 60 tablet 0  . Cholecalciferol (VITAMIN D3) 5000 units TABS Take 5,000 Units by mouth every morning.     . colchicine 0.6 MG tablet Take 0.6 mg by mouth daily as needed (gout flare up).    . feeding supplement, ENSURE ENLIVE, (ENSURE ENLIVE) LIQD Take 237 mLs by mouth 2 (two) times daily between meals for 30 days. 14220 mL 0  . ferric citrate (AURYXIA) 1 GM 210 MG(Fe) tablet Take 210 mg by mouth daily.    . furosemide (LASIX) 40 MG tablet Take 2 tabs (80 mg total) by mouth every morning and 1 tab (40 mg total) every evening alternating with 2 tabs (80 mg total) twice a day. (Patient taking differently: Take 40-80 mg by mouth See  admin instructions. Take 2 tabs (80 mg total) by mouth daily at 2PM, and 1 tab (40 mg total) every evening at 6:30pm ALTERNATE WITH 2 TABS BY MOUTH TWICE DAILY ONE DAY AND 2 TABLETS AT 2PM, 1 TABLET AT 6:30.) 315 tablet 3  . hydrALAZINE (APRESOLINE) 25 MG tablet Take 0.5 tablets (12.5 mg total) by mouth every 8 (eight) hours for 30 days. 45 tablet 0  . isosorbide mononitrate (IMDUR) 30 MG 24 hr tablet TAKE 1 TABLET (30 MG TOTAL) BY MOUTH DAILY. (Patient taking differently: Take 30 mg by mouth at bedtime. ) 30 tablet 6   No current facility-administered medications for this encounter.    Vitals:   11/21/18 1112  BP: (!) 102/56  Pulse: 70  SpO2: 95%  Weight: 76.3 kg (168 lb 3.2 oz)   Wt Readings from Last 3 Encounters:  11/21/18 76.3 kg (168 lb  3.2 oz)  11/17/18 73.3 kg (161 lb 9.6 oz)  10/14/18 80.6 kg (177 lb 12.8 oz)    Physical Exam General: NAD HEENT: Normal Neck: Supple. JVP 6-7 cm. Carotids 2+ bilat; no bruits. No thyromegaly or nodule noted. Cor: PMI nondisplaced. RRR, No M/G/R noted Lungs: CTAB, normal effort. Abdomen: Soft, non-tender, non-distended, no HSM. No bruits or masses. +BS  Extremities: No cyanosis, clubbing, or rash. R and LLE no edema.  Neuro: Alert & orientedx3, cranial nerves grossly intact. moves all 4 extremities w/o difficulty. Affect pleasant   EKG NSR 71 bpm, no PVCs, personally reviewed.   Assessment/Plan:  1. Chronic chronic systolic CHF, NICM CO preserved on RHC in 6/16, but 10/16 CPX showed moderate to severe functional impairment (seems worse than his symptoms would suggest). However, repeat CPX in 11/17, though submaximal, suggested no more than mild HF limitation. No significant CAD with coronary angiography in 6/16. No hx heavy ETOH or drug abuse. No marked HTN. Negative SPEP/UPEP. Possible prior myocarditis. ?familial cardiomyopathy =>mother had "weak heart" of uncertain etiology. He now has Medtronic ICD. Echo in 6/19 showed EF 25-30%  (stable) with mild-moderate AI and mildly decreased RV systolic function. NYHA III symptoms or so after getting out of recent admission.  - Volume status stable on exam.  - Continue lasix 80 mg q am, with alternating evening doses of 80 and 40 mg.  - Continue coreg 3.125 mg BID - Hold dig with elevated creatinine for now.   - Continue hydralazine 12.5 mg TID for now.  - Continue imdur 30 mg daily.  - No ACE/ARB/ARNI or spiro with CKD IV. - Renal function ok.  2. ARF on CKD IV - Cr up to 5.5 on admission. - Discharge Cr 3.2. Had labs at PCP yesterday. Will request.  3. OSA: - BiPAP at home.  4.H/oVT: Appropriate ICD discharge in 3/18.  - Had frequent NSVT and PVCs during recent admission - Continue coreg 3.125 mg BID - Continue amiodarone 200 mg BID x 1 week, and then decrease to 200 mg daily.  5. Atrial fibrillation: Paroxysmal. Very short episodespreviously notedby device interrogation. He has had no prolonged or symptomatic atrial fibrillation (data for anticoagulating short runs of atrial fibrillation is not strong), so will hold off on anticoagulation.  - If he develops prolonged or symptomatic atrial fibrillation, would anticoagulate. - He is currently on amiodarone due to frequent NSVT/PVCs.  - Follow closely with sepsis. 6. Recent shock - Resp panel negative. Completed ABX course. Stable.   He is doing relatively well after recent admission. Had labs at PCP yesterday. Will obtain. Decrease amiodarone in one week. RTC 3 weeks. Sooner with symptoms.   Shirley Friar, PA-C  11/21/2018  Greater than 50% of the 25 minute visit was spent in counseling/coordination of care regarding disease state education, salt/fluid restriction, sliding scale diuretics, and medication compliance.

## 2018-11-25 ENCOUNTER — Ambulatory Visit (INDEPENDENT_AMBULATORY_CARE_PROVIDER_SITE_OTHER): Payer: Medicare Other

## 2018-11-25 ENCOUNTER — Telehealth (HOSPITAL_COMMUNITY): Payer: Self-pay

## 2018-11-25 DIAGNOSIS — I5022 Chronic systolic (congestive) heart failure: Secondary | ICD-10-CM | POA: Diagnosis not present

## 2018-11-25 DIAGNOSIS — I428 Other cardiomyopathies: Secondary | ICD-10-CM

## 2018-11-25 NOTE — Telephone Encounter (Signed)
Pt called to report he is taking amiodarone twice daily and he is feeling bad and wanted to know if he can decrease to once daily.  I advised pt and pt's wife to decrease it to once daily and Dr can reassess it on his next appt 02/27.

## 2018-11-26 LAB — CUP PACEART REMOTE DEVICE CHECK
Battery Remaining Longevity: 117 mo
Battery Voltage: 3.01 V
Brady Statistic RV Percent Paced: 0.01 %
Date Time Interrogation Session: 20200210083421
HighPow Impedance: 64 Ohm
Implantable Lead Implant Date: 20170208
Implantable Lead Location: 753860
Implantable Lead Model: 6935
Implantable Pulse Generator Implant Date: 20170208
Lead Channel Impedance Value: 304 Ohm
Lead Channel Impedance Value: 361 Ohm
Lead Channel Pacing Threshold Amplitude: 1 V
Lead Channel Pacing Threshold Pulse Width: 0.4 ms
Lead Channel Sensing Intrinsic Amplitude: 6.875 mV
Lead Channel Sensing Intrinsic Amplitude: 6.875 mV
Lead Channel Setting Pacing Amplitude: 2 V
Lead Channel Setting Pacing Pulse Width: 0.4 ms
Lead Channel Setting Sensing Sensitivity: 0.3 mV

## 2018-11-28 NOTE — Telephone Encounter (Signed)
Attempted to call patient in regards to Cardiac Rehab - LM on VM °Gloria W. Support Rep II °

## 2018-12-02 ENCOUNTER — Ambulatory Visit (INDEPENDENT_AMBULATORY_CARE_PROVIDER_SITE_OTHER): Payer: Medicare Other

## 2018-12-02 DIAGNOSIS — Z95810 Presence of automatic (implantable) cardiac defibrillator: Secondary | ICD-10-CM

## 2018-12-02 DIAGNOSIS — I5022 Chronic systolic (congestive) heart failure: Secondary | ICD-10-CM

## 2018-12-02 NOTE — Progress Notes (Signed)
EPIC Encounter for ICM Monitoring  Patient Name: Miguel Roach is a 78 y.o. male Date: 12/02/2018 Primary Care Physican: Deland Pretty, MD Primary [place] Electrophysiologist: Lovena Le Nephrologist: [place] Last Weight:160lbs Today's Weight: 158.8 lbs        Heart Failure questions reviewed.  Pt asymptomatic. Denies any fluid symptoms.  He has been walking everyday for exercise and strength. Hospitalized 11/11/2018 - 11/17/2018 to r/o sepsis.     Report: Thoracic impedance abnormal suggesting fluid accumulation starting 11/11/2018 which correlates with hospitalization.  Impedance remains decreased.    Prescribed: Furosemide 40 mg take 2 tablets (80 mg total) bymouth every morningand 1 tablet (40 mg total)everyeveningalternating every other day with 80 mg twice a day.  Labs: 11/17/2018 Creatinine 3.26, BUN 65, Potassium 4.0, Sodium 147, GFR 17-20 11/16/2018 Creatinine 3.07, BUN 63, Potassium 4.1, Sodium 149, GFR 19-22  11/15/2018 Creatinine 2.99, BUN 67, Potassium 3.6, Sodium 149, GFR 19-22  11/14/2018 Creatinine 3.28, BUN 78, Potassium 3.6, Sodium 150, GFR 17-20  11/13/2018 Creatinine 3.71, BUN 87, Potassium 3.5, Sodium 148, GFR 15-17  11/12/2018 Creatinine 4.96, BUN 98, Potassium 3.5, Sodium 144, GFR 10-12  11/11/2018 Creatinine 5.55, BUN 112, Potassium 3.6, Sodium 142, GFR 9-11  10/14/2018 Creatinine 3.05, BUN 41, Potassium 3.5, Sodium 142, GFR 19-22  07/05/2018 Creatinine 3.58, BUN 58, Potassium 3.9, Sodium 142, eGFR 15-18 04/01/2018 Creatinine 3.12, BUN 42, Potassium 3.9, Sodium 144, EGFR 18-21 01/02/2018 Creatinine 3.19, BUN 42, Potassium 3.8, Sodium 140, EGFR 17-20 12/03/2017 Creatinine 3.23, BUN 44, Potassium 4.0, Sodium 145, EGFR 18-20 11/12/2017 Creatinine 3.15, BUN 44, Potassium 3.8, Sodium 141, EGFR 18-21 A complete set of results can be found in Results Review.  Recommendations:  Advised if any recommendations by Dr Aundra Dubin will call  back.  Encouraged to call for fluid symptoms.  Follow-up plan: ICM clinic phone appointment on 12/18/2018 to recheck fluid levels.   Office appt 12/12/2018 with HF clinic PA/NP.    Copy of ICM check sent to Dr. Lovena Le and Dr Aundra Dubin for review and recommendations if needed.   3 month ICM trend: 12/02/2018    1 Year ICM trend:       Rosalene Billings, RN 12/02/2018 12:16 PM

## 2018-12-06 MED ORDER — FUROSEMIDE 40 MG PO TABS: ORAL_TABLET | ORAL | 3 refills | Status: DC

## 2018-12-06 NOTE — Progress Notes (Signed)
Remote ICD transmission.   

## 2018-12-06 NOTE — Progress Notes (Signed)
Increase Lasix for now to 80 mg bid and get BMET next week.

## 2018-12-06 NOTE — Progress Notes (Signed)
Spoke with patient and advised to increase Lasix for now to 80 mg bid and get BMET next week.  He has an appt with HF clinic on 2/27 and advised he can have BMET done that day.  He verbalized understanding and repeated back Lasix instructions correctly.  He has enough supply on hand to increase the dosage and does not need a refill at this time.

## 2018-12-12 ENCOUNTER — Ambulatory Visit (HOSPITAL_COMMUNITY)
Admission: RE | Admit: 2018-12-12 | Discharge: 2018-12-12 | Disposition: A | Discharge status: Home / Self Care | Payer: Medicare Other | Source: Ambulatory Visit | Attending: Cardiology | Admitting: Cardiology

## 2018-12-12 ENCOUNTER — Other Ambulatory Visit: Payer: Self-pay

## 2018-12-12 ENCOUNTER — Encounter (HOSPITAL_COMMUNITY): Payer: Self-pay

## 2018-12-12 ENCOUNTER — Telehealth (HOSPITAL_COMMUNITY): Payer: Self-pay | Admitting: Cardiology

## 2018-12-12 VITALS — BP 124/70 | HR 71 | Wt 162.2 lb

## 2018-12-12 DIAGNOSIS — Z8249 Family history of ischemic heart disease and other diseases of the circulatory system: Secondary | ICD-10-CM | POA: Insufficient documentation

## 2018-12-12 DIAGNOSIS — I428 Other cardiomyopathies: Secondary | ICD-10-CM | POA: Insufficient documentation

## 2018-12-12 DIAGNOSIS — M109 Gout, unspecified: Secondary | ICD-10-CM | POA: Diagnosis not present

## 2018-12-12 DIAGNOSIS — I493 Ventricular premature depolarization: Secondary | ICD-10-CM

## 2018-12-12 DIAGNOSIS — I2510 Atherosclerotic heart disease of native coronary artery without angina pectoris: Secondary | ICD-10-CM | POA: Diagnosis not present

## 2018-12-12 DIAGNOSIS — Z79899 Other long term (current) drug therapy: Secondary | ICD-10-CM | POA: Diagnosis not present

## 2018-12-12 DIAGNOSIS — I472 Ventricular tachycardia: Secondary | ICD-10-CM | POA: Diagnosis not present

## 2018-12-12 DIAGNOSIS — G4733 Obstructive sleep apnea (adult) (pediatric): Secondary | ICD-10-CM | POA: Insufficient documentation

## 2018-12-12 DIAGNOSIS — N184 Chronic kidney disease, stage 4 (severe): Secondary | ICD-10-CM | POA: Insufficient documentation

## 2018-12-12 DIAGNOSIS — N179 Acute kidney failure, unspecified: Secondary | ICD-10-CM | POA: Diagnosis not present

## 2018-12-12 DIAGNOSIS — I5022 Chronic systolic (congestive) heart failure: Secondary | ICD-10-CM | POA: Diagnosis not present

## 2018-12-12 DIAGNOSIS — I420 Dilated cardiomyopathy: Secondary | ICD-10-CM | POA: Diagnosis not present

## 2018-12-12 DIAGNOSIS — Z95810 Presence of automatic (implantable) cardiac defibrillator: Secondary | ICD-10-CM | POA: Diagnosis not present

## 2018-12-12 DIAGNOSIS — I480 Paroxysmal atrial fibrillation: Secondary | ICD-10-CM | POA: Insufficient documentation

## 2018-12-12 LAB — COMPREHENSIVE METABOLIC PANEL
ALBUMIN: 2.5 g/dL — AB (ref 3.5–5.0)
ALT: 15 U/L (ref 0–44)
ANION GAP: 10 (ref 5–15)
AST: 14 U/L — ABNORMAL LOW (ref 15–41)
Alkaline Phosphatase: 76 U/L (ref 38–126)
BUN: 60 mg/dL — ABNORMAL HIGH (ref 8–23)
CO2: 25 mmol/L (ref 22–32)
Calcium: 8.9 mg/dL (ref 8.9–10.3)
Chloride: 104 mmol/L (ref 98–111)
Creatinine, Ser: 4.16 mg/dL — ABNORMAL HIGH (ref 0.61–1.24)
GFR calc Af Amer: 15 mL/min — ABNORMAL LOW (ref >60)
GFR calc non Af Amer: 13 mL/min — ABNORMAL LOW (ref >60)
GLUCOSE: 106 mg/dL — AB (ref 70–99)
Potassium: 4.2 mmol/L (ref 3.5–5.1)
SODIUM: 139 mmol/L (ref 135–145)
Total Bilirubin: 0.5 mg/dL (ref 0.3–1.2)
Total Protein: 7 g/dL (ref 6.5–8.1)

## 2018-12-12 LAB — TSH: TSH: 2.657 u[IU]/mL (ref 0.350–4.500)

## 2018-12-12 LAB — T4, FREE: Free T4: 1.17 ng/dL (ref 0.82–1.77)

## 2018-12-12 MED ORDER — FUROSEMIDE 40 MG PO TABS: ORAL_TABLET | ORAL | 3 refills | Status: DC

## 2018-12-12 MED ORDER — AMIODARONE HCL 100 MG PO TABS: 100.0000 mg | ORAL_TABLET | Freq: Every day | ORAL | 6 refills | Status: DC

## 2018-12-12 NOTE — Progress Notes (Signed)
Advanced Heart Failure Clinic Note   Patient ID: Miguel Roach, male   DOB: 03-11-1941, 78 y.o.   MRN: [id number] PCP: Dr. Shelia Media Cardiology: Dr. Ronda Fairly is a 78 y.o. male with history of chronic systolic CHF/nonischemic cardiomyopathy and CKD presents for followup of CHF. He actually had an echo back in 2010 with EF 40-45% at the time.  He says that this really was not followed up upon by a cardiologist and that he did well for a number of years after that. In 6/16, he was admitted with acute systolic CHF.  Echo showed EF down to 20-25%.  Cardiac cath showed nonobstructive CAD.  His Lasix has been gradually increased.  This has brought his weight down and improved his breathing, but creatinine has risen. He had been on olmesartan but this was stopped because he thought it was causing cough.  He was then put on losartan, but this was stopped with rise in creatinine. Also intolerant to Bidil with orthostatic symptoms.  He was started on digoxin.  Echo in 12/16 showed persistently low EF, 15-20%.  Medtronic ICD was placed in 2/17.  Repeat echo 1/18 showed EF 25% with diffuse hypokinesis.   In 3/18, he had an episode of about 10 minutes atrial fibrillation noted on his ICD.  He also had 1 episode of VT terminated by an appropriate discharge.  He had push-mowed his grass and was very fatigued when he had the shock.  Since then, he has had no further arrhythmias.   Echo in 6/19 showed EF 25-30% with mild LV dilation and mild-moderate AI, normal RV size with mildly decreased systolic function, IVC normal.   Admitted 1/27 - 11/17/2018 with hypotension and found to have ? UTI sepsis. Treated on ABX and IVF. Eventually transitioned back to po diuretics with HF team following. 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 Noted to have increased NSVT and PVCs, started on amiodarone.   He presents today for regular follow up. Last week torsemide increased. Feeling somewhat better. His wife is concerned that  his weight has come down about 5 lbs over the past month. His primary complaint is fatigue. Not exercising. He is able to get around the house and do his ADLs better since last visit. Plans on starting cardiac rehab next month. UOP slightly less by patients estimate. Denies CP or palpitations. Taking all medications as directed.   Labs (6/16): BNP 1954, TSH normal Labs (7/16): K 4.1, creatinine 1.72 Labs (8/16): creatinine 2.3 Labs (9/16): digoxin 0.3, K 4, creatinine 2.14, SPEP negative, UPEP negative Labs (10/16): K 4.6, creatinine 1.62 Labs (11/16): K 4.4, creatinine 2.33, digoxin 0.5 Labs (12/16): creatinine 2.1 (nephrology) Labs (1/17): digoxin 0.6, uric acid 10.4 Labs (2/17): K 4, creatinine 2.13, hgb 11.6 Labs (5/17): K 3.8, creatinine 2.22, digoxin 0.7 Labs (9/17): K 4.4, creatinine 2.88, LFTs normal, digoxin 1.0, hgb 11.8, LDL 88, HDL 50 Labs (1/18): digoxin 0.5 Labs (3/18): K 4.4, creatinine 3.17 Labs (4/18): K 4.3, creatinine 3.39, digoxin 0.6 Labs (10/18): K 4.5, creatinine 3.65, digoxin 0.4 Labs (11/18): K 4, creatinine 3.24, digoxin 0.6 Labs (12/18): digoxin < 0.2 Labs (1/19): K 3.8, creatinine 3.15 Labs (2/19): K 4.0, creatinine 3.23 Labs (3/19): digoxin 0.4, K 3.8, creatinine 3.19 Labs (6/19): digoxin 0.3, K 3.9, creatinine 3.12 Labs (9/19): K 3.9, creatinine 3.58, digoxin 0.3, LDL 91  PMH: 1. CKD: Stage IV.  Follows with Dr Posey Pronto.  2. Gout 3. Chronic systolic CHF: Nonischemic cardiomyopathy.  EF 40-45% in  2010.  Echo (6/16) with EF 20-25%, severe LV dilation, mild MR, mild AI, moderate LAE.  LHC/RHC (6/16) with nonobstructive CAD; mean RA 2, RV 48/1, PA 47/18, mean PCWP 12, CI 3.2.  CPX (10/16) with peak VO2 13.1 (50% predicted), VE/VCO2 slope 39.3, RER 1.22 => moderate to severe functional limitation.  Echo (12/16) with EF 15-20%, severe LV dilation, diffuse hypokinesis, moderate AI, mild MR, normal RV size and systolic function. Medtronic ICD.  - CPX (11/17):  Submaximal with RER 0.95, VE/VCO2 slope 27, peak VO2 18.2.  Suspect no more than mild HF limitation.  - Echo (1/18): EF 25%, diffuse hypokinesis, normal RV size and systolic function, mild AI.  - Echo (6/19): EF 25-30% with mild LV dilation and mild-moderate AI, normal RV size with mildly decreased systolic function, IVC normal.  4. ACEI cough.  5. Pleural effusion: Thoracentesis on right in 8/16.  It was a transudate, likely due to CHF.  6. OSA: To start Bipap.  7. Esophageal duplication cyst.  8. High resolution CT chest 12/17: No evidence for interstitial lung disease.  9. Atrial fibrillation: Paroxysmal, 1 10 minute episode in 3/18.  10. VT: ICD discharge 3/18.   SH: Married, retired Pharmacist, hospital, never smoked, no ETOH.   FH: Mother with "weak heart." Brother with sickle cell anemia.   Review of systems complete and found to be negative unless listed in HPI.    Current Outpatient Medications  Medication Sig Dispense Refill  . allopurinol (ZYLOPRIM) 100 MG tablet TAKE 2 TABLETS (200 MG TOTAL) BY MOUTH DAILY. (Patient taking differently: Take 150 mg by mouth every evening. ) 180 tablet 3  . amiodarone (PACERONE) 200 MG tablet Take 1 tablet (200 mg total) by mouth 2 (two) times daily for 30 days. 60 tablet 0  . carvedilol (COREG) 6.25 MG tablet Take 1 tablet (6.25 mg total) by mouth 2 (two) times daily with a meal for 30 days. 60 tablet 0  . Cholecalciferol (VITAMIN D3) 5000 units TABS Take 5,000 Units by mouth every morning.     . colchicine 0.6 MG tablet Take 0.6 mg by mouth daily as needed (gout flare up).    . feeding supplement, ENSURE ENLIVE, (ENSURE ENLIVE) LIQD Take 237 mLs by mouth 2 (two) times daily between meals for 30 days. 14220 mL 0  . ferric citrate (AURYXIA) 1 GM 210 MG(Fe) tablet Take 210 mg by mouth daily.    . furosemide (LASIX) 40 MG tablet Take 2 tabs (80 mg total) by mouth twice a day. 360 tablet 3  . hydrALAZINE (APRESOLINE) 25 MG tablet Take 0.5 tablets (12.5 mg total)  by mouth every 8 (eight) hours for 30 days. 45 tablet 0  . isosorbide mononitrate (IMDUR) 30 MG 24 hr tablet TAKE 1 TABLET (30 MG TOTAL) BY MOUTH DAILY. (Patient taking differently: Take 30 mg by mouth at bedtime. ) 30 tablet 6   No current facility-administered medications for this visit.    Vitals:   12/12/18 1410  BP: 124/70  Pulse: 71  SpO2: 99%  Weight: 73.6 kg (162 lb 3.2 oz)    Wt Readings from Last 3 Encounters:  12/12/18 73.6 kg (162 lb 3.2 oz)  11/21/18 76.3 kg (168 lb 3.2 oz)  11/17/18 73.3 kg (161 lb 9.6 oz)    Physical Exam General: Well appearing. No resp difficulty. HEENT: Normal Neck: Supple. JVP 5-6. Carotids 2+ bilat; no bruits. No thyromegaly or nodule noted. Cor: PMI nondisplaced. RRR, No M/G/R noted Lungs: CTAB, normal effort. Abdomen:  Soft, non-tender, non-distended, no HSM. No bruits or masses. +BS  Extremities: No cyanosis, clubbing, or rash. R and LLE no edema.  Neuro: Alert & orientedx3, cranial nerves grossly intact. moves all 4 extremities w/o difficulty. Affect pleasant   Assessment/Plan:  1. Chronic chronic systolic CHF, NICM CO preserved on RHC in 6/16, but 10/16 CPX showed moderate to severe functional impairment (seems worse than his symptoms would suggest). However, repeat CPX in 11/17, though submaximal, suggested no more than mild HF limitation. No significant CAD with coronary angiography in 6/16. No hx heavy ETOH or drug abuse. No marked HTN. Negative SPEP/UPEP. Possible prior myocarditis. ?familial cardiomyopathy =>mother had "weak heart" of uncertain etiology. He now has Medtronic ICD. Echo in 6/19 showed EF 25-30% (stable) with mild-moderate AI and mildly decreased RV systolic function. NYHA III symptoms or so after getting out of recent admission.  - Volume status looks OK on exam.  - Continue lasix 80 mg BID. BMET today.  - Continue coreg 3.125 mg BID - No dig with CKD IV.  - Continue hydralazine 12.5 mg TID for now.  - Continue  imdur 30 mg daily.  - No ACE/ARB/ARNI or spiro with CKD IV. - Renal function as below.  2. ARF on CKD IV - Cr 3.26 11/17/2018. BMET today.  3. OSA: - BiPAP at home.  4.H/oVT: Appropriate ICD discharge in 3/18.  - Had frequent NSVT and PVCs during recent admission/  - Continue coreg 3.125 mg BID - Decrease amiodarone to 100 mg mg daily with concerns for side effects. CMET and TFTs today.  5. Atrial fibrillation: Paroxysmal. Very short episodespreviously notedby device interrogation. He has had no prolonged or symptomatic atrial fibrillation (data for anticoagulating short runs of atrial fibrillation is not strong), so will hold off on anticoagulation.  - If he develops prolonged or symptomatic atrial fibrillation, would anticoagulate. - He is currently on amiodarone due to frequent NSVT/PVCs. No change.   Feeling slightly better and weight down after recent diuretics adjustment. Labs today. Keep appt for next month with Dr. Leo Rod, PA-C  12/12/2018   Greater than 50% of the 25 minute visit was spent in counseling/coordination of care regarding disease state education, salt/fluid restriction, sliding scale diuretics, and medication compliance.

## 2018-12-12 NOTE — Patient Instructions (Signed)
DECREASE Amiodarone to 100 mg, one tab daily   Labs today We will only contact you if something comes back abnormal or we need to make some changes. Otherwise no news is good news!   Keep follow up as scheduled   Do the following things EVERYDAY: 1) Weigh yourself in the morning before breakfast. Write it down and keep it in a log. 2) Take your medicines as prescribed 3) Eat low salt foods-Limit salt (sodium) to 2000 mg per day.  4) Stay as active as you can everyday 5) Limit all fluids for the day to less than 2 liters

## 2018-12-12 NOTE — Telephone Encounter (Signed)
Notes recorded by Kerry Dory, CMA on 12/12/2018 at 4:12 PM EST Patient aware. Repeat labs 3/5  ------  Notes recorded by Shirley Friar, PA-C on 12/12/2018 at 4:00 PM EST Normal TFTs. (T3 pending, but TSH and T4 WNL) ------  Notes recorded by Shirley Friar, PA-C on 12/12/2018 at 3:51 PM EST His creatinine is worse again having gone up on torsemide. Hold two doses of torsemide then go back to 80 mg q am and 40 mg q pm.  Needs repeat one week. Will also forward to Dr. Aundra Dubin as an Juluis Rainier. He sees Dr. Posey Pronto soon.   TFTs pending.   Miguel Roach [address]" [place], PA-C 12/12/2018 3:50 PM

## 2018-12-12 NOTE — Telephone Encounter (Signed)
-----   Message from Shirley Friar, PA-C sent at 12/12/2018  3:51 PM EST ----- His creatinine is worse again having gone up on torsemide. Hold two doses of torsemide then go back to 80 mg q am and 40 mg q pm.    Needs repeat one week. Will also forward to Dr. Aundra Dubin as an Juluis Rainier. He sees Dr. Posey Pronto soon.   TFTs pending.   Legrand Como [address]" [place], PA-C 12/12/2018 3:50 PM

## 2018-12-13 LAB — T3, FREE: T3, Free: 1.6 pg/mL — ABNORMAL LOW (ref 2.0–4.4)

## 2018-12-16 ENCOUNTER — Other Ambulatory Visit (HOSPITAL_COMMUNITY): Payer: Self-pay | Admitting: Cardiology

## 2018-12-18 ENCOUNTER — Ambulatory Visit (INDEPENDENT_AMBULATORY_CARE_PROVIDER_SITE_OTHER): Payer: Medicare Other

## 2018-12-18 DIAGNOSIS — I5022 Chronic systolic (congestive) heart failure: Secondary | ICD-10-CM

## 2018-12-18 DIAGNOSIS — Z95810 Presence of automatic (implantable) cardiac defibrillator: Secondary | ICD-10-CM

## 2018-12-18 NOTE — Progress Notes (Signed)
EPIC Encounter for ICM Monitoring  Patient Name: Miguel Roach is a 78 y.o. male Date: 12/18/2018 Primary Care Physican: Deland Pretty, MD Primary Cardiologist:[place] Electrophysiologist: Lovena Le Nephrologist: [facility] - Posey Pronto Last Weight:158.8lbs Today's Weight:Unknown                                                            Attempted call to patient and unable to reach.  Left detailed message per DPR regarding transmission. Transmission reviewed.  Hospitalized 11/11/2018 - 11/17/2018 to r/o sepsis.     Report: Thoracic impedance remains abnormal suggesting fluid accumulation starting 11/11/2018 which correlates with hospitalization.  Impedance remains decreased.    Prescribed: Furosemide 40 mg take 2 tablets (80 mg total) bymouth every morningand 1 tablet (40 mg total)everyevening. Torsemide decreased by Oda Kilts, PA on 2/27 due to increase in Creatinine.  Labs: 12/12/2018 Creatinine 4.16, BUN 60, Potassium 4.2, Sodium 139, GFR 13-15 11/17/2018 Creatinine 3.26, BUN 65, Potassium 4.0, Sodium 147, GFR 17-20 11/16/2018 Creatinine 3.07, BUN 63, Potassium 4.1, Sodium 149, GFR 19-22  11/15/2018 Creatinine 2.99, BUN 67, Potassium 3.6, Sodium 149, GFR 19-22  11/14/2018 Creatinine 3.28, BUN 78, Potassium 3.6, Sodium 150, GFR 17-20  11/13/2018 Creatinine 3.71, BUN 87, Potassium 3.5, Sodium 148, GFR 15-17  11/12/2018 Creatinine 4.96, BUN 98, Potassium 3.5, Sodium 144, GFR 10-12  11/11/2018 Creatinine 5.55, BUN 112, Potassium 3.6, Sodium 142, GFR 9-11  10/14/2018 Creatinine 3.05, BUN 41, Potassium 3.5, Sodium 142, GFR 19-22  07/05/2018 Creatinine 3.58, BUN 58, Potassium 3.9, Sodium 142, GFR 15-18 04/01/2018 Creatinine 3.12, BUN 42, Potassium 3.9, Sodium 144, GFR 18-21 01/02/2018 Creatinine 3.19, BUN 42, Potassium 3.8, Sodium 140, GFR 17-20 12/03/2017 Creatinine 3.23, BUN 44, Potassium 4.0, Sodium 145, GFR 18-20 11/12/2017 Creatinine 3.15, BUN 44, Potassium 3.8, Sodium 141,  GFR 18-21 A complete set of results can be found in Results Review.  Recommendations:  Left voice mail with ICM number requesting call back.  Follow-up plan: ICM clinic phone appointment on 12/24/2018 to recheck fluid levels.   Office appt 01/08/2019 with Dr Aundra Dubin.    Copy of ICM check sent to Dr. Lovena Le and Dr Aundra Dubin for review and recommendations if needed.   3 month ICM trend: 12/18/2018    1 Year ICM trend:       Rosalene Billings, RN 12/18/2018 2:57 PM

## 2018-12-18 NOTE — Progress Notes (Signed)
Patient returned call.  He reported the Furosemide was on hold for 3 days in the past week.  He has resumed Furosemide now and taking 80 mg twice a day alternating with 80 mg AM and 40 mg PM.  He is limiting fluid intake <64 oz.  He denies any fluid symptoms.  No changes today and advised send copy to Dr Aundra Dubin and Dr Lovena Le for review and if any recommendations will call back.  Recheck fluid levels 12/24/2018.  He will have BMET drawn tomorrow at Dr Claris Gladden office.

## 2018-12-19 ENCOUNTER — Ambulatory Visit (HOSPITAL_COMMUNITY)
Admission: RE | Admit: 2018-12-19 | Discharge: 2018-12-19 | Disposition: A | Discharge status: Home / Self Care | Payer: Medicare Other | Source: Ambulatory Visit | Attending: Internal Medicine | Admitting: Internal Medicine

## 2018-12-19 DIAGNOSIS — I5022 Chronic systolic (congestive) heart failure: Secondary | ICD-10-CM | POA: Diagnosis not present

## 2018-12-19 LAB — BASIC METABOLIC PANEL
Anion gap: 10 (ref 5–15)
BUN: 50 mg/dL — ABNORMAL HIGH (ref 8–23)
CHLORIDE: 106 mmol/L (ref 98–111)
CO2: 24 mmol/L (ref 22–32)
Calcium: 9.3 mg/dL (ref 8.9–10.3)
Creatinine, Ser: 3.71 mg/dL — ABNORMAL HIGH (ref 0.61–1.24)
GFR calc Af Amer: 17 mL/min — ABNORMAL LOW (ref >60)
GFR calc non Af Amer: 15 mL/min — ABNORMAL LOW (ref >60)
Glucose, Bld: 131 mg/dL — ABNORMAL HIGH (ref 70–99)
Potassium: 4 mmol/L (ref 3.5–5.1)
SODIUM: 140 mmol/L (ref 135–145)

## 2018-12-22 NOTE — Progress Notes (Signed)
Agree with 80 mg bid alternating with 80/40 for the furosemide for now.

## 2018-12-23 ENCOUNTER — Telehealth (HOSPITAL_COMMUNITY): Payer: Self-pay

## 2018-12-23 NOTE — Telephone Encounter (Signed)
Called patient to see if he was interested in participating in the Cardiac Rehab Program. Patient stated yes. Patient will come in for orientation on 01/09/2019 @ 830AM and will attend the 11:15AM exercise class.  Mailed homework package.

## 2018-12-24 ENCOUNTER — Ambulatory Visit (INDEPENDENT_AMBULATORY_CARE_PROVIDER_SITE_OTHER): Payer: Medicare Other

## 2018-12-24 DIAGNOSIS — I5022 Chronic systolic (congestive) heart failure: Secondary | ICD-10-CM

## 2018-12-24 DIAGNOSIS — Z95810 Presence of automatic (implantable) cardiac defibrillator: Secondary | ICD-10-CM

## 2018-12-24 NOTE — Progress Notes (Signed)
EPIC Encounter for ICM Monitoring  Patient Name: Miguel Roach is a 78 y.o. male Date: 12/24/2018 Primary Care Physican: Deland Pretty, MD Primary [place] Electrophysiologist: Lovena Le Nephrologist: [facility] - Posey Pronto Last Weight:158.8lbs Today's Weight:157 lbs   Heart failure questions reviewed and denied fluid symptoms.  Hospitalized 11/11/2018 - 11/17/2018 to r/o sepsis. He said energy level has returned and he is feeling good.  Report: Thoracic impedance remains abnormalsuggesting fluid accumulation starting 1/27/2020which correlates with hospitalization. Impedance remains decreased.   Prescribed:Furosemide 40 mg take 2 tablets (80 mg total) twice a day alternating with 80 mg AM and 40 mg PM.   Labs:  12/12/2018 Creatinine 4.16, BUN 60, Potassium 4.2, Sodium 139, GFR 13-15 11/17/2018 Creatinine3.26, BUN65, Potassium4.0, [id number], GFR17-20 11/16/2018 Creatinine3.07, BUN63, Potassium4.1, [id number], ESP23-30  11/15/2018 Creatinine2.99, BUN67, Potassium3.6, [id number], LKT62-56  11/14/2018 Creatinine3.28, BUN78, Potassium3.6, Sodium150, LSL37-34  01/29/2020Creatinine 3.71, BUN87, Potassium3.5, KAJGOT157, WIO03-55  01/28/2020Creatinine 4.96, BUN98, Potassium3.5, [id number], GFR10-12  01/27/2020Creatinine 5.55, BUN112, Potassium3.6, Sodium142, GFR9-11  12/30/2019Creatinine 3.05, BUN41, Potassium3.5, XMIWOE321, YYQ82-50 07/05/2018 Creatinine 3.58, BUN 58, Potassium 3.9, Sodium 142, GFR 15-18 04/01/2018 Creatinine 3.12, BUN 42, Potassium 3.9, Sodium 144, GFR 18-21 01/02/2018 Creatinine 3.19, BUN 42, Potassium 3.8, Sodium 140, GFR 17-20 12/03/2017 Creatinine 3.23, BUN 44, Potassium 4.0, Sodium 145, GFR 18-20 11/12/2017 Creatinine 3.15, BUN 44, Potassium 3.8, Sodium 141, GFR 18-21 A complete set of results can be found in Results Review.  Recommendations: Advised will call back if Dr Aundra Dubin has any recommendations.     Follow-up plan: ICM clinic phone appointment on3/17/2020 (manual send) to recheck fluid levels. Office appt 01/08/2019 with Dr Aundra Dubin.   Copy of ICM check sent to Dr.Taylor and Dr Aundra Dubin for review and recommendations if needed.   3 month ICM trend: 12/24/2018    1 Year ICM trend:       Rosalene Billings, RN 12/24/2018 2:38 PM

## 2018-12-29 NOTE — Progress Notes (Signed)
No changes for now.

## 2018-12-30 ENCOUNTER — Telehealth (HOSPITAL_COMMUNITY): Payer: Self-pay

## 2018-12-30 NOTE — Telephone Encounter (Signed)
Called and left pt vm to let pt know that the department will be closed for the next 2 weeks.. canceled apts until then.

## 2019-01-01 ENCOUNTER — Telehealth (HOSPITAL_COMMUNITY): Payer: Self-pay

## 2019-01-01 NOTE — Telephone Encounter (Signed)
Called patient to reschedule appointment currently on 3/25 d/t current state of emergency with Corona virus.  Pt reports feeling well with no exposure to anyone with a fever or cough.  No recent travel.  Pt denies cardiac symptoms currently.  Pt amendable to rescheduling appt from next week.  Pt encouraged to call office if any concerns at home ie change in status, refills etc. Appointment made for june and appointment card mailed. Verbalized understanding.

## 2019-01-02 ENCOUNTER — Ambulatory Visit (INDEPENDENT_AMBULATORY_CARE_PROVIDER_SITE_OTHER): Payer: Medicare Other

## 2019-01-02 ENCOUNTER — Other Ambulatory Visit: Payer: Self-pay

## 2019-01-02 DIAGNOSIS — Z95810 Presence of automatic (implantable) cardiac defibrillator: Secondary | ICD-10-CM

## 2019-01-02 DIAGNOSIS — I5022 Chronic systolic (congestive) heart failure: Secondary | ICD-10-CM

## 2019-01-03 ENCOUNTER — Ambulatory Visit (INDEPENDENT_AMBULATORY_CARE_PROVIDER_SITE_OTHER): Payer: Medicare Other

## 2019-01-03 ENCOUNTER — Other Ambulatory Visit: Payer: Self-pay

## 2019-01-03 ENCOUNTER — Telehealth: Payer: Self-pay

## 2019-01-03 DIAGNOSIS — Z95810 Presence of automatic (implantable) cardiac defibrillator: Secondary | ICD-10-CM

## 2019-01-03 DIAGNOSIS — I5022 Chronic systolic (congestive) heart failure: Secondary | ICD-10-CM

## 2019-01-03 NOTE — Telephone Encounter (Signed)
Attempted call to patient.  Message left to send remote transmission today to recheck fluid levels.

## 2019-01-03 NOTE — Progress Notes (Signed)
EPIC Encounter for ICM Monitoring  Patient Name: Miguel Roach is a 78 y.o. male Date: 01/03/2019 Primary Care Physican: Deland Pretty, MD Primary [place] Electrophysiologist: Lovena Le Nephrologist: [facility] - [facility] 12/24/2018 Weight:157 lbs 01/03/2019 Weight: 157.6  Heart failure questions reviewed and denied fluid symptoms.   Report: Thoracic impedancereturned to normal.   Prescribed:Furosemide 40 mg take2 tablets (80 mg total) twice a day alternating with 80 mg AM and 40 mg PM.   Labs:  12/12/2018 Creatinine 4.16, BUN 60, Potassium 4.2, Sodium 139, GFR 13-15 11/17/2018 Creatinine3.26, BUN65, Potassium4.0, [id number], GFR17-20 11/16/2018 Creatinine3.07, BUN63, Potassium4.1, [id number], WKM62-86  11/15/2018 Creatinine2.99, BUN67, Potassium3.6, NOTRRN165, BXU38-33  11/14/2018 Creatinine3.28, BUN78, Potassium3.6, Sodium150, XOV29-19  01/29/2020Creatinine 3.71, BUN87, Potassium3.5, TYOMAY045, TXH74-14  01/28/2020Creatinine 4.96, BUN98, Potassium3.5, ELTRVU023, GFR10-12  01/27/2020Creatinine 5.55, BUN112, Potassium3.6, Sodium142, GFR9-11  12/30/2019Creatinine 3.05, BUN41, Potassium3.5, [id number], OHF29-02 07/05/2018 Creatinine 3.58, BUN 58, Potassium 3.9, Sodium 142, GFR 15-18 04/01/2018 Creatinine 3.12, BUN 42, Potassium 3.9, Sodium 144, GFR 18-21 01/02/2018 Creatinine 3.19, BUN 42, Potassium 3.8, Sodium 140, GFR 17-20 12/03/2017 Creatinine 3.23, BUN 44, Potassium 4.0, Sodium 145, GFR 18-20 11/12/2017 Creatinine 3.15, BUN 44, Potassium 3.8, Sodium 141, GFR 18-21 A complete set of results can be found in Results Review.  Recommendations:No changes and encourage to call for fluid symptoms.   Follow-up plan: ICM clinic phone appointment on3/30/2020. Office apptwith Dr Aundra Dubin on 01/08/2019 postponed due to COVID 19 Virus.   Copy of ICM check sent to Dr.Taylor.  3 month ICM trend: 01/03/2019    1 Year  ICM trend:       Rosalene Billings, RN 01/03/2019 2:07 PM

## 2019-01-06 NOTE — Progress Notes (Signed)
EPIC Encounter for ICM Monitoring  Patient Name: Miguel Roach is a 78 y.o. male Date: 01/06/2019 Primary Care Physican: Deland Pretty, MD Primary Cardiologist:[place] Electrophysiologist: Lovena Le Nephrologist: [facility] - Patel 12/24/2018 Weight:157 lbs 01/03/2019 Weight: 157.6  Patient called on 01/02/2019.    Report: Thoracic impedancereturned to normal.   Prescribed:Furosemide 40 mgtake2 tablets (80 mgtotal)twice a day alternating with 80 mg AM and 40 mg PM.  Labs:  12/12/2018 Creatinine 4.16, BUN 60, Potassium 4.2, Sodium 139, GFR 13-15 11/17/2018 Creatinine3.26, BUN65, Potassium4.0, [id number], GFR17-20 11/16/2018 Creatinine3.07, BUN63, Potassium4.1, [id number], UYZ70-96  11/15/2018 Creatinine2.99, BUN67, Potassium3.6, [id number], RFV43-60  11/14/2018 Creatinine3.28, BUN78, Potassium3.6, Sodium150, OVP03-40  01/29/2020Creatinine 3.71, BUN87, Potassium3.5, BTCYEL859, MBP11-21  01/28/2020Creatinine 4.96, BUN98, Potassium3.5, [id number], GFR10-12  01/27/2020Creatinine 5.55, BUN112, Potassium3.6, Sodium142, GFR9-11  12/30/2019Creatinine 3.05, BUN41, Potassium3.5, Sodium142, KUV75-05 07/05/2018 Creatinine 3.58, BUN 58, Potassium 3.9, Sodium 142, GFR 15-18 04/01/2018 Creatinine 3.12, BUN 42, Potassium 3.9, Sodium 144, GFR 18-21 01/02/2018 Creatinine 3.19, BUN 42, Potassium 3.8, Sodium 140, GFR 17-20 12/03/2017 Creatinine 3.23, BUN 44, Potassium 4.0, Sodium 145, GFR 18-20 11/12/2017 Creatinine 3.15, BUN 44, Potassium 3.8, Sodium 141, GFR 18-21 A complete set of results can be found in Results Review.  Recommendations:None   Follow-up plan: ICM clinic phone appointment on3/30/2020. Office apptwith Dr Aundra Dubin on 01/08/2019 postponed due to COVID 19 Virus.   Copy of 01/02/2019 ICM check sent to Dr.Taylor.   3 month ICM trend: 01/03/2019    1 Year ICM trend:       Rosalene Billings, RN 01/06/2019 11:16 AM

## 2019-01-07 ENCOUNTER — Other Ambulatory Visit (HOSPITAL_COMMUNITY): Payer: Self-pay | Admitting: *Deleted

## 2019-01-07 MED ORDER — CARVEDILOL 6.25 MG PO TABS: 6.2500 mg | ORAL_TABLET | Freq: Two times a day (BID) | ORAL | 3 refills | Status: DC

## 2019-01-08 ENCOUNTER — Encounter (HOSPITAL_COMMUNITY): Payer: Medicare Other | Admitting: Cardiology

## 2019-01-09 ENCOUNTER — Ambulatory Visit (HOSPITAL_COMMUNITY): Payer: Medicare Other

## 2019-01-13 ENCOUNTER — Other Ambulatory Visit: Payer: Self-pay

## 2019-01-13 ENCOUNTER — Ambulatory Visit (INDEPENDENT_AMBULATORY_CARE_PROVIDER_SITE_OTHER): Payer: Medicare Other

## 2019-01-13 DIAGNOSIS — I5022 Chronic systolic (congestive) heart failure: Secondary | ICD-10-CM | POA: Diagnosis not present

## 2019-01-13 DIAGNOSIS — Z95810 Presence of automatic (implantable) cardiac defibrillator: Secondary | ICD-10-CM | POA: Diagnosis not present

## 2019-01-14 NOTE — Progress Notes (Signed)
EPIC Encounter for ICM Monitoring  Patient Name: Miguel Roach is a 78 y.o. male Date: 01/14/2019 Primary Care Physican: Deland Pretty, MD Primary Cardiologist:[place] Electrophysiologist: Lovena Le Nephrologist: [facility] - [facility] 3/10/2020Weight:157 lbs 01/03/2019 Weight: 157.6 lbs 01/14/2019 Weight: unknown  Attempted call to patient and unable to reach.  Left detailed message per DPR regarding transmission. Transmission reviewed.    Report: Thoracic impedancereturned tonormal.   Prescribed:Furosemide 40 mgtake2 tablets (80 mgtotal)twice a day alternating with 80 mg AM and 40 mg PM.  Labs: 12/12/2018 Creatinine 4.16, BUN 60, Potassium 4.2, Sodium 139, GFR 13-15 11/17/2018 Creatinine3.26, BUN65, Potassium4.0, [id number], GFR17-20 11/16/2018 Creatinine3.07, BUN63, Potassium4.1, [id number], HQI16-42  11/15/2018 Creatinine2.99, BUN67, Potassium3.6, [id number], RAF42-55  11/14/2018 Creatinine3.28, BUN78, Potassium3.6, Sodium150, KZG94-83  01/29/2020Creatinine 3.71, BUN87, Potassium3.5, AFHSVE746, ACG98-47  01/28/2020Creatinine 4.96, BUN98, Potassium3.5, [id number], GFR10-12  01/27/2020Creatinine 5.55, BUN112, Potassium3.6, Sodium142, GFR9-11  12/30/2019Creatinine 3.05, BUN41, Potassium3.5, [id number], IDK22-84 07/05/2018 Creatinine 3.58, BUN 58, Potassium 3.9, Sodium 142, GFR 15-18 04/01/2018 Creatinine 3.12, BUN 42, Potassium 3.9, Sodium 144, GFR 18-21 01/02/2018 Creatinine 3.19, BUN 42, Potassium 3.8, Sodium 140, GFR 17-20 12/03/2017 Creatinine 3.23, BUN 44, Potassium 4.0, Sodium 145, GFR 18-20 11/12/2017 Creatinine 3.15, BUN 44, Potassium 3.8, Sodium 141, GFR 18-21 A complete set of results can be found in Results Review.  Recommendations:Left voice mail with ICM number and encouraged to call if experiencing any fluid symptoms.  Follow-up plan: ICM clinic phone appointment on5/09/2019.Office apptwith Dr Aundra Dubin  on3/25/2020postponed due to COVID 19 Virus.  Copy of 01/02/2019 ICM check sent to Dr.Taylor.  3 month ICM trend: 01/13/2019    1 Year ICM trend:       Rosalene Billings, RN 01/14/2019 11:58 AM

## 2019-01-15 ENCOUNTER — Ambulatory Visit (HOSPITAL_COMMUNITY): Payer: Medicare Other

## 2019-01-17 ENCOUNTER — Ambulatory Visit (HOSPITAL_COMMUNITY): Payer: Medicare Other

## 2019-01-22 ENCOUNTER — Ambulatory Visit (HOSPITAL_COMMUNITY): Payer: Medicare Other

## 2019-01-24 ENCOUNTER — Ambulatory Visit (HOSPITAL_COMMUNITY): Payer: Medicare Other

## 2019-01-29 ENCOUNTER — Ambulatory Visit (HOSPITAL_COMMUNITY): Payer: Medicare Other

## 2019-01-31 ENCOUNTER — Ambulatory Visit (HOSPITAL_COMMUNITY): Payer: Medicare Other

## 2019-02-05 ENCOUNTER — Ambulatory Visit (HOSPITAL_COMMUNITY): Payer: Medicare Other

## 2019-02-07 ENCOUNTER — Ambulatory Visit (HOSPITAL_COMMUNITY): Payer: Medicare Other

## 2019-02-12 ENCOUNTER — Ambulatory Visit (HOSPITAL_COMMUNITY): Payer: Medicare Other

## 2019-02-13 ENCOUNTER — Telehealth (HOSPITAL_COMMUNITY): Payer: Self-pay | Admitting: *Deleted

## 2019-02-13 NOTE — Telephone Encounter (Signed)
Called and spoke to pt regarding Cardiac Rehab continued closure due to adherence of national recommendation for Covid-19 in group setting.  Pt verbalized understanding and is interested in scheduling once permitted.  Pt participated in [place] in 2017.  Pt exercising everyday with walking with his wife with no complaints.  Pt wife teaches balance and core strengthening exercises.  She reviews these techniques with him.  Pt feels he has a food understanding of exercise and heart healthy eating with NA restrictions.  Pt manages his CHF well and weighs himself daily.  No other needs identified for him.  Pt appreciative of the call. Cherre Huger, BSN Cardiac and Training and development officer

## 2019-02-14 ENCOUNTER — Ambulatory Visit (HOSPITAL_COMMUNITY): Payer: Medicare Other

## 2019-02-15 IMAGING — DX DG CHEST 2V
2 series · 2 of 2 positions shown · non-contrast
Comparison: 11/25/2015

CLINICAL DATA: 78-year-old male weakness fever and decreased
appetite

EXAM:
CHEST - 2 VIEW

[chest pa]
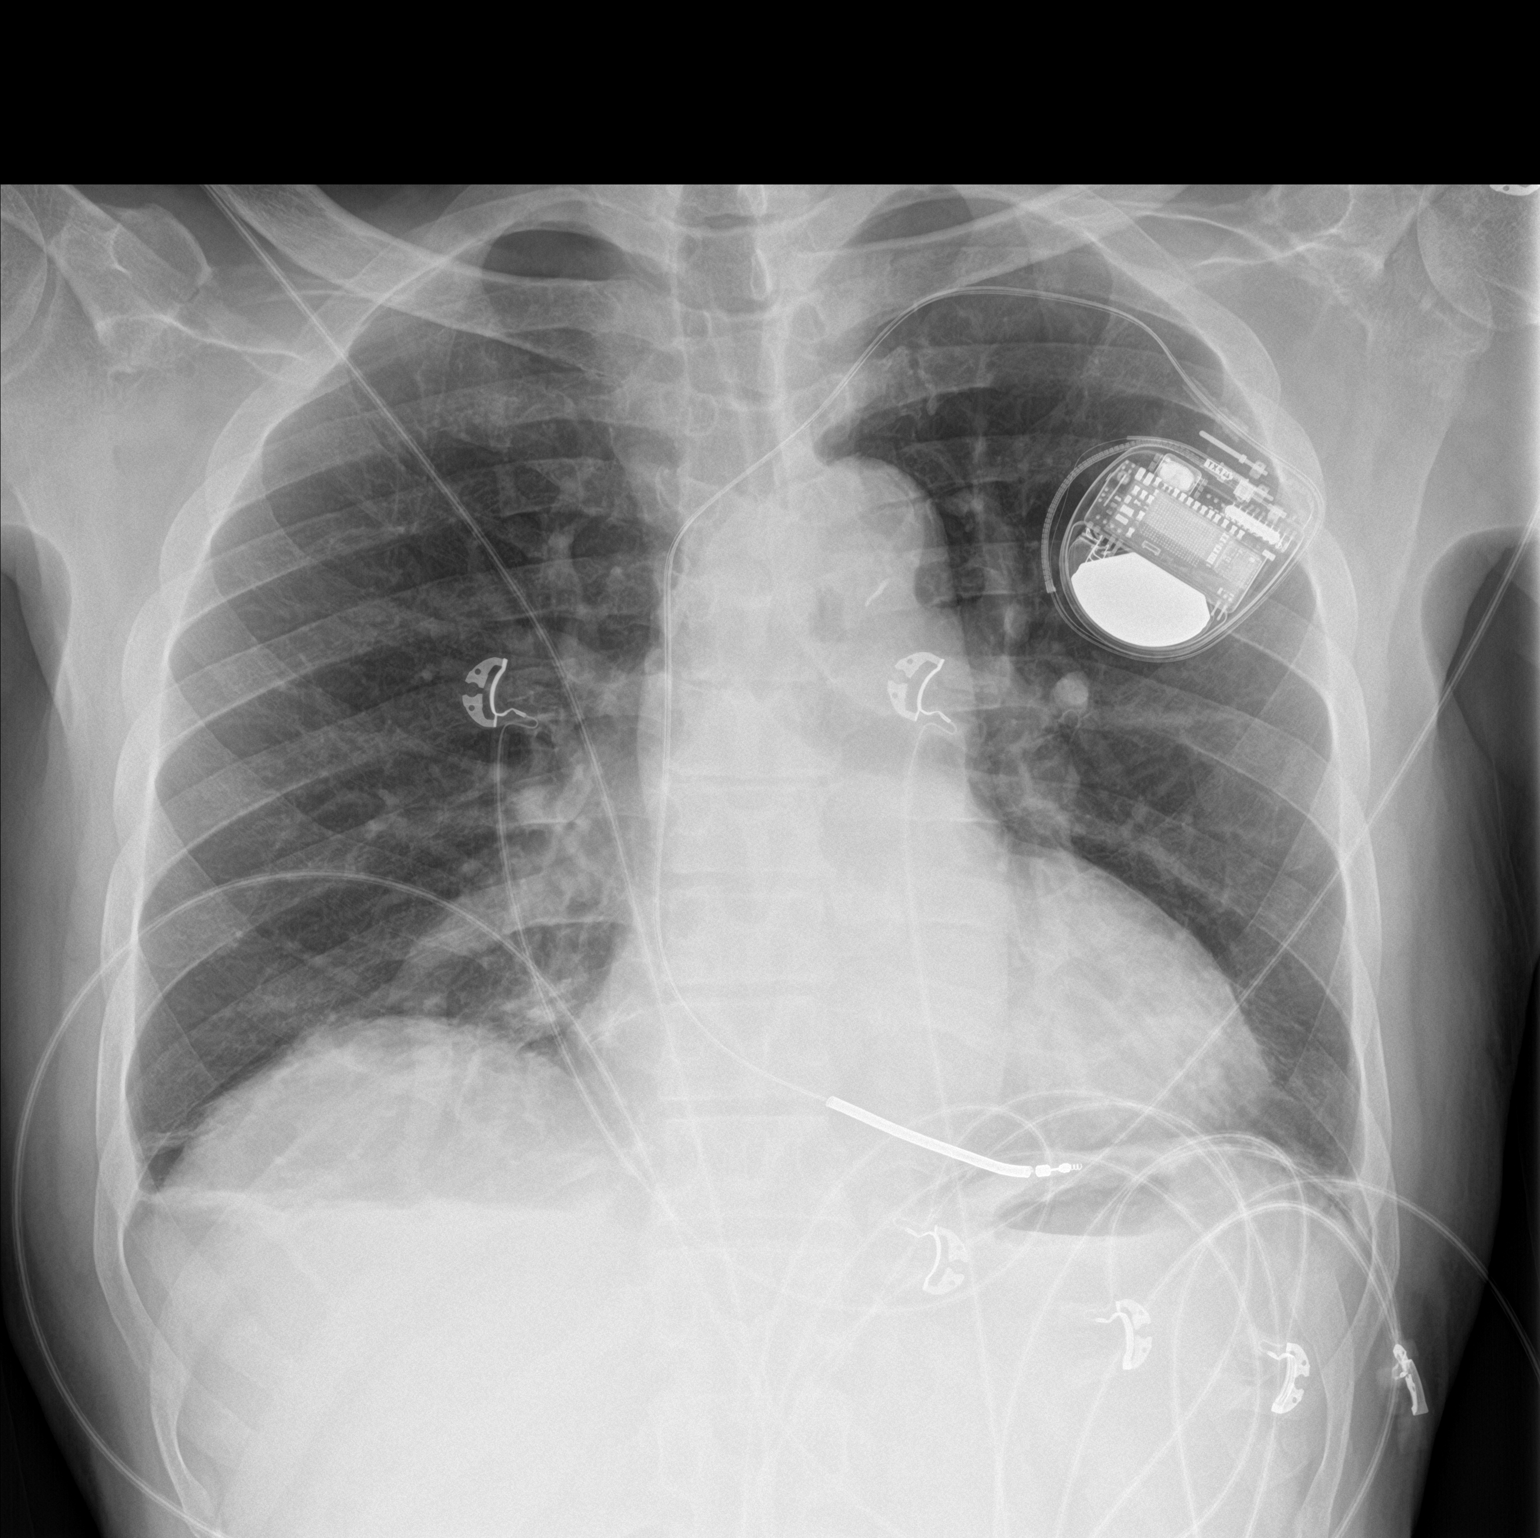

[chest lat]
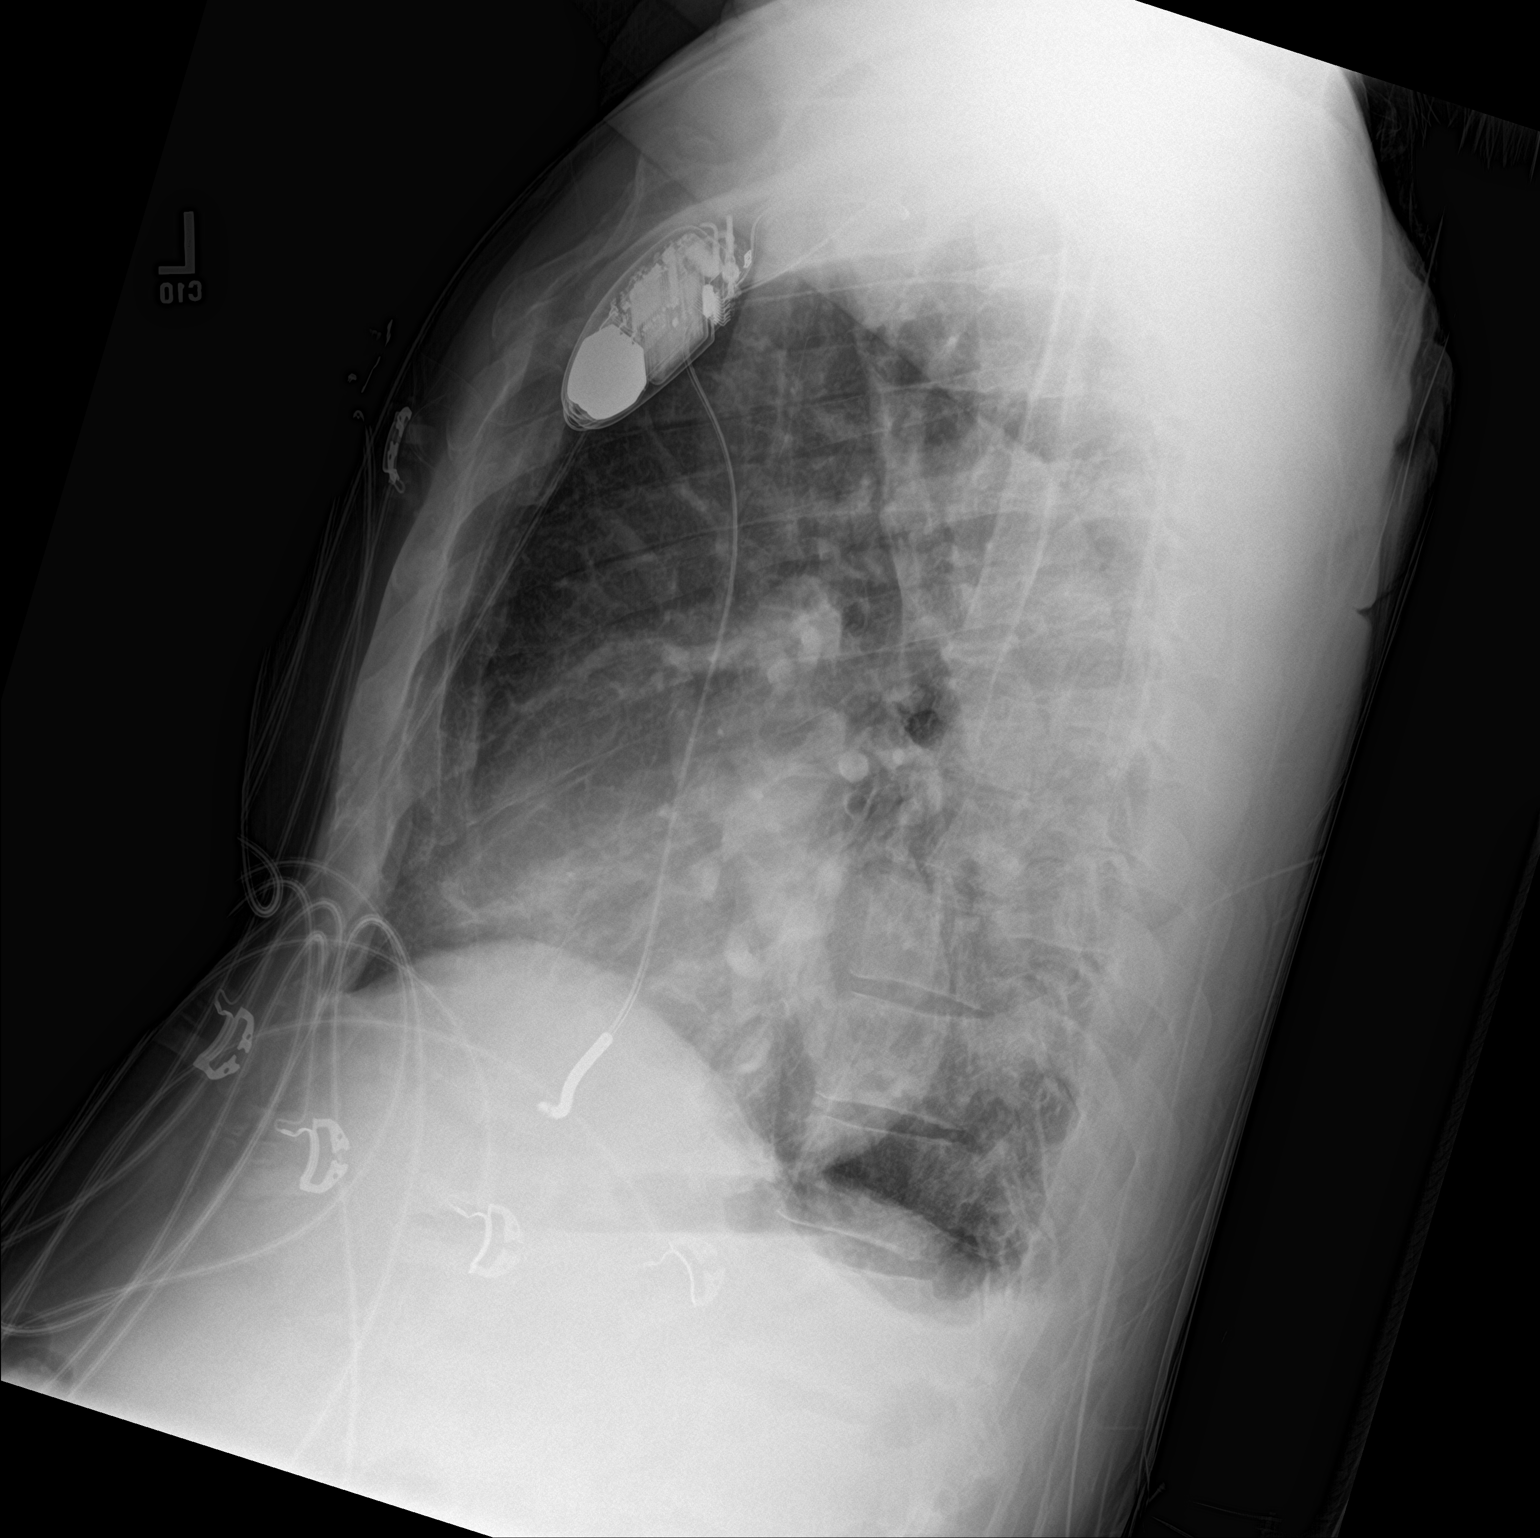

[2 of 2 positions shown; findings below may reference images not displayed]

FINDINGS: Cardiomediastinal silhouette unchanged in size and contour.

Cardiac pacing device/AICD on left chest wall, unchanged.

Linear opacities at the lung bases, similar to the comparison with
blunting of the costophrenic sulcus on the lateral view.

No pneumothorax.  No confluent airspace disease.
IMPRESSION: Chronic lung changes with emphysema and basilar scarring and no
evidence of acute cardiopulmonary disease.

Unchanged left-sided AICD

## 2019-02-19 ENCOUNTER — Ambulatory Visit (HOSPITAL_COMMUNITY): Payer: Medicare Other

## 2019-02-21 ENCOUNTER — Ambulatory Visit (HOSPITAL_COMMUNITY): Payer: Medicare Other

## 2019-02-24 ENCOUNTER — Other Ambulatory Visit: Payer: Self-pay

## 2019-02-24 ENCOUNTER — Ambulatory Visit (INDEPENDENT_AMBULATORY_CARE_PROVIDER_SITE_OTHER): Payer: Medicare Other | Admitting: *Deleted

## 2019-02-24 DIAGNOSIS — I5022 Chronic systolic (congestive) heart failure: Secondary | ICD-10-CM

## 2019-02-24 DIAGNOSIS — I420 Dilated cardiomyopathy: Secondary | ICD-10-CM

## 2019-02-25 ENCOUNTER — Other Ambulatory Visit: Payer: Self-pay

## 2019-02-25 ENCOUNTER — Ambulatory Visit (INDEPENDENT_AMBULATORY_CARE_PROVIDER_SITE_OTHER): Payer: Medicare Other

## 2019-02-25 DIAGNOSIS — Z95810 Presence of automatic (implantable) cardiac defibrillator: Secondary | ICD-10-CM

## 2019-02-25 DIAGNOSIS — I5022 Chronic systolic (congestive) heart failure: Secondary | ICD-10-CM | POA: Diagnosis not present

## 2019-02-25 LAB — CUP PACEART REMOTE DEVICE CHECK
Battery Remaining Longevity: 113 mo
Battery Voltage: 3.01 V
Brady Statistic RV Percent Paced: 0.17 %
Date Time Interrogation Session: 20200511041802
HighPow Impedance: 68 Ohm
Implantable Lead Implant Date: 20170208
Implantable Lead Location: 753860
Implantable Lead Model: 6935
Implantable Pulse Generator Implant Date: 20170208
Lead Channel Impedance Value: 361 Ohm
Lead Channel Impedance Value: 418 Ohm
Lead Channel Pacing Threshold Amplitude: 0.875 V
Lead Channel Pacing Threshold Pulse Width: 0.4 ms
Lead Channel Sensing Intrinsic Amplitude: 8.875 mV
Lead Channel Sensing Intrinsic Amplitude: 8.875 mV
Lead Channel Setting Pacing Amplitude: 2 V
Lead Channel Setting Pacing Pulse Width: 0.4 ms
Lead Channel Setting Sensing Sensitivity: 0.3 mV

## 2019-02-26 ENCOUNTER — Ambulatory Visit (HOSPITAL_COMMUNITY): Payer: Medicare Other

## 2019-02-28 ENCOUNTER — Telehealth: Payer: Self-pay

## 2019-02-28 ENCOUNTER — Ambulatory Visit (HOSPITAL_COMMUNITY): Payer: Medicare Other

## 2019-02-28 NOTE — Telephone Encounter (Signed)
Remote ICM transmission received.  Attempted call to patient regarding ICM remote transmission and left detailed message, per DPR, with next ICM remote transmission date of 03/31/2019.  Advised to return call for any fluid symptoms or questions.   

## 2019-02-28 NOTE — Progress Notes (Signed)
EPIC Encounter for ICM Monitoring  Patient Name: Miguel Roach is a 78 y.o. male Date: 02/28/2019 Primary Care Physican: Deland Pretty, MD Primary Cardiologist:[facility] Electrophysiologist: Lovena Le Nephrologist: Narda Amber Kidney - Posey Pronto 01/03/2019 Weight: 157.6 lbs   Attempted call to patient and unable to reach.  Left detailed message per DPR regarding transmission. Transmission reviewed.   Report: Thoracic impedancenormal.   Prescribed:Furosemide 40 mgtake2 tablets (80 mgtotal)twice a day alternating with 80 mg AM and 40 mg PM.  Labs: 12/12/2018 Creatinine 4.16, BUN 60, Potassium 4.2, Sodium 139, GFR 13-15 11/17/2018 Creatinine3.26, BUN65, Potassium4.0, [id number], GFR17-20 11/16/2018 Creatinine3.07, BUN63, Potassium4.1, [id number], DZH29-92  11/15/2018 Creatinine2.99, BUN67, Potassium3.6, EQASTM196, QIW97-98  11/14/2018 Creatinine3.28, BUN78, Potassium3.6, Sodium150, XQJ19-41  01/29/2020Creatinine 3.71, BUN87, Potassium3.5, DEYCXK481, EHU31-49  01/28/2020Creatinine 4.96, BUN98, Potassium3.5, [id number], IFO27-74  A complete set of results can be found in Results Review.  Recommendations:Left voice mail with ICM number and encouraged to call if experiencing any fluid symptoms.  Follow-up plan: ICM clinic phone appointment on6/15/2020.Office apptwith Dr Aundra Dubin on6/02/2019.  Copy of3/19/2020ICM check sent to Dr.Taylor.   3 month ICM trend: 02/24/2019    1 Year ICM trend:       Rosalene Billings, RN 02/28/2019 12:51 PM

## 2019-03-05 ENCOUNTER — Ambulatory Visit (HOSPITAL_COMMUNITY): Payer: Medicare Other

## 2019-03-06 NOTE — Progress Notes (Signed)
Remote ICD transmission.   

## 2019-03-07 ENCOUNTER — Ambulatory Visit (HOSPITAL_COMMUNITY): Payer: Medicare Other

## 2019-03-12 ENCOUNTER — Ambulatory Visit (HOSPITAL_COMMUNITY): Payer: Medicare Other

## 2019-03-14 ENCOUNTER — Ambulatory Visit (HOSPITAL_COMMUNITY): Payer: Medicare Other

## 2019-03-19 ENCOUNTER — Ambulatory Visit (HOSPITAL_COMMUNITY): Payer: Medicare Other

## 2019-03-20 ENCOUNTER — Telehealth (HOSPITAL_COMMUNITY): Payer: Self-pay

## 2019-03-20 NOTE — Telephone Encounter (Signed)
Phone call made to Pt to provide information about virtual CR. Pt was responsive but did not want to move forward with virtual CR. Pt stated he wanted to wait until CR reopened to begin his rehab.

## 2019-03-21 ENCOUNTER — Encounter (HOSPITAL_COMMUNITY): Payer: Medicare Other | Admitting: Cardiology

## 2019-03-21 ENCOUNTER — Ambulatory Visit (HOSPITAL_COMMUNITY): Payer: Medicare Other

## 2019-03-21 ENCOUNTER — Ambulatory Visit (HOSPITAL_COMMUNITY)
Admission: RE | Admit: 2019-03-21 | Discharge: 2019-03-21 | Disposition: A | Discharge status: Home / Self Care | Payer: Medicare Other | Source: Ambulatory Visit | Attending: Cardiology | Admitting: Cardiology

## 2019-03-21 ENCOUNTER — Other Ambulatory Visit: Payer: Self-pay

## 2019-03-21 ENCOUNTER — Encounter (HOSPITAL_COMMUNITY): Payer: Self-pay | Admitting: Cardiology

## 2019-03-21 VITALS — BP 132/78 | HR 61 | Wt 173.0 lb

## 2019-03-21 DIAGNOSIS — Z832 Family history of diseases of the blood and blood-forming organs and certain disorders involving the immune mechanism: Secondary | ICD-10-CM | POA: Insufficient documentation

## 2019-03-21 DIAGNOSIS — M109 Gout, unspecified: Secondary | ICD-10-CM | POA: Diagnosis not present

## 2019-03-21 DIAGNOSIS — N184 Chronic kidney disease, stage 4 (severe): Secondary | ICD-10-CM | POA: Insufficient documentation

## 2019-03-21 DIAGNOSIS — I5022 Chronic systolic (congestive) heart failure: Secondary | ICD-10-CM | POA: Insufficient documentation

## 2019-03-21 DIAGNOSIS — Z95810 Presence of automatic (implantable) cardiac defibrillator: Secondary | ICD-10-CM | POA: Insufficient documentation

## 2019-03-21 DIAGNOSIS — I428 Other cardiomyopathies: Secondary | ICD-10-CM | POA: Diagnosis not present

## 2019-03-21 DIAGNOSIS — I480 Paroxysmal atrial fibrillation: Secondary | ICD-10-CM | POA: Diagnosis not present

## 2019-03-21 DIAGNOSIS — G4733 Obstructive sleep apnea (adult) (pediatric): Secondary | ICD-10-CM | POA: Insufficient documentation

## 2019-03-21 DIAGNOSIS — R002 Palpitations: Secondary | ICD-10-CM

## 2019-03-21 DIAGNOSIS — I493 Ventricular premature depolarization: Secondary | ICD-10-CM | POA: Insufficient documentation

## 2019-03-21 DIAGNOSIS — Z79899 Other long term (current) drug therapy: Secondary | ICD-10-CM | POA: Diagnosis not present

## 2019-03-21 LAB — COMPREHENSIVE METABOLIC PANEL
ALT: 13 U/L (ref 0–44)
AST: 16 U/L (ref 15–41)
Albumin: 3.6 g/dL (ref 3.5–5.0)
Alkaline Phosphatase: 66 U/L (ref 38–126)
Anion gap: 7 (ref 5–15)
BUN: 44 mg/dL — ABNORMAL HIGH (ref 8–23)
CO2: 25 mmol/L (ref 22–32)
Calcium: 9.3 mg/dL (ref 8.9–10.3)
Chloride: 110 mmol/L (ref 98–111)
Creatinine, Ser: 3.94 mg/dL — ABNORMAL HIGH (ref 0.61–1.24)
GFR calc Af Amer: 16 mL/min — ABNORMAL LOW (ref >60)
GFR calc non Af Amer: 14 mL/min — ABNORMAL LOW (ref >60)
Glucose, Bld: 99 mg/dL (ref 70–99)
Potassium: 3.9 mmol/L (ref 3.5–5.1)
Sodium: 142 mmol/L (ref 135–145)
Total Bilirubin: 0.6 mg/dL (ref 0.3–1.2)
Total Protein: 6.7 g/dL (ref 6.5–8.1)

## 2019-03-21 LAB — TSH: TSH: 2.212 u[IU]/mL (ref 0.350–4.500)

## 2019-03-21 NOTE — Patient Instructions (Signed)
Lab work done today. We will notify you of any abnormal lab work. No news is good news!  Your provider has recommended that  you wear a Zio Patch for 3 days.  This monitor will record your heart rhythm for our review.  IF you have any symptoms while wearing the monitor please press the button.  If you have any issues with the patch or you notice a red or orange light on it please call the company at [phone].  Once you remove the patch please mail it back to the company as soon as possible so we can get the results.  Your physician has requested that you have an echocardiogram. Echocardiography is a painless test that uses sound waves to create images of your heart. It provides your doctor with information about the size and shape of your heart and how well your heart's chambers and valves are working. This procedure takes approximately one hour. There are no restrictions for this procedure. This will be done in 2 months with your appointment with Dr. Aundra Dubin.  Please follow up with your optometrist for an eye exam.  Please follow up with Dr. Aundra Dubin in 2 months.

## 2019-03-21 NOTE — Progress Notes (Signed)
Zio patch placed onto patient.  All instructions and information reviewed with patient, they verbalize understanding with no questions. 

## 2019-03-22 NOTE — Progress Notes (Signed)
P  Advanced Heart Failure Clinic Note   Patient ID: Miguel Roach, male   DOB: 1940-11-03, 78 y.o.   MRN: [id number] PCP: Dr. Shelia Media Cardiology: Dr. Aundra Dubin   78 y.o. with history of chronic systolic CHF/nonischemic cardiomyopathy and CKD presents for followup of CHF. He actually had an echo back in 2010 with EF 40-45% at the time.  He says that this really was not followed up upon by a cardiologist and that he did well for a number of years after that. In 6/16, he was admitted with acute systolic CHF.  Echo showed EF down to 20-25%.  Cardiac cath showed nonobstructive CAD.  His Lasix has been gradually increased.  This has brought his weight down and improved his breathing, but creatinine has risen. He had been on olmesartan but this was stopped because he thought it was causing cough.  He was then put on losartan, but this was stopped with rise in creatinine. Also intolerant to Bidil with orthostatic symptoms.  He was started on digoxin.  Echo in 12/16 showed persistently low EF, 15-20%.  Medtronic ICD was placed in 2/17.  Repeat echo 1/18 showed EF 25% with diffuse hypokinesis.   In 3/18, he had an episode of about 10 minutes atrial fibrillation noted on his ICD.  He also had 1 episode of VT terminated by an appropriate discharge.  He had push-mowed his grass and was very fatigued when he had the shock.  Since then, he has had no further arrhythmias.   Echo in 6/19 showed EF 25-30% with mild LV dilation and mild-moderate AI, normal RV size with mildly decreased systolic function, IVC normal.   Admitted 1/27 - 11/17/2018 with hypotension and found to have UTI/sepsis. Treated on ABX and IVF. Eventually transitioned back to po diuretics with HF team following. 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 Noted to have increased NSVT and PVCs, started on amiodarone.   He is finally starting feel back to normal after his sepsis admission.  He is eating better and has regained weight.  He is walking briskly for 1  mile/day without dyspnea.  No shortness of breath with stairs.  No orthopnea/PND.  Good energy level.  No lightheadedness or palpitations.    Labs (6/16): BNP 1954, TSH normal Labs (7/16): K 4.1, creatinine 1.72 Labs (8/16): creatinine 2.3 Labs (9/16): digoxin 0.3, K 4, creatinine 2.14, SPEP negative, UPEP negative Labs (10/16): K 4.6, creatinine 1.62 Labs (11/16): K 4.4, creatinine 2.33, digoxin 0.5 Labs (12/16): creatinine 2.1 (nephrology) Labs (1/17): digoxin 0.6, uric acid 10.4 Labs (2/17): K 4, creatinine 2.13, hgb 11.6 Labs (5/17): K 3.8, creatinine 2.22, digoxin 0.7 Labs (9/17): K 4.4, creatinine 2.88, LFTs normal, digoxin 1.0, hgb 11.8, LDL 88, HDL 50 Labs (1/18): digoxin 0.5 Labs (3/18): K 4.4, creatinine 3.17 Labs (4/18): K 4.3, creatinine 3.39, digoxin 0.6 Labs (10/18): K 4.5, creatinine 3.65, digoxin 0.4 Labs (11/18): K 4, creatinine 3.24, digoxin 0.6 Labs (12/18): digoxin < 0.2 Labs (1/19): K 3.8, creatinine 3.15 Labs (2/19): K 4.0, creatinine 3.23 Labs (3/19): digoxin 0.4, K 3.8, creatinine 3.19 Labs (6/19): digoxin 0.3, K 3.9, creatinine 3.12 Labs (9/19): K 3.9, creatinine 3.58, digoxin 0.3, LDL 91 Labs (3/20): K 4, creatinine 3.71  Medtronic device check: Stable thoracic impedance, no VT, no AF.   PMH: 1. CKD: Stage IV.  Follows with Dr Posey Pronto.  2. Gout 3. Chronic systolic CHF: Nonischemic cardiomyopathy.  EF 40-45% in 2010.  Echo (6/16) with EF 20-25%, severe LV dilation, mild MR, mild  AI, moderate LAE.  LHC/RHC (6/16) with nonobstructive CAD; mean RA 2, RV 48/1, PA 47/18, mean PCWP 12, CI 3.2.  CPX (10/16) with peak VO2 13.1 (50% predicted), VE/VCO2 slope 39.3, RER 1.22 => moderate to severe functional limitation.  Echo (12/16) with EF 15-20%, severe LV dilation, diffuse hypokinesis, moderate AI, mild MR, normal RV size and systolic function. Medtronic ICD.  - CPX (11/17): Submaximal with RER 0.95, VE/VCO2 slope 27, peak VO2 18.2.  Suspect no more than mild HF  limitation.  - Echo (1/18): EF 25%, diffuse hypokinesis, normal RV size and systolic function, mild AI.  - Echo (6/19): EF 25-30% with mild LV dilation and mild-moderate AI, normal RV size with mildly decreased systolic function, IVC normal.  4. ACEI cough.  5. Pleural effusion: Thoracentesis on right in 8/16.  It was a transudate, likely due to CHF.  6. OSA: To start Bipap.  7. Esophageal duplication cyst.  8. High resolution CT chest 12/17: No evidence for interstitial lung disease.  9. Atrial fibrillation: Paroxysmal, 1 10 minute episode in 3/18.  10. VT: ICD discharge 3/18.  11. 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   SH: Married, retired Pharmacist, hospital, never smoked, no ETOH.   FH: Mother with "weak heart." Brother with sickle cell anemia.   ROS: All systems reviewed and negative except as per HPI.   Current Outpatient Medications  Medication Sig Dispense Refill   allopurinol (ZYLOPRIM) 100 MG tablet TAKE 2 TABLETS (200 MG TOTAL) BY MOUTH DAILY. (Patient taking differently: Take 150 mg by mouth every evening. ) 180 tablet 3   carvedilol (COREG) 6.25 MG tablet Take 1 tablet (6.25 mg total) by mouth 2 (two) times daily with a meal. 60 tablet 3   Cholecalciferol (VITAMIN D3) 5000 units TABS Take 5,000 Units by mouth every morning.      colchicine 0.6 MG tablet Take 0.6 mg by mouth daily as needed (gout flare up).     ferric citrate (AURYXIA) 1 GM 210 MG(Fe) tablet Take 210 mg by mouth daily.     furosemide (LASIX) 40 MG tablet Take 2 tablets (80 mg total) by mouth every morning AND 1 tablet (40 mg total) every evening. Take 2 tabs (80 mg total) by mouth twice a day.. 360 tablet 3   isosorbide mononitrate (IMDUR) 30 MG 24 hr tablet TAKE 1 TABLET (30 MG TOTAL) BY MOUTH DAILY. 30 tablet 6   amiodarone (PACERONE) 100 MG tablet Take 1 tablet (100 mg total) by mouth daily for 30 days. 30 tablet 6   hydrALAZINE (APRESOLINE) 25 MG tablet Take 0.5 tablets (12.5 mg total) by mouth every 8 (eight) hours for  30 days. 45 tablet 0   No current facility-administered medications for this encounter.    Filed Weights   03/21/19 1116  Weight: 78.5 kg (173 lb)   BP 132/78    Pulse 61    Wt 78.5 kg (173 lb)    SpO2 99%    BMI 24.13 kg/m   General: NAD Neck: No JVD, no thyromegaly or thyroid nodule.  Lungs: Clear to auscultation bilaterally with normal respiratory effort. CV: Nondisplaced PMI.  Heart regular S1/S2, no S3/S4, no murmur.  No peripheral edema.  No carotid bruit.  Normal pedal pulses.  Abdomen: Soft, nontender, no hepatosplenomegaly, no distention.  Skin: Intact without lesions or rashes.  Neurologic: Alert and oriented x 3.  Psych: Normal affect. Extremities: No clubbing or cyanosis.  HEENT: Normal.   Assessment/Plan: 1. Chronic systolic CHF: Nonischemic cardiomyopathy.  CO preserved  on [place] in 6/16, but 10/16 CPX showed moderate to severe functional impairment (seems worse than his symptoms would suggest).  However, repeat CPX in 11/17, though submaximal, suggested no more than mild HF limitation. No significant CAD with coronary angiography in 6/16.  No hx heavy ETOH or drug abuse. No marked HTN.  Negative SPEP/UPEP.  Possible prior myocarditis.  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  He now has Medtronic ICD.  Echo in 6/19 showed EF 25-30% (stable) with mild-moderate AI and mildly decreased RV systolic function.  NYHA class II.  He does not appear volume overloaded by exam or Optivol.  - Continue alternating Lasix 80 mg bid with Lasix 80 qam/40 qpm. BMET today - Off spironolactone with elevated creatinine.    - Continue Coreg 6.25 mg bid. - Hold off on ACEI/ARB/Entresto at this time with elevated creatinine.  - Continue hydralazine 12.5 mg tid and Imdur 30 mg daily.   - He is off digoxin with rising creatinine.  - Repeat echo at followup in 2 months.  2. CKD: Stage IV.  BMET today, creatinine has been > 3 recently.  He follows with Dr Posey Pronto every 3  months 3. OSA: Bipap. 4. Gout: Gout controlled on allopurinol. 5. VT/PVCs: Appropriate ICD discharge in 3/18, no recurrence. He is now on amiodarone due to very frequent PVCs.  - Continue Coreg - Continue amiodarone 100 mg daily. Will have him wear Zio patch x 3 days to make sure that PVCs are surpressed.  Check LFTs and TSH. He will need a regular eye exam.  6. Atrial fibrillation: Paroxysmal.  Very short episodes only noted by device interrogation, none recently.  He has had no prolonged or symptomatic atrial fibrillation (data for anticoagulating short runs of atrial fibrillation is not strong), so will hold off on anticoagulation.  - If he develops prolonged or symptomatic atrial fibrillation, would anticoagulate.   Followup in 2 months.   [name] 03/22/2019

## 2019-03-26 ENCOUNTER — Ambulatory Visit (HOSPITAL_COMMUNITY): Payer: Medicare Other

## 2019-03-28 ENCOUNTER — Ambulatory Visit (HOSPITAL_COMMUNITY): Payer: Medicare Other

## 2019-03-30 ENCOUNTER — Other Ambulatory Visit (HOSPITAL_COMMUNITY): Payer: Self-pay | Admitting: Cardiology

## 2019-03-30 DIAGNOSIS — I5022 Chronic systolic (congestive) heart failure: Secondary | ICD-10-CM

## 2019-03-31 ENCOUNTER — Ambulatory Visit (INDEPENDENT_AMBULATORY_CARE_PROVIDER_SITE_OTHER): Payer: Medicare Other

## 2019-03-31 DIAGNOSIS — Z95810 Presence of automatic (implantable) cardiac defibrillator: Secondary | ICD-10-CM

## 2019-03-31 DIAGNOSIS — I5022 Chronic systolic (congestive) heart failure: Secondary | ICD-10-CM | POA: Diagnosis not present

## 2019-04-01 NOTE — Progress Notes (Signed)
EPIC Encounter for ICM Monitoring  Patient Name: Miguel Roach is a 78 y.o. male Date: 04/01/2019 Primary Care Physican: Deland Pretty, MD Primary Cardiologist:[facility] Electrophysiologist: Lovena Le Nephrologist: Narda Amber Kidney - Posey Pronto 03/21/2019 Weight: 173lbs   Transmission reviewed and results sent via mychart.   Optivol thoracic impedancenormal.   Prescribed:Furosemide 40 mgtake2 tablets (80 mgtotal)twice a day alternating with 80 mg AM and 40 mg PM.  Labs: 03/21/2019 Creatinine 3.94, BUN 44, Potassium 3.9, Sodium 142, GFR 14-16 12/19/2018 Creatinine 3.71, BUN 50, Potassium 4.0, Sodium 140, GFR 15-17  12/12/2018 Creatinine 4.16, BUN 60, Potassium 4.2, Sodium 139, GFR 13-15 A complete set of results can be found in Results Review.  Recommendations: Call if experiencing fluid symptoms and follow low salt diet.  Follow-up plan: ICM clinic phone appointment on7/20/2020.  Copy of3/19/2020ICM check sent to Dr.Taylor.   3 month ICM trend: 03/31/2019    1 Year ICM trend:       Rosalene Billings, RN 04/01/2019 9:30 AM

## 2019-04-02 ENCOUNTER — Ambulatory Visit (HOSPITAL_COMMUNITY): Payer: Medicare Other

## 2019-04-02 ENCOUNTER — Other Ambulatory Visit (HOSPITAL_COMMUNITY): Payer: Self-pay | Admitting: *Deleted

## 2019-04-02 ENCOUNTER — Ambulatory Visit (HOSPITAL_COMMUNITY)
Admission: RE | Admit: 2019-04-02 | Discharge: 2019-04-02 | Disposition: A | Discharge status: Home / Self Care | Payer: Medicare Other | Source: Ambulatory Visit | Attending: Cardiology | Admitting: Cardiology

## 2019-04-02 ENCOUNTER — Other Ambulatory Visit (HOSPITAL_COMMUNITY): Payer: Self-pay | Admitting: Cardiology

## 2019-04-02 ENCOUNTER — Other Ambulatory Visit: Payer: Self-pay

## 2019-04-02 DIAGNOSIS — I493 Ventricular premature depolarization: Secondary | ICD-10-CM

## 2019-04-04 ENCOUNTER — Ambulatory Visit (HOSPITAL_COMMUNITY): Payer: Medicare Other

## 2019-04-07 ENCOUNTER — Other Ambulatory Visit (HOSPITAL_COMMUNITY): Payer: Self-pay | Admitting: Cardiology

## 2019-04-07 ENCOUNTER — Telehealth: Payer: Self-pay

## 2019-04-07 NOTE — Telephone Encounter (Signed)
Returned patient call as requested by voice mail message.  He is requesting Hydralazine refill.  He takes Hydralazine 25 mg 0.5 tablet (12.5 mg total) twice a day.  He said it was originally prescribed by hospitalist and he only has a few tabs a day.  Advised will route phone note to HF clinic refill department for follow up.  He said that will be fine.

## 2019-04-09 ENCOUNTER — Ambulatory Visit (HOSPITAL_COMMUNITY): Payer: Medicare Other

## 2019-04-11 ENCOUNTER — Ambulatory Visit (HOSPITAL_COMMUNITY): Payer: Medicare Other

## 2019-04-11 ENCOUNTER — Telehealth (HOSPITAL_COMMUNITY): Payer: Self-pay

## 2019-04-11 NOTE — Telephone Encounter (Signed)
Pt insurance is active and benefits verified through [facility]. Co-pay $20.00, DED $0.00/$0.00 met, out of pocket $4,000.00/$1,253.26 met, co-insurance 0%. No pre-authorization required. Passport, 04/11/2019 @ 10:57AM, QPR#[id number]

## 2019-04-14 ENCOUNTER — Other Ambulatory Visit (HOSPITAL_COMMUNITY): Payer: Self-pay | Admitting: Cardiology

## 2019-04-16 ENCOUNTER — Ambulatory Visit (HOSPITAL_COMMUNITY): Payer: Medicare Other

## 2019-04-23 ENCOUNTER — Ambulatory Visit (HOSPITAL_COMMUNITY): Payer: Medicare Other

## 2019-04-24 ENCOUNTER — Telehealth (HOSPITAL_COMMUNITY): Payer: Self-pay

## 2019-04-25 ENCOUNTER — Ambulatory Visit (HOSPITAL_COMMUNITY): Payer: Medicare Other

## 2019-04-25 ENCOUNTER — Encounter (HOSPITAL_COMMUNITY): Payer: Self-pay

## 2019-04-25 NOTE — Progress Notes (Signed)
Pt was contacted to schedule for cardiac rehab orientation. A message was left and our call has not been returned. Pt will be mailed a letter today (7/10) with instructions on how to contact us. If pt has not contacted [place] by 7/24, their referral will be closed.

## 2019-04-28 NOTE — Addendum Note (Signed)
Encounter addended by: Micki Riley, RN on: 04/28/2019 11:12 AM  Actions taken: Imaging Exam ended

## 2019-04-30 ENCOUNTER — Telehealth (HOSPITAL_COMMUNITY): Payer: Self-pay | Admitting: Internal Medicine

## 2019-04-30 ENCOUNTER — Ambulatory Visit (HOSPITAL_COMMUNITY): Payer: Medicare Other

## 2019-05-02 ENCOUNTER — Ambulatory Visit (HOSPITAL_COMMUNITY): Payer: Medicare Other

## 2019-05-05 ENCOUNTER — Telehealth (HOSPITAL_COMMUNITY): Payer: Self-pay

## 2019-05-05 ENCOUNTER — Ambulatory Visit (INDEPENDENT_AMBULATORY_CARE_PROVIDER_SITE_OTHER): Payer: Medicare Other

## 2019-05-05 DIAGNOSIS — Z95810 Presence of automatic (implantable) cardiac defibrillator: Secondary | ICD-10-CM | POA: Diagnosis not present

## 2019-05-05 DIAGNOSIS — I5022 Chronic systolic (congestive) heart failure: Secondary | ICD-10-CM

## 2019-05-05 NOTE — Telephone Encounter (Signed)
Pt aware of results and appreciative of call.

## 2019-05-05 NOTE — Telephone Encounter (Signed)
-----   Message from Larey Dresser, MD sent at 05/02/2019  4:51 PM EDT ----- Occasional PVCs, nothing worrisome.

## 2019-05-07 ENCOUNTER — Ambulatory Visit (HOSPITAL_COMMUNITY): Payer: Medicare Other

## 2019-05-09 ENCOUNTER — Telehealth: Payer: Self-pay

## 2019-05-09 ENCOUNTER — Ambulatory Visit (HOSPITAL_COMMUNITY): Payer: Medicare Other

## 2019-05-09 NOTE — Progress Notes (Signed)
EPIC Encounter for ICM Monitoring  Patient Name: Miguel Roach is a 78 y.o. male Date: 05/09/2019 Primary Care Physican: Deland Pretty, MD Primary Cardiologist:[facility] Electrophysiologist: Lovena Le Nephrologist: Narda Amber Kidney - Posey Pronto 03/21/2019 Weight: 173lbs   Attempted call to patient and unable to reach.  Left detailed message per DPR regarding transmission. Transmission reviewed.   Optivol thoracic impedancenormal.   Prescribed:Furosemide 40 mgtake2 tablets (80 mgtotal)twice a day alternating with 80 mg AM and 40 mg PM.  Labs: 03/21/2019 Creatinine 3.94, BUN 44, Potassium 3.9, Sodium 142, GFR 14-16 12/19/2018 Creatinine 3.71, BUN 50, Potassium 4.0, Sodium 140, GFR 15-17  12/12/2018 Creatinine 4.16, BUN 60, Potassium 4.2, Sodium 139, GFR 13-15 A complete set of results can be found in Results Review.  Recommendations: Left voice mail with ICM number and encouraged to call if experiencing any fluid symptoms.  Follow-up plan: ICM clinic phone appointment on8/24/2020.  3 month ICM trend: 05/05/2019    1 Year ICM trend:       Rosalene Billings, RN 05/09/2019 8:16 AM

## 2019-05-09 NOTE — Telephone Encounter (Signed)
Remote ICM transmission received.  Attempted call to patient regarding ICM remote transmission and left detailed message, per DPR, with next ICM remote transmission date of 06/09/2019.  Advised to return call for any fluid symptoms or questions.    

## 2019-05-14 ENCOUNTER — Ambulatory Visit (HOSPITAL_COMMUNITY): Payer: Medicare Other

## 2019-05-16 ENCOUNTER — Ambulatory Visit (HOSPITAL_COMMUNITY): Payer: Medicare Other

## 2019-05-21 ENCOUNTER — Ambulatory Visit (HOSPITAL_COMMUNITY): Payer: Medicare Other

## 2019-05-23 ENCOUNTER — Ambulatory Visit (HOSPITAL_COMMUNITY): Payer: Medicare Other

## 2019-05-26 ENCOUNTER — Ambulatory Visit (INDEPENDENT_AMBULATORY_CARE_PROVIDER_SITE_OTHER): Payer: Medicare Other | Admitting: *Deleted

## 2019-05-26 DIAGNOSIS — I428 Other cardiomyopathies: Secondary | ICD-10-CM | POA: Diagnosis not present

## 2019-05-26 LAB — CUP PACEART REMOTE DEVICE CHECK
Battery Remaining Longevity: 111 mo
Battery Voltage: 3.01 V
Brady Statistic RV Percent Paced: 0.12 %
Date Time Interrogation Session: 20200810052504
HighPow Impedance: 68 Ohm
Implantable Lead Implant Date: 20170208
Implantable Lead Location: 753860
Implantable Lead Model: 6935
Implantable Pulse Generator Implant Date: 20170208
Lead Channel Impedance Value: 342 Ohm
Lead Channel Impedance Value: 399 Ohm
Lead Channel Pacing Threshold Amplitude: 1 V
Lead Channel Pacing Threshold Pulse Width: 0.4 ms
Lead Channel Sensing Intrinsic Amplitude: 8.625 mV
Lead Channel Sensing Intrinsic Amplitude: 8.625 mV
Lead Channel Setting Pacing Amplitude: 2 V
Lead Channel Setting Pacing Pulse Width: 0.4 ms
Lead Channel Setting Sensing Sensitivity: 0.3 mV

## 2019-05-28 ENCOUNTER — Ambulatory Visit (HOSPITAL_COMMUNITY): Payer: Medicare Other

## 2019-05-30 ENCOUNTER — Ambulatory Visit (HOSPITAL_COMMUNITY): Payer: Medicare Other

## 2019-06-04 ENCOUNTER — Encounter: Payer: Self-pay | Admitting: Cardiology

## 2019-06-04 ENCOUNTER — Ambulatory Visit (HOSPITAL_COMMUNITY): Payer: Medicare Other

## 2019-06-04 NOTE — Progress Notes (Signed)
Remote ICD transmission.   

## 2019-06-09 ENCOUNTER — Ambulatory Visit (INDEPENDENT_AMBULATORY_CARE_PROVIDER_SITE_OTHER): Payer: Medicare Other

## 2019-06-09 DIAGNOSIS — I5022 Chronic systolic (congestive) heart failure: Secondary | ICD-10-CM

## 2019-06-09 DIAGNOSIS — Z95810 Presence of automatic (implantable) cardiac defibrillator: Secondary | ICD-10-CM

## 2019-06-11 ENCOUNTER — Telehealth: Payer: Self-pay

## 2019-06-11 NOTE — Progress Notes (Signed)
EPIC Encounter for ICM Monitoring  Patient Name: Miguel Roach is a 78 y.o. male Date: 06/11/2019 Primary Care Physican: Deland Pretty, MD Primary Cardiologist:[facility] Electrophysiologist: Lovena Le Nephrologist: Narda Amber Kidney - Posey Pronto 03/21/2019 Weight:173lbs   Attempted call to patient and unable to reach.  Left detailed message per DPR regarding transmission. Transmission reviewed.   Optivol thoracic impedancenormal.   Prescribed:Furosemide 40 mgtake2 tablets (80 mgtotal)twice a day alternating with 80 mg AM and 40 mg PM.  Labs: 03/21/2019 Creatinine3.94, BUN44, Potassium3.9, Sodium142, [id number] 12/19/2018 Creatinine3.71, BUN50, Potassium4.0, Sodium140, HTD42-87 12/12/2018 Creatinine 4.16, BUN 60, Potassium 4.2, Sodium 139, GFR 13-15 A complete set of results can be found in Results Review.  Recommendations:Left voice mail with ICM number and encouraged to call if experiencing any fluid symptoms.  Follow-up plan: ICM clinic phone appointment on10/03/2019.    Copy of ICM check sent to Dr. Lovena Le.   3 month ICM trend: 06/09/2019    1 Year ICM trend:       Rosalene Billings, RN 06/11/2019 11:01 AM

## 2019-06-11 NOTE — Telephone Encounter (Signed)
Remote ICM transmission received.  Attempted call to patient regarding ICM remote transmission and left detailed message, per DPR.

## 2019-06-13 ENCOUNTER — Ambulatory Visit (HOSPITAL_COMMUNITY)
Admission: RE | Admit: 2019-06-13 | Discharge: 2019-06-13 | Disposition: A | Discharge status: Home / Self Care | Payer: Medicare Other | Source: Ambulatory Visit | Attending: Cardiology | Admitting: Cardiology

## 2019-06-13 ENCOUNTER — Other Ambulatory Visit: Payer: Self-pay

## 2019-06-13 ENCOUNTER — Other Ambulatory Visit (HOSPITAL_COMMUNITY): Payer: Self-pay

## 2019-06-13 DIAGNOSIS — I5022 Chronic systolic (congestive) heart failure: Secondary | ICD-10-CM | POA: Diagnosis not present

## 2019-06-13 DIAGNOSIS — I351 Nonrheumatic aortic (valve) insufficiency: Secondary | ICD-10-CM | POA: Diagnosis not present

## 2019-06-16 ENCOUNTER — Encounter (HOSPITAL_COMMUNITY): Payer: Self-pay | Admitting: Cardiology

## 2019-06-16 ENCOUNTER — Other Ambulatory Visit (HOSPITAL_COMMUNITY): Payer: Self-pay

## 2019-06-16 ENCOUNTER — Other Ambulatory Visit: Payer: Self-pay

## 2019-06-16 ENCOUNTER — Ambulatory Visit (HOSPITAL_COMMUNITY)
Admission: RE | Admit: 2019-06-16 | Discharge: 2019-06-16 | Disposition: A | Discharge status: Home / Self Care | Payer: Medicare Other | Source: Ambulatory Visit | Attending: Cardiology | Admitting: Cardiology

## 2019-06-16 VITALS — BP 100/66 | HR 59 | Wt 175.0 lb

## 2019-06-16 DIAGNOSIS — I428 Other cardiomyopathies: Secondary | ICD-10-CM | POA: Insufficient documentation

## 2019-06-16 DIAGNOSIS — I493 Ventricular premature depolarization: Secondary | ICD-10-CM

## 2019-06-16 DIAGNOSIS — M109 Gout, unspecified: Secondary | ICD-10-CM | POA: Insufficient documentation

## 2019-06-16 DIAGNOSIS — Z8249 Family history of ischemic heart disease and other diseases of the circulatory system: Secondary | ICD-10-CM | POA: Diagnosis not present

## 2019-06-16 DIAGNOSIS — Z79899 Other long term (current) drug therapy: Secondary | ICD-10-CM | POA: Diagnosis not present

## 2019-06-16 DIAGNOSIS — I2510 Atherosclerotic heart disease of native coronary artery without angina pectoris: Secondary | ICD-10-CM | POA: Diagnosis not present

## 2019-06-16 DIAGNOSIS — I480 Paroxysmal atrial fibrillation: Secondary | ICD-10-CM | POA: Insufficient documentation

## 2019-06-16 DIAGNOSIS — G4733 Obstructive sleep apnea (adult) (pediatric): Secondary | ICD-10-CM | POA: Insufficient documentation

## 2019-06-16 DIAGNOSIS — I5022 Chronic systolic (congestive) heart failure: Secondary | ICD-10-CM | POA: Diagnosis present

## 2019-06-16 DIAGNOSIS — N184 Chronic kidney disease, stage 4 (severe): Secondary | ICD-10-CM | POA: Insufficient documentation

## 2019-06-16 LAB — COMPREHENSIVE METABOLIC PANEL
ALT: 15 U/L (ref 0–44)
AST: 15 U/L (ref 15–41)
Albumin: 3.4 g/dL — ABNORMAL LOW (ref 3.5–5.0)
Alkaline Phosphatase: 65 U/L (ref 38–126)
Anion gap: 8 (ref 5–15)
BUN: 46 mg/dL — ABNORMAL HIGH (ref 8–23)
CO2: 27 mmol/L (ref 22–32)
Calcium: 9 mg/dL (ref 8.9–10.3)
Chloride: 106 mmol/L (ref 98–111)
Creatinine, Ser: 4.08 mg/dL — ABNORMAL HIGH (ref 0.61–1.24)
GFR calc Af Amer: 15 mL/min — ABNORMAL LOW (ref >60)
GFR calc non Af Amer: 13 mL/min — ABNORMAL LOW (ref >60)
Glucose, Bld: 95 mg/dL (ref 70–99)
Potassium: 3.7 mmol/L (ref 3.5–5.1)
Sodium: 141 mmol/L (ref 135–145)
Total Bilirubin: 0.6 mg/dL (ref 0.3–1.2)
Total Protein: 6.4 g/dL — ABNORMAL LOW (ref 6.5–8.1)

## 2019-06-16 LAB — TSH: TSH: 1.962 u[IU]/mL (ref 0.350–4.500)

## 2019-06-16 MED ORDER — AMIODARONE HCL 100 MG PO TABS: 100.0000 mg | ORAL_TABLET | Freq: Every day | ORAL | 6 refills | Status: DC

## 2019-06-16 NOTE — Progress Notes (Signed)
P  Advanced Heart Failure Clinic Note   Patient ID: RAMEEN QUINNEY, male   DOB: [birth year], 78 y.o.   MRN: [id number] PCP: Dr. Shelia Media Cardiology: Dr. Aundra Dubin   78 y.o. with history of chronic systolic CHF/nonischemic cardiomyopathy and CKD presents for followup of CHF. He actually had an echo back in 2010 with EF 40-45% at the time.  He says that this really was not followed up upon by a cardiologist and that he did well for a number of years after that. In 6/16, he was admitted with acute systolic CHF.  Echo showed EF down to 20-25%.  Cardiac cath showed nonobstructive CAD.  His Lasix has been gradually increased.  This has brought his weight down and improved his breathing, but creatinine has risen. He had been on olmesartan but this was stopped because he thought it was causing cough.  He was then put on losartan, but this was stopped with rise in creatinine. Also intolerant to Bidil with orthostatic symptoms.  He was started on digoxin.  Echo in 12/16 showed persistently low EF, 15-20%.  Medtronic ICD was placed in 2/17.  Repeat echo 1/18 showed EF 25% with diffuse hypokinesis.   In 3/18, he had an episode of about 10 minutes atrial fibrillation noted on his ICD.  He also had 1 episode of VT terminated by an appropriate discharge.  He had push-mowed his grass and was very fatigued when he had the shock.  Since then, he has had no further arrhythmias.   Echo in 6/19 showed EF 25-30% with mild LV dilation and mild-moderate AI, normal RV size with mildly decreased systolic function, IVC normal.   Admitted 1/27 - 11/17/2018 with hypotension and found to have UTI/sepsis. Treated on ABX and IVF. Eventually transitioned back to po diuretics with HF team following. 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 Noted to have increased NSVT and PVCs, started on amiodarone.  Zio patch in 6/20 showed occasional PVCs, burden low.   Echo in 8/20 showed EF 35%, severe LV dilation, normal RV size and systolic function,  mild-moderate AI.     Patient is feeling good at this point.  He is walking 1 mile/day, no significant exertional dyspnea.  No orthopnea/PND.  No chest pain.  BP is stable.   ECG (personally reviewed): NSR, LBBB 128 msec   Labs (6/16): BNP 1954, TSH normal Labs (7/16): K 4.1, creatinine 1.72 Labs (8/16): creatinine 2.3 Labs (9/16): digoxin 0.3, K 4, creatinine 2.14, SPEP negative, UPEP negative Labs (10/16): K 4.6, creatinine 1.62 Labs (11/16): K 4.4, creatinine 2.33, digoxin 0.5 Labs (12/16): creatinine 2.1 (nephrology) Labs (1/17): digoxin 0.6, uric acid 10.4 Labs (2/17): K 4, creatinine 2.13, hgb 11.6 Labs (5/17): K 3.8, creatinine 2.22, digoxin 0.7 Labs (9/17): K 4.4, creatinine 2.88, LFTs normal, digoxin 1.0, hgb 11.8, LDL 88, HDL 50 Labs (1/18): digoxin 0.5 Labs (3/18): K 4.4, creatinine 3.17 Labs (4/18): K 4.3, creatinine 3.39, digoxin 0.6 Labs (10/18): K 4.5, creatinine 3.65, digoxin 0.4 Labs (11/18): K 4, creatinine 3.24, digoxin 0.6 Labs (12/18): digoxin < 0.2 Labs (1/19): K 3.8, creatinine 3.15 Labs (2/19): K 4.0, creatinine 3.23 Labs (3/19): digoxin 0.4, K 3.8, creatinine 3.19 Labs (6/19): digoxin 0.3, K 3.9, creatinine 3.12 Labs (9/19): K 3.9, creatinine 3.58, digoxin 0.3, LDL 91 Labs (3/20): K 4, creatinine 3.71 Labs (6/20): K 3.9, creatinine 3.94, LFTs normal, TSH normal  PMH: 1. CKD: Stage IV.  Follows with Dr Posey Pronto.  2. Gout 3. Chronic systolic CHF: Nonischemic cardiomyopathy.  EF  40-45% in 2010.  Echo (6/16) with EF 20-25%, severe LV dilation, mild MR, mild AI, moderate LAE.  LHC/RHC (6/16) with nonobstructive CAD; mean RA 2, RV 48/1, PA 47/18, mean PCWP 12, CI 3.2.  CPX (10/16) with peak VO2 13.1 (50% predicted), VE/VCO2 slope 39.3, RER 1.22 => moderate to severe functional limitation.  Echo (12/16) with EF 15-20%, severe LV dilation, diffuse hypokinesis, moderate AI, mild MR, normal RV size and systolic function. Medtronic ICD.  - CPX (11/17): Submaximal with  RER 0.95, VE/VCO2 slope 27, peak VO2 18.2.  Suspect no more than mild HF limitation.  - Echo (1/18): EF 25%, diffuse hypokinesis, normal RV size and systolic function, mild AI.  - Echo (6/19): EF 25-30% with mild LV dilation and mild-moderate AI, normal RV size with mildly decreased systolic function, IVC normal.  - Echo (8/20): EF 35%, severe LV dilation, normal RV size and systolic function, mild-moderate AI.  4. ACEI cough.  5. Pleural effusion: Thoracentesis on right in 8/16.  It was a transudate, likely due to CHF.  6. OSA: To start Bipap.  7. Esophageal duplication cyst.  8. High resolution CT chest 12/17: No evidence for interstitial lung disease.  9. Atrial fibrillation: Paroxysmal, 1 10 minute episode in 3/18.  10. VT: ICD discharge 3/18.  11. 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  - Zio patch in 6/20: occasional PVCs, low burden overall.   SH: Married, retired Pharmacist, hospital, never smoked, no ETOH.   FH: Mother with "weak heart." Brother with sickle cell anemia.   ROS: All systems reviewed and negative except as per HPI.   Current Outpatient Medications  Medication Sig Dispense Refill   allopurinol (ZYLOPRIM) 100 MG tablet Take 200 mg by mouth daily.     amiodarone (PACERONE) 100 MG tablet Take 1 tablet (100 mg total) by mouth daily. 30 tablet 6   carvedilol (COREG) 6.25 MG tablet TAKE 1 TABLET (6.25 MG TOTAL) BY MOUTH 2 (TWO) TIMES DAILY WITH A MEAL. 180 tablet 3   Cholecalciferol (VITAMIN D3) 5000 units TABS Take 5,000 Units by mouth every morning.      colchicine 0.6 MG tablet Take 0.6 mg by mouth daily as needed (gout flare up).     ferric citrate (AURYXIA) 1 GM 210 MG(Fe) tablet Take 210 mg by mouth daily.     furosemide (LASIX) 40 MG tablet TAKE 2 TABS BY MOUTH EVERY MORNING AND 1 TAB EVERY EVENING ALTERNATING WITH 2 TABS TWICE A DAY. 315 tablet 3   hydrALAZINE (APRESOLINE) 25 MG tablet TAKE 0.5 TABLETS (12.5 MG TOTAL) BY MOUTH EVERY 8 (EIGHT) HOURS FOR 30 DAYS. 45 tablet 3    isosorbide mononitrate (IMDUR) 30 MG 24 hr tablet TAKE 1 TABLET (30 MG TOTAL) BY MOUTH DAILY. 30 tablet 6   No current facility-administered medications for this encounter.    Filed Weights   06/16/19 1101  Weight: 79.4 kg (175 lb)   BP 100/66    Pulse (!) 59    Wt 79.4 kg (175 lb)    SpO2 98%    BMI 24.41 kg/m   General: NAD Neck: No JVD, no thyromegaly or thyroid nodule.  Lungs: Clear to auscultation bilaterally with normal respiratory effort. CV: Nondisplaced PMI.  Heart regular S1/S2, no S3/S4, no murmur.  No peripheral edema.  No carotid bruit.  Normal pedal pulses.  Abdomen: Soft, nontender, no hepatosplenomegaly, no distention.  Skin: Intact without lesions or rashes.  Neurologic: Alert and oriented x 3.  Psych: Normal affect. Extremities: No clubbing or  cyanosis.  HEENT: Normal.   Assessment/Plan: 1. Chronic systolic CHF: Nonischemic cardiomyopathy.  CO preserved on RHC in 6/16, but 10/16 CPX showed moderate to severe functional impairment (seems worse than his symptoms would suggest).  However, repeat CPX in 11/17, though submaximal, suggested no more than mild HF limitation. No significant CAD with coronary angiography in 6/16.  No hx heavy ETOH or drug abuse. No marked HTN.  Negative SPEP/UPEP.  Possible prior myocarditis.  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  He now has Medtronic ICD.  Echo in 6/19 showed EF 25-30% (stable) with mild-moderate AI and mildly decreased RV systolic function.  Echo in 8/20 showed EF mildly higher at 35%, severe LV dilation, normal RV, mild-moderate AI.  NYHA class II.  He does not appear volume overloaded by exam.  - Continue alternating Lasix 80 mg bid with Lasix 80 qam/40 qpm. BMET today - Off spironolactone with elevated creatinine.    - Continue Coreg 6.25 mg bid. - Hold off on ACEI/ARB/Entresto at this time with elevated creatinine.  - Continue hydralazine 12.5 mg tid and Imdur 30 mg daily, BP has been too  low to increase.   - He is off digoxin with rising creatinine.  2. CKD: Stage IV.  BMET today, creatinine has been > 3 recently.  He follows with Dr Posey Pronto every 3 months and sees him again in 9/20.  3. OSA: Bipap. 4. Gout: Gout controlled on allopurinol. 5. VT/PVCs: Appropriate ICD discharge in 3/18, no recurrence. He is now on amiodarone due to very frequent PVCs. Zio patch in 6/20 showed low burden of PVCs.  - Continue Coreg - Continue amiodarone 100 mg daily. Check LFTs and TSH today, will need regular eye exams.  6. Atrial fibrillation: Paroxysmal.  Very short episodes only noted by device interrogation, none recently.  He has had no prolonged or symptomatic atrial fibrillation (data for anticoagulating short runs of atrial fibrillation is not strong), so will hold off on anticoagulation.  - If he develops prolonged or symptomatic atrial fibrillation, would anticoagulate.   Followup in 3 months.   [name] 06/16/2019

## 2019-06-16 NOTE — Patient Instructions (Signed)
Labs done today. We will contact you only if your labs are abnormal.  No medication changes were made today. Please continue all current medications as prescribed.  Your physician recommends that you schedule a follow-up appointment in: 3 months with Dr.Mclean  At the [facility], you and your health needs are our priority. As part of our continuing mission to provide you with exceptional heart care, we have created designated Provider Care Teams. These Care Teams include your primary Cardiologist (physician) and Advanced Practice Providers (APPs- Physician Assistants and Nurse Practitioners) who all work together to provide you with the care you need, when you need it.   You may see any of the following providers on your designated Care Team at your next follow up: Marland Kitchen Dr Glori Bickers . Dr [name] . Darrick Grinder, NP   Please be sure to bring in all your medications bottles to every appointment.

## 2019-06-21 ENCOUNTER — Other Ambulatory Visit (HOSPITAL_COMMUNITY): Payer: Self-pay | Admitting: Cardiology

## 2019-06-24 ENCOUNTER — Other Ambulatory Visit (HOSPITAL_COMMUNITY): Payer: Self-pay | Admitting: Internal Medicine

## 2019-06-25 ENCOUNTER — Other Ambulatory Visit (HOSPITAL_COMMUNITY): Payer: Self-pay | Admitting: Cardiology

## 2019-07-22 ENCOUNTER — Ambulatory Visit (INDEPENDENT_AMBULATORY_CARE_PROVIDER_SITE_OTHER): Payer: Medicare Other

## 2019-07-22 DIAGNOSIS — Z95810 Presence of automatic (implantable) cardiac defibrillator: Secondary | ICD-10-CM | POA: Diagnosis not present

## 2019-07-22 DIAGNOSIS — I5022 Chronic systolic (congestive) heart failure: Secondary | ICD-10-CM | POA: Diagnosis not present

## 2019-07-23 ENCOUNTER — Other Ambulatory Visit (HOSPITAL_COMMUNITY): Payer: Self-pay | Admitting: Cardiology

## 2019-07-24 ENCOUNTER — Other Ambulatory Visit: Payer: Self-pay

## 2019-07-25 NOTE — Progress Notes (Signed)
EPIC Encounter for ICM Monitoring  Patient Name: Miguel Roach is a 78 y.o. male Date: 07/25/2019 Primary Care Physican: Deland Pretty, MD Primary Cardiologist:[place] Electrophysiologist: Lovena Le Nephrologist: Ong 03/21/2019 Weight:173lbs   Spoke with patient and reports feeling well at this time.  Denies fluid symptoms.     Optivol thoracic impedancenormal.   Prescribed:Furosemide 40 mgtake2 tablets (80 mgtotal)twice a day alternating with 80 mg AM and 40 mg PM.  Labs: 06/16/2019 Creatinine 4.08, BUN 46, Potassium 3.7, Sodium 141, GFR 13-15 03/21/2019 Creatinine3.94, BUN44, Potassium3.9, Sodium142, [id number] 12/19/2018 Creatinine3.71, BUN50, Potassium4.0, Sodium140, [id number] 12/12/2018 Creatinine 4.16, BUN 60, Potassium 4.2, Sodium 139, GFR 13-15 A complete set of results can be found in Results Review.  Recommendations: No changes and encouraged to call if experiencing any fluid symptoms.  Follow-up plan: ICM clinic phone appointment on 08/26/2019.   91 day device clinic remote transmission 08/25/2019.  Office appt 09/16/2019 with Dr. Aundra Dubin.    Copy of ICM check sent to Dr. Lovena Le.   3 month ICM trend: 07/22/2019    1 Year ICM trend:       Rosalene Billings, RN 07/25/2019 1:34 PM

## 2019-08-25 ENCOUNTER — Ambulatory Visit (INDEPENDENT_AMBULATORY_CARE_PROVIDER_SITE_OTHER): Payer: Medicare Other | Admitting: *Deleted

## 2019-08-25 DIAGNOSIS — I5022 Chronic systolic (congestive) heart failure: Secondary | ICD-10-CM | POA: Diagnosis not present

## 2019-08-25 DIAGNOSIS — I420 Dilated cardiomyopathy: Secondary | ICD-10-CM

## 2019-08-26 ENCOUNTER — Ambulatory Visit (INDEPENDENT_AMBULATORY_CARE_PROVIDER_SITE_OTHER): Payer: Medicare Other

## 2019-08-26 DIAGNOSIS — Z95810 Presence of automatic (implantable) cardiac defibrillator: Secondary | ICD-10-CM | POA: Diagnosis not present

## 2019-08-26 DIAGNOSIS — I5022 Chronic systolic (congestive) heart failure: Secondary | ICD-10-CM | POA: Diagnosis not present

## 2019-08-26 LAB — CUP PACEART REMOTE DEVICE CHECK
Battery Remaining Longevity: 107 mo
Battery Voltage: 3.01 V
Brady Statistic RV Percent Paced: 0.5 %
Date Time Interrogation Session: 20201109072722
HighPow Impedance: 71 Ohm
Implantable Lead Implant Date: 20170208
Implantable Lead Location: 753860
Implantable Lead Model: 6935
Implantable Pulse Generator Implant Date: 20170208
Lead Channel Impedance Value: 361 Ohm
Lead Channel Impedance Value: 418 Ohm
Lead Channel Pacing Threshold Amplitude: 0.875 V
Lead Channel Pacing Threshold Pulse Width: 0.4 ms
Lead Channel Sensing Intrinsic Amplitude: 11.625 mV
Lead Channel Sensing Intrinsic Amplitude: 11.625 mV
Lead Channel Setting Pacing Amplitude: 2 V
Lead Channel Setting Pacing Pulse Width: 0.4 ms
Lead Channel Setting Sensing Sensitivity: 0.3 mV

## 2019-08-29 ENCOUNTER — Telehealth: Payer: Self-pay

## 2019-08-29 NOTE — Telephone Encounter (Signed)
Remote ICM transmission received.  Attempted call to patient regarding ICM remote transmission and left detailed message per DPR.  Advised to return call for any fluid symptoms or questions.  

## 2019-08-29 NOTE — Progress Notes (Signed)
EPIC Encounter for ICM Monitoring  Patient Name: Miguel Roach is a 78 y.o. male Date: 08/29/2019 Primary Care Physican: Deland Pretty, MD Primary Cardiologist:[place] Electrophysiologist: Lovena Le Nephrologist: [place] Last Weight:173lbs   Attempted call to patient and unable to reach.  Left detailed message per DPR regarding transmission. Transmission reviewed.     Optivol thoracic impedancenormal.   Prescribed:Furosemide 40 mgtake2 tablets (80 mgtotal)twice a day alternating with 80 mg AM and 40 mg PM.  Labs: 06/16/2019 Creatinine 4.08, BUN 46, Potassium 3.7, Sodium 141, GFR 13-15 03/21/2019 Creatinine3.94, BUN44, Potassium3.9, Sodium142, [id number] 12/19/2018 Creatinine3.71, BUN50, Potassium4.0, Sodium140, [id number] 12/12/2018 Creatinine 4.16, BUN 60, Potassium 4.2, Sodium 139, GFR 13-15 A complete set of results can be found in Results Review.  Recommendations:  Left voice mail with ICM number and encouraged to call if experiencing any fluid symptoms.  Follow-up plan: ICM clinic phone appointment on 10/26/2018.   91 day device clinic remote transmission 09/16/2019.  Office appt 09/26/2019 with Dr. Lovena Le.    Copy of ICM check sent to Dr. Lovena Le.   3 month ICM trend: 08/25/2019    1 Year ICM trend:       Rosalene Billings, RN 08/29/2019 1:05 PM

## 2019-09-16 ENCOUNTER — Other Ambulatory Visit: Payer: Self-pay

## 2019-09-16 ENCOUNTER — Ambulatory Visit (HOSPITAL_COMMUNITY)
Admission: RE | Admit: 2019-09-16 | Discharge: 2019-09-16 | Disposition: A | Discharge status: Home / Self Care | Payer: Medicare Other | Source: Ambulatory Visit | Attending: Cardiology | Admitting: Cardiology

## 2019-09-16 DIAGNOSIS — I5022 Chronic systolic (congestive) heart failure: Secondary | ICD-10-CM

## 2019-09-16 NOTE — Progress Notes (Signed)
Orders per Dr Aundra Dubin:  1. Cmet, tsh  2. F/u 3 months   Attempted to call pt to sch appt, left VM for him to call us back.

## 2019-09-17 ENCOUNTER — Encounter (HOSPITAL_COMMUNITY): Payer: Self-pay | Admitting: *Deleted

## 2019-09-17 NOTE — Addendum Note (Signed)
Encounter addended by: Scarlette Calico, RN on: 09/17/2019 4:06 PM  Actions taken: Visit diagnoses modified, Diagnosis association updated, Order list changed, Clinical Note Signed

## 2019-09-17 NOTE — Progress Notes (Signed)
Heart Failure TeleHealth Note  Due to national recommendations of social distancing due to [place] 19, Audio/video telehealth visit is felt to be most appropriate for this patient at this time.  See MyChart message from today for patient consent regarding telehealth for [facility].  Date:  09/17/2019   ID:  Miguel Roach, DOB 08-15-1941, MRN [id number]  Location: Home  Provider location: [place] [facility] Failure Type of Visit: Established patient   PCP:  Deland Pretty, MD  Cardiologist:  Dr. Aundra Dubin  Chief Complaint: Fatigue   History of Present Illness: Miguel Roach is a 78 y.o. male who presents via audio/video conferencing for a telehealth visit today.     he denies symptoms worrisome for COVID 19.   Patient has a history of chronic systolic CHF/nonischemic cardiomyopathy and CKD. He actually had an echo back in 2010 with EF 40-45% at the time.  He says that this really was not followed up upon by a cardiologist and that he did well for a number of years after that. In 6/16, he was admitted with acute systolic CHF.  Echo showed EF down to 20-25%.  Cardiac cath showed nonobstructive CAD.  His Lasix has been gradually increased.  This has brought his weight down and improved his breathing, but creatinine has risen. He had been on olmesartan but this was stopped because he thought it was causing cough.  He was then put on losartan, but this was stopped with rise in creatinine. Also intolerant to Bidil with orthostatic symptoms.  He was started on digoxin.  Echo in 12/16 showed persistently low EF, 15-20%.  Medtronic ICD was placed in 2/17.  Repeat echo 1/18 showed EF 25% with diffuse hypokinesis.   In 3/18, he had an episode of about 10 minutes atrial fibrillation noted on his ICD.  He also had 1 episode of VT terminated by an appropriate discharge.  He had push-mowed his grass and was very fatigued when he had the shock.  Since then, he has had no further arrhythmias.   Echo  in 6/19 showed EF 25-30% with mild LV dilation and mild-moderate AI, normal RV size with mildly decreased systolic function, IVC normal.   Admitted 1/27 - 11/17/2018 with hypotension and found to have UTI/sepsis. Treated on ABX and IVF. Eventually transitioned back to po diuretics with HF team following. 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 Noted to have increased NSVT and PVCs, started on amiodarone.  Zio patch in 6/20 showed occasional PVCs, burden low.   Echo in 8/20 showed EF 35%, severe LV dilation, normal RV size and systolic function, mild-moderate AI.     Patient has been doing well symptomatically.  He walks for exercise, good energy level.  No significant exertional dyspnea.  No chest pain.  No orthopnea/PND.  No lightheadedness.  PSA was noted to be higher recently, he is going to see a urologist.  Recent remote device check showed stable thoracic impedance (reviewed).   Labs (6/16): BNP 1954, TSH normal Labs (7/16): K 4.1, creatinine 1.72 Labs (8/16): creatinine 2.3 Labs (9/16): digoxin 0.3, K 4, creatinine 2.14, SPEP negative, UPEP negative Labs (10/16): K 4.6, creatinine 1.62 Labs (11/16): K 4.4, creatinine 2.33, digoxin 0.5 Labs (12/16): creatinine 2.1 (nephrology) Labs (1/17): digoxin 0.6, uric acid 10.4 Labs (2/17): K 4, creatinine 2.13, hgb 11.6 Labs (5/17): K 3.8, creatinine 2.22, digoxin 0.7 Labs (9/17): K 4.4, creatinine 2.88, LFTs normal, digoxin 1.0, hgb 11.8, LDL 88, HDL 50 Labs (1/18): digoxin 0.5 Labs (3/18): K  4.4, creatinine 3.17 Labs (4/18): K 4.3, creatinine 3.39, digoxin 0.6 Labs (10/18): K 4.5, creatinine 3.65, digoxin 0.4 Labs (11/18): K 4, creatinine 3.24, digoxin 0.6 Labs (12/18): digoxin < 0.2 Labs (1/19): K 3.8, creatinine 3.15 Labs (2/19): K 4.0, creatinine 3.23 Labs (3/19): digoxin 0.4, K 3.8, creatinine 3.19 Labs (6/19): digoxin 0.3, K 3.9, creatinine 3.12 Labs (9/19): K 3.9, creatinine 3.58, digoxin 0.3, LDL 91 Labs (3/20): K 4, creatinine 3.71 Labs  (6/20): K 3.9, creatinine 3.94, LFTs normal, TSH normal Labs (8/20): K 3.7, creatinine 4.08, LFTs normal, TSH normal  PMH: 1. CKD: Stage IV.  Follows with Dr Posey Pronto.  2. Gout 3. Chronic systolic CHF: Nonischemic cardiomyopathy.  EF 40-45% in 2010.  Echo (6/16) with EF 20-25%, severe LV dilation, mild MR, mild AI, moderate LAE.  LHC/RHC (6/16) with nonobstructive CAD; mean RA 2, RV 48/1, PA 47/18, mean PCWP 12, CI 3.2.  CPX (10/16) with peak VO2 13.1 (50% predicted), VE/VCO2 slope 39.3, RER 1.22 => moderate to severe functional limitation.  Echo (12/16) with EF 15-20%, severe LV dilation, diffuse hypokinesis, moderate AI, mild MR, normal RV size and systolic function. Medtronic ICD.  - CPX (11/17): Submaximal with RER 0.95, VE/VCO2 slope 27, peak VO2 18.2.  Suspect no more than mild HF limitation.  - Echo (1/18): EF 25%, diffuse hypokinesis, normal RV size and systolic function, mild AI.  - Echo (6/19): EF 25-30% with mild LV dilation and mild-moderate AI, normal RV size with mildly decreased systolic function, IVC normal.  - Echo (8/20): EF 35%, severe LV dilation, normal RV size and systolic function, mild-moderate AI.  4. ACEI cough.  5. Pleural effusion: Thoracentesis on right in 8/16.  It was a transudate, likely due to CHF.  6. OSA: To start Bipap.  7. Esophageal duplication cyst.  8. High resolution CT chest 12/17: No evidence for interstitial lung disease.  9. Atrial fibrillation: Paroxysmal, 1 10 minute episode in 3/18.  10. VT: ICD discharge 3/18.  11. 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  - Zio patch in 6/20: occasional PVCs, low burden overall.   SH: Married, retired Pharmacist, hospital, never smoked, no ETOH.   FH: Mother with "weak heart." Brother with sickle cell anemia.   ROS: All systems reviewed and negative except as per HPI.   Current Outpatient Medications  Medication Sig Dispense Refill  . allopurinol (ZYLOPRIM) 100 MG tablet TAKE 2 TABLETS BY MOUTH EVERY DAY 180 tablet 3  . amiodarone  (PACERONE) 100 MG tablet Take 1 tablet (100 mg total) by mouth daily. 30 tablet 6  . carvedilol (COREG) 6.25 MG tablet TAKE 1 TABLET (6.25 MG TOTAL) BY MOUTH 2 (TWO) TIMES DAILY WITH A MEAL. 180 tablet 3  . Cholecalciferol (VITAMIN D3) 5000 units TABS Take 5,000 Units by mouth every morning.     . colchicine 0.6 MG tablet Take 0.6 mg by mouth daily as needed (gout flare up).    . ferric citrate (AURYXIA) 1 GM 210 MG(Fe) tablet Take 210 mg by mouth daily.    . furosemide (LASIX) 40 MG tablet TAKE 2 TABS BY MOUTH EVERY MORNING AND 1 TAB EVERY EVENING ALTERNATING WITH 2 TABS TWICE A DAY. 315 tablet 3  . hydrALAZINE (APRESOLINE) 25 MG tablet TAKE 1/2 TABLET BY MOUTH EVERY 8 (EIGHT) HOURS 135 tablet 1  . isosorbide mononitrate (IMDUR) 30 MG 24 hr tablet TAKE 1 TABLET BY MOUTH EVERY DAY 90 tablet 1   No current facility-administered medications for this encounter.    Exam:  (Video/Tele Health  Call; Exam is subjective and or/visual.) General:  Speaks in full sentences. No resp difficulty. Lungs: Normal respiratory effort with conversation.  Abdomen: Non-distended per patient report Extremities: Pt denies edema. Neuro: Alert & oriented x 3.  Neck: No JVD  Assessment/Plan: 1. Chronic systolic CHF: Nonischemic cardiomyopathy.  CO preserved on RHC in 6/16, but 10/16 CPX showed moderate to severe functional impairment (seems worse than his symptoms would suggest).  However, repeat CPX in 11/17, though submaximal, suggested no more than mild HF limitation. No significant CAD with coronary angiography in 6/16.  No hx heavy ETOH or drug abuse. No marked HTN.  Negative SPEP/UPEP.  Possible prior myocarditis.  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  He now has Medtronic ICD.  Echo in 6/19 showed EF 25-30% (stable) with mild-moderate AI and mildly decreased RV systolic function.  Echo in 8/20 showed EF mildly higher at 35%, severe LV dilation, normal RV, mild-moderate AI.  NYHA  class II.  He does not appear volume overloaded by exam or recent Optivol check.  - Continue alternating Lasix 80 mg bid with Lasix 80 qam/40 qpm. I will arrange for BMET.  - Off spironolactone with elevated creatinine.    - Continue Coreg 6.25 mg bid. - Hold off on ACEI/ARB/Entresto at this time with elevated creatinine.  - Continue hydralazine 12.5 mg tid and Imdur 30 mg daily, BP has been too low historically to increase.   - He is off digoxin with rising creatinine.  2. CKD: Stage IV.  I will arrange for BMET.  Creatinine has been up to 4 recently.  He follows with Dr Posey Pronto every 3 months. 3. OSA: Bipap. 4. Gout: Gout controlled on allopurinol. 5. VT/PVCs: Appropriate ICD discharge in 3/18, no recurrence. He is now on amiodarone due to very frequent PVCs. Zio patch in 6/20 showed low burden of PVCs.  - Continue Coreg - Continue amiodarone 100 mg daily. Check LFTs and TSH today, will need regular eye exams.  6. Atrial fibrillation: Paroxysmal.  Very short episodes only noted by device interrogation, none recently.  He has had no prolonged or symptomatic atrial fibrillation (data for anticoagulating short runs of atrial fibrillation is not strong), so will hold off on anticoagulation.  - If he develops prolonged or symptomatic atrial fibrillation, would anticoagulate.    COVID screen The patient does not have any symptoms that suggest any further testing/ screening at this time.  Social distancing reinforced today.  Patient Risk: After full review of this patients clinical status, I feel that they are at moderate risk for cardiac decompensation at this time.  Relevant cardiac medications were reviewed at length with the patient today. The patient does not have concerns regarding their medications at this time.   Recommended follow-up:  3 months.   Today, I have spent 16 minutes with the patient with telehealth technology discussing the above issues .    Signed, [name], MD   09/17/2019  [place] [address] [facility] and [place] [address] [phone] (office) [phone] (fax)

## 2019-09-17 NOTE — Patient Instructions (Signed)
Labs 09/18/2019  Your physician recommends that you schedule a follow-up appointment in: 3 months, please call our office to schedule this appointment  If you have any questions or concerns before your next appointment please send [place] a message through [place] or call our office at [phone].  At the [facility], you and your health needs are our priority. As part of our continuing mission to provide you with exceptional heart care, we have created designated Provider Care Teams. These Care Teams include your primary Cardiologist (physician) and Advanced Practice Providers (APPs- Physician Assistants and Nurse Practitioners) who all work together to provide you with the care you need, when you need it.   You may see any of the following providers on your designated Care Team at your next follow up: Marland Kitchen Dr Glori Bickers . Dr [name] . Darrick Grinder, NP . Lyda Jester, PA . Audry Riles, PharmD   Please be sure to bring in all your medications bottles to every appointment.

## 2019-09-17 NOTE — Progress Notes (Signed)
Pt sch for labs 12/3, AVS sent via mychart

## 2019-09-18 ENCOUNTER — Encounter (HOSPITAL_COMMUNITY): Payer: Medicare Other | Admitting: Cardiology

## 2019-09-19 ENCOUNTER — Other Ambulatory Visit: Payer: Self-pay

## 2019-09-19 ENCOUNTER — Ambulatory Visit (HOSPITAL_COMMUNITY)
Admission: RE | Admit: 2019-09-19 | Discharge: 2019-09-19 | Disposition: A | Discharge status: Home / Self Care | Payer: Medicare Other | Source: Ambulatory Visit | Attending: Internal Medicine | Admitting: Internal Medicine

## 2019-09-19 DIAGNOSIS — I5022 Chronic systolic (congestive) heart failure: Secondary | ICD-10-CM | POA: Diagnosis present

## 2019-09-19 LAB — COMPREHENSIVE METABOLIC PANEL
ALT: 14 U/L (ref 0–44)
AST: 14 U/L — ABNORMAL LOW (ref 15–41)
Albumin: 3.7 g/dL (ref 3.5–5.0)
Alkaline Phosphatase: 64 U/L (ref 38–126)
Anion gap: 8 (ref 5–15)
BUN: 45 mg/dL — ABNORMAL HIGH (ref 8–23)
CO2: 27 mmol/L (ref 22–32)
Calcium: 9.5 mg/dL (ref 8.9–10.3)
Chloride: 108 mmol/L (ref 98–111)
Creatinine, Ser: 4.07 mg/dL — ABNORMAL HIGH (ref 0.61–1.24)
GFR calc Af Amer: 15 mL/min — ABNORMAL LOW (ref >60)
GFR calc non Af Amer: 13 mL/min — ABNORMAL LOW (ref >60)
Glucose, Bld: 92 mg/dL (ref 70–99)
Potassium: 3.6 mmol/L (ref 3.5–5.1)
Sodium: 143 mmol/L (ref 135–145)
Total Bilirubin: 0.7 mg/dL (ref 0.3–1.2)
Total Protein: 6.5 g/dL (ref 6.5–8.1)

## 2019-09-23 NOTE — Progress Notes (Signed)
Remote ICD transmission.   

## 2019-09-26 ENCOUNTER — Other Ambulatory Visit: Payer: Self-pay

## 2019-09-26 ENCOUNTER — Ambulatory Visit (INDEPENDENT_AMBULATORY_CARE_PROVIDER_SITE_OTHER): Payer: Medicare Other | Admitting: Internal Medicine

## 2019-09-26 ENCOUNTER — Encounter: Payer: Self-pay | Admitting: Internal Medicine

## 2019-09-26 VITALS — BP 114/60 | HR 61 | Ht 71.0 in | Wt 177.4 lb

## 2019-09-26 DIAGNOSIS — I472 Ventricular tachycardia, unspecified: Secondary | ICD-10-CM

## 2019-09-26 DIAGNOSIS — I420 Dilated cardiomyopathy: Secondary | ICD-10-CM | POA: Diagnosis not present

## 2019-09-26 DIAGNOSIS — Z95810 Presence of automatic (implantable) cardiac defibrillator: Secondary | ICD-10-CM | POA: Diagnosis not present

## 2019-09-26 DIAGNOSIS — I5022 Chronic systolic (congestive) heart failure: Secondary | ICD-10-CM | POA: Diagnosis not present

## 2019-09-26 NOTE — Progress Notes (Signed)
HPI [name] is returns today for ongoing followup of his ICD. He is a pleasant 78 yo man who has a h/o LV dysfunction dating back almost 10 years. His EF is 30%. He is on maximal medical therapy. Since his ICD was placed he has done well. He is traveling but does admit to dietary indiscretion. He has graduated from cardiac rehab and remains active.He has a past h/o VT with successful ICD therapy. He feels well otherwise. Since his last visit, no ICD therapy.  Allergies  Allergen Reactions  . Benicar [Olmesartan] Cough     Current Outpatient Medications  Medication Sig Dispense Refill  . allopurinol (ZYLOPRIM) 100 MG tablet TAKE 2 TABLETS BY MOUTH EVERY DAY 180 tablet 3  . amiodarone (PACERONE) 100 MG tablet Take 1 tablet (100 mg total) by mouth daily. 30 tablet 6  . carvedilol (COREG) 6.25 MG tablet TAKE 1 TABLET (6.25 MG TOTAL) BY MOUTH 2 (TWO) TIMES DAILY WITH A MEAL. 180 tablet 3  . Cholecalciferol (VITAMIN D3) 5000 units TABS Take 5,000 Units by mouth every morning.     . colchicine 0.6 MG tablet Take 0.6 mg by mouth daily as needed (gout flare up).    . ferric citrate (AURYXIA) 1 GM 210 MG(Fe) tablet Take 210 mg by mouth daily.    . furosemide (LASIX) 40 MG tablet TAKE 2 TABS BY MOUTH EVERY MORNING AND 1 TAB EVERY EVENING ALTERNATING WITH 2 TABS TWICE A DAY. 315 tablet 3  . hydrALAZINE (APRESOLINE) 25 MG tablet TAKE 1/2 TABLET BY MOUTH EVERY 8 (EIGHT) HOURS 135 tablet 1  . isosorbide mononitrate (IMDUR) 30 MG 24 hr tablet TAKE 1 TABLET BY MOUTH EVERY DAY 90 tablet 1   No current facility-administered medications for this visit.     Past Medical History:  Diagnosis Date  . Adenomatous colon polyp 2011  . AICD (automatic cardioverter/defibrillator) present   . Anal fistula 2003  . Arthritis   . CHF (congestive heart failure) ([place])   . CRI (chronic renal insufficiency)    creatinine back to normal  . Diverticulosis   . ED (erectile dysfunction)   . Gout   . Headache    . Hypertension   . Internal hemorrhoids   . Liver lesion 2007  . Nonischemic cardiomyopathy ([place])    EF 20-25% by echo 2016, At that time 40% RCA stenosis, distal 35% circumflex stenosis  . Renal cyst 2007   "I'm not familiar with that, but I follow up on my creatinine because my kidney function is low." 09/2016  . Sleep apnea    "had test; never followed thru w/getting mask" (11/25/2015)    ROS:   All systems reviewed and negative except as noted in the HPI.   Past Surgical History:  Procedure Laterality Date  . ANAL FISTULOTOMY  01/2002  . CARDIAC CATHETERIZATION N/A 04/16/2015   Procedure: Right/Left Heart Cath and Coronary Angiography;  Surgeon: Belva Crome, MD;  Location: [facility];  Service: Cardiovascular;  Laterality: N/A;  . EP IMPLANTABLE DEVICE N/A 11/24/2015   Procedure: ICD Implant;  Surgeon: Evans Lance, MD;  Location: [facility];  Service: Cardiovascular;  Laterality: N/A;  . ESOPHAGOGASTRODUODENOSCOPY (EGD) WITH PROPOFOL N/A 10/02/2016   Procedure: ESOPHAGOGASTRODUODENOSCOPY (EGD) WITH PROPOFOL;  Surgeon: Ladene Artist, MD;  Location: [facility] END[facility]COPY;  Service: Endoscopy;  Laterality: N/A;  . EUS N/A 10/19/2016   Procedure: UPPER ENDOSCOPIC ULTRASOUND (EUS) RADIAL;  Surgeon: Milus Banister,  MD;  Location: [facility] ENDOSCOPY;  Service: Endoscopy;  Laterality: N/A;  . INSERTION OF ICD  11/24/2015  . TONSILLECTOMY  ~ 1950     Family History  Problem Relation Age of Onset  . Heart failure Mother 9  . Sickle cell anemia Brother   . Breast cancer Sister   . Diabetes Sister   . Diabetes Father        died at age 14  . Diabetes Sister   . Colon polyps Brother   . Clotting disorder Other        two daughters (inherited from his first wife)     Social History   Socioeconomic History  . Marital status: Married    Spouse name: [name]  . Number of children: 4  . Years of education: Not on file  . Highest education level: Not on file  Occupational  History  . Occupation: Retired Environmental consultant  . Occupation: Retired Animal nutritionist  Tobacco Use  . Smoking status: Never Smoker  . Smokeless tobacco: Never Used  . Tobacco comment: "smoked briefly in my freshman year in college; purchased 1  pack of cigarettes; didn't finish that"  Substance and Sexual Activity  . Alcohol use: No    Alcohol/week: 0.0 standard drinks    Comment: 11/25/2015 "used to drink a beer q now in then back in my 20s"  . Drug use: No  . Sexual activity: Not Currently  Other Topics Concern  . Not on file  Social History Narrative  . Not on file   Social Determinants of Health   Financial Resource Strain:   . Difficulty of Paying Living Expenses: Not on file  Food Insecurity:   . Worried About Charity fundraiser in the Last Year: Not on file  . Ran Out of Food in the Last Year: Not on file  Transportation Needs:   . Lack of Transportation (Medical): Not on file  . Lack of Transportation (Non-Medical): Not on file  Physical Activity:   . Days of Exercise per Week: Not on file  . Minutes of Exercise per Session: Not on file  Stress:   . Feeling of Stress : Not on file  Social Connections:   . Frequency of Communication with Friends and Family: Not on file  . Frequency of Social Gatherings with Friends and Family: Not on file  . Attends Religious Services: Not on file  . Active Member of Clubs or Organizations: Not on file  . Attends Archivist Meetings: Not on file  . Marital Status: Not on file  Intimate Partner Violence:   . Fear of Current or Ex-Partner: Not on file  . Emotionally Abused: Not on file  . Physically Abused: Not on file  . Sexually Abused: Not on file     BP 114/60   Pulse 61   Ht 5\' 11"  (1.803 m)   Wt 177 lb 6.4 oz (80.5 kg)   SpO2 98%   BMI 24.74 kg/m   Physical Exam:  Well appearing NAD HEENT: Unremarkable Neck:  No JVD, no thyromegally Lymphatics:  No adenopathy Back:  No CVA tenderness Lungs:  Clear  with no wheezes HEART:  Regular rate rhythm, no murmurs, no rubs, no clicks Abd:  soft, positive bowel sounds, no organomegally, no rebound, no guarding Ext:  2 plus pulses, no edema, no cyanosis, no clubbing Skin:  No rashes no nodules Neuro:  CN II through XII intact, motor grossly intact  EKG - NSR with left  axis IVCD  DEVICE  Normal device function.  See PaceArt for details.   Assess/Plan: 1. VT - he has had no recurrent VT since his last visit. He will continue low dose amiodarone. 2. Chronic systolic heart failure - he has had no recurrent symptoms. He has class 2 symptoms. 3. ICD - his medtronic single chamber ICD is working normally. We will recheck in several months. 4. PAF - he has been asymptomatic and his interrogation suggests less than 0.1% PAF.  [name].D.

## 2019-09-26 NOTE — Patient Instructions (Signed)
Medication Instructions:  Your physician recommends that you continue on your current medications as directed. Please refer to the Current Medication list given to you today.  Labwork: None ordered.  Testing/Procedures: None ordered.  Follow-Up: Your physician wants you to follow-up in: one year with Dr. Lovena Le.   You will receive a reminder letter in the mail two months in advance. If you don't receive a letter, please call our office to schedule the follow-up appointment.  Remote monitoring is used to monitor your ICD from home. This monitoring reduces the number of office visits required to check your device to one time per year. It allows [place] to keep an eye on the functioning of your device to ensure it is working properly. You are scheduled for a device check from home on 10/27/2019. You may send your transmission at any time that day. If you have a wireless device, the transmission will be sent automatically. After your physician reviews your transmission, you will receive a postcard with your next transmission date.  Any Other Special Instructions Will Be Listed Below (If Applicable).  If you need a refill on your cardiac medications before your next appointment, please call your pharmacy.

## 2019-09-29 NOTE — Addendum Note (Signed)
Addended by: Rose Phi on: 09/29/2019 06:25 PM   Modules accepted: Orders

## 2019-10-22 DIAGNOSIS — N400 Enlarged prostate without lower urinary tract symptoms: Secondary | ICD-10-CM | POA: Diagnosis not present

## 2019-10-22 DIAGNOSIS — R972 Elevated prostate specific antigen [PSA]: Secondary | ICD-10-CM | POA: Diagnosis not present

## 2019-10-27 ENCOUNTER — Ambulatory Visit (INDEPENDENT_AMBULATORY_CARE_PROVIDER_SITE_OTHER): Payer: Medicare PPO

## 2019-10-27 DIAGNOSIS — I5022 Chronic systolic (congestive) heart failure: Secondary | ICD-10-CM

## 2019-10-27 DIAGNOSIS — Z95810 Presence of automatic (implantable) cardiac defibrillator: Secondary | ICD-10-CM | POA: Diagnosis not present

## 2019-10-29 ENCOUNTER — Telehealth: Payer: Self-pay

## 2019-10-29 NOTE — Telephone Encounter (Signed)
Remote ICM transmission received.  Attempted call to patient regarding ICM remote transmission and left detailed message per DPR.  Advised to return call for any fluid symptoms or questions.  

## 2019-10-29 NOTE — Progress Notes (Signed)
EPIC Encounter for ICM Monitoring  Patient Name: Miguel Roach is a 79 y.o. male Date: 10/29/2019 Primary Care Physican: Deland Pretty, MD Primary Cardiologist:[place] Electrophysiologist: Lovena Le Nephrologist: [place] Last Weight:173lbs   Attempted call to patient and unable to reach.  Left detailed message per DPR regarding transmission. Transmission reviewed.   Optivol thoracic impedancenormal.   Prescribed:Furosemide 40 mgtake2 tablets (80 mgtotal)twice a day alternating with 80 mg AM and 40 mg PM.  Labs: 06/16/2019 Creatinine 4.08, BUN 46, Potassium 3.7, Sodium 141, GFR 13-15 03/21/2019 Creatinine3.94, BUN44, Potassium3.9, Sodium142, [id number] 12/19/2018 Creatinine3.71, BUN50, Potassium4.0, Sodium140, [id number] 12/12/2018 Creatinine 4.16, BUN 60, Potassium 4.2, Sodium 139, GFR 13-15 A complete set of results can be found in Results Review.  Recommendations:  Left voice mail with ICM number and encouraged to call if experiencing any fluid symptoms.  Follow-up plan: ICM clinic phone appointment on 12/02/2019.   91 day device clinic remote transmission 12/01/2019.     Copy of ICM check sent to Dr. Lovena Le.  3 month ICM trend: 10/27/2019    1 Year ICM trend:       Rosalene Billings, RN 10/29/2019 10:17 AM

## 2019-10-30 ENCOUNTER — Ambulatory Visit: Payer: Medicare Other | Attending: Internal Medicine

## 2019-10-30 DIAGNOSIS — Z23 Encounter for immunization: Secondary | ICD-10-CM | POA: Insufficient documentation

## 2019-10-30 NOTE — Progress Notes (Signed)
   Covid-19 Vaccination Clinic  Name:  Miguel Roach    MRN: [id number] DOB: [birth year]  10/30/2019  Miguel Roach was observed post Covid-19 immunization for 15 minutes without incidence. He was provided with Vaccine Information Sheet and instruction to access the V-Safe system.   Miguel Roach was instructed to call 911 with any severe reactions post vaccine: Marland Kitchen Difficulty breathing  . Swelling of your face and throat  . A fast heartbeat  . A bad rash all over your body  . Dizziness and weakness    Immunizations Administered    Name Date Dose VIS Date Route   Pfizer COVID-19 Vaccine 10/30/2019 11:07 AM 0.3 mL 09/26/2019 Intramuscular   Manufacturer: [place]   Lot: [id number]   [place]: [id number]

## 2019-11-07 DIAGNOSIS — N184 Chronic kidney disease, stage 4 (severe): Secondary | ICD-10-CM | POA: Diagnosis not present

## 2019-11-12 DIAGNOSIS — N185 Chronic kidney disease, stage 5: Secondary | ICD-10-CM | POA: Diagnosis not present

## 2019-11-12 DIAGNOSIS — N2581 Secondary hyperparathyroidism of renal origin: Secondary | ICD-10-CM | POA: Diagnosis not present

## 2019-11-12 DIAGNOSIS — D631 Anemia in chronic kidney disease: Secondary | ICD-10-CM | POA: Diagnosis not present

## 2019-11-12 DIAGNOSIS — I120 Hypertensive chronic kidney disease with stage 5 chronic kidney disease or end stage renal disease: Secondary | ICD-10-CM | POA: Diagnosis not present

## 2019-11-19 ENCOUNTER — Ambulatory Visit: Payer: Medicare PPO | Attending: Internal Medicine

## 2019-11-19 DIAGNOSIS — Z23 Encounter for immunization: Secondary | ICD-10-CM | POA: Insufficient documentation

## 2019-11-19 NOTE — Progress Notes (Signed)
   Covid-19 Vaccination Clinic  Name:  Miguel Roach    MRN: [id number] DOB: Jan 27, 1941  11/19/2019  [name] was observed post Covid-19 immunization for 15 minutes without incidence. He was provided with Vaccine Information Sheet and instruction to access the V-Safe system.   [name] was instructed to call 911 with any severe reactions post vaccine: Marland Kitchen Difficulty breathing  . Swelling of your face and throat  . A fast heartbeat  . A bad rash all over your body  . Dizziness and weakness    Immunizations Administered    Name Date Dose VIS Date Route   Pfizer COVID-19 Vaccine 11/19/2019 10:35 AM 0.3 mL 09/26/2019 Intramuscular   Manufacturer: [place]   Lot: [id number]   [place]: [id number]

## 2019-12-01 ENCOUNTER — Ambulatory Visit (INDEPENDENT_AMBULATORY_CARE_PROVIDER_SITE_OTHER): Payer: Medicare PPO | Admitting: *Deleted

## 2019-12-01 DIAGNOSIS — I5022 Chronic systolic (congestive) heart failure: Secondary | ICD-10-CM

## 2019-12-01 LAB — CUP PACEART REMOTE DEVICE CHECK
Battery Remaining Longevity: 104 mo
Battery Voltage: 3.01 V
Brady Statistic RV Percent Paced: 0.3 %
Date Time Interrogation Session: 20210215044223
HighPow Impedance: 73 Ohm
Implantable Lead Implant Date: 20170208
Implantable Lead Location: 753860
Implantable Lead Model: 6935
Implantable Pulse Generator Implant Date: 20170208
Lead Channel Impedance Value: 342 Ohm
Lead Channel Impedance Value: 399 Ohm
Lead Channel Pacing Threshold Amplitude: 1 V
Lead Channel Pacing Threshold Pulse Width: 0.4 ms
Lead Channel Sensing Intrinsic Amplitude: 11.125 mV
Lead Channel Sensing Intrinsic Amplitude: 11.125 mV
Lead Channel Setting Pacing Amplitude: 2.25 V
Lead Channel Setting Pacing Pulse Width: 0.4 ms
Lead Channel Setting Sensing Sensitivity: 0.3 mV

## 2019-12-02 ENCOUNTER — Ambulatory Visit (INDEPENDENT_AMBULATORY_CARE_PROVIDER_SITE_OTHER): Payer: Medicare PPO

## 2019-12-02 DIAGNOSIS — I5022 Chronic systolic (congestive) heart failure: Secondary | ICD-10-CM

## 2019-12-02 DIAGNOSIS — Z95810 Presence of automatic (implantable) cardiac defibrillator: Secondary | ICD-10-CM | POA: Diagnosis not present

## 2019-12-02 NOTE — Progress Notes (Signed)
ICD Remote  

## 2019-12-09 NOTE — Progress Notes (Signed)
EPIC Encounter for ICM Monitoring  Patient Name: Miguel Roach is a 79 y.o. male Date: 12/09/2019 Primary Care Physican: Deland Pretty, MD Primary Cardiologist:[place] Electrophysiologist: Lovena Le Nephrologist: [place] LastWeight:173lbs   Transmission reviewed.  Optivol thoracic impedancenormal.   Prescribed:Furosemide 40 mgtake2 tablets (80 mgtotal)twice a day alternating with 80 mg AM and 40 mg PM.  Labs: 09/19/2019 Creatinine 4.07, BUN 45, Potassium 3.6, Sodium 143, GFR 13-15 06/16/2019 Creatinine 4.08, BUN 46, Potassium 3.7, Sodium 141, GFR 13-15 03/21/2019 Creatinine3.94, BUN44, Potassium3.9, Sodium142, [id number] 12/19/2018 Creatinine3.71, BUN50, Potassium4.0, Sodium140, [id number] 12/12/2018 Creatinine 4.16, BUN 60, Potassium 4.2, Sodium 139, GFR 13-15 A complete set of results can be found in Results Review.  Recommendations:None  Follow-up plan: ICM clinic phone appointment on3/22/2021. 91 day device clinic remote transmission 03/01/2020.   Copy of ICM check sent to Dr.Taylor.  3 month ICM trend: 12/01/2019    1 Year ICM trend:       Rosalene Billings, RN 12/09/2019 9:30 AM

## 2019-12-15 ENCOUNTER — Other Ambulatory Visit (HOSPITAL_COMMUNITY): Payer: Self-pay | Admitting: Cardiology

## 2020-01-05 ENCOUNTER — Ambulatory Visit (INDEPENDENT_AMBULATORY_CARE_PROVIDER_SITE_OTHER): Payer: Medicare PPO

## 2020-01-05 DIAGNOSIS — I5022 Chronic systolic (congestive) heart failure: Secondary | ICD-10-CM | POA: Diagnosis not present

## 2020-01-05 DIAGNOSIS — Z95810 Presence of automatic (implantable) cardiac defibrillator: Secondary | ICD-10-CM | POA: Diagnosis not present

## 2020-01-07 NOTE — Progress Notes (Signed)
EPIC Encounter for ICM Monitoring  Patient Name: Miguel Roach is a 79 y.o. male Date: 01/07/2020 Primary Care Physican: Deland Pretty, MD Primary [place] Electrophysiologist: Lovena Le Nephrologist: [place] 3/24/2021Weight:172lbs   Spoke with patient and reports feeling well at this time.  Denies fluid symptoms.  He has completed COVID vaccine.  He is walking for exercise.  Optivol thoracic impedancenormal.   Prescribed:Furosemide 40 mgtake2 tablets (80 mgtotal)twice a day alternating with 80 mg AM and 40 mg PM.  Labs: 09/19/2019 Creatinine 4.07, BUN 45, Potassium 3.6, Sodium 143, GFR 13-15 06/16/2019 Creatinine 4.08, BUN 46, Potassium 3.7, Sodium 141, GFR 13-15 03/21/2019 Creatinine3.94, BUN44, Potassium3.9, Sodium142, [id number] 12/19/2018 Creatinine3.71, BUN50, Potassium4.0, Sodium140, [id number] 12/12/2018 Creatinine 4.16, BUN 60, Potassium 4.2, Sodium 139, GFR 13-15 A complete set of results can be found in Results Review.  Recommendations:No changes and encouraged to call if experiencing any fluid symptoms.  Follow-up plan: ICM clinic phone appointment on4/26/2021. 91 day device clinic remote transmission 03/01/2020.   Copy of ICM check sent to Dr.Taylor.   3 month ICM trend: 01/05/2020    1 Year ICM trend:       Rosalene Billings, RN 01/07/2020 4:42 PM

## 2020-01-28 ENCOUNTER — Encounter: Payer: Self-pay | Admitting: Gastroenterology

## 2020-02-01 ENCOUNTER — Other Ambulatory Visit (HOSPITAL_COMMUNITY): Payer: Self-pay | Admitting: Cardiology

## 2020-02-09 ENCOUNTER — Ambulatory Visit (INDEPENDENT_AMBULATORY_CARE_PROVIDER_SITE_OTHER): Payer: Medicare PPO

## 2020-02-09 DIAGNOSIS — I5022 Chronic systolic (congestive) heart failure: Secondary | ICD-10-CM

## 2020-02-09 DIAGNOSIS — Z95810 Presence of automatic (implantable) cardiac defibrillator: Secondary | ICD-10-CM | POA: Diagnosis not present

## 2020-02-09 DIAGNOSIS — N185 Chronic kidney disease, stage 5: Secondary | ICD-10-CM | POA: Diagnosis not present

## 2020-02-11 ENCOUNTER — Telehealth: Payer: Self-pay

## 2020-02-11 NOTE — Telephone Encounter (Signed)
Remote ICM transmission received.  Attempted call to patient regarding ICM remote transmission and left detailed message per DPR.  Advised to return call for any fluid symptoms or questions. Next ICM remote transmission scheduled 03/16/2020.

## 2020-02-11 NOTE — Progress Notes (Signed)
EPIC Encounter for ICM Monitoring  Patient Name: Miguel Roach is a 79 y.o. male Date: 02/11/2020 Primary Care Physican: Deland Pretty, MD Primary Cardiologist:[place] Electrophysiologist: Lovena Le Nephrologist: [place] 3/24/2021Weight:172lbs   Attempted call to patient and unable to reach.  Left detailed message per DPR regarding transmission. Transmission reviewed.   Optivol thoracic impedancenormal.   Prescribed:Furosemide 40 mgtake2 tablets (80 mgtotal)twice a day alternating with 80 mg AM and 40 mg PM.  Labs: 09/19/2019 Creatinine 4.07, BUN 45, Potassium 3.6, Sodium 143, GFR 13-15 06/16/2019 Creatinine 4.08, BUN 46, Potassium 3.7, Sodium 141, GFR 13-15 03/21/2019 Creatinine3.94, BUN44, Potassium3.9, Sodium142, [id number] 12/19/2018 Creatinine3.71, BUN50, Potassium4.0, Sodium140, [id number] 12/12/2018 Creatinine 4.16, BUN 60, Potassium 4.2, Sodium 139, GFR 13-15 A complete set of results can be found in Results Review.  Recommendations:Left voice mail with ICM number and encouraged to call if experiencing any fluid symptoms.  Follow-up plan: ICM clinic phone appointment on6/10/2019. 91 day device clinic remote transmission5/17/2021.   Copy of ICM check sent to Dr.Taylor.   3 month ICM trend: 02/09/2020    1 Year ICM trend:       Rosalene Billings, RN 02/11/2020 9:53 AM

## 2020-02-16 ENCOUNTER — Telehealth: Payer: Self-pay | Admitting: *Deleted

## 2020-02-16 NOTE — Telephone Encounter (Signed)
Cathy, Please see phone note.  Patient needs to be seen by Dr. Fuller Plan or an App prior to colonoscopy.  Thank you.

## 2020-02-16 NOTE — Telephone Encounter (Signed)
Left message to call back to sch ov.

## 2020-02-16 NOTE — Telephone Encounter (Signed)
Dr. Fuller Plan,  On this patient's last Echo on 06/13/19, he had an EF of 30-35%.  Would you like to see him in the office prior to his procedure?   Thank you

## 2020-02-16 NOTE — Telephone Encounter (Signed)
Office visit with me or APP please

## 2020-02-19 DIAGNOSIS — I120 Hypertensive chronic kidney disease with stage 5 chronic kidney disease or end stage renal disease: Secondary | ICD-10-CM | POA: Diagnosis not present

## 2020-02-19 DIAGNOSIS — N2581 Secondary hyperparathyroidism of renal origin: Secondary | ICD-10-CM | POA: Diagnosis not present

## 2020-02-19 DIAGNOSIS — N185 Chronic kidney disease, stage 5: Secondary | ICD-10-CM | POA: Diagnosis not present

## 2020-02-19 DIAGNOSIS — D631 Anemia in chronic kidney disease: Secondary | ICD-10-CM | POA: Diagnosis not present

## 2020-02-26 DIAGNOSIS — N183 Chronic kidney disease, stage 3 unspecified: Secondary | ICD-10-CM | POA: Diagnosis not present

## 2020-02-26 DIAGNOSIS — Z125 Encounter for screening for malignant neoplasm of prostate: Secondary | ICD-10-CM | POA: Diagnosis not present

## 2020-02-26 DIAGNOSIS — D649 Anemia, unspecified: Secondary | ICD-10-CM | POA: Diagnosis not present

## 2020-02-26 DIAGNOSIS — I10 Essential (primary) hypertension: Secondary | ICD-10-CM | POA: Diagnosis not present

## 2020-03-01 ENCOUNTER — Ambulatory Visit (INDEPENDENT_AMBULATORY_CARE_PROVIDER_SITE_OTHER): Payer: Medicare PPO | Admitting: *Deleted

## 2020-03-01 DIAGNOSIS — I420 Dilated cardiomyopathy: Secondary | ICD-10-CM | POA: Diagnosis not present

## 2020-03-01 LAB — CUP PACEART REMOTE DEVICE CHECK
Battery Remaining Longevity: 101 mo
Battery Voltage: 3.01 V
Brady Statistic RV Percent Paced: 0.44 %
Date Time Interrogation Session: 20210517012302
HighPow Impedance: 69 Ohm
Implantable Lead Implant Date: 20170208
Implantable Lead Location: 753860
Implantable Lead Model: 6935
Implantable Pulse Generator Implant Date: 20170208
Lead Channel Impedance Value: 304 Ohm
Lead Channel Impedance Value: 399 Ohm
Lead Channel Pacing Threshold Amplitude: 0.875 V
Lead Channel Pacing Threshold Pulse Width: 0.4 ms
Lead Channel Sensing Intrinsic Amplitude: 11.5 mV
Lead Channel Sensing Intrinsic Amplitude: 11.5 mV
Lead Channel Setting Pacing Amplitude: 2 V
Lead Channel Setting Pacing Pulse Width: 0.4 ms
Lead Channel Setting Sensing Sensitivity: 0.3 mV

## 2020-03-02 NOTE — Progress Notes (Signed)
Remote ICD transmission.   

## 2020-03-04 DIAGNOSIS — D696 Thrombocytopenia, unspecified: Secondary | ICD-10-CM | POA: Diagnosis not present

## 2020-03-04 DIAGNOSIS — N2581 Secondary hyperparathyroidism of renal origin: Secondary | ICD-10-CM | POA: Diagnosis not present

## 2020-03-04 DIAGNOSIS — I480 Paroxysmal atrial fibrillation: Secondary | ICD-10-CM | POA: Diagnosis not present

## 2020-03-04 DIAGNOSIS — N184 Chronic kidney disease, stage 4 (severe): Secondary | ICD-10-CM | POA: Diagnosis not present

## 2020-03-04 DIAGNOSIS — M1A00X0 Idiopathic chronic gout, unspecified site, without tophus (tophi): Secondary | ICD-10-CM | POA: Diagnosis not present

## 2020-03-04 DIAGNOSIS — I10 Essential (primary) hypertension: Secondary | ICD-10-CM | POA: Diagnosis not present

## 2020-03-04 DIAGNOSIS — I5022 Chronic systolic (congestive) heart failure: Secondary | ICD-10-CM | POA: Diagnosis not present

## 2020-03-09 ENCOUNTER — Encounter: Payer: Medicare PPO | Admitting: Gastroenterology

## 2020-03-16 ENCOUNTER — Ambulatory Visit (INDEPENDENT_AMBULATORY_CARE_PROVIDER_SITE_OTHER): Payer: Medicare PPO

## 2020-03-16 DIAGNOSIS — Z95810 Presence of automatic (implantable) cardiac defibrillator: Secondary | ICD-10-CM

## 2020-03-16 DIAGNOSIS — I5022 Chronic systolic (congestive) heart failure: Secondary | ICD-10-CM

## 2020-03-16 NOTE — Progress Notes (Signed)
EPIC Encounter for ICM Monitoring  Patient Name: Miguel Roach is a 79 y.o. male Date: 03/16/2020 Primary Care Physican: Deland Pretty, MD Primary Cardiologist:[place] Electrophysiologist: Lovena Le Nephrologist: [place] 6/1/2021Weight:172lbs   Spoke with patient and reports feeling well at this time.  Denies fluid symptoms.    Optivol thoracic impedancenormal.   Prescribed:Furosemide 40 mgtake2 tablets (80 mgtotal)twice a day alternating with 80 mg AM and 40 mg PM.  Labs: 09/19/2019 Creatinine 4.07, BUN 45, Potassium 3.6, Sodium 143, GFR 13-15 06/16/2019 Creatinine 4.08, BUN 46, Potassium 3.7, Sodium 141, GFR 13-15 03/21/2019 Creatinine3.94, BUN44, Potassium3.9, Sodium142, [id number] 12/19/2018 Creatinine3.71, BUN50, Potassium4.0, Sodium140, [id number] 12/12/2018 Creatinine 4.16, BUN 60, Potassium 4.2, Sodium 139, GFR 13-15 A complete set of results can be found in Results Review.  Recommendations:No changes and encouraged to call if experiencing any fluid symptoms.  Follow-up plan: ICM clinic phone appointment on7/03/2020. 91 day device clinic remote transmission8/16/2021.   Copy of ICM check sent to Dr.Taylor.  3 month ICM trend: 03/16/2020    1 Year ICM trend:       Rosalene Billings, RN 03/16/2020 4:50 PM

## 2020-04-01 ENCOUNTER — Ambulatory Visit: Payer: Medicare PPO | Admitting: Gastroenterology

## 2020-04-04 ENCOUNTER — Other Ambulatory Visit (HOSPITAL_COMMUNITY): Payer: Self-pay | Admitting: Cardiology

## 2020-04-04 DIAGNOSIS — I5022 Chronic systolic (congestive) heart failure: Secondary | ICD-10-CM

## 2020-04-05 ENCOUNTER — Ambulatory Visit: Payer: Medicare PPO | Admitting: Gastroenterology

## 2020-04-05 ENCOUNTER — Encounter: Payer: Self-pay | Admitting: Gastroenterology

## 2020-04-05 VITALS — BP 112/72 | HR 73 | Ht 71.0 in | Wt 176.4 lb

## 2020-04-05 DIAGNOSIS — Z86010 Personal history of colonic polyps: Secondary | ICD-10-CM | POA: Diagnosis not present

## 2020-04-05 DIAGNOSIS — K2289 Other specified disease of esophagus: Secondary | ICD-10-CM

## 2020-04-05 DIAGNOSIS — K228 Other specified diseases of esophagus: Secondary | ICD-10-CM | POA: Diagnosis not present

## 2020-04-05 NOTE — Progress Notes (Signed)
History of Present Illness: This is a 79 year old male referred by Deland Pretty, MD for the evaluation of a personal history of adenomatous colon polyps.  Last colonoscopy as below.  He has no ongoing gastrointestinal complaints.  He is followed for chronic systolic heart failure and is status post ICD.  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 Denies weight loss, abdominal pain, constipation, diarrhea, change in stool caliber, melena, hematochezia, nausea, vomiting, dysphagia, reflux symptoms, chest pain.   Colonoscopy 02/2015 1. Sessile polyp in the ascending colon; polypectomy performed with cold forceps 2. Sessile polyp in the transverse colon; polypectomy performed with a cold snare 3. Semi-pedunculated polyp was found in the sigmoid colon; polypectomy performed using snare cautery 4. Mild diverticulosis in the sigmoid colon 5. Grade l internal hemorrhoids  Allergies  Allergen Reactions  . Benicar [Olmesartan] Cough   Outpatient Medications Prior to Visit  Medication Sig Dispense Refill  . allopurinol (ZYLOPRIM) 100 MG tablet TAKE 2 TABLETS BY MOUTH EVERY DAY 180 tablet 3  . amiodarone (PACERONE) 100 MG tablet TAKE 1 TABLET BY MOUTH EVERY DAY 90 tablet 2  . carvedilol (COREG) 6.25 MG tablet Take 1 tablet (6.25 mg total) by mouth 2 (two) times daily with a meal. Needs appt 180 tablet 3  . colchicine 0.6 MG tablet Take 0.6 mg by mouth daily as needed (gout flare up).    . ferric citrate (AURYXIA) 1 GM 210 MG(Fe) tablet Take 210 mg by mouth daily.    . furosemide (LASIX) 40 MG tablet TAKE 2 TABS BY MOUTH EVERY MORNING AND 1 TAB EVERY EVENING ALTERNATING WITH 2 TABS TWICE A DAY. 315 tablet 3  . hydrALAZINE (APRESOLINE) 25 MG tablet TAKE 1/2 TABLET BY MOUTH EVERY 8 (EIGHT) HOURS 135 tablet 1  . isosorbide mononitrate (IMDUR) 30 MG 24 hr tablet TAKE 1 TABLET BY MOUTH EVERY DAY 90 tablet 3  . Cholecalciferol (VITAMIN D3) 5000 units TABS Take 5,000 Units by mouth every  morning.      No facility-administered medications prior to visit.   Past Medical History:  Diagnosis Date  . Adenomatous colon polyp 2011  . AICD (automatic cardioverter/defibrillator) present   . Anal fistula 2003  . Arthritis   . CHF (congestive heart failure) ([place])   . CRI (chronic renal insufficiency)    creatinine back to normal  . Diverticulosis   . ED (erectile dysfunction)   . Gout   . Headache   . Hypertension   . Internal hemorrhoids   . Liver lesion 2007  . Nonischemic cardiomyopathy ([place])    EF 20-25% by echo 2016, At that time 40% RCA stenosis, distal 35% circumflex stenosis  . Renal cyst 2007   "I'm not familiar with that, but I follow up on my creatinine because my kidney function is low." 09/2016  . Sleep apnea    "had test; never followed thru w/getting mask" (11/25/2015)   Past Surgical History:  Procedure Laterality Date  . ANAL FISTULOTOMY  01/2002  . CARDIAC CATHETERIZATION N/A 04/16/2015   Procedure: Right/Left Heart Cath and Coronary Angiography;  Surgeon: Belva Crome, MD;  Location: [facility];  Service: Cardiovascular;  Laterality: N/A;  . EP IMPLANTABLE DEVICE N/A 11/24/2015   Procedure: ICD Implant;  Surgeon: Evans Lance, MD;  Location: [facility];  Service: Cardiovascular;  Laterality: N/A;  . ESOPHAGOGASTRODUODENOSCOPY (EGD) WITH PROPOFOL N/A 10/02/2016   Procedure: ESOPHAGOGASTRODUODENOSCOPY (EGD) WITH PROPOFOL;  Surgeon: Ladene Artist,  MD;  Location: [facility];  Service: Endoscopy;  Laterality: N/A;  . EUS N/A 10/19/2016   Procedure: UPPER ENDOSCOPIC ULTRASOUND (EUS) RADIAL;  Surgeon: Milus Banister, MD;  Location: [facility];  Service: Endoscopy;  Laterality: N/A;  . INSERTION OF ICD  11/24/2015  . TONSILLECTOMY  ~ 1950   Social History   Socioeconomic History  . Marital status: Married    Spouse name: [name]  . Number of children: 4  . Years of education: Not on file  . Highest education level: Not on file   Occupational History  . Occupation: Retired Environmental consultant  . Occupation: Retired Animal nutritionist  Tobacco Use  . Smoking status: Never Smoker  . Smokeless tobacco: Never Used  . Tobacco comment: "smoked briefly in my freshman year in college; purchased 1  pack of cigarettes; didn't finish that"  Vaping Use  . Vaping Use: Never used  Substance and Sexual Activity  . Alcohol use: No    Alcohol/week: 0.0 standard drinks    Comment: 11/25/2015 "used to drink a beer q now in then back in my 20s"  . Drug use: No  . Sexual activity: Not Currently  Other Topics Concern  . Not on file  Social History Narrative  . Not on file   Social Determinants of Health   Financial Resource Strain:   . Difficulty of Paying Living Expenses:   Food Insecurity:   . Worried About Charity fundraiser in the Last Year:   . Arboriculturist in the Last Year:   Transportation Needs:   . Film/video editor (Medical):   Marland Kitchen Lack of Transportation (Non-Medical):   Physical Activity:   . Days of Exercise per Week:   . Minutes of Exercise per Session:   Stress:   . Feeling of Stress :   Social Connections:   . Frequency of Communication with Friends and Family:   . Frequency of Social Gatherings with Friends and Family:   . Attends Religious Services:   . Active Member of Clubs or Organizations:   . Attends Archivist Meetings:   Marland Kitchen Marital Status:    Family History  Problem Relation Age of Onset  . Heart failure Mother 24  . Sickle cell anemia Brother   . Breast cancer Sister   . Diabetes Sister   . Diabetes Father        died at age 85  . Diabetes Sister   . Colon polyps Brother   . Clotting disorder Other        two daughters (inherited from his first wife)  . Colon cancer Neg Hx      Review of Systems: Pertinent positive and negative review of systems were noted in the above HPI section. All other review of systems were otherwise negative.   Physical Exam: General: Well  developed, well nourished, no acute distress Head: Normocephalic and atraumatic Eyes:  sclerae anicteric, EOMI Ears: Normal auditory acuity Mouth: Not examined, mask on during Covid-19 pandemic Neck: Supple, no masses or thyromegaly Lungs: Clear throughout to auscultation Heart: Regular rate and rhythm; no murmurs, rubs or bruits Abdomen: Soft, non tender and non distended. No masses, hepatosplenomegaly or hernias noted. Normal Bowel sounds Rectal: Not done Musculoskeletal: Symmetrical with no gross deformities  Skin: No lesions on visible extremities Pulses:  Normal pulses noted Extremities: No clubbing, cyanosis, edema or deformities noted Neurological: Alert oriented x 4, grossly nonfocal Cervical Nodes:  No significant cervical adenopathy Inguinal Nodes: No  significant inguinal adenopathy Psychological:  Alert and cooperative. Normal mood and affect   Assessment and Recommendations:  1.  Personal history of adenomatous colon polyps.  Given his age and comorbidities including chronic systolic heart failure EF 20-25%, status post ICD placement and CKD we will not plan for future surveillance colonoscopies.  He is comfortable with this shared decision.  GI follow-up as needed.  2.  Esophageal duplication cyst.  No follow-up needed.   cc: Deland Pretty, MD [address] [place],   [id number]

## 2020-04-05 NOTE — Patient Instructions (Signed)
If you are age 78 or older, your body mass index should be between 23-30. Your Body mass index is 24.6 kg/m. If this is out of the aforementioned range listed, please consider follow up with your Primary Care Provider.  If you are age 56 or younger, your body mass index should be between 19-25. Your Body mass index is 24.6 kg/m. If this is out of the aformentioned range listed, please consider follow up with your Primary Care Provider.    Follow-up as needed.   Thank you for choosing me and [facility].  Pricilla Riffle. Dagoberto Ligas., MD., Marval Regal

## 2020-04-20 ENCOUNTER — Ambulatory Visit (INDEPENDENT_AMBULATORY_CARE_PROVIDER_SITE_OTHER): Payer: Medicare PPO

## 2020-04-20 DIAGNOSIS — Z95810 Presence of automatic (implantable) cardiac defibrillator: Secondary | ICD-10-CM | POA: Diagnosis not present

## 2020-04-20 DIAGNOSIS — I5022 Chronic systolic (congestive) heart failure: Secondary | ICD-10-CM

## 2020-04-21 NOTE — Progress Notes (Signed)
EPIC Encounter for ICM Monitoring  Patient Name: Miguel Roach is a 79 y.o. male Date: 04/21/2020 Primary Care Physican: Deland Pretty, MD Primary Cardiologist:[place] Electrophysiologist: Lovena Le Nephrologist: [place] 7/7/2021Weight:172lbs   Spoke with patient and reports feeling well at this time.  Denies fluid symptoms.    Optivol thoracic impedancenormal.   Prescribed:Furosemide 40 mgtake2 tablets (80 mgtotal)twice a day alternating with 80 mg AM and 40 mg PM.  Labs: 09/19/2019 Creatinine 4.07, BUN 45, Potassium 3.6, Sodium 143, GFR 13-15 06/16/2019 Creatinine 4.08, BUN 46, Potassium 3.7, Sodium 141, GFR 13-15 03/21/2019 Creatinine3.94, BUN44, Potassium3.9, Sodium142, [id number] 12/19/2018 Creatinine3.71, BUN50, Potassium4.0, Sodium140, [id number] 12/12/2018 Creatinine 4.16, BUN 60, Potassium 4.2, Sodium 139, GFR 13-15 A complete set of results can be found in Results Review.  Recommendations:No changes and encouraged to call if experiencing any fluid symptoms.  Follow-up plan: ICM clinic phone appointment on8/06/2020. 91 day device clinic remote transmission8/16/2021.   EP/Cardiology Office Visits: Recall for 10/05/2020 with Dr. Lovena Le.    Copy of ICM check sent to Dr. Lovena Le.   3 month ICM trend: 04/20/2020    1 Year ICM trend:       Rosalene Billings, RN 04/21/2020 2:25 PM

## 2020-05-24 ENCOUNTER — Ambulatory Visit (INDEPENDENT_AMBULATORY_CARE_PROVIDER_SITE_OTHER): Payer: Medicare PPO

## 2020-05-24 DIAGNOSIS — Z95810 Presence of automatic (implantable) cardiac defibrillator: Secondary | ICD-10-CM | POA: Diagnosis not present

## 2020-05-24 DIAGNOSIS — I5022 Chronic systolic (congestive) heart failure: Secondary | ICD-10-CM

## 2020-05-26 NOTE — Progress Notes (Signed)
EPIC Encounter for ICM Monitoring  Patient Name: Miguel Roach is a 79 y.o. male Date: 05/26/2020 Primary Care Physican: Deland Pretty, MD Primary [place] Electrophysiologist: Lovena Le Nephrologist: [place] 7/7/2021Weight:172lbs   Spoke with patient and reports feeling well at this time.  Denies fluid symptoms.    Optivol thoracic impedancenormal.   Prescribed:Furosemide 40 mgtake2 tablets (80 mgtotal)twice a day alternating with 80 mg AM and 40 mg PM.  Labs: 09/19/2019 Creatinine 4.07, BUN 45, Potassium 3.6, Sodium 143, GFR 13-15 06/16/2019 Creatinine 4.08, BUN 46, Potassium 3.7, Sodium 141, GFR 13-15 03/21/2019 Creatinine3.94, BUN44, Potassium3.9, Sodium142, [id number] 12/19/2018 Creatinine3.71, BUN50, Potassium4.0, Sodium140, [id number] 12/12/2018 Creatinine 4.16, BUN 60, Potassium 4.2, Sodium 139, GFR 13-15 A complete set of results can be found in Results Review.  Recommendations: No changes and encouraged to call if experiencing any fluid symptoms.  Follow-up plan: ICM clinic phone appointment on9/13/2021. 91 day device clinic remote transmission8/16/2021.   EP/Cardiology Office Visits: Recall for 09/28/2020 with Dr. Lovena Le.    Copy of ICM check sent to Dr. Lovena Le.   3 month ICM trend: 05/24/2020    1 Year ICM trend:       Rosalene Billings, RN 05/26/2020 12:35 PM

## 2020-05-31 ENCOUNTER — Ambulatory Visit (INDEPENDENT_AMBULATORY_CARE_PROVIDER_SITE_OTHER): Payer: Medicare PPO | Admitting: *Deleted

## 2020-05-31 DIAGNOSIS — I472 Ventricular tachycardia, unspecified: Secondary | ICD-10-CM

## 2020-05-31 LAB — CUP PACEART REMOTE DEVICE CHECK
Battery Remaining Longevity: 97 mo
Battery Voltage: 3 V
Brady Statistic RV Percent Paced: 0.66 %
Date Time Interrogation Session: 20210816022703
HighPow Impedance: 71 Ohm
Implantable Lead Implant Date: 20170208
Implantable Lead Location: 753860
Implantable Lead Model: 6935
Implantable Pulse Generator Implant Date: 20170208
Lead Channel Impedance Value: 304 Ohm
Lead Channel Impedance Value: 361 Ohm
Lead Channel Pacing Threshold Amplitude: 1 V
Lead Channel Pacing Threshold Pulse Width: 0.4 ms
Lead Channel Sensing Intrinsic Amplitude: 12.375 mV
Lead Channel Sensing Intrinsic Amplitude: 12.375 mV
Lead Channel Setting Pacing Amplitude: 2 V
Lead Channel Setting Pacing Pulse Width: 0.4 ms
Lead Channel Setting Sensing Sensitivity: 0.3 mV

## 2020-06-01 DIAGNOSIS — N185 Chronic kidney disease, stage 5: Secondary | ICD-10-CM | POA: Diagnosis not present

## 2020-06-02 ENCOUNTER — Other Ambulatory Visit (HOSPITAL_COMMUNITY): Payer: Self-pay | Admitting: *Deleted

## 2020-06-02 MED ORDER — ALLOPURINOL 100 MG PO TABS: 200.0000 mg | ORAL_TABLET | Freq: Every day | ORAL | 3 refills | Status: DC

## 2020-06-02 NOTE — Progress Notes (Signed)
Remote ICD transmission.   

## 2020-06-10 DIAGNOSIS — I120 Hypertensive chronic kidney disease with stage 5 chronic kidney disease or end stage renal disease: Secondary | ICD-10-CM | POA: Diagnosis not present

## 2020-06-10 DIAGNOSIS — N2581 Secondary hyperparathyroidism of renal origin: Secondary | ICD-10-CM | POA: Diagnosis not present

## 2020-06-10 DIAGNOSIS — N185 Chronic kidney disease, stage 5: Secondary | ICD-10-CM | POA: Diagnosis not present

## 2020-06-10 DIAGNOSIS — D631 Anemia in chronic kidney disease: Secondary | ICD-10-CM | POA: Diagnosis not present

## 2020-06-18 ENCOUNTER — Other Ambulatory Visit (HOSPITAL_COMMUNITY): Payer: Self-pay | Admitting: Cardiology

## 2020-06-28 ENCOUNTER — Ambulatory Visit (INDEPENDENT_AMBULATORY_CARE_PROVIDER_SITE_OTHER): Payer: Medicare PPO

## 2020-06-28 DIAGNOSIS — Z95810 Presence of automatic (implantable) cardiac defibrillator: Secondary | ICD-10-CM | POA: Diagnosis not present

## 2020-06-28 DIAGNOSIS — I5022 Chronic systolic (congestive) heart failure: Secondary | ICD-10-CM | POA: Diagnosis not present

## 2020-06-30 NOTE — Progress Notes (Signed)
EPIC Encounter for ICM Monitoring  Patient Name: Miguel Roach is a 79 y.o. male Date: 06/30/2020 Primary Care Physican: Deland Pretty, MD Primary Cardiologist:[place] Electrophysiologist: Lovena Le Nephrologist: [place] 7/7/2021Weight:172lbs   Spoke with patient and reports feeling well at this time.  Denies fluid symptoms.    Optivol thoracic impedancesuggesting normal fluid levels.   Prescribed:Furosemide 40 mgtake2 tablets (80 mgtotal)twice a day alternating with 80 mg AM and 40 mg PM.  Labs: 09/19/2019 Creatinine 4.07, BUN 45, Potassium 3.6, Sodium 143, GFR 13-15 06/16/2019 Creatinine 4.08, BUN 46, Potassium 3.7, Sodium 141, GFR 13-15 03/21/2019 Creatinine3.94, BUN44, Potassium3.9, Sodium142, [id number] 12/19/2018 Creatinine3.71, BUN50, Potassium4.0, Sodium140, [id number] 12/12/2018 Creatinine 4.16, BUN 60, Potassium 4.2, Sodium 139, GFR 13-15 A complete set of results can be found in Results Review.  Recommendations: No changes and encouraged to call if experiencing any fluid symptoms.  Follow-up plan: ICM clinic phone appointment on10/18/2021. 91 day device clinic remote transmission11/15/2021.   EP/Cardiology Office Visits:Recall for 09/28/2020 with Dr.Taylor.   Copy of ICM check sent to Dr.Taylor.   3 month ICM trend: 06/28/2020    1 Year ICM trend:       Rosalene Billings, RN 06/30/2020 2:08 PM

## 2020-07-28 ENCOUNTER — Ambulatory Visit: Payer: Medicare PPO | Attending: Internal Medicine

## 2020-07-28 ENCOUNTER — Other Ambulatory Visit (HOSPITAL_COMMUNITY): Payer: Self-pay | Admitting: Internal Medicine

## 2020-07-28 DIAGNOSIS — Z23 Encounter for immunization: Secondary | ICD-10-CM

## 2020-07-28 NOTE — Progress Notes (Signed)
   Covid-19 Vaccination Clinic  Name:  KAMIN NIBLACK    MRN: [id number] DOB: 01-Jan-1941  07/28/2020  Mr. Fakhouri was observed post Covid-19 immunization for 15 minutes without incident. He was provided with Vaccine Information Sheet and instruction to access the V-Safe system.   Mr. Zavadil was instructed to call 911 with any severe reactions post vaccine: Marland Kitchen Difficulty breathing  . Swelling of face and throat  . A fast heartbeat  . A bad rash all over body  . Dizziness and weakness

## 2020-08-02 ENCOUNTER — Ambulatory Visit (INDEPENDENT_AMBULATORY_CARE_PROVIDER_SITE_OTHER): Payer: Medicare PPO

## 2020-08-02 DIAGNOSIS — Z95810 Presence of automatic (implantable) cardiac defibrillator: Secondary | ICD-10-CM | POA: Diagnosis not present

## 2020-08-02 DIAGNOSIS — I5022 Chronic systolic (congestive) heart failure: Secondary | ICD-10-CM | POA: Diagnosis not present

## 2020-08-04 NOTE — Progress Notes (Signed)
EPIC Encounter for ICM Monitoring  Patient Name: Miguel Roach is a 79 y.o. male Date: 08/04/2020 Primary Care Physican: Deland Pretty, MD Primary Cardiologist:[place] Electrophysiologist: Lovena Le Nephrologist: [place] 10/20/2021Weight:167-172lbs   Spoke with patient and reports feeling well at this time.  Denies fluid symptoms.    Optivol thoracic impedancesuggesting normal fluid levels.   Prescribed:Furosemide 40 mgtake2 tablets (80 mgtotal)twice a day alternating with 80 mg AM and 40 mg PM.  Labs: 09/19/2019 Creatinine 4.07, BUN 45, Potassium 3.6, Sodium 143, GFR 13-15 06/16/2019 Creatinine 4.08, BUN 46, Potassium 3.7, Sodium 141, GFR 13-15 03/21/2019 Creatinine3.94, BUN44, Potassium3.9, Sodium142, [id number] 12/19/2018 Creatinine3.71, BUN50, Potassium4.0, Sodium140, [id number] 12/12/2018 Creatinine 4.16, BUN 60, Potassium 4.2, Sodium 139, GFR 13-15 A complete set of results can be found in Results Review.  Recommendations:No changes and encouraged to call if experiencing any fluid symptoms.  Follow-up plan: ICM clinic phone appointment on11/22/2021. 91 day device clinic remote transmission11/15/2021.   EP/Cardiology Office Visits:Recall for 09/28/2020 with Dr.Taylor. Last visit with Dr Aundra Dubin was 09/16/2019 (virtual), he was due for a 3 month f/u in March 2021.  Copy of ICM check sent to Dr.Taylor.  3 month ICM trend: 08/02/2020    1 Year ICM trend:       Rosalene Billings, RN 08/04/2020 9:36 AM

## 2020-08-24 DIAGNOSIS — N185 Chronic kidney disease, stage 5: Secondary | ICD-10-CM | POA: Diagnosis not present

## 2020-08-30 ENCOUNTER — Ambulatory Visit (INDEPENDENT_AMBULATORY_CARE_PROVIDER_SITE_OTHER): Payer: Medicare PPO

## 2020-08-30 DIAGNOSIS — I120 Hypertensive chronic kidney disease with stage 5 chronic kidney disease or end stage renal disease: Secondary | ICD-10-CM | POA: Diagnosis not present

## 2020-08-30 DIAGNOSIS — I420 Dilated cardiomyopathy: Secondary | ICD-10-CM

## 2020-08-30 DIAGNOSIS — N2581 Secondary hyperparathyroidism of renal origin: Secondary | ICD-10-CM | POA: Diagnosis not present

## 2020-08-30 DIAGNOSIS — N185 Chronic kidney disease, stage 5: Secondary | ICD-10-CM | POA: Diagnosis not present

## 2020-08-30 DIAGNOSIS — D631 Anemia in chronic kidney disease: Secondary | ICD-10-CM | POA: Diagnosis not present

## 2020-08-30 LAB — CUP PACEART REMOTE DEVICE CHECK
Battery Remaining Longevity: 92 mo
Battery Voltage: 3.01 V
Brady Statistic RV Percent Paced: 0.26 %
Date Time Interrogation Session: 20211115012502
HighPow Impedance: 67 Ohm
Implantable Lead Implant Date: 20170208
Implantable Lead Location: 753860
Implantable Lead Model: 6935
Implantable Pulse Generator Implant Date: 20170208
Lead Channel Impedance Value: 304 Ohm
Lead Channel Impedance Value: 399 Ohm
Lead Channel Pacing Threshold Amplitude: 0.875 V
Lead Channel Pacing Threshold Pulse Width: 0.4 ms
Lead Channel Sensing Intrinsic Amplitude: 11.125 mV
Lead Channel Sensing Intrinsic Amplitude: 11.125 mV
Lead Channel Setting Pacing Amplitude: 2 V
Lead Channel Setting Pacing Pulse Width: 0.4 ms
Lead Channel Setting Sensing Sensitivity: 0.3 mV

## 2020-09-01 NOTE — Progress Notes (Signed)
Remote ICD transmission.   

## 2020-09-03 DIAGNOSIS — I10 Essential (primary) hypertension: Secondary | ICD-10-CM | POA: Diagnosis not present

## 2020-09-03 DIAGNOSIS — D649 Anemia, unspecified: Secondary | ICD-10-CM | POA: Diagnosis not present

## 2020-09-06 ENCOUNTER — Ambulatory Visit (INDEPENDENT_AMBULATORY_CARE_PROVIDER_SITE_OTHER): Payer: Medicare PPO

## 2020-09-06 DIAGNOSIS — Z95810 Presence of automatic (implantable) cardiac defibrillator: Secondary | ICD-10-CM

## 2020-09-06 DIAGNOSIS — I5022 Chronic systolic (congestive) heart failure: Secondary | ICD-10-CM | POA: Diagnosis not present

## 2020-09-07 NOTE — Progress Notes (Signed)
EPIC Encounter for ICM Monitoring  Patient Name: Miguel Roach is a 79 y.o. male Date: 09/07/2020 Primary Care Physican: Deland Pretty, MD Primary Cardiologist:[place] Electrophysiologist: Lovena Le Nephrologist: [place] (every 3 months) 10/20/2021Weight:167-172lbs   Spoke with patient and reports feeling well at this time.  Denies fluid symptoms.    Optivol thoracic impedancesuggestingnormalfluid levels.   Prescribed:Furosemide 40 mgtake2 tablets (80 mgtotal)twice a day alternating with 80 mg AM and 40 mg PM.  Labs: 09/19/2019 Creatinine 4.07, BUN 45, Potassium 3.6, Sodium 143, GFR 13-15 06/16/2019 Creatinine 4.08, BUN 46, Potassium 3.7, Sodium 141, GFR 13-15 03/21/2019 Creatinine3.94, BUN44, Potassium3.9, Sodium142, [id number] 12/19/2018 Creatinine3.71, BUN50, Potassium4.0, Sodium140, [id number] 12/12/2018 Creatinine 4.16, BUN 60, Potassium 4.2, Sodium 139, GFR 13-15 A complete set of results can be found in Results Review.  Recommendations:No changes and encouraged to call if experiencing any fluid symptoms.  Follow-up plan: ICM clinic phone appointment on12/27/2021. 91 day device clinic remote transmission2/14/2022.   EP/Cardiology Office Visits: 10/22/2020 with Dr.Taylor. Advised to call Dr Claris Gladden office to schedule an appointment since last visit was 09/16/2019.  Copy of ICM check sent to Dr.Taylor.  3 month ICM trend: 09/06/2020    1 Year ICM trend:       Rosalene Billings, RN 09/07/2020 1:40 PM

## 2020-09-11 ENCOUNTER — Other Ambulatory Visit (HOSPITAL_COMMUNITY): Payer: Self-pay | Admitting: Cardiology

## 2020-09-13 ENCOUNTER — Telehealth (HOSPITAL_COMMUNITY): Payer: Self-pay | Admitting: Cardiology

## 2020-09-13 DIAGNOSIS — Z1212 Encounter for screening for malignant neoplasm of rectum: Secondary | ICD-10-CM | POA: Diagnosis not present

## 2020-09-13 DIAGNOSIS — N2581 Secondary hyperparathyroidism of renal origin: Secondary | ICD-10-CM | POA: Diagnosis not present

## 2020-09-13 DIAGNOSIS — I5022 Chronic systolic (congestive) heart failure: Secondary | ICD-10-CM | POA: Diagnosis not present

## 2020-09-13 DIAGNOSIS — E778 Other disorders of glycoprotein metabolism: Secondary | ICD-10-CM | POA: Diagnosis not present

## 2020-09-13 DIAGNOSIS — M1A00X0 Idiopathic chronic gout, unspecified site, without tophus (tophi): Secondary | ICD-10-CM | POA: Diagnosis not present

## 2020-09-13 DIAGNOSIS — I480 Paroxysmal atrial fibrillation: Secondary | ICD-10-CM | POA: Diagnosis not present

## 2020-09-13 DIAGNOSIS — I10 Essential (primary) hypertension: Secondary | ICD-10-CM | POA: Diagnosis not present

## 2020-09-13 DIAGNOSIS — D696 Thrombocytopenia, unspecified: Secondary | ICD-10-CM | POA: Diagnosis not present

## 2020-09-13 DIAGNOSIS — R972 Elevated prostate specific antigen [PSA]: Secondary | ICD-10-CM | POA: Diagnosis not present

## 2020-09-13 DIAGNOSIS — G4733 Obstructive sleep apnea (adult) (pediatric): Secondary | ICD-10-CM | POA: Diagnosis not present

## 2020-09-13 MED ORDER — AMIODARONE HCL 100 MG PO TABS: 100.0000 mg | ORAL_TABLET | Freq: Every day | ORAL | 2 refills | Status: DC

## 2020-09-13 NOTE — Telephone Encounter (Signed)
Pt request refill for amiodarone 100 MG, please send script to [id number], pt scheduled appt 01/13 w/DM. Thanks

## 2020-09-13 NOTE — Telephone Encounter (Signed)
Done

## 2020-10-01 ENCOUNTER — Telehealth: Payer: Self-pay | Admitting: Hematology and Oncology

## 2020-10-01 NOTE — Telephone Encounter (Signed)
Received a new pt referral from [place] for amyloidsis. Mr. Loflin returned my call and has been scheduled to see Dr. Chryl Heck on 1/11 at 11am. Pt aware to arrive 30 minutes early.

## 2020-10-11 ENCOUNTER — Ambulatory Visit (INDEPENDENT_AMBULATORY_CARE_PROVIDER_SITE_OTHER): Payer: Medicare PPO

## 2020-10-11 DIAGNOSIS — Z95810 Presence of automatic (implantable) cardiac defibrillator: Secondary | ICD-10-CM | POA: Diagnosis not present

## 2020-10-11 DIAGNOSIS — I5022 Chronic systolic (congestive) heart failure: Secondary | ICD-10-CM

## 2020-10-12 DIAGNOSIS — H35373 Puckering of macula, bilateral: Secondary | ICD-10-CM | POA: Diagnosis not present

## 2020-10-12 DIAGNOSIS — H25813 Combined forms of age-related cataract, bilateral: Secondary | ICD-10-CM | POA: Diagnosis not present

## 2020-10-13 NOTE — Progress Notes (Signed)
EPIC Encounter for ICM Monitoring  Patient Name: Miguel Roach is a 79 y.o. male Date: 10/13/2020 Primary Care Physican: Deland Pretty, MD Primary Cardiologist:[place] Electrophysiologist: Lovena Le Nephrologist: [place] (every 3 months) 10/20/2021Weight:167-172lbs   Spoke with patient and reports feeling well at this time.  Denies fluid symptoms.     Optivol thoracic impedancesuggestingnormalfluid levels.   Prescribed:Furosemide 40 mgtake2 tablets (80 mgtotal)twice a day alternating with 80 mg AM and 40 mg PM.  Labs: 09/19/2019 Creatinine 4.07, BUN 45, Potassium 3.6, Sodium 143, GFR 13-15 06/16/2019 Creatinine 4.08, BUN 46, Potassium 3.7, Sodium 141, GFR 13-15 03/21/2019 Creatinine3.94, BUN44, Potassium3.9, Sodium142, [id number] 12/19/2018 Creatinine3.71, BUN50, Potassium4.0, Sodium140, [id number] 12/12/2018 Creatinine 4.16, BUN 60, Potassium 4.2, Sodium 139, GFR 13-15 A complete set of results can be found in Results Review.  Recommendations No changes and encouraged to call if experiencing any fluid symptoms.  Follow-up plan: ICM clinic phone appointment on2/04/2021. 91 day device clinic remote transmission2/14/2022.   EP/Cardiology Office Visits: 10/22/2020 with Dr.Taylor. 10/28/2020 with Dr Aundra Dubin  Copy of ICM check sent to Dr.Taylor.  3 month ICM trend: 10/11/2020    1 Year ICM trend:       Rosalene Billings, RN 10/13/2020 1:43 PM

## 2020-10-22 ENCOUNTER — Ambulatory Visit: Payer: Medicare PPO | Admitting: Internal Medicine

## 2020-10-22 ENCOUNTER — Other Ambulatory Visit: Payer: Self-pay

## 2020-10-22 ENCOUNTER — Encounter: Payer: Self-pay | Admitting: Internal Medicine

## 2020-10-22 VITALS — BP 100/56 | HR 64 | Ht 71.0 in | Wt 172.2 lb

## 2020-10-22 DIAGNOSIS — I472 Ventricular tachycardia, unspecified: Secondary | ICD-10-CM

## 2020-10-22 DIAGNOSIS — Z95810 Presence of automatic (implantable) cardiac defibrillator: Secondary | ICD-10-CM | POA: Diagnosis not present

## 2020-10-22 DIAGNOSIS — I5022 Chronic systolic (congestive) heart failure: Secondary | ICD-10-CM

## 2020-10-22 NOTE — Patient Instructions (Signed)
Medication Instructions:  Your physician recommends that you continue on your current medications as directed. Please refer to the Current Medication list given to you today.  Labwork: None ordered.  Testing/Procedures: None ordered.  Follow-Up: Your physician wants you to follow-up in: one year with Cristopher Peru, MD or one of the following Advanced Practice Providers on your designated Care Team:    Chanetta Marshall, NP  Tommye Standard, PA-C  Legrand Como "Jonni Sanger" [place], [place]  Remote monitoring is used to monitor your ICD from home. This monitoring reduces the number of office visits required to check your device to one time per year. It allows [place] to keep an eye on the functioning of your device to ensure it is working properly. You are scheduled for a device check from home on 11/22/2020. You may send your transmission at any time that day. If you have a wireless device, the transmission will be sent automatically. After your physician reviews your transmission, you will receive a postcard with your next transmission date.  Any Other Special Instructions Will Be Listed Below (If Applicable).  If you need a refill on your cardiac medications before your next appointment, please call your pharmacy.

## 2020-10-22 NOTE — Progress Notes (Signed)
HPI [name] is returns today for ongoing followup of his ICD. He is a pleasant 80 yo man who has a h/o LV dysfunction dating back almost 11 years. His EF is 30%. He is on maximal medical therapy. Since his ICD was placed he has done well. He is traveling but does admit to dietary indiscretion. He has graduated from cardiac rehab and remains active.Hehas a past h/o VT with successful ICD therapy.He feels well otherwise. Since his last visit, no ICD therapy. Allergies  Allergen Reactions  . Benicar [Olmesartan] Cough     Current Outpatient Medications  Medication Sig Dispense Refill  . allopurinol (ZYLOPRIM) 100 MG tablet Take 2 tablets (200 mg total) by mouth daily. 180 tablet 3  . amiodarone (PACERONE) 100 MG tablet Take 1 tablet (100 mg total) by mouth daily. 30 tablet 2  . carvedilol (COREG) 6.25 MG tablet Take 1 tablet (6.25 mg total) by mouth 2 (two) times daily with a meal. Needs appt 180 tablet 3  . colchicine 0.6 MG tablet Take 0.6 mg by mouth daily as needed (gout flare up).    . ferric citrate (AURYXIA) 1 GM 210 MG(Fe) tablet Take 65 mg by mouth daily.    . furosemide (LASIX) 40 MG tablet TAKE 2 TABS BY MOUTH EVERY MORNING AND 1 TAB EVERY EVENING ALTERNATING WITH 2 TABS TWICE A DAY. 315 tablet 3  . hydrALAZINE (APRESOLINE) 25 MG tablet TAKE 1/2 TABLET BY MOUTH EVERY 8 (EIGHT) HOURS 135 tablet 1  . isosorbide mononitrate (IMDUR) 30 MG 24 hr tablet TAKE 1 TABLET BY MOUTH EVERY DAY 90 tablet 3   No current facility-administered medications for this visit.     Past Medical History:  Diagnosis Date  . Adenomatous colon polyp 2011  . AICD (automatic cardioverter/defibrillator) present   . Anal fistula 2003  . Arthritis   . CHF (congestive heart failure) ([place])   . CRI (chronic renal insufficiency)    creatinine back to normal  . Diverticulosis   . ED (erectile dysfunction)   . Gout   . Headache   . Hypertension   . Internal hemorrhoids   . Liver lesion 2007  .  Nonischemic cardiomyopathy ([place])    EF 20-25% by echo 2016, At that time 40% RCA stenosis, distal 35% circumflex stenosis  . Renal cyst 2007   "I'm not familiar with that, but I follow up on my creatinine because my kidney function is low." 09/2016  . Sleep apnea    "had test; never followed thru w/getting mask" (11/25/2015)    ROS:   All systems reviewed and negative except as noted in the HPI.   Past Surgical History:  Procedure Laterality Date  . ANAL FISTULOTOMY  01/2002  . CARDIAC CATHETERIZATION N/A 04/16/2015   Procedure: Right/Left Heart Cath and Coronary Angiography;  Surgeon: Belva Crome, MD;  Location: [facility];  Service: Cardiovascular;  Laterality: N/A;  . EP IMPLANTABLE DEVICE N/A 11/24/2015   Procedure: ICD Implant;  Surgeon: Evans Lance, MD;  Location: [facility];  Service: Cardiovascular;  Laterality: N/A;  . ESOPHAGOGASTRODUODENOSCOPY (EGD) WITH PROPOFOL N/A 10/02/2016   Procedure: ESOPHAGOGASTRODUODENOSCOPY (EGD) WITH PROPOFOL;  Surgeon: Ladene Artist, MD;  Location: [facility] ENDOSCOPY;  Service: Endoscopy;  Laterality: N/A;  . EUS N/A 10/19/2016   Procedure: UPPER ENDOSCOPIC ULTRASOUND (EUS) RADIAL;  Surgeon: Milus Banister, MD;  Location: [facility] ENDOSCOPY;  Service: Endoscopy;  Laterality: N/A;  . INSERTION OF ICD  11/24/2015  . TONSILLECTOMY  ~ 1950     Family History  Problem Relation Age of Onset  . Heart failure Mother 67  . Sickle cell anemia Brother   . Breast cancer Sister   . Diabetes Sister   . Diabetes Father        died at age 63  . Diabetes Sister   . Colon polyps Brother   . Clotting disorder Other        two daughters (inherited from his first wife)  . Colon cancer Neg Hx      Social History   Socioeconomic History  . Marital status: Married    Spouse name: [name]  . Number of children: 4  . Years of education: Not on file  . Highest education level: Not on file  Occupational History  . Occupation: Retired Environmental consultant   . Occupation: Retired Animal nutritionist  Tobacco Use  . Smoking status: Never Smoker  . Smokeless tobacco: Never Used  . Tobacco comment: "smoked briefly in my freshman year in college; purchased 1  pack of cigarettes; didn't finish that"  Vaping Use  . Vaping Use: Never used  Substance and Sexual Activity  . Alcohol use: No    Alcohol/week: 0.0 standard drinks    Comment: 11/25/2015 "used to drink a beer q now in then back in my 20s"  . Drug use: No  . Sexual activity: Not Currently  Other Topics Concern  . Not on file  Social History Narrative  . Not on file   Social Determinants of Health   Financial Resource Strain: Not on file  Food Insecurity: Not on file  Transportation Needs: Not on file  Physical Activity: Not on file  Stress: Not on file  Social Connections: Not on file  Intimate Partner Violence: Not on file     BP (!) 100/56   Pulse 64   Ht 5\' 11"  (1.803 m)   Wt 172 lb 3.2 oz (78.1 kg)   SpO2 96%   BMI 24.02 kg/m   Physical Exam:  Well appearing NAD HEENT: Unremarkable Neck:  No JVD, no thyromegally Lymphatics:  No adenopathy Back:  No CVA tenderness Lungs:  Clear with no wheezes HEART:  Regular rate rhythm, no murmurs, no rubs, no clicks Abd:  soft, positive bowel sounds, no organomegally, no rebound, no guarding Ext:  2 plus pulses, no edema, no cyanosis, no clubbing Skin:  No rashes no nodules Neuro:  CN II through XII intact, motor grossly intact  DEVICE  Normal device function.  See PaceArt for details.   Assess/Plan: 1. VT - he has had no recurrent VT since his last visit. He will continue low dose amiodarone. 2. Chronic systolic heart failure - he has had no recurrent symptoms. He has class 2 symptoms. 3. ICD - his medtronic single chamber ICD is working normally. We will recheck in several months. 4. PAF - he has been asymptomatic and his interrogation suggests less than 0.1% PAF.  Carleene Overlie Shaquayla Klimas,MD

## 2020-10-24 NOTE — Progress Notes (Signed)
[facility] NOTE  Patient Care Team: Deland Pretty, MD as PCP - General (Internal Medicine) Larey Dresser, MD as PCP - Advanced Heart Failure (Cardiology)  CHIEF COMPLAINTS/PURPOSE OF CONSULTATION:  Concern for amyloidosis.   ASSESSMENT & PLAN:  No problem-specific Assessment & Plan notes found for this encounter.  This is a very pleasant 80 year old male patient with past medical history significant for congestive heart failure, chronic kidney disease, hypertension, gout referred to hematology for evaluation of possible amyloidosis given his unexplained congestive heart failure as well as worsening chronic kidney disease.  Miguel Roach arrived to the appointment today with his wife.  He absolutely denies any complaints for me.  He denies any B symptoms, skin rash, easy bruising, abdominal pain, change in bowel habits or change in urinary habits.  He tells me that he has had hypertension for a while before he got on medication and he wondered if this could have contributed to his heart disease. He has had chronic kidney disease which has slowly progressed over the past few years but according to the patient, this has remained quite stable in the past year.  He denies any episodes of orthostasis or lightheadedness, unexplained bruising, change in bowel habits, skin rashes etc. today.  Physical examination quite unremarkable, no bilateral lower extremity edema, plus mildly, hepatosplenomegaly, lymphadenopathy or large bruises.  At this time given the chronicity of the symptoms, I have less suspicion for systemic amyloidosis but I agree with the evaluation for systemic amyloidosis on his blood.  I ordered CBC, SPEP, kappa lambda ratio, UPEP, beta-2 microglobulin which could be high given his chronic kidney disease, LDH.  I do not feel compelled at this time to proceed with bone marrow biopsy lab evaluation is unremarkable.  Also he has most recently seen his cardiologist who felt  his cardiac issues have remained quite stable but if we continue to have suspicion for amyloidosis as a possible cause for his heart failure, I would recommend to proceed with MRI of his heart to look for changes consistent with amyloidosis.  We will discuss more about this at our next visit which he requested to be a telephone visit. All his questions were answered to the best of my knowledge. Thank you for consulting [place] in the care of this patient.  Please not hesitate to contact us with any additional questions or concerns.  Orders Placed This Encounter  Procedures  . CBC with Differential/Platelet    Standing Status:   Standing    Number of Occurrences:   22    Standing Expiration Date:   10/26/2021  . Comprehensive metabolic panel    Standing Status:   Standing    Number of Occurrences:   33    Standing Expiration Date:   10/26/2021  . SPEP with reflex to IFE    Standing Status:   Future    Number of Occurrences:   1    Standing Expiration Date:   10/26/2021  . Kappa/lambda light chains    Standing Status:   Standing    Number of Occurrences:   22    Standing Expiration Date:   10/26/2021  . UPEP/TP, 24-Hr Urine    Standing Status:   Future    Standing Expiration Date:   10/26/2021  . Lactate dehydrogenase    Standing Status:   Future    Number of Occurrences:   1    Standing Expiration Date:   10/26/2021  . Beta 2 microglobulin, serum  Standing Status:   Future    Number of Occurrences:   1    Standing Expiration Date:   10/26/2021     HISTORY OF PRESENTING ILLNESS:   Miguel Roach 80 y.o. male is here because of concern for amyloidosis.  Patient arrived to the appointment with his wife today.  He is a good historian.  He tells me that since 2016 he has had worsening heart issues for the past few years he has had worsening chronic kidney disease.  He most recently went to see his primary care physician who wondered if the idiopathic heart failure and chronic kidney disease  could be indeed related to amyloidosis and hence he recommended referral to hematology.  Patient however tells me that he has had uncontrolled hypertension for few years before he got on medication and he wondered if this could have contributed to his heart disease.  He now has AICD and he absolutely denies any symptoms.  He denies any leg swelling, chest pain, shortness of breath on exertion, dizziness or lightheadedness upon changing position, B symptoms etc.  He feels well, he is active and is able to do all his chores.  No change in bowel habits, urinary habits, difficulty swallowing, change in speech.  He in fact tells me that his creatinine has been pretty much stable for the past year or so.  No skin rashes, easy bruising or bleeding.  Overall he did not complain of any symptoms that are concerning for presence of systemic amyloidosis.  Review of systems as mentioned below and unremarkable.  REVIEW OF SYSTEMS:   Constitutional: Denies fevers, chills or abnormal night sweats Eyes: Denies blurriness of vision, double vision or watery eyes Ears, nose, mouth, throat, and face: Denies mucositis or sore throat Respiratory: Denies cough, dyspnea or wheezes Cardiovascular: Denies palpitation, chest discomfort or lower extremity swelling Gastrointestinal:  Denies nausea, heartburn or change in bowel habits Skin: Denies abnormal skin rashes Lymphatics: Denies new lymphadenopathy or easy bruising Neurological:Denies numbness, tingling or new weaknesses Behavioral/Psych: Mood is stable, no new changes  All other systems were reviewed with the patient and are negative.  MEDICAL HISTORY:  Past Medical History:  Diagnosis Date  . Adenomatous colon polyp 2011  . AICD (automatic cardioverter/defibrillator) present   . Anal fistula 2003  . Arthritis   . CHF (congestive heart failure) ([facility])   . CRI (chronic renal insufficiency)    creatinine back to normal  . Diverticulosis   . ED (erectile  dysfunction)   . Gout   . Headache   . Hypertension   . Internal hemorrhoids   . Liver lesion 2007  . Nonischemic cardiomyopathy ([facility])    EF 20-25% by echo 2016, At that time 40% RCA stenosis, distal 35% circumflex stenosis  . Renal cyst 2007   "I'm not familiar with that, but I follow up on my creatinine because my kidney function is low." 09/2016  . Sleep apnea    "had test; never followed thru w/getting mask" (11/25/2015)    SURGICAL HISTORY: Past Surgical History:  Procedure Laterality Date  . ANAL FISTULOTOMY  01/2002  . CARDIAC CATHETERIZATION N/A 04/16/2015   Procedure: Right/Left Heart Cath and Coronary Angiography;  Surgeon: Belva Crome, MD;  Location: [facility];  Service: Cardiovascular;  Laterality: N/A;  . EP IMPLANTABLE DEVICE N/A 11/24/2015   Procedure: ICD Implant;  Surgeon: Evans Lance, MD;  Location: [facility];  Service: Cardiovascular;  Laterality: N/A;  . ESOPHAGOGASTRODUODENOSCOPY (  EGD) WITH PROPOFOL N/A 10/02/2016   Procedure: ESOPHAGOGASTRODUODENOSCOPY (EGD) WITH PROPOFOL;  Surgeon: Ladene Artist, MD;  Location: [facility] ENDOSCOPY;  Service: Endoscopy;  Laterality: N/A;  . EUS N/A 10/19/2016   Procedure: UPPER ENDOSCOPIC ULTRASOUND (EUS) RADIAL;  Surgeon: Milus Banister, MD;  Location: [facility] ENDOSCOPY;  Service: Endoscopy;  Laterality: N/A;  . INSERTION OF ICD  11/24/2015  . TONSILLECTOMY  ~ 1950    SOCIAL HISTORY: Social History   Socioeconomic History  . Marital status: Married    Spouse name: [name]  . Number of children: 4  . Years of education: Not on file  . Highest education level: Not on file  Occupational History  . Occupation: Retired Environmental consultant  . Occupation: Retired Animal nutritionist  Tobacco Use  . Smoking status: Never Smoker  . Smokeless tobacco: Never Used  . Tobacco comment: "smoked briefly in my freshman year in college; purchased 1  pack of cigarettes; didn't finish that"  Vaping Use  . Vaping Use: Never used   Substance and Sexual Activity  . Alcohol use: No    Alcohol/week: 0.0 standard drinks    Comment: 11/25/2015 "used to drink a beer q now in then back in my 20s"  . Drug use: No  . Sexual activity: Not Currently  Other Topics Concern  . Not on file  Social History Narrative  . Not on file   Social Determinants of Health   Financial Resource Strain: Not on file  Food Insecurity: Not on file  Transportation Needs: Not on file  Physical Activity: Not on file  Stress: Not on file  Social Connections: Not on file  Intimate Partner Violence: Not on file    FAMILY HISTORY: Family History  Problem Relation Age of Onset  . Heart failure Mother 43  . Sickle cell anemia Brother   . Breast cancer Sister   . Diabetes Sister   . Diabetes Father        died at age 52  . Diabetes Sister   . Colon polyps Brother   . Clotting disorder Other        two daughters (inherited from his first wife)  . Colon cancer Neg Hx     ALLERGIES:  is allergic to benicar [olmesartan].  MEDICATIONS:  Current Outpatient Medications  Medication Sig Dispense Refill  . allopurinol (ZYLOPRIM) 100 MG tablet Take 2 tablets (200 mg total) by mouth daily. 180 tablet 3  . amiodarone (PACERONE) 100 MG tablet Take 1 tablet (100 mg total) by mouth daily. 30 tablet 2  . carvedilol (COREG) 6.25 MG tablet Take 1 tablet (6.25 mg total) by mouth 2 (two) times daily with a meal. Needs appt 180 tablet 3  . ferric citrate (AURYXIA) 1 GM 210 MG(Fe) tablet Take 65 mg by mouth daily.    . furosemide (LASIX) 40 MG tablet TAKE 2 TABS BY MOUTH EVERY MORNING AND 1 TAB EVERY EVENING ALTERNATING WITH 2 TABS TWICE A DAY. 315 tablet 3  . hydrALAZINE (APRESOLINE) 25 MG tablet TAKE 1/2 TABLET BY MOUTH EVERY 8 (EIGHT) HOURS 135 tablet 1  . isosorbide mononitrate (IMDUR) 30 MG 24 hr tablet TAKE 1 TABLET BY MOUTH EVERY DAY 90 tablet 3  . colchicine 0.6 MG tablet Take 0.6 mg by mouth daily as needed (gout flare up). (Patient not taking:  Reported on 10/26/2020)     No current facility-administered medications for this visit.     PHYSICAL EXAMINATION: ECOG PERFORMANCE STATUS: 0 - Asymptomatic  Vitals:  10/26/20 1058  BP: 116/63  Pulse: 70  Resp: 18  Temp: 97.8 F (36.6 C)  SpO2: 100%   Filed Weights   10/26/20 1058  Weight: 171 lb 9.6 oz (77.8 kg)    GENERAL:alert, no distress and comfortable SKIN: skin color, texture, turgor are normal, no rashes or significant lesions EYES: normal, conjunctiva are pink and non-injected, sclera clear OROPHARYNX:no exudate, no erythema and lips, buccal mucosa, and tongue normal.  No glossal megaly NECK: supple, thyroid normal size, non-tender, without nodularity LYMPH:  no palpable lymphadenopathy in the cervical, axillary or inguinal LUNGS: clear to auscultation and percussion with normal breathing effort HEART: regular rate & rhythm and no murmurs and no lower extremity edema.  No hepatosplenomegaly. ABDOMEN:abdomen soft, non-tender and normal bowel sounds Musculoskeletal:no cyanosis of digits and no clubbing. BLE without any edema. PSYCH: alert & oriented x 3 with fluent speech NEURO: no focal motor/sensory deficits  LABORATORY DATA:  I have reviewed the data as listed Lab Results  Component Value Date   WBC 7.5 10/26/2020   HGB 12.9 (L) 10/26/2020   HCT 37.9 (L) 10/26/2020   MCV 88.1 10/26/2020   PLT 139 (L) 10/26/2020     Chemistry      Component Value Date/Time   NA 146 (H) 10/26/2020 1156   NA 145 (H) 12/03/2017 1140   K 3.5 10/26/2020 1156   CL 108 10/26/2020 1156   CO2 28 10/26/2020 1156   BUN 57 (H) 10/26/2020 1156   BUN 44 (H) 12/03/2017 1140   CREATININE 4.39 (HH) 10/26/2020 1156   CREATININE 2.53 (H) 06/06/2016 1018      Component Value Date/Time   CALCIUM 9.4 10/26/2020 1156   ALKPHOS 74 10/26/2020 1156   AST 9 (L) 10/26/2020 1156   ALT 9 10/26/2020 1156   BILITOT 0.5 10/26/2020 1156     I have reviewed available labs.  Besides known  CKD and CHF, I didn't see any convincing evidence of systemic amyloidosis from ROS and PE today.  RADIOGRAPHIC STUDIES: I have personally reviewed the radiological images as listed and agreed with the findings in the report. No results found.  All questions were answered. The patient knows to call the clinic with any problems, questions or concerns. I spent 60 minutes counseling the patient face to face including review of his old records, H and P, counseling and coordination of care. We discussed about amyloidosis, types of amyloidosis and importance of treating light chain amyloidosis.   Benay Pike, MD 10/26/2020 1:17 PM

## 2020-10-26 ENCOUNTER — Inpatient Hospital Stay: Payer: Medicare PPO

## 2020-10-26 ENCOUNTER — Telehealth: Payer: Self-pay

## 2020-10-26 ENCOUNTER — Telehealth: Payer: Self-pay | Admitting: Hematology and Oncology

## 2020-10-26 ENCOUNTER — Encounter: Payer: Self-pay | Admitting: Hematology and Oncology

## 2020-10-26 ENCOUNTER — Inpatient Hospital Stay: Payer: Medicare PPO | Attending: Hematology and Oncology | Admitting: Hematology and Oncology

## 2020-10-26 ENCOUNTER — Other Ambulatory Visit: Payer: Self-pay

## 2020-10-26 VITALS — BP 116/63 | HR 70 | Temp 97.8°F | Resp 18 | Ht 71.0 in | Wt 171.6 lb

## 2020-10-26 DIAGNOSIS — N179 Acute kidney failure, unspecified: Secondary | ICD-10-CM

## 2020-10-26 DIAGNOSIS — Z8249 Family history of ischemic heart disease and other diseases of the circulatory system: Secondary | ICD-10-CM | POA: Insufficient documentation

## 2020-10-26 DIAGNOSIS — Z95810 Presence of automatic (implantable) cardiac defibrillator: Secondary | ICD-10-CM | POA: Diagnosis not present

## 2020-10-26 DIAGNOSIS — I130 Hypertensive heart and chronic kidney disease with heart failure and stage 1 through stage 4 chronic kidney disease, or unspecified chronic kidney disease: Secondary | ICD-10-CM | POA: Diagnosis not present

## 2020-10-26 DIAGNOSIS — E859 Amyloidosis, unspecified: Secondary | ICD-10-CM

## 2020-10-26 DIAGNOSIS — I509 Heart failure, unspecified: Secondary | ICD-10-CM | POA: Insufficient documentation

## 2020-10-26 DIAGNOSIS — G4730 Sleep apnea, unspecified: Secondary | ICD-10-CM | POA: Insufficient documentation

## 2020-10-26 DIAGNOSIS — Z803 Family history of malignant neoplasm of breast: Secondary | ICD-10-CM | POA: Insufficient documentation

## 2020-10-26 DIAGNOSIS — I2510 Atherosclerotic heart disease of native coronary artery without angina pectoris: Secondary | ICD-10-CM | POA: Diagnosis not present

## 2020-10-26 DIAGNOSIS — Z833 Family history of diabetes mellitus: Secondary | ICD-10-CM | POA: Insufficient documentation

## 2020-10-26 DIAGNOSIS — Z79899 Other long term (current) drug therapy: Secondary | ICD-10-CM | POA: Insufficient documentation

## 2020-10-26 DIAGNOSIS — N189 Chronic kidney disease, unspecified: Secondary | ICD-10-CM | POA: Insufficient documentation

## 2020-10-26 LAB — CBC WITH DIFFERENTIAL/PLATELET
Abs Immature Granulocytes: 0.02 10*3/uL (ref 0.00–0.07)
Basophils Absolute: 0 10*3/uL (ref 0.0–0.1)
Basophils Relative: 0 %
Eosinophils Absolute: 0.2 10*3/uL (ref 0.0–0.5)
Eosinophils Relative: 3 %
HCT: 37.9 % — ABNORMAL LOW (ref 39.0–52.0)
Hemoglobin: 12.9 g/dL — ABNORMAL LOW (ref 13.0–17.0)
Immature Granulocytes: 0 %
Lymphocytes Relative: 19 %
Lymphs Abs: 1.5 10*3/uL (ref 0.7–4.0)
MCH: 30 pg (ref 26.0–34.0)
MCHC: 34 g/dL (ref 30.0–36.0)
MCV: 88.1 fL (ref 80.0–100.0)
Monocytes Absolute: 0.6 10*3/uL (ref 0.1–1.0)
Monocytes Relative: 8 %
Neutro Abs: 5.2 10*3/uL (ref 1.7–7.7)
Neutrophils Relative %: 70 %
Platelets: 139 10*3/uL — ABNORMAL LOW (ref 150–400)
RBC: 4.3 MIL/uL (ref 4.22–5.81)
RDW: 15.1 % (ref 11.5–15.5)
WBC: 7.5 10*3/uL (ref 4.0–10.5)
nRBC: 0 % (ref 0.0–0.2)

## 2020-10-26 LAB — COMPREHENSIVE METABOLIC PANEL
ALT: 9 U/L (ref 0–44)
AST: 9 U/L — ABNORMAL LOW (ref 15–41)
Albumin: 3.8 g/dL (ref 3.5–5.0)
Alkaline Phosphatase: 74 U/L (ref 38–126)
Anion gap: 10 (ref 5–15)
BUN: 57 mg/dL — ABNORMAL HIGH (ref 8–23)
CO2: 28 mmol/L (ref 22–32)
Calcium: 9.4 mg/dL (ref 8.9–10.3)
Chloride: 108 mmol/L (ref 98–111)
Creatinine, Ser: 4.39 mg/dL (ref 0.61–1.24)
GFR, Estimated: 13 mL/min — ABNORMAL LOW (ref >60)
Glucose, Bld: 93 mg/dL (ref 70–99)
Potassium: 3.5 mmol/L (ref 3.5–5.1)
Sodium: 146 mmol/L — ABNORMAL HIGH (ref 135–145)
Total Bilirubin: 0.5 mg/dL (ref 0.3–1.2)
Total Protein: 6.9 g/dL (ref 6.5–8.1)

## 2020-10-26 LAB — LACTATE DEHYDROGENASE: LDH: 150 U/L (ref 98–192)

## 2020-10-26 NOTE — Telephone Encounter (Signed)
-----   Message from Benay Pike, MD sent at 10/26/2020  1:06 PM EST ----- Lab reported as critical creatinine. This appears to be very much close to baseline. Please call the patient and let him know of his results and he can FU with his nephrologist.  Thanks,

## 2020-10-26 NOTE — Telephone Encounter (Signed)
Scheduled appointment per 1/11 los. Spoke to patient who is aware of appointments date and time.

## 2020-10-26 NOTE — Telephone Encounter (Signed)
Lab called with Scr 4.39. Dr Chryl Heck make aware. No new orders at this time.

## 2020-10-26 NOTE — Telephone Encounter (Signed)
Patient has been made aware of the 4.39 creatinine lab result and was advised to contact his Nephrologist (Dr. Posey Pronto) to inform him. Pt verbalized understanding.

## 2020-10-27 LAB — BETA 2 MICROGLOBULIN, SERUM: Beta-2 Microglobulin: 6 mg/L — ABNORMAL HIGH (ref 0.6–2.4)

## 2020-10-27 LAB — KAPPA/LAMBDA LIGHT CHAINS
Kappa free light chain: 77.9 mg/L — ABNORMAL HIGH (ref 3.3–19.4)
Kappa, lambda light chain ratio: 1.61 (ref 0.26–1.65)
Lambda free light chains: 48.4 mg/L — ABNORMAL HIGH (ref 5.7–26.3)

## 2020-10-28 ENCOUNTER — Other Ambulatory Visit: Payer: Self-pay | Admitting: *Deleted

## 2020-10-28 ENCOUNTER — Other Ambulatory Visit: Payer: Self-pay

## 2020-10-28 ENCOUNTER — Ambulatory Visit (HOSPITAL_COMMUNITY)
Admission: RE | Admit: 2020-10-28 | Discharge: 2020-10-28 | Disposition: A | Discharge status: Home / Self Care | Payer: Medicare PPO | Source: Ambulatory Visit | Attending: Cardiology | Admitting: Cardiology

## 2020-10-28 ENCOUNTER — Encounter (HOSPITAL_COMMUNITY): Payer: Self-pay | Admitting: Cardiology

## 2020-10-28 VITALS — BP 110/64 | HR 64 | Wt 172.0 lb

## 2020-10-28 DIAGNOSIS — I493 Ventricular premature depolarization: Secondary | ICD-10-CM | POA: Diagnosis not present

## 2020-10-28 DIAGNOSIS — Z803 Family history of malignant neoplasm of breast: Secondary | ICD-10-CM | POA: Diagnosis not present

## 2020-10-28 DIAGNOSIS — I428 Other cardiomyopathies: Secondary | ICD-10-CM | POA: Insufficient documentation

## 2020-10-28 DIAGNOSIS — G4730 Sleep apnea, unspecified: Secondary | ICD-10-CM | POA: Diagnosis not present

## 2020-10-28 DIAGNOSIS — I2510 Atherosclerotic heart disease of native coronary artery without angina pectoris: Secondary | ICD-10-CM | POA: Insufficient documentation

## 2020-10-28 DIAGNOSIS — G4733 Obstructive sleep apnea (adult) (pediatric): Secondary | ICD-10-CM | POA: Insufficient documentation

## 2020-10-28 DIAGNOSIS — I5022 Chronic systolic (congestive) heart failure: Secondary | ICD-10-CM | POA: Insufficient documentation

## 2020-10-28 DIAGNOSIS — Z79899 Other long term (current) drug therapy: Secondary | ICD-10-CM | POA: Insufficient documentation

## 2020-10-28 DIAGNOSIS — M109 Gout, unspecified: Secondary | ICD-10-CM | POA: Insufficient documentation

## 2020-10-28 DIAGNOSIS — I480 Paroxysmal atrial fibrillation: Secondary | ICD-10-CM | POA: Diagnosis not present

## 2020-10-28 DIAGNOSIS — I130 Hypertensive heart and chronic kidney disease with heart failure and stage 1 through stage 4 chronic kidney disease, or unspecified chronic kidney disease: Secondary | ICD-10-CM | POA: Diagnosis not present

## 2020-10-28 DIAGNOSIS — I509 Heart failure, unspecified: Secondary | ICD-10-CM | POA: Diagnosis not present

## 2020-10-28 DIAGNOSIS — N184 Chronic kidney disease, stage 4 (severe): Secondary | ICD-10-CM | POA: Diagnosis not present

## 2020-10-28 DIAGNOSIS — E859 Amyloidosis, unspecified: Secondary | ICD-10-CM

## 2020-10-28 DIAGNOSIS — Z9989 Dependence on other enabling machines and devices: Secondary | ICD-10-CM | POA: Insufficient documentation

## 2020-10-28 DIAGNOSIS — Z95810 Presence of automatic (implantable) cardiac defibrillator: Secondary | ICD-10-CM | POA: Insufficient documentation

## 2020-10-28 DIAGNOSIS — Z8249 Family history of ischemic heart disease and other diseases of the circulatory system: Secondary | ICD-10-CM | POA: Insufficient documentation

## 2020-10-28 DIAGNOSIS — N179 Acute kidney failure, unspecified: Secondary | ICD-10-CM

## 2020-10-28 DIAGNOSIS — N189 Chronic kidney disease, unspecified: Secondary | ICD-10-CM | POA: Diagnosis not present

## 2020-10-28 LAB — TSH: TSH: 2.161 u[IU]/mL (ref 0.350–4.500)

## 2020-10-28 NOTE — Patient Instructions (Signed)
Labs done today, your results will be available in MyChart, we will contact you for abnormal readings.  Your physician has requested that you have an echocardiogram. Echocardiography is a painless test that uses sound waves to create images of your heart. It provides your doctor with information about the size and shape of your heart and how well your heart's chambers and valves are working. This procedure takes approximately one hour. There are no restrictions for this procedure.  Your physician recommends that you schedule a follow-up appointment in: 4 months  If you have any questions or concerns before your next appointment please send [place] a message through [facility] or call our office at [phone].    TO LEAVE A MESSAGE FOR THE NURSE SELECT OPTION 2, PLEASE LEAVE A MESSAGE INCLUDING: . YOUR NAME . DATE OF BIRTH . CALL BACK NUMBER . REASON FOR CALL**this is important as we prioritize the call backs  [place] AS LONG AS YOU CALL BEFORE 4:00 PM  At the [facility], you and your health needs are our priority. As part of our continuing mission to provide you with exceptional heart care, we have created designated Provider Care Teams. These Care Teams include your primary Cardiologist (physician) and Advanced Practice Providers (APPs- Physician Assistants and Nurse Practitioners) who all work together to provide you with the care you need, when you need it.   You may see any of the following providers on your designated Care Team at your next follow up: Marland Kitchen Dr Glori Bickers . Dr [name] . Darrick Grinder, NP . Lyda Jester, PA . Audry Riles, PharmD   Please be sure to bring in all your medications bottles to every appointment.

## 2020-10-28 NOTE — Progress Notes (Signed)
Patient is being seen in the Advanced Heart Failure Clinic. VS, EKG, and device interrogations performed in the clinic. Dr Shulamit Donofrio is at home and seeing patients via telemedicine as he is in quarantine.   

## 2020-10-29 LAB — PROTEIN ELECTROPHORESIS, SERUM, WITH REFLEX
A/G Ratio: 1.4 (ref 0.7–1.7)
Albumin ELP: 3.9 g/dL (ref 2.9–4.4)
Alpha-1-Globulin: 0.2 g/dL (ref 0.0–0.4)
Alpha-2-Globulin: 0.6 g/dL (ref 0.4–1.0)
Beta Globulin: 0.8 g/dL (ref 0.7–1.3)
Gamma Globulin: 1.2 g/dL (ref 0.4–1.8)
Globulin, Total: 2.7 g/dL (ref 2.2–3.9)
M-Spike, %: 0.3 g/dL — ABNORMAL HIGH
SPEP Interpretation: 0
Total Protein ELP: 6.6 g/dL (ref 6.0–8.5)

## 2020-10-29 LAB — IMMUNOFIXATION REFLEX, SERUM
IgA: 198 mg/dL (ref 61–437)
IgG (Immunoglobin G), Serum: 1250 mg/dL (ref 603–1613)
IgM (Immunoglobulin M), Srm: 64 mg/dL (ref 15–143)

## 2020-10-29 NOTE — Progress Notes (Signed)
Heart Failure TeleHealth Note  Due to national recommendations of social distancing due to [place] 19, Audio/video telehealth visit is felt to be most appropriate for this patient at this time.  See MyChart message from today for patient consent regarding telehealth for [facility].  Date:  09/17/2019   ID:  Miguel Roach, DOB Jan 19, 1941, MRN [id number]  Location: Patient in office, physician at home due to quarantine.  Provider location: [facility] Type of Visit: Established patient   PCP:  Deland Pretty, MD  Cardiologist:  Dr. Aundra Dubin  Chief Complaint: Fatigue   History of Present Illness: Miguel Roach is a 80 y.o. male who presents via audio/video conferencing for a telehealth visit today.     he denies symptoms worrisome for COVID 19.   Patient has a history of chronic systolic CHF/nonischemic cardiomyopathy and CKD. He actually had an echo back in 2010 with EF 40-45% at the time.  He says that this really was not followed up upon by a cardiologist and that he did well for a number of years after that. In 6/16, he was admitted with acute systolic CHF.  Echo showed EF down to 20-25%.  Cardiac cath showed nonobstructive CAD.  His Lasix has been gradually increased.  This has brought his weight down and improved his breathing, but creatinine has risen. He had been on olmesartan but this was stopped because he thought it was causing cough.  He was then put on losartan, but this was stopped with rise in creatinine. Also intolerant to Bidil with orthostatic symptoms.  He was started on digoxin.  Echo in 12/16 showed persistently low EF, 15-20%.  Medtronic ICD was placed in 2/17.  Repeat echo 1/18 showed EF 25% with diffuse hypokinesis.   In 3/18, he had an episode of about 10 minutes atrial fibrillation noted on his ICD.  He also had 1 episode of VT terminated by an appropriate discharge.  He had push-mowed his grass and was very fatigued when he had the shock.  Since  then, he has had no further arrhythmias.   Echo in 6/19 showed EF 25-30% with mild LV dilation and mild-moderate AI, normal RV size with mildly decreased systolic function, IVC normal.   Admitted 1/27 - 11/17/2018 with hypotension and found to have UTI/sepsis. Treated on ABX and IVF. Eventually transitioned back to po diuretics with HF team following. 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 Noted to have increased NSVT and PVCs, started on amiodarone.  Zio patch in 6/20 showed occasional PVCs, burden low.   Echo in 8/20 showed EF 35%, severe LV dilation, normal RV size and systolic function, mild-moderate AI.     Patient has been doing well symptomatically.  Creatinine remains elevated > 4 but has been stable.  He is followed by nephrology.  No significant exertional dyspnea. He has not been going to the gym due to the pandemic but has been walking outside.  No chest pain.  No orthopnea/PND.  Weight down 3 lbs.   ECG (personally reviewed): NSR, 1st degree AVB, IVCD 146 msec.   Labs (6/16): BNP 1954, TSH normal Labs (7/16): K 4.1, creatinine 1.72 Labs (8/16): creatinine 2.3 Labs (9/16): digoxin 0.3, K 4, creatinine 2.14, SPEP negative, UPEP negative Labs (10/16): K 4.6, creatinine 1.62 Labs (11/16): K 4.4, creatinine 2.33, digoxin 0.5 Labs (12/16): creatinine 2.1 (nephrology) Labs (1/17): digoxin 0.6, uric acid 10.4 Labs (2/17): K 4, creatinine 2.13, hgb 11.6 Labs (5/17): K 3.8, creatinine 2.22, digoxin 0.7 Labs (  9/17): K 4.4, creatinine 2.88, LFTs normal, digoxin 1.0, hgb 11.8, LDL 88, HDL 50 Labs (1/18): digoxin 0.5 Labs (3/18): K 4.4, creatinine 3.17 Labs (4/18): K 4.3, creatinine 3.39, digoxin 0.6 Labs (10/18): K 4.5, creatinine 3.65, digoxin 0.4 Labs (11/18): K 4, creatinine 3.24, digoxin 0.6 Labs (12/18): digoxin < 0.2 Labs (1/19): K 3.8, creatinine 3.15 Labs (2/19): K 4.0, creatinine 3.23 Labs (3/19): digoxin 0.4, K 3.8, creatinine 3.19 Labs (6/19): digoxin 0.3, K 3.9, creatinine  3.12 Labs (9/19): K 3.9, creatinine 3.58, digoxin 0.3, LDL 91 Labs (3/20): K 4, creatinine 3.71 Labs (6/20): K 3.9, creatinine 3.94, LFTs normal, TSH normal Labs (8/20): K 3.7, creatinine 4.08, LFTs normal, TSH normal Labs (1/22): K 3.5, creatinine 4.34, LFTs normal  PMH: 1. CKD: Stage IV.  Follows with Dr Posey Pronto.  2. Gout 3. Chronic systolic CHF: Nonischemic cardiomyopathy.  EF 40-45% in 2010.  Echo (6/16) with EF 20-25%, severe LV dilation, mild MR, mild AI, moderate LAE.  LHC/RHC (6/16) with nonobstructive CAD; mean RA 2, RV 48/1, PA 47/18, mean PCWP 12, CI 3.2.  CPX (10/16) with peak VO2 13.1 (50% predicted), VE/VCO2 slope 39.3, RER 1.22 => moderate to severe functional limitation.  Echo (12/16) with EF 15-20%, severe LV dilation, diffuse hypokinesis, moderate AI, mild MR, normal RV size and systolic function. Medtronic ICD.  - CPX (11/17): Submaximal with RER 0.95, VE/VCO2 slope 27, peak VO2 18.2.  Suspect no more than mild HF limitation.  - Echo (1/18): EF 25%, diffuse hypokinesis, normal RV size and systolic function, mild AI.  - Echo (6/19): EF 25-30% with mild LV dilation and mild-moderate AI, normal RV size with mildly decreased systolic function, IVC normal.  - Echo (8/20): EF 35%, severe LV dilation, normal RV size and systolic function, mild-moderate AI.  4. ACEI cough.  5. Pleural effusion: Thoracentesis on right in 8/16.  It was a transudate, likely due to CHF.  6. OSA: To start Bipap.  7. Esophageal duplication cyst.  8. High resolution CT chest 12/17: No evidence for interstitial lung disease.  9. Atrial fibrillation: Paroxysmal, 1 10 minute episode in 3/18.  10. VT: ICD discharge 3/18.  11. 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  - Zio patch in 6/20: occasional PVCs, low burden overall.   SH: Married, retired Pharmacist, hospital, never smoked, no ETOH.   FH: Mother with "weak heart." Brother with sickle cell anemia.   ROS: All systems reviewed and negative except as per HPI.   Current  Outpatient Medications  Medication Sig Dispense Refill  . allopurinol (ZYLOPRIM) 100 MG tablet Take 2 tablets (200 mg total) by mouth daily. 180 tablet 3  . amiodarone (PACERONE) 100 MG tablet Take 1 tablet (100 mg total) by mouth daily. 30 tablet 2  . carvedilol (COREG) 6.25 MG tablet Take 1 tablet (6.25 mg total) by mouth 2 (two) times daily with a meal. Needs appt 180 tablet 3  . colchicine 0.6 MG tablet Take 0.6 mg by mouth daily as needed (gout flare up).    . ferrous sulfate 324 MG TBEC Take 324 mg by mouth daily.    . furosemide (LASIX) 40 MG tablet TAKE 2 TABS BY MOUTH EVERY MORNING AND 1 TAB EVERY EVENING ALTERNATING WITH 2 TABS TWICE A DAY. 315 tablet 3  . hydrALAZINE (APRESOLINE) 25 MG tablet TAKE 1/2 TABLET BY MOUTH EVERY 8 (EIGHT) HOURS 135 tablet 1  . isosorbide mononitrate (IMDUR) 30 MG 24 hr tablet TAKE 1 TABLET BY MOUTH EVERY DAY 90 tablet 3   No  current facility-administered medications for this encounter.   Exam:  (Video/Tele Health Call; Exam is subjective and or/visual.) General:  Speaks in full sentences. No resp difficulty. Lungs: Normal respiratory effort with conversation.  Neck: No JVD Abdomen: Non-distended per patient report Extremities: No edema.  Neuro: Alert & oriented x 3.   Assessment/Plan: 1. Chronic systolic CHF: Nonischemic cardiomyopathy.  CO preserved on RHC in 6/16, but 10/16 CPX showed moderate to severe functional impairment (seems worse than his symptoms would suggest).  However, repeat CPX in 11/17, though submaximal, suggested no more than mild HF limitation. No significant CAD with coronary angiography in 6/16.  No hx heavy ETOH or drug abuse. No marked HTN.  Negative SPEP/UPEP.  Possible prior myocarditis.  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  He now has Medtronic ICD.  Echo in 6/19 showed EF 25-30% (stable) with mild-moderate AI and mildly decreased RV systolic function.  Echo in 8/20 showed EF mildly higher  at 35%, severe LV dilation, normal RV, mild-moderate AI.  NYHA class II.  He does not appear volume overloaded by virtual exam and weight is stable.   - Continue alternating Lasix 80 mg bid with Lasix 80 qam/40 qpm. Recent BMET with stable creatinine.  - Off spironolactone with elevated creatinine.    - Continue Coreg 6.25 mg bid. - Hold off on ACEI/ARB/Entresto at this time with elevated creatinine.  - Continue hydralazine 12.5 mg tid and Imdur 30 mg daily, He gets dizzy when we try to increase hydralazine.   - GFR too low for SGLT2 inhibitor - He is off digoxin with rising creatinine.  - Wider QRS on ECG today with IVCD (not true LBBB).  Can review with EP whether ICD upgrade would be helpful.  - I will arrange for repeat echo to reassess LV function.  2. CKD: Stage IV.  Creatinine has been up to 4 recently.  He follows with Dr Posey Pronto every 3 months. 3. OSA: Bipap. 4. Gout: Gout controlled on allopurinol. 5. VT/PVCs: Appropriate ICD discharge in 3/18, no recurrence. He is now on amiodarone due to very frequent PVCs. Zio patch in 6/20 showed low burden of PVCs.  - Continue Coreg - Continue amiodarone 100 mg daily. Recent normal LFTs, will check TSH.  He will need regular eye exams.  6. Atrial fibrillation: Paroxysmal.  Very short episodes only noted by device interrogation, none recently.  He has had no prolonged or symptomatic atrial fibrillation (data for anticoagulating short runs of atrial fibrillation is not strong), so will hold off on anticoagulation.  - If he develops prolonged or symptomatic atrial fibrillation, would anticoagulate.    COVID screen The patient does not have any symptoms that suggest any further testing/ screening at this time.  Social distancing reinforced today.  Patient Risk: After full review of this patients clinical status, I feel that they are at moderate risk for cardiac decompensation at this time.  Relevant cardiac medications were reviewed at length with  the patient today. The patient does not have concerns regarding their medications at this time.   Recommended follow-up:  4 months.   Today, I have spent 15 minutes with the patient with telehealth technology discussing the above issues .    Signed, [name], MD  10/29/2020  [facility] [address] [facility] and [place] [address] [phone] (office) [phone] (fax)

## 2020-11-02 LAB — UPEP/TP, 24-HR URINE
Albumin, U: 0 %
Alpha 1, Urine: 0 %
Alpha 2, Urine: 0 %
Beta, Urine: 0 %
Gamma Globulin, Urine: 0 %
Total Protein, Urine-Ur/day: 286 mg/24 hr — ABNORMAL HIGH (ref 30–150)
Total Protein, Urine: 16.8 mg/dL
Total Volume: 1700

## 2020-11-04 ENCOUNTER — Telehealth: Payer: Self-pay | Admitting: Hematology and Oncology

## 2020-11-04 NOTE — Telephone Encounter (Signed)
Rescheduled appointment per provider on call schedule. Spoke to patient who is aware of updated appointment date and time.

## 2020-11-10 ENCOUNTER — Inpatient Hospital Stay: Payer: Medicare PPO | Admitting: Hematology and Oncology

## 2020-11-16 ENCOUNTER — Encounter: Payer: Self-pay | Admitting: Hematology and Oncology

## 2020-11-16 ENCOUNTER — Inpatient Hospital Stay: Payer: Medicare PPO | Attending: Hematology and Oncology | Admitting: Hematology and Oncology

## 2020-11-16 DIAGNOSIS — D539 Nutritional anemia, unspecified: Secondary | ICD-10-CM | POA: Diagnosis not present

## 2020-11-16 DIAGNOSIS — D472 Monoclonal gammopathy: Secondary | ICD-10-CM

## 2020-11-16 NOTE — Progress Notes (Signed)
[facility] NOTE  Patient Care Team: Deland Pretty, MD as PCP - General (Internal Medicine) Larey Dresser, MD as PCP - Advanced Heart Failure (Cardiology)  CHIEF COMPLAINTS/PURPOSE OF CONSULTATION:  Concern for amyloidosis.   ASSESSMENT & PLAN:  No problem-specific Assessment & Plan notes found for this encounter.  This is a very pleasant 80 year old male patient with past medical history significant for congestive heart failure, chronic kidney disease, hypertension, gout referred to hematology for evaluation of possible amyloidosis given his unexplained congestive heart failure as well as worsening chronic kidney disease.  [name] arrived to the appointment today with his wife.  He absolutely denies any complaints for me.  He denies any B symptoms, skin rash, easy bruising, abdominal pain, change in bowel habits or change in urinary habits.  He tells me that he has had hypertension for a while before he got on medication and he wondered if this could have contributed to his heart disease. He has had chronic kidney disease which has slowly progressed over the past few years but according to the patient, this has remained quite stable in the past year.  Physical examination quite unremarkable, no bilateral lower extremity edema, plus mildly, hepatosplenomegaly, lymphadenopathy or large bruises.  At this time given the chronicity of the symptoms, I have less suspicion for systemic amyloidosis but I agree with the evaluation for systemic amyloidosis on his blood.  I ordered CBC, SPEP, kappa lambda ratio, UPEP, beta-2 microglobulin which could be high given his chronic kidney disease, LDH.   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 Pt scheduled for ECHO on 11/23/2020. 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 IF imaging of the heart suggests amyloid, then I suggested he contact me immediately. He is agreeable  to the FU.  All his questions were answered to the best of my knowledge. Thank you for consulting [place] in the care of this patient.  Please not hesitate to contact us with any additional questions or concerns.  No orders of the defined types were placed in this encounter.   HISTORY OF PRESENTING ILLNESS:   Miguel Roach 80 y.o. male is here because of concern for amyloidosis.  Patient arrived to the appointment with his wife today.  He is a good historian.  He tells me that since 2016 he has had worsening heart issues for the past few years he has had worsening chronic kidney disease.  He most recently went to see his primary care physician who wondered if the idiopathic heart failure and chronic kidney disease could be indeed related to amyloidosis and hence he recommended referral to hematology.  Patient however tells me that he has had uncontrolled hypertension for few years before he got on medication and he wondered if this could have contributed to his heart disease.  He now has AICD and he absolutely denies any symptoms.  He denies any leg swelling, chest pain, shortness of breath on exertion, dizziness or lightheadedness upon changing position, B symptoms etc.  He feels well, he is active and is able to do all his chores.  No change in bowel habits, urinary habits, difficulty swallowing, change in speech.  He in fact tells me that his creatinine has been pretty much stable for the past year or so.  No skin rashes, easy bruising or bleeding.  Overall he did not complain of any symptoms that are concerning for presence of  systemic amyloidosis.  Review of systems as mentioned below and unremarkable.  Interim History  He denies any new complaints. He says he will be getting an ECHO soon. He will be following with Dr Posey Pronto for his CKD.  REVIEW OF SYSTEMS:   Constitutional: Denies fevers, chills or abnormal night sweats Eyes: Denies blurriness of vision, double vision or watery eyes Ears, nose,  mouth, throat, and face: Denies mucositis or sore throat Respiratory: Denies cough, dyspnea or wheezes Cardiovascular: Denies palpitation, chest discomfort or lower extremity swelling Gastrointestinal:  Denies nausea, heartburn or change in bowel habits Skin: Denies abnormal skin rashes Lymphatics: Denies new lymphadenopathy or easy bruising Neurological:Denies numbness, tingling or new weaknesses Behavioral/Psych: Mood is stable, no new changes  All other systems were reviewed with the patient and are negative.  MEDICAL HISTORY:  Past Medical History:  Diagnosis Date   Adenomatous colon polyp 2011   AICD (automatic cardioverter/defibrillator) present    Anal fistula 2003   Arthritis    CHF (congestive heart failure) (HCC)    CRI (chronic renal insufficiency)    creatinine back to normal   Diverticulosis    ED (erectile dysfunction)    Gout    Headache    Hypertension    Internal hemorrhoids    Liver lesion 2007   Nonischemic cardiomyopathy ([place])    EF 20-25% by echo 2016, At that time 40% RCA stenosis, distal 35% circumflex stenosis   Renal cyst 2007   "I'm not familiar with that, but I follow up on my creatinine because my kidney function is low." 09/2016   Sleep apnea    "had test; never followed thru w/getting mask" (11/25/2015)    SURGICAL HISTORY: Past Surgical History:  Procedure Laterality Date   ANAL FISTULOTOMY  01/2002   CARDIAC CATHETERIZATION N/A 04/16/2015   Procedure: Right/Left Heart Cath and Coronary Angiography;  Surgeon: Belva Crome, MD;  Location: [facility];  Service: Cardiovascular;  Laterality: N/A;   EP IMPLANTABLE DEVICE N/A 11/24/2015   Procedure: ICD Implant;  Surgeon: Evans Lance, MD;  Location: [facility];  Service: Cardiovascular;  Laterality: N/A;   ESOPHAGOGASTRODUODENOSCOPY (EGD) WITH PROPOFOL N/A 10/02/2016   Procedure: ESOPHAGOGASTRODUODENOSCOPY (EGD) WITH PROPOFOL;  Surgeon: Ladene Artist, MD;   Location: [facility];  Service: Endoscopy;  Laterality: N/A;   EUS N/A 10/19/2016   Procedure: UPPER ENDOSCOPIC ULTRASOUND (EUS) RADIAL;  Surgeon: Milus Banister, MD;  Location: [facility] ENDOSCOPY;  Service: Endoscopy;  Laterality: N/A;   INSERTION OF ICD  11/24/2015   TONSILLECTOMY  ~ 1950    SOCIAL HISTORY: Social History   Socioeconomic History   Marital status: Married    Spouse name: [name]   Number of children: 4   Years of education: Not on file   Highest education level: Not on file  Occupational History   Occupation: Retired Environmental consultant   Occupation: Retired Animal nutritionist  Tobacco Use   Smoking status: Never Smoker   Smokeless tobacco: Never Used   Tobacco comment: "smoked briefly in my freshman year in college; purchased 1  pack of cigarettes; didn't finish that"  Vaping Use   Vaping Use: Never used  Substance and Sexual Activity   Alcohol use: No    Alcohol/week: 0.0 standard drinks    Comment: 11/25/2015 "used to drink a beer q now in then back in my 20s"   Drug use: No   Sexual activity: Not Currently  Other Topics Concern   Not on file  Social History Narrative   Not on file   Social Determinants of Health   Financial Resource Strain: Not on file  Food Insecurity: Not on file  Transportation Needs: Not on file  Physical Activity: Not on file  Stress: Not on file  Social Connections: Not on file  Intimate Partner Violence: Not on file    FAMILY HISTORY: Family History  Problem Relation Age of Onset   Heart failure Mother 41   Sickle cell anemia Brother    Breast cancer Sister    Diabetes Sister    Diabetes Father        died at age 6   Diabetes Sister    Colon polyps Brother    Clotting disorder Other        two daughters (inherited from his first wife)   Colon cancer Neg Hx     ALLERGIES:  is allergic to benicar [olmesartan].  MEDICATIONS:  Current Outpatient Medications  Medication Sig Dispense Refill    allopurinol (ZYLOPRIM) 100 MG tablet Take 2 tablets (200 mg total) by mouth daily. 180 tablet 3   amiodarone (PACERONE) 100 MG tablet Take 1 tablet (100 mg total) by mouth daily. 30 tablet 2   carvedilol (COREG) 6.25 MG tablet Take 1 tablet (6.25 mg total) by mouth 2 (two) times daily with a meal. Needs appt 180 tablet 3   colchicine 0.6 MG tablet Take 0.6 mg by mouth daily as needed (gout flare up).     ferrous sulfate 324 MG TBEC Take 324 mg by mouth daily.     furosemide (LASIX) 40 MG tablet TAKE 2 TABS BY MOUTH EVERY MORNING AND 1 TAB EVERY EVENING ALTERNATING WITH 2 TABS TWICE A DAY. 315 tablet 3   hydrALAZINE (APRESOLINE) 25 MG tablet TAKE 1/2 TABLET BY MOUTH EVERY 8 (EIGHT) HOURS 135 tablet 1   isosorbide mononitrate (IMDUR) 30 MG 24 hr tablet TAKE 1 TABLET BY MOUTH EVERY DAY 90 tablet 3   No current facility-administered medications for this visit.     PHYSICAL EXAMINATION: ECOG PERFORMANCE STATUS: 0 - Asymptomatic  There were no vitals filed for this visit. There were no vitals filed for this visit.  PE deferred, telephone visit.  LABORATORY DATA:  I have reviewed the data as listed Lab Results  Component Value Date   WBC 7.5 10/26/2020   HGB 12.9 (L) 10/26/2020   HCT 37.9 (L) 10/26/2020   MCV 88.1 10/26/2020   PLT 139 (L) 10/26/2020     Chemistry      Component Value Date/Time   NA 146 (H) 10/26/2020 1156   NA 145 (H) 12/03/2017 1140   K 3.5 10/26/2020 1156   CL 108 10/26/2020 1156   CO2 28 10/26/2020 1156   BUN 57 (H) 10/26/2020 1156   BUN 44 (H) 12/03/2017 1140   CREATININE 4.39 (HH) 10/26/2020 1156   CREATININE 2.53 (H) 06/06/2016 1018      Component Value Date/Time   CALCIUM 9.4 10/26/2020 1156   ALKPHOS 74 10/26/2020 1156   AST 9 (L) 10/26/2020 1156   ALT 9 10/26/2020 1156   BILITOT 0.5 10/26/2020 1156     I have reviewed available labs. SPEP showed Ig G lambda monoclonal protein K/L ratio normal.  RADIOGRAPHIC STUDIES: I have personally  reviewed the radiological images as listed and agreed with the findings in the report. No results found.  All questions were answered. The patient knows to call the clinic with any problems, questions or concerns. I spent 20  minutes counseling the patient face to face including review of his labs, H and P, counseling and coordination of care  I connected with  Miguel Roach on 11/16/20 by telephone application and verified that I am speaking with the correct person using two identifiers.   I discussed the limitations of evaluation and management by telemedicine. The patient expressed understanding and agreed to proceed.     Benay Pike, MD 11/16/2020 2:24 PM

## 2020-11-17 DIAGNOSIS — N185 Chronic kidney disease, stage 5: Secondary | ICD-10-CM | POA: Diagnosis not present

## 2020-11-22 ENCOUNTER — Ambulatory Visit (INDEPENDENT_AMBULATORY_CARE_PROVIDER_SITE_OTHER): Payer: Medicare PPO

## 2020-11-22 DIAGNOSIS — I5022 Chronic systolic (congestive) heart failure: Secondary | ICD-10-CM | POA: Diagnosis not present

## 2020-11-22 DIAGNOSIS — Z95810 Presence of automatic (implantable) cardiac defibrillator: Secondary | ICD-10-CM

## 2020-11-23 ENCOUNTER — Other Ambulatory Visit: Payer: Self-pay

## 2020-11-23 ENCOUNTER — Ambulatory Visit (HOSPITAL_COMMUNITY)
Admission: RE | Admit: 2020-11-23 | Discharge: 2020-11-23 | Disposition: A | Discharge status: Home / Self Care | Payer: Medicare PPO | Source: Ambulatory Visit | Attending: Internal Medicine | Admitting: Internal Medicine

## 2020-11-23 DIAGNOSIS — N189 Chronic kidney disease, unspecified: Secondary | ICD-10-CM | POA: Diagnosis not present

## 2020-11-23 DIAGNOSIS — I351 Nonrheumatic aortic (valve) insufficiency: Secondary | ICD-10-CM | POA: Diagnosis not present

## 2020-11-23 DIAGNOSIS — I5022 Chronic systolic (congestive) heart failure: Secondary | ICD-10-CM | POA: Diagnosis not present

## 2020-11-23 DIAGNOSIS — Z95810 Presence of automatic (implantable) cardiac defibrillator: Secondary | ICD-10-CM | POA: Diagnosis not present

## 2020-11-23 LAB — ECHOCARDIOGRAM COMPLETE
Calc EF: 35.3 %
P 1/2 time: 571 msec
S' Lateral: 5.6 cm
Single Plane A2C EF: 37.9 %
Single Plane A4C EF: 29.5 %

## 2020-11-23 NOTE — Progress Notes (Signed)
  Echocardiogram 2D Echocardiogram has been performed.  Miguel Roach 11/23/2020, 9:36 AM

## 2020-11-25 DIAGNOSIS — D631 Anemia in chronic kidney disease: Secondary | ICD-10-CM | POA: Diagnosis not present

## 2020-11-25 DIAGNOSIS — N2581 Secondary hyperparathyroidism of renal origin: Secondary | ICD-10-CM | POA: Diagnosis not present

## 2020-11-25 DIAGNOSIS — N185 Chronic kidney disease, stage 5: Secondary | ICD-10-CM | POA: Diagnosis not present

## 2020-11-25 DIAGNOSIS — I120 Hypertensive chronic kidney disease with stage 5 chronic kidney disease or end stage renal disease: Secondary | ICD-10-CM | POA: Diagnosis not present

## 2020-11-26 ENCOUNTER — Telehealth (HOSPITAL_COMMUNITY): Payer: Self-pay

## 2020-11-26 NOTE — Progress Notes (Signed)
EPIC Encounter for ICM Monitoring  Patient Name: Miguel Roach is a 80 y.o. male Date: 11/26/2020 Primary Care Physican: Deland Pretty, MD Primary [place] Electrophysiologist: Lovena Le Nephrologist: [facility](every 3 months) 2/11/2022Weight:170lbs   Spoke with patient and reports feeling well at this time.  Denies fluid symptoms.    Optivol thoracic impedancesuggestingnormalfluid levels.   Prescribed:Furosemide 40 mgtake2 tablets (80 mgtotal)twice a day alternating with 80 mg AM and 40 mg PM.  Labs: 10/26/2020 Creatinine 4.39, BUN 57, Potassium 3.5, Sodium 146, GFR 13 09/19/2019 Creatinine 4.07, BUN 45, Potassium 3.6, Sodium 143, GFR 13-15 06/16/2019 Creatinine 4.08, BUN 46, Potassium 3.7, Sodium 141, GFR 13-15 03/21/2019 Creatinine3.94, BUN44, Potassium3.9, Sodium142, [id number] 12/19/2018 Creatinine3.71, BUN50, Potassium4.0, Sodium140, BHA19-37 12/12/2018 Creatinine 4.16, BUN 60, Potassium 4.2, Sodium 139, GFR 13-15 A complete set of results can be found in Results Review.  Recommendations: No changes and encouraged to call if experiencing any fluid symptoms.  Follow-up plan: ICM clinic phone appointment on3/14/2022. 91 day device clinic remote transmission2/14/2022.   EP/Cardiology Office Visits: 03/01/2021 with Dr Aundra Dubin  Copy of ICM check sent to Dr.Taylor.  3 month ICM trend: 11/22/2020.    1 Year ICM trend:       Rosalene Billings, RN 11/26/2020 2:18 PM

## 2020-11-26 NOTE — Telephone Encounter (Signed)
Malena Edman, RN  11/26/2020 8:23 AM EST Back to Top     Left message to return call. Letter mailed to address on file   Malena Edman, RN  11/24/2020 1:45 PM EST      Left message to return call   Malena Edman, RN  11/23/2020 12:44 PM EST      Left message to return call

## 2020-11-26 NOTE — Telephone Encounter (Signed)
-----   Message from Larey Dresser, MD sent at 11/23/2020 11:46 AM EST ----- Stable low EF 25-30%

## 2020-11-29 ENCOUNTER — Ambulatory Visit (INDEPENDENT_AMBULATORY_CARE_PROVIDER_SITE_OTHER): Payer: Medicare PPO

## 2020-11-29 DIAGNOSIS — I420 Dilated cardiomyopathy: Secondary | ICD-10-CM

## 2020-11-29 LAB — CUP PACEART REMOTE DEVICE CHECK
Battery Remaining Longevity: 87 mo
Battery Voltage: 3 V
Brady Statistic RV Percent Paced: 0.21 %
Date Time Interrogation Session: 20220214001803
HighPow Impedance: 69 Ohm
Implantable Lead Implant Date: 20170208
Implantable Lead Location: 753860
Implantable Lead Model: 6935
Implantable Pulse Generator Implant Date: 20170208
Lead Channel Impedance Value: 304 Ohm
Lead Channel Impedance Value: 361 Ohm
Lead Channel Pacing Threshold Amplitude: 0.875 V
Lead Channel Pacing Threshold Pulse Width: 0.4 ms
Lead Channel Sensing Intrinsic Amplitude: 10.625 mV
Lead Channel Sensing Intrinsic Amplitude: 10.625 mV
Lead Channel Setting Pacing Amplitude: 2 V
Lead Channel Setting Pacing Pulse Width: 0.4 ms
Lead Channel Setting Sensing Sensitivity: 0.3 mV

## 2020-11-30 NOTE — Telephone Encounter (Signed)
Pt returned call and is aware of results. 

## 2020-12-06 NOTE — Progress Notes (Signed)
Remote ICD transmission.   

## 2020-12-08 ENCOUNTER — Other Ambulatory Visit (HOSPITAL_COMMUNITY): Payer: Self-pay | Admitting: Cardiology

## 2020-12-27 ENCOUNTER — Other Ambulatory Visit: Payer: Self-pay

## 2020-12-27 ENCOUNTER — Emergency Department (HOSPITAL_BASED_OUTPATIENT_CLINIC_OR_DEPARTMENT_OTHER)
Admission: EM | Admit: 2020-12-27 | Discharge: 2020-12-27 | Disposition: A | Discharge status: Home / Self Care | Payer: Medicare PPO | Attending: Emergency Medicine | Admitting: Emergency Medicine

## 2020-12-27 ENCOUNTER — Encounter (HOSPITAL_BASED_OUTPATIENT_CLINIC_OR_DEPARTMENT_OTHER): Payer: Self-pay

## 2020-12-27 ENCOUNTER — Emergency Department (HOSPITAL_BASED_OUTPATIENT_CLINIC_OR_DEPARTMENT_OTHER): Payer: Medicare PPO

## 2020-12-27 ENCOUNTER — Ambulatory Visit (INDEPENDENT_AMBULATORY_CARE_PROVIDER_SITE_OTHER): Payer: Medicare PPO

## 2020-12-27 DIAGNOSIS — Z79899 Other long term (current) drug therapy: Secondary | ICD-10-CM | POA: Diagnosis not present

## 2020-12-27 DIAGNOSIS — R7989 Other specified abnormal findings of blood chemistry: Secondary | ICD-10-CM | POA: Diagnosis not present

## 2020-12-27 DIAGNOSIS — Z95810 Presence of automatic (implantable) cardiac defibrillator: Secondary | ICD-10-CM | POA: Insufficient documentation

## 2020-12-27 DIAGNOSIS — I5022 Chronic systolic (congestive) heart failure: Secondary | ICD-10-CM | POA: Diagnosis not present

## 2020-12-27 DIAGNOSIS — Z20822 Contact with and (suspected) exposure to covid-19: Secondary | ICD-10-CM | POA: Insufficient documentation

## 2020-12-27 DIAGNOSIS — I10 Essential (primary) hypertension: Secondary | ICD-10-CM | POA: Diagnosis not present

## 2020-12-27 DIAGNOSIS — R42 Dizziness and giddiness: Secondary | ICD-10-CM | POA: Diagnosis not present

## 2020-12-27 DIAGNOSIS — J189 Pneumonia, unspecified organism: Secondary | ICD-10-CM | POA: Insufficient documentation

## 2020-12-27 DIAGNOSIS — I509 Heart failure, unspecified: Secondary | ICD-10-CM | POA: Diagnosis not present

## 2020-12-27 DIAGNOSIS — R000 Tachycardia, unspecified: Secondary | ICD-10-CM | POA: Diagnosis not present

## 2020-12-27 DIAGNOSIS — I130 Hypertensive heart and chronic kidney disease with heart failure and stage 1 through stage 4 chronic kidney disease, or unspecified chronic kidney disease: Secondary | ICD-10-CM | POA: Diagnosis not present

## 2020-12-27 DIAGNOSIS — N189 Chronic kidney disease, unspecified: Secondary | ICD-10-CM | POA: Insufficient documentation

## 2020-12-27 DIAGNOSIS — R6883 Chills (without fever): Secondary | ICD-10-CM | POA: Diagnosis present

## 2020-12-27 LAB — CBC WITH DIFFERENTIAL/PLATELET
Abs Immature Granulocytes: 0.03 10*3/uL (ref 0.00–0.07)
Basophils Absolute: 0 10*3/uL (ref 0.0–0.1)
Basophils Relative: 0 %
Eosinophils Absolute: 0 10*3/uL (ref 0.0–0.5)
Eosinophils Relative: 0 %
HCT: 35.6 % — ABNORMAL LOW (ref 39.0–52.0)
Hemoglobin: 12.5 g/dL — ABNORMAL LOW (ref 13.0–17.0)
Immature Granulocytes: 1 %
Lymphocytes Relative: 7 %
Lymphs Abs: 0.5 10*3/uL — ABNORMAL LOW (ref 0.7–4.0)
MCH: 29.8 pg (ref 26.0–34.0)
MCHC: 35.1 g/dL (ref 30.0–36.0)
MCV: 84.8 fL (ref 80.0–100.0)
Monocytes Absolute: 0.2 10*3/uL (ref 0.1–1.0)
Monocytes Relative: 3 %
Neutro Abs: 5.7 10*3/uL (ref 1.7–7.7)
Neutrophils Relative %: 89 %
Platelets: 97 10*3/uL — ABNORMAL LOW (ref 150–400)
RBC: 4.2 MIL/uL — ABNORMAL LOW (ref 4.22–5.81)
RDW: 14.4 % (ref 11.5–15.5)
Smear Review: DECREASED
WBC: 6.4 10*3/uL (ref 4.0–10.5)
nRBC: 0 % (ref 0.0–0.2)

## 2020-12-27 LAB — RESP PANEL BY RT-PCR (FLU A&B, COVID) ARPGX2
Influenza A by PCR: NEGATIVE
Influenza B by PCR: NEGATIVE
SARS Coronavirus 2 by RT PCR: NEGATIVE

## 2020-12-27 LAB — COMPREHENSIVE METABOLIC PANEL
ALT: 13 U/L (ref 0–44)
AST: 17 U/L (ref 15–41)
Albumin: 3.5 g/dL (ref 3.5–5.0)
Alkaline Phosphatase: 56 U/L (ref 38–126)
Anion gap: 13 (ref 5–15)
BUN: 49 mg/dL — ABNORMAL HIGH (ref 8–23)
CO2: 24 mmol/L (ref 22–32)
Calcium: 8.7 mg/dL — ABNORMAL LOW (ref 8.9–10.3)
Chloride: 103 mmol/L (ref 98–111)
Creatinine, Ser: 4.43 mg/dL — ABNORMAL HIGH (ref 0.61–1.24)
GFR, Estimated: 13 mL/min — ABNORMAL LOW (ref >60)
Glucose, Bld: 103 mg/dL — ABNORMAL HIGH (ref 70–99)
Potassium: 3.4 mmol/L — ABNORMAL LOW (ref 3.5–5.1)
Sodium: 140 mmol/L (ref 135–145)
Total Bilirubin: 1.2 mg/dL (ref 0.3–1.2)
Total Protein: 6.5 g/dL (ref 6.5–8.1)

## 2020-12-27 LAB — URINALYSIS, ROUTINE W REFLEX MICROSCOPIC
Bilirubin Urine: NEGATIVE
Glucose, UA: NEGATIVE mg/dL
Hgb urine dipstick: NEGATIVE
Ketones, ur: NEGATIVE mg/dL
Leukocytes,Ua: NEGATIVE
Nitrite: NEGATIVE
Protein, ur: NEGATIVE mg/dL
Specific Gravity, Urine: 1.015 (ref 1.005–1.030)
pH: 6.5 (ref 5.0–8.0)

## 2020-12-27 LAB — LACTIC ACID, PLASMA: Lactic Acid, Venous: 1.2 mmol/L (ref 0.5–1.9)

## 2020-12-27 LAB — PROTIME-INR
INR: 1.1 (ref 0.8–1.2)
Prothrombin Time: 13.4 seconds (ref 11.4–15.2)

## 2020-12-27 LAB — APTT: aPTT: 26 seconds (ref 24–36)

## 2020-12-27 MED ORDER — ACETAMINOPHEN 325 MG PO TABS
650.0000 mg | ORAL_TABLET | Freq: Once | ORAL | Status: AC
  Administered 2020-12-27: 650 mg via ORAL
  Filled 2020-12-27: qty 2

## 2020-12-27 MED ORDER — AMOXICILLIN-POT CLAVULANATE 875-125 MG PO TABS: 1.0000 | ORAL_TABLET | Freq: Two times a day (BID) | ORAL | 0 refills | Status: DC

## 2020-12-27 MED ORDER — SODIUM CHLORIDE 0.9 % IV SOLN
1.0000 g | Freq: Once | INTRAVENOUS | Status: AC
  Administered 2020-12-27: 1 g via INTRAVENOUS
  Filled 2020-12-27: qty 10

## 2020-12-27 MED ORDER — SODIUM CHLORIDE 0.9 % IV SOLN: INTRAVENOUS | Status: DC | PRN

## 2020-12-27 MED ORDER — SODIUM CHLORIDE 0.9 % IV SOLN
500.0000 mg | Freq: Once | INTRAVENOUS | Status: AC
  Administered 2020-12-27: 500 mg via INTRAVENOUS
  Filled 2020-12-27: qty 500

## 2020-12-27 MED ORDER — LACTATED RINGERS IV BOLUS (SEPSIS)
1000.0000 mL | Freq: Once | INTRAVENOUS | Status: AC
  Administered 2020-12-27: 1000 mL via INTRAVENOUS

## 2020-12-27 NOTE — ED Triage Notes (Addendum)
Pt c/o chills x today-"a little cough" x 2 weeks-slow gait to triage

## 2020-12-27 NOTE — ED Notes (Signed)
Pt reports dizziness and chills. Family member reports pt has been having trouble sleeping for a few days. Pt has fever upon arrival to ED. Denies sick contacts. Provider at bedside.

## 2020-12-27 NOTE — ED Provider Notes (Signed)
[facility] EMERGENCY DEPARTMENT Provider Note  CSN: [id number] Arrival date & time: 12/27/20 1608    History Chief Complaint  Patient presents with  . Chills    HPI  Miguel Roach is a 81 y.o. male with history of HTN, CHF, CKD presents for evaluation of sudden onset shaking chills and swimmy headedness a few hours ago. He has had a mild cough recently, worse at night, productive of clear sputum. He denies any chest pain, SOB, N/V/D, dysuria. No rashes or tick bites. He has had Covid vaccine x 3, flu and PNA vaccines. No known sick contacts.    Past Medical History:  Diagnosis Date  . Adenomatous colon polyp 2011  . AICD (automatic cardioverter/defibrillator) present   . Anal fistula 2003  . Arthritis   . CHF (congestive heart failure) ([place])   . CRI (chronic renal insufficiency)    creatinine back to normal  . Diverticulosis   . ED (erectile dysfunction)   . Gout   . Headache   . Hypertension   . Internal hemorrhoids   . Liver lesion 2007  . Nonischemic cardiomyopathy ([place])    EF 20-25% by echo 2016, At that time 40% RCA stenosis, distal 35% circumflex stenosis  . Renal cyst 2007   "I'm not familiar with that, but I follow up on my creatinine because my kidney function is low." 09/2016  . Sleep apnea    "had test; never followed thru w/getting mask" (11/25/2015)    Past Surgical History:  Procedure Laterality Date  . ANAL FISTULOTOMY  01/2002  . CARDIAC CATHETERIZATION N/A 04/16/2015   Procedure: Right/Left Heart Cath and Coronary Angiography;  Surgeon: Belva Crome, MD;  Location: [facility];  Service: Cardiovascular;  Laterality: N/A;  . EP IMPLANTABLE DEVICE N/A 11/24/2015   Procedure: ICD Implant;  Surgeon: Evans Lance, MD;  Location: [facility];  Service: Cardiovascular;  Laterality: N/A;  . ESOPHAGOGASTRODUODENOSCOPY (EGD) WITH PROPOFOL N/A 10/02/2016   Procedure: ESOPHAGOGASTRODUODENOSCOPY (EGD) WITH PROPOFOL;  Surgeon: Ladene Artist, MD;  Location: [facility] ENDOSCOPY;  Service: Endoscopy;  Laterality: N/A;  . EUS N/A 10/19/2016   Procedure: UPPER ENDOSCOPIC ULTRASOUND (EUS) RADIAL;  Surgeon: Milus Banister, MD;  Location: [facility] ENDOSCOPY;  Service: Endoscopy;  Laterality: N/A;  . INSERTION OF ICD  11/24/2015  . TONSILLECTOMY  ~ 1950    Family History  Problem Relation Age of Onset  . Heart failure Mother 46  . Sickle cell anemia Brother   . Breast cancer Sister   . Diabetes Sister   . Diabetes Father        died at age 73  . Diabetes Sister   . Colon polyps Brother   . Clotting disorder Other        two daughters (inherited from his first wife)  . Colon cancer Neg Hx     Social History   Tobacco Use  . Smoking status: Never Smoker  . Smokeless tobacco: Never Used  . Tobacco comment: "smoked briefly in my freshman year in college; purchased 1  pack of cigarettes; didn't finish that"  Vaping Use  . Vaping Use: Never used  Substance Use Topics  . Alcohol use: No    Alcohol/week: 0.0 standard drinks    Comment: 11/25/2015 "used to drink a beer q now in then back in my 20s"  . Drug use: No     Home Medications Prior to Admission medications   Medication Sig Start Date  End Date Taking? Authorizing Provider  amoxicillin-clavulanate (AUGMENTIN) 875-125 MG tablet Take 1 tablet by mouth every 12 (twelve) hours. 12/27/20  Yes Truddie Hidden, MD  allopurinol (ZYLOPRIM) 100 MG tablet Take 2 tablets (200 mg total) by mouth daily. 06/02/20   Larey Dresser, MD  amiodarone (PACERONE) 100 MG tablet TAKE 1 TABLET BY MOUTH EVERY DAY 12/08/20   Larey Dresser, MD  carvedilol (COREG) 6.25 MG tablet Take 1 tablet (6.25 mg total) by mouth 2 (two) times daily with a meal. Needs appt 04/05/20   Larey Dresser, MD  colchicine 0.6 MG tablet Take 0.6 mg by mouth daily as needed (gout flare up).    [provider]  ferrous sulfate 324 MG TBEC Take 324 mg by mouth daily.    [provider]  furosemide (LASIX) 40  MG tablet TAKE 2 TABS BY MOUTH EVERY MORNING AND 1 TAB EVERY EVENING ALTERNATING WITH 2 TABS TWICE A DAY. 04/05/20   Larey Dresser, MD  hydrALAZINE (APRESOLINE) 25 MG tablet TAKE 1/2 TABLET BY MOUTH EVERY 8 (EIGHT) HOURS 06/18/20   Larey Dresser, MD  isosorbide mononitrate (IMDUR) 30 MG 24 hr tablet TAKE 1 TABLET BY MOUTH EVERY DAY 02/02/20   Larey Dresser, MD     Allergies    Benicar [olmesartan]   Review of Systems   Review of Systems A comprehensive review of systems was completed and negative except as noted in HPI.    Physical Exam BP 112/72   Pulse 89   Temp 98.3 F (36.8 C) (Oral)   Resp 18   Ht 5\' 11"  (1.803 m)   Wt 77.1 kg   SpO2 96%   BMI 23.71 kg/m   Physical Exam Vitals and nursing note reviewed.  Constitutional:      Appearance: Normal appearance.  HENT:     Head: Normocephalic and atraumatic.     Nose: Nose normal.     Mouth/Throat:     Mouth: Mucous membranes are moist.  Eyes:     Extraocular Movements: Extraocular movements intact.     Conjunctiva/sclera: Conjunctivae normal.  Cardiovascular:     Rate and Rhythm: Tachycardia present.  Pulmonary:     Effort: Pulmonary effort is normal.     Breath sounds: Normal breath sounds. No wheezing, rhonchi or rales.  Abdominal:     General: Abdomen is flat.     Palpations: Abdomen is soft.     Tenderness: There is no abdominal tenderness.  Musculoskeletal:        General: No swelling. Normal range of motion.     Cervical back: Neck supple.  Skin:    General: Skin is warm and dry.     Findings: No rash.  Neurological:     General: No focal deficit present.     Mental Status: He is alert.  Psychiatric:        Mood and Affect: Mood normal.      ED Results / Procedures / Treatments   Labs (all labs ordered are listed, but only abnormal results are displayed) Labs Reviewed  COMPREHENSIVE METABOLIC PANEL - Abnormal; Notable for the following components:      Result Value   Potassium 3.4 (*)     Glucose, Bld 103 (*)    BUN 49 (*)    Creatinine, Ser 4.43 (*)    Calcium 8.7 (*)    GFR, Estimated 13 (*)    All other components within normal limits  CBC WITH DIFFERENTIAL/PLATELET -  Abnormal; Notable for the following components:   RBC 4.20 (*)    Hemoglobin 12.5 (*)    HCT 35.6 (*)    Platelets 97 (*)    Lymphs Abs 0.5 (*)    All other components within normal limits  RESP PANEL BY RT-PCR (FLU A&B, COVID) ARPGX2  URINE CULTURE  CULTURE, BLOOD (ROUTINE X 2)  CULTURE, BLOOD (ROUTINE X 2)  LACTIC ACID, PLASMA  PROTIME-INR  APTT  URINALYSIS, ROUTINE W REFLEX MICROSCOPIC  LACTIC ACID, PLASMA    EKG EKG Interpretation  Date/Time:  Monday December 27 2020 17:26:35 EDT Ventricular Rate:  97 PR Interval:    QRS Duration: 140 QT Interval:  381 QTC Calculation: 484 R Axis:   -81 Text Interpretation: Sinus rhythm Ventricular premature complex Borderline prolonged PR interval Nonspecific IVCD with LAD Probable anterolateral infarct, recent Baseline wander in lead(s) V1 Since last tracing Rate faster Confirmed by Calvert Cantor [phone]) on 12/27/2020 5:31:14 PM    Radiology DG Chest Port 1 View  Result Date: 12/27/2020 CLINICAL DATA:  Dizziness and chills EXAM: PORTABLE CHEST 1 VIEW COMPARISON:  Chest radiograph November 11, 2018 FINDINGS: Enlarged cardiac silhouette, likely accentuated by technique. Aortic arch calcifications. Left basilar hazy retrocardiac opacity. The visualized skeletal structures are unremarkable. IMPRESSION: Left basilar hazy retrocardiac opacity which may represent pneumonia versus atelectasis. Electronically Signed   By: Dahlia Bailiff MD   On: 12/27/2020 17:56    Procedures Procedures  Medications Ordered in the ED Medications  0.9 %  sodium chloride infusion ( Intravenous Stopped 12/27/20 2156)  lactated ringers bolus 1,000 mL (0 mLs Intravenous Stopped 12/27/20 1831)  acetaminophen (TYLENOL) tablet 650 mg (650 mg Oral Given 12/27/20 1730)  cefTRIAXone  (ROCEPHIN) 1 g in sodium chloride 0.9 % 100 mL IVPB ( Intravenous Stopped 12/27/20 1903)  azithromycin (ZITHROMAX) 500 mg in sodium chloride 0.9 % 250 mL IVPB ( Intravenous Stopped 12/27/20 2023)     MDM Rules/Calculators/A&P MDM Patient with fever and tachycardia, only having mild respiratory symptoms. No hypoxia or tachypnea on exam. Will give APAP for fever, check labs for source. LR bolus 1030mL but defer large volume bolus pending lactic acid. No signs of severe sepsis or shock at present, will hold off on Abx pending identification of a source, viral infection is high on the differential.  ED Course  I have reviewed the triage vital signs and the nursing notes.  Pertinent labs & imaging results that were available during my care of the patient were reviewed by me and considered in my medical decision making (see chart for details).  Clinical Course as of 12/27/20 2255  Mon Dec 27, 2020  1847 CBC with normal WBC. CMP has elevated creatinine about at baseline Coags and lactic acid are neg. UA clear. CXR with some haziness concerning for PNA which correlates with the patient's symptoms. He is feeling much better now.  [CS]  7939 Covid is neg.  [CS]  2145 Patient reports he still feels well, ambulates without difficult and eager to go home. Rx for Augmentin, advised close PCP follow up and RTED for any worsening.  [CS]    Clinical Course User Index [CS] Truddie Hidden, MD    Final Clinical Impression(s) / ED Diagnoses Final diagnoses:  Community acquired pneumonia, unspecified laterality    Rx / DC Orders ED Discharge Orders         Ordered    amoxicillin-clavulanate (AUGMENTIN) 875-125 MG tablet  Every 12 hours  12/27/20 2147           Truddie Hidden, MD 12/27/20 2255

## 2020-12-29 LAB — URINE CULTURE: Culture: NO GROWTH

## 2020-12-29 NOTE — Progress Notes (Signed)
EPIC Encounter for ICM Monitoring  Patient Name: Miguel Roach is a 80 y.o. male Date: 12/29/2020 Primary Care Physican: Deland Pretty, MD Primary [place] Electrophysiologist: Lovena Le Nephrologist: [facility](every 3 months) 3/16/2022Weight:165-166lbs   Spoke with patient. He reports he went to ER on 12/27/2020 and received dx of pneumonia.  He is on antibiotics.  Optivol thoracic impedancesuggestingnormalfluid level on transmission date but was suggesting possible fluid accumulation from 2/21-3/13.   Prescribed:Furosemide 40 mgtake2 tablets (80 mgtotal)twice a day alternating with 80 mg AM and 40 mg PM.  Labs: 12/27/2020 Creatinine 4.43, BUN 49, Potassium 3.4, Sodium 140, GFR 13 10/26/2020 Creatinine 4.39, BUN 57, Potassium 3.5, Sodium 146, GFR 13 09/19/2019 Creatinine 4.07, BUN 45, Potassium 3.6, Sodium 143, GFR 13-15 06/16/2019 Creatinine 4.08, BUN 46, Potassium 3.7, Sodium 141, GFR 13-15 03/21/2019 Creatinine3.94, BUN44, Potassium3.9, Sodium142, [id number] 12/19/2018 Creatinine3.71, BUN50, Potassium4.0, Sodium140, [id number] 12/12/2018 Creatinine 4.16, BUN 60, Potassium 4.2, Sodium 139, GFR 13-15 A complete set of results can be found in Results Review.  Recommendations: No changes and encouraged to call if experiencing any fluid symptoms.  Follow-up plan: ICM clinic phone appointment on 01/31/2021. 91 day device clinic remote transmission2/14/2022.   EP/Cardiology Office Visits: 03/01/2021 with Dr Aundra Dubin  Copy of ICM check sent to Dr.Taylor.  3 month ICM trend: 12/27/2020.    1 Year ICM trend:       Rosalene Billings, RN 12/29/2020 3:33 PM

## 2021-01-01 LAB — CULTURE, BLOOD (ROUTINE X 2)
Culture: NO GROWTH
Culture: NO GROWTH
Special Requests: ADEQUATE

## 2021-01-06 ENCOUNTER — Other Ambulatory Visit (HOSPITAL_COMMUNITY): Payer: Self-pay | Admitting: Cardiology

## 2021-01-17 ENCOUNTER — Other Ambulatory Visit (HOSPITAL_COMMUNITY): Payer: Self-pay | Admitting: Cardiology

## 2021-01-17 DIAGNOSIS — I5022 Chronic systolic (congestive) heart failure: Secondary | ICD-10-CM

## 2021-01-20 DIAGNOSIS — M1A00X0 Idiopathic chronic gout, unspecified site, without tophus (tophi): Secondary | ICD-10-CM | POA: Diagnosis not present

## 2021-01-20 DIAGNOSIS — Z09 Encounter for follow-up examination after completed treatment for conditions other than malignant neoplasm: Secondary | ICD-10-CM | POA: Diagnosis not present

## 2021-01-20 DIAGNOSIS — I10 Essential (primary) hypertension: Secondary | ICD-10-CM | POA: Diagnosis not present

## 2021-01-20 DIAGNOSIS — N184 Chronic kidney disease, stage 4 (severe): Secondary | ICD-10-CM | POA: Diagnosis not present

## 2021-01-20 DIAGNOSIS — I5022 Chronic systolic (congestive) heart failure: Secondary | ICD-10-CM | POA: Diagnosis not present

## 2021-01-31 ENCOUNTER — Ambulatory Visit (INDEPENDENT_AMBULATORY_CARE_PROVIDER_SITE_OTHER): Payer: Medicare PPO

## 2021-01-31 DIAGNOSIS — Z95810 Presence of automatic (implantable) cardiac defibrillator: Secondary | ICD-10-CM

## 2021-01-31 DIAGNOSIS — I5022 Chronic systolic (congestive) heart failure: Secondary | ICD-10-CM

## 2021-02-01 ENCOUNTER — Other Ambulatory Visit (HOSPITAL_COMMUNITY): Payer: Self-pay | Admitting: Cardiology

## 2021-02-03 DIAGNOSIS — M1A00X0 Idiopathic chronic gout, unspecified site, without tophus (tophi): Secondary | ICD-10-CM | POA: Diagnosis not present

## 2021-02-04 NOTE — Progress Notes (Signed)
EPIC Encounter for ICM Monitoring  Patient Name: Miguel Roach is a 80 y.o. male Date: 02/04/2021 Primary Care Physican: Deland Pretty, MD Primary Cardiologist:[place] Electrophysiologist: Lovena Le Nephrologist: [facility](every 3 months) 4/22/2022Weight:161lbs   Spoke with patient and reports feeling well at this time.  Denies fluid symptoms.  He received his 2nd booster shot  Optivol thoracic impedancesuggestingnormalfluid levels.  Prescribed:Furosemide 40 mgtake2 tablets (80 mgtotal)twice a day alternating with 80 mg AM and 40 mg PM.  Labs: 12/27/2020 Creatinine 4.43, BUN 49, Potassium 3.4, Sodium 140, GFR 13 10/26/2020 Creatinine 4.39, BUN 57, Potassium 3.5, Sodium 146, GFR 13 A complete set of results can be found in Results Review.  Recommendations:No changes and encouraged to call if experiencing any fluid symptoms.  Follow-up plan: ICM clinic phone appointment on 03/21/2021. 91 day device clinic remote transmission5/16/2022.   EP/Cardiology Office Visits: 03/01/2021 with Dr Aundra Dubin  Copy of ICM check sent to Dr.Taylor.  3 month ICM trend: 01/31/2021.    1 Year ICM trend:       Rosalene Billings, RN 02/04/2021 3:31 PM

## 2021-02-22 DIAGNOSIS — I120 Hypertensive chronic kidney disease with stage 5 chronic kidney disease or end stage renal disease: Secondary | ICD-10-CM | POA: Diagnosis not present

## 2021-02-22 DIAGNOSIS — N2581 Secondary hyperparathyroidism of renal origin: Secondary | ICD-10-CM | POA: Diagnosis not present

## 2021-02-22 DIAGNOSIS — D631 Anemia in chronic kidney disease: Secondary | ICD-10-CM | POA: Diagnosis not present

## 2021-02-22 DIAGNOSIS — N185 Chronic kidney disease, stage 5: Secondary | ICD-10-CM | POA: Diagnosis not present

## 2021-02-28 ENCOUNTER — Ambulatory Visit (INDEPENDENT_AMBULATORY_CARE_PROVIDER_SITE_OTHER): Payer: Medicare PPO

## 2021-02-28 DIAGNOSIS — I428 Other cardiomyopathies: Secondary | ICD-10-CM

## 2021-03-01 ENCOUNTER — Ambulatory Visit (HOSPITAL_COMMUNITY)
Admission: RE | Admit: 2021-03-01 | Discharge: 2021-03-01 | Disposition: A | Discharge status: Home / Self Care | Payer: Medicare PPO | Source: Ambulatory Visit | Attending: Cardiology | Admitting: Cardiology

## 2021-03-01 ENCOUNTER — Other Ambulatory Visit: Payer: Self-pay

## 2021-03-01 ENCOUNTER — Encounter (HOSPITAL_COMMUNITY): Payer: Self-pay | Admitting: Cardiology

## 2021-03-01 VITALS — BP 94/50 | HR 59 | Wt 164.0 lb

## 2021-03-01 DIAGNOSIS — M109 Gout, unspecified: Secondary | ICD-10-CM | POA: Insufficient documentation

## 2021-03-01 DIAGNOSIS — Z95810 Presence of automatic (implantable) cardiac defibrillator: Secondary | ICD-10-CM | POA: Insufficient documentation

## 2021-03-01 DIAGNOSIS — Z79899 Other long term (current) drug therapy: Secondary | ICD-10-CM | POA: Diagnosis not present

## 2021-03-01 DIAGNOSIS — Z8249 Family history of ischemic heart disease and other diseases of the circulatory system: Secondary | ICD-10-CM | POA: Diagnosis not present

## 2021-03-01 DIAGNOSIS — N184 Chronic kidney disease, stage 4 (severe): Secondary | ICD-10-CM | POA: Insufficient documentation

## 2021-03-01 DIAGNOSIS — I5022 Chronic systolic (congestive) heart failure: Secondary | ICD-10-CM | POA: Diagnosis not present

## 2021-03-01 DIAGNOSIS — I480 Paroxysmal atrial fibrillation: Secondary | ICD-10-CM | POA: Diagnosis not present

## 2021-03-01 DIAGNOSIS — G4733 Obstructive sleep apnea (adult) (pediatric): Secondary | ICD-10-CM | POA: Insufficient documentation

## 2021-03-01 DIAGNOSIS — I2510 Atherosclerotic heart disease of native coronary artery without angina pectoris: Secondary | ICD-10-CM | POA: Insufficient documentation

## 2021-03-01 DIAGNOSIS — I428 Other cardiomyopathies: Secondary | ICD-10-CM | POA: Insufficient documentation

## 2021-03-01 LAB — COMPREHENSIVE METABOLIC PANEL
ALT: 9 U/L (ref 0–44)
AST: 12 U/L — ABNORMAL LOW (ref 15–41)
Albumin: 3.5 g/dL (ref 3.5–5.0)
Alkaline Phosphatase: 54 U/L (ref 38–126)
Anion gap: 10 (ref 5–15)
BUN: 52 mg/dL — ABNORMAL HIGH (ref 8–23)
CO2: 28 mmol/L (ref 22–32)
Calcium: 9.2 mg/dL (ref 8.9–10.3)
Chloride: 105 mmol/L (ref 98–111)
Creatinine, Ser: 4.59 mg/dL — ABNORMAL HIGH (ref 0.61–1.24)
GFR, Estimated: 12 mL/min — ABNORMAL LOW (ref >60)
Glucose, Bld: 100 mg/dL — ABNORMAL HIGH (ref 70–99)
Potassium: 3.7 mmol/L (ref 3.5–5.1)
Sodium: 143 mmol/L (ref 135–145)
Total Bilirubin: 0.9 mg/dL (ref 0.3–1.2)
Total Protein: 6.4 g/dL — ABNORMAL LOW (ref 6.5–8.1)

## 2021-03-01 LAB — CUP PACEART REMOTE DEVICE CHECK
Battery Remaining Longevity: 83 mo
Battery Voltage: 3 V
Brady Statistic RV Percent Paced: 0.28 %
Date Time Interrogation Session: 20220516022723
HighPow Impedance: 68 Ohm
Implantable Lead Implant Date: 20170208
Implantable Lead Location: 753860
Implantable Lead Model: 6935
Implantable Pulse Generator Implant Date: 20170208
Lead Channel Impedance Value: 304 Ohm
Lead Channel Impedance Value: 361 Ohm
Lead Channel Pacing Threshold Amplitude: 0.875 V
Lead Channel Pacing Threshold Pulse Width: 0.4 ms
Lead Channel Sensing Intrinsic Amplitude: 11 mV
Lead Channel Sensing Intrinsic Amplitude: 11 mV
Lead Channel Setting Pacing Amplitude: 2 V
Lead Channel Setting Pacing Pulse Width: 0.4 ms
Lead Channel Setting Sensing Sensitivity: 0.3 mV

## 2021-03-01 LAB — TSH: TSH: 2.437 u[IU]/mL (ref 0.350–4.500)

## 2021-03-01 NOTE — Progress Notes (Signed)
ID:  Braylen, Staller 06-20-1941, MRN [id number]   Provider location: [facility] Type of Visit: Established patient   PCP:  Deland Pretty, MD  Cardiologist:  Dr. Aundra Dubin   History of Present Illness: Miguel Roach is a 80 y.o. male who has a history of chronic systolic CHF/nonischemic cardiomyopathy and CKD. He actually had an echo back in 2010 with EF 40-45% at the time.  He says that this really was not followed up upon by a cardiologist and that he did well for a number of years after that. In 6/16, he was admitted with acute systolic CHF.  Echo showed EF down to 20-25%.  Cardiac cath showed nonobstructive CAD.  His Lasix has been gradually increased.  This has brought his weight down and improved his breathing, but creatinine has risen. He had been on olmesartan but this was stopped because he thought it was causing cough.  He was then put on losartan, but this was stopped with rise in creatinine. Also intolerant to Bidil with orthostatic symptoms.  He was started on digoxin.  Echo in 12/16 showed persistently low EF, 15-20%.  Medtronic ICD was placed in 2/17.  Repeat echo 1/18 showed EF 25% with diffuse hypokinesis.   In 3/18, he had an episode of about 10 minutes atrial fibrillation noted on his ICD.  He also had 1 episode of VT terminated by an appropriate discharge.  He had push-mowed his grass and was very fatigued when he had the shock.  Since then, he has had no further arrhythmias.   Echo in 6/19 showed EF 25-30% with mild LV dilation and mild-moderate AI, normal RV size with mildly decreased systolic function, IVC normal.   Admitted 1/27 - 11/17/2018 with hypotension and found to have UTI/sepsis. Treated on ABX and IVF. Eventually transitioned back to po diuretics with HF team following. 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 Noted to have increased NSVT and PVCs, started on amiodarone.  Zio patch in 6/20 showed occasional PVCs, burden low.   Echo in 8/20 showed  EF 35%, severe LV dilation, normal RV size and systolic function, mild-moderate AI.   Echo in 2/22 showed EF 25-30%, severe LV dilation, septal-lateral dyssynchrony, mild LVH, low normal RV function, mild-moderate AI.   Patient has been doing well symptomatically.  Dyspnea only with heavy exertion.  He can climb stairs and walk on flat ground without problems.  No chest pain.  No orthopnea/PND.  No lightheadedness though SBP generally runs in the 90s.    ECG (personally reviewed): NSR, IVCD 130 msec.   Medtronic device interrogation: stable thoracic impedance, no AF/VT  Labs (6/16): BNP 1954, TSH normal Labs (7/16): K 4.1, creatinine 1.72 Labs (8/16): creatinine 2.3 Labs (9/16): digoxin 0.3, K 4, creatinine 2.14, SPEP negative, UPEP negative Labs (10/16): K 4.6, creatinine 1.62 Labs (11/16): K 4.4, creatinine 2.33, digoxin 0.5 Labs (12/16): creatinine 2.1 (nephrology) Labs (1/17): digoxin 0.6, uric acid 10.4 Labs (2/17): K 4, creatinine 2.13, hgb 11.6 Labs (5/17): K 3.8, creatinine 2.22, digoxin 0.7 Labs (9/17): K 4.4, creatinine 2.88, LFTs normal, digoxin 1.0, hgb 11.8, LDL 88, HDL 50 Labs (1/18): digoxin 0.5 Labs (3/18): K 4.4, creatinine 3.17 Labs (4/18): K 4.3, creatinine 3.39, digoxin 0.6 Labs (10/18): K 4.5, creatinine 3.65, digoxin 0.4 Labs (11/18): K 4, creatinine 3.24, digoxin 0.6 Labs (12/18): digoxin < 0.2 Labs (1/19): K 3.8, creatinine 3.15 Labs (2/19): K 4.0, creatinine 3.23 Labs (3/19): digoxin 0.4, K 3.8, creatinine 3.19 Labs (6/19):  digoxin 0.3, K 3.9, creatinine 3.12 Labs (9/19): K 3.9, creatinine 3.58, digoxin 0.3, LDL 91 Labs (3/20): K 4, creatinine 3.71 Labs (6/20): K 3.9, creatinine 3.94, LFTs normal, TSH normal Labs (8/20): K 3.7, creatinine 4.08, LFTs normal, TSH normal Labs (1/22): K 3.5, creatinine 4.34, LFTs normal Labs (3/22): K 3.4, creatinine 4.43  PMH: 1. CKD: Stage IV.  Follows with Dr Posey Pronto.  2. Gout 3. Chronic systolic CHF: Nonischemic  cardiomyopathy.  EF 40-45% in 2010.  Echo (6/16) with EF 20-25%, severe LV dilation, mild MR, mild AI, moderate LAE.  LHC/RHC (6/16) with nonobstructive CAD; mean RA 2, RV 48/1, PA 47/18, mean PCWP 12, CI 3.2.  CPX (10/16) with peak VO2 13.1 (50% predicted), VE/VCO2 slope 39.3, RER 1.22 => moderate to severe functional limitation.  Echo (12/16) with EF 15-20%, severe LV dilation, diffuse hypokinesis, moderate AI, mild MR, normal RV size and systolic function. Medtronic ICD.  - CPX (11/17): Submaximal with RER 0.95, VE/VCO2 slope 27, peak VO2 18.2.  Suspect no more than mild HF limitation.  - Echo (1/18): EF 25%, diffuse hypokinesis, normal RV size and systolic function, mild AI.  - Echo (6/19): EF 25-30% with mild LV dilation and mild-moderate AI, normal RV size with mildly decreased systolic function, IVC normal.  - Echo (8/20): EF 35%, severe LV dilation, normal RV size and systolic function, mild-moderate AI.  - Echo (2/22): EF 25-30%, severe LV dilation, septal-lateral dyssynchrony, mild LVH, low normal RV function, mild-moderate AI.  4. ACEI cough.  5. Pleural effusion: Thoracentesis on right in 8/16.  It was a transudate, likely due to CHF.  6. OSA: To start Bipap.  7. Esophageal duplication cyst.  8. High resolution CT chest 12/17: No evidence for interstitial lung disease.  9. Atrial fibrillation: Paroxysmal, 1 10 minute episode in 3/18.  10. VT: ICD discharge 3/18.  11. 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  - Zio patch in 6/20: occasional PVCs, low burden overall.  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   FH: Mother with "weak heart." Brother with sickle cell anemia.   ROS: All systems reviewed and negative except as per HPI.   Current Outpatient Medications  Medication Sig Dispense Refill  . allopurinol (ZYLOPRIM) 100 MG tablet Take 100 mg by mouth daily.    Marland Kitchen amiodarone (PACERONE) 100 MG tablet TAKE 1 TABLET BY MOUTH EVERY DAY 90 tablet 1  . carvedilol (COREG) 6.25  MG tablet Take 1 tablet (6.25 mg total) by mouth 2 (two) times daily with a meal. Needs appt 180 tablet 3  . colchicine 0.6 MG tablet Take 0.6 mg by mouth daily as needed (gout flare up).    . ferrous sulfate 324 MG TBEC Take 324 mg by mouth daily.    . furosemide (LASIX) 40 MG tablet TAKE 2 TABS BY MOUTH EVERY MORNING AND 1 TAB EVERY EVENING ALTERNATING WITH 2 TABS TWICE A DAY. 315 tablet 3  . hydrALAZINE (APRESOLINE) 25 MG tablet TAKE 1/2 TABLET BY MOUTH EVERY 8 (EIGHT) HOURS 135 tablet 1  . isosorbide mononitrate (IMDUR) 30 MG 24 hr tablet TAKE 1 TABLET BY MOUTH EVERY DAY 90 tablet 3  . COVID-19 mRNA vaccine, Pfizer, 30 MCG/0.3ML injection TO BE ADMINISTERED BY THE PHARMACIST .3 mL 0   No current facility-administered medications for this encounter.   Exam:   BP (!) 94/50   Pulse (!) 59   Wt 74.4 kg (164 lb)   SpO2 97%   BMI 22.87 kg/m  General:  NAD Neck: No JVD, no thyromegaly or thyroid nodule.  Lungs: Clear to auscultation bilaterally with normal respiratory effort. CV: Nondisplaced PMI.  Heart regular S1/S2, no S3/S4, no murmur.  No peripheral edema.  No carotid bruit.  Normal pedal pulses.  Abdomen: Soft, nontender, no hepatosplenomegaly, no distention.  Skin: Intact without lesions or rashes.  Neurologic: Alert and oriented x 3.  Psych: Normal affect. Extremities: No clubbing or cyanosis.  HEENT: Normal.   Assessment/Plan: 1. Chronic systolic CHF: Nonischemic cardiomyopathy.  CO preserved on RHC in 6/16, but 10/16 CPX showed moderate to severe functional impairment (seems worse than his symptoms would suggest).  However, repeat CPX in 11/17, though submaximal, suggested no more than mild HF limitation. No significant CAD with coronary angiography in 6/16.  No hx heavy ETOH or drug abuse. No marked HTN.  Negative SPEP/UPEP.  Possible prior myocarditis.  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  He now has Medtronic ICD.  Echo in 6/19 showed EF  25-30% (stable) with mild-moderate AI and mildly decreased RV systolic function.  Echo in 8/20 showed EF mildly higher at 35%, severe LV dilation, normal RV, mild-moderate AI.  Echo in 2/22 showed EF back down to 25-30%, severe LV dilation, septal-lateral dyssynchrony, mild LVH, low normal RV function, mild-moderate AI.  Weight stable, NYHA class II symptoms.  Not volume overloaded on exam or by Optivol.   - Continue alternating Lasix 80 mg bid with Lasix 80 qam/40 qpm. BMET today.  - Off spironolactone with elevated creatinine.    - Continue Coreg 6.25 mg bid, no BP room to increase. - Hold off on ACEI/ARB/Entresto at this time with elevated creatinine.  - Continue hydralazine 12.5 mg tid and Imdur 30 mg daily, He gets dizzy when we try to increase hydralazine.   - GFR too low for SGLT2 inhibitor - He is off digoxin with rising creatinine.  - IVCD 130 msec on ECG => this does not appear wide enough for him to derive significant benefit from CRT upgrade.  Will continue to follow.  2. CKD: Stage IV.  Creatinine has been >4 recently.  He follows with Dr Posey Pronto every 3 months. 3. OSA: Bipap. 4. Gout: Gout controlled on allopurinol. 5. VT/PVCs: Appropriate ICD discharge in 3/18, no recurrence. He is now on amiodarone due to very frequent PVCs. Zio patch in 6/20 showed low burden of PVCs.  - Continue Coreg - Continue amiodarone 100 mg daily. Check LFTs and TSH today.  He will need regular eye exams.  6. Atrial fibrillation: Paroxysmal.  Very short episodes only noted by device interrogation, none recently.  He has had no prolonged or symptomatic atrial fibrillation (data for anticoagulating short runs of atrial fibrillation is not strong), so will hold off on anticoagulation.  - If he develops prolonged or symptomatic atrial fibrillation, would anticoagulate.   Followup in 3 months.   Signed, [name], MD  03/01/2021  [place] [address] [facility] [place] [address] [phone] (office) [phone] (fax)

## 2021-03-01 NOTE — Patient Instructions (Addendum)
No medication changes today!  Labs today We will only contact you if something comes back abnormal or we need to make some changes. Otherwise no news is good news!  Your physician recommends that you schedule a follow-up appointment in: 4 months with Dr Mclean  Please call office at [phone] option 2 if you have any questions or concerns.    At the [facility], you and your health needs are our priority. As part of our continuing mission to provide you with exceptional heart care, we have created designated Provider Care Teams. These Care Teams include your primary Cardiologist (physician) and Advanced Practice Providers (APPs- Physician Assistants and Nurse Practitioners) who all work together to provide you with the care you need, when you need it.   You may see any of the following providers on your designated Care Team at your next follow up: Dr Daniel Bensimhon Dr Dalton [facility] Dr Brandon Winfrey Amy Clegg, NP Brittainy Simmons, PA Jessica Milford,NP Lauren Kemp, PharmD   Please be sure to bring in all your medications bottles to every appointment.     

## 2021-03-21 ENCOUNTER — Ambulatory Visit (INDEPENDENT_AMBULATORY_CARE_PROVIDER_SITE_OTHER): Payer: Medicare PPO

## 2021-03-21 DIAGNOSIS — Z95810 Presence of automatic (implantable) cardiac defibrillator: Secondary | ICD-10-CM

## 2021-03-21 DIAGNOSIS — I5022 Chronic systolic (congestive) heart failure: Secondary | ICD-10-CM | POA: Diagnosis not present

## 2021-03-23 ENCOUNTER — Telehealth: Payer: Self-pay

## 2021-03-23 NOTE — Progress Notes (Signed)
Remote ICD transmission.   

## 2021-03-23 NOTE — Progress Notes (Signed)
EPIC Encounter for ICM Monitoring  Patient Name: Miguel Roach is a 80 y.o. male Date: 03/23/2021 Primary Care Physican: Deland Pretty, MD Primary Cardiologist:[place] Electrophysiologist: Lovena Le Nephrologist: [facility](every 3 months) 6/8/2022Weight:162lbs   Spoke with patient and reports feeling well at this time. Heart failure questions reviewed. Pt asymptomatic.  Optivol thoracic impedancesuggestingnormalfluid level.   Prescribed:Furosemide 40 mgtake2 tablets (80 mgtotal)twice a day alternating with 80 mg AM and 40 mg PM.  Labs: 03/01/2021 Creatinine 4.59, BUN 52, Potassium 3.7, Sodium 143, GFR 12 12/27/2020 Creatinine 4.43, BUN 49, Potassium 3.4, Sodium 140, GFR 13 10/26/2020 Creatinine 4.39, BUN 57, Potassium 3.5, Sodium 146, GFR 13 A complete set of results can be found in Results Review.  Recommendations:No changes and encouraged to call if experiencing any fluid symptoms.  Follow-up plan: ICM clinic phone appointment on 04/25/2021. 91 day device clinic remote transmission8/15/2022.   EP/Cardiology Office Visits: 07/05/2021 with Dr Aundra Dubin  Copy of ICM check sent to Dr.Taylor.  3 month ICM trend: 03/21/2021.    1 Year ICM trend:       Rosalene Billings, RN 03/23/2021 1:52 PM

## 2021-03-23 NOTE — Telephone Encounter (Signed)
Opened in error

## 2021-04-02 IMAGING — DX DG CHEST 1V PORT
1 series · 1 of 1 positions shown · non-contrast
Comparison: Chest radiograph November 11, 2018

CLINICAL DATA: Dizziness and chills

EXAM:
PORTABLE CHEST 1 VIEW

[chest ap]
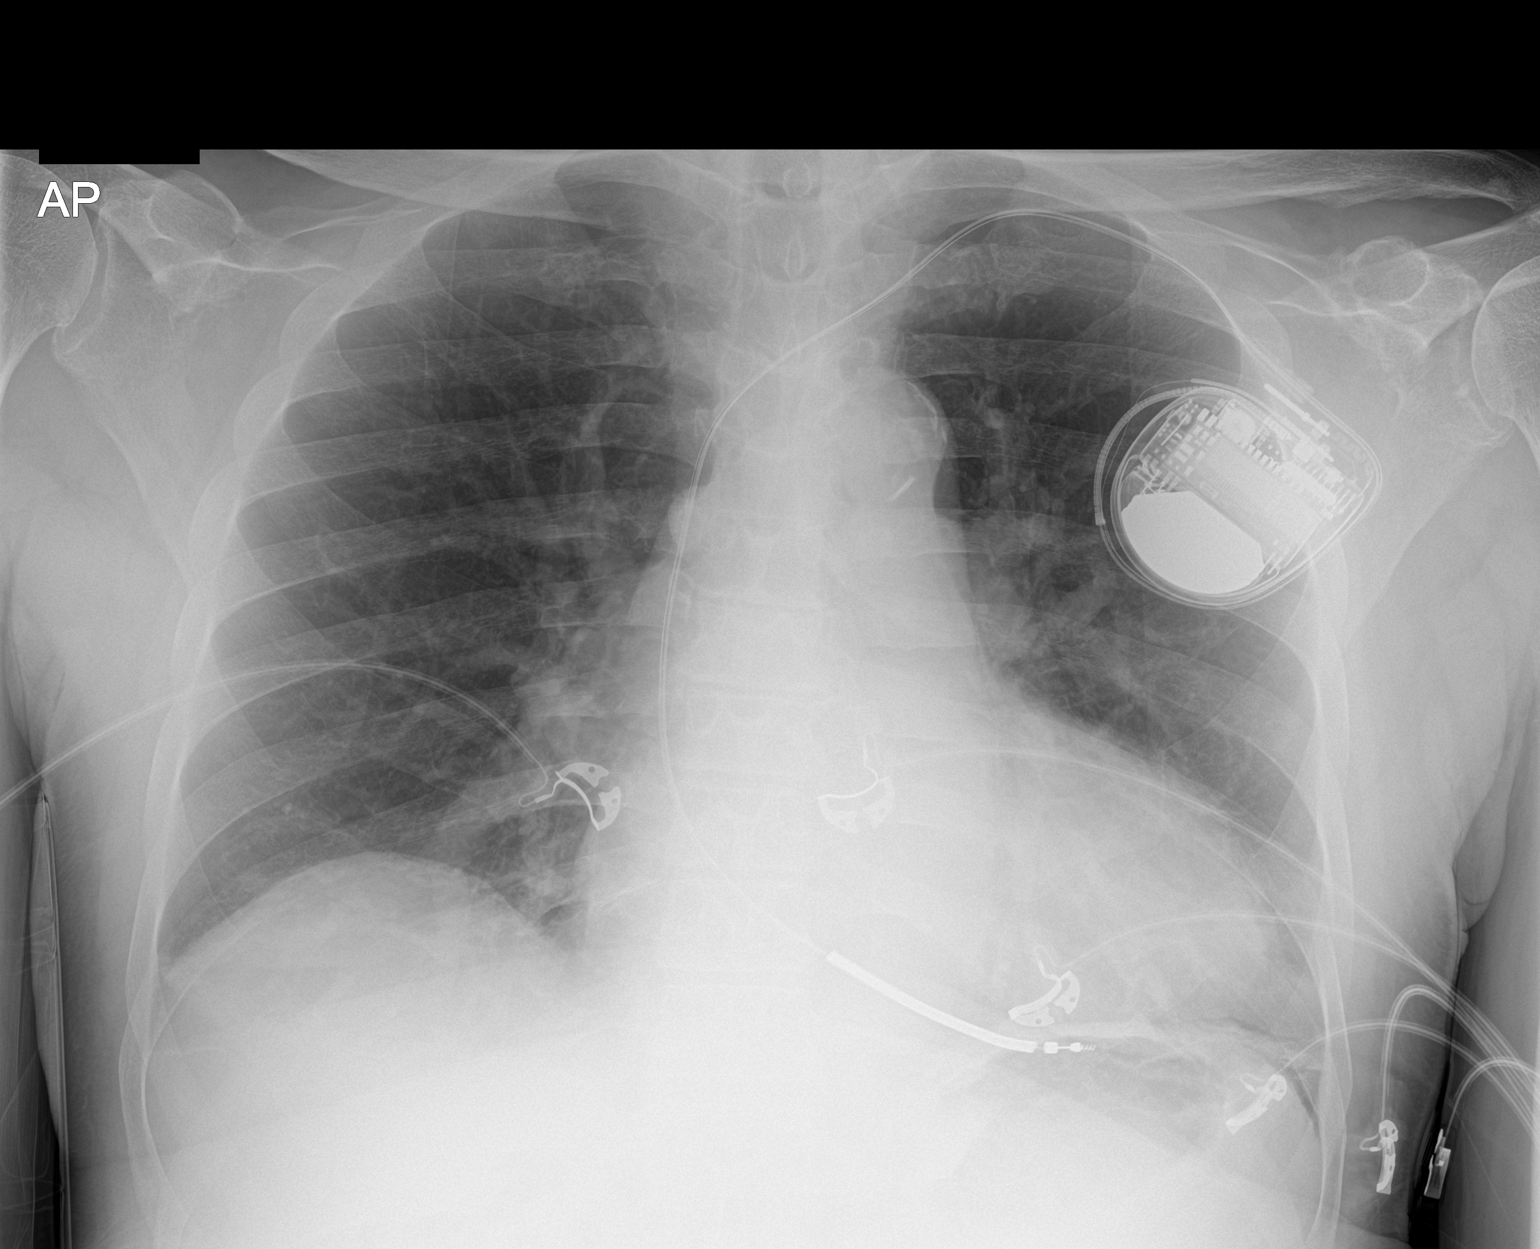

[1 of 1 positions shown; findings below may reference images not displayed]

FINDINGS: Enlarged cardiac silhouette, likely accentuated by technique. Aortic
arch calcifications. Left basilar hazy retrocardiac opacity. The
visualized skeletal structures are unremarkable.
IMPRESSION: Left basilar hazy retrocardiac opacity which may represent pneumonia
versus atelectasis.

## 2021-04-08 ENCOUNTER — Other Ambulatory Visit (HOSPITAL_COMMUNITY): Payer: Self-pay | Admitting: Cardiology

## 2021-04-16 ENCOUNTER — Other Ambulatory Visit (HOSPITAL_COMMUNITY): Payer: Self-pay | Admitting: Cardiology

## 2021-04-25 ENCOUNTER — Ambulatory Visit (INDEPENDENT_AMBULATORY_CARE_PROVIDER_SITE_OTHER): Payer: Medicare PPO

## 2021-04-25 DIAGNOSIS — Z95810 Presence of automatic (implantable) cardiac defibrillator: Secondary | ICD-10-CM | POA: Diagnosis not present

## 2021-04-25 DIAGNOSIS — I5022 Chronic systolic (congestive) heart failure: Secondary | ICD-10-CM | POA: Diagnosis not present

## 2021-04-27 NOTE — Progress Notes (Signed)
EPIC Encounter for ICM Monitoring  Patient Name: Miguel Roach is a 80 y.o. male Date: 04/27/2021 Primary Care Physican: Deland Pretty, MD Primary Cardiologist: Aundra Dubin Electrophysiologist: Lovena Le Nephrologist: [place] (every 3 months) 03/23/2021 Weight: 162 lbs     Spoke with patient and heart failure questions reviewed.  Pt asymptomatic for fluid accumulation and feeing well.   Optivol thoracic impedance suggesting normal fluid level.     Prescribed: Furosemide 40 mg take 2 tablets (80 mg total) twice a day alternating with 80 mg AM and 40 mg PM.    Labs: 03/01/2021 Creatinine 4.59, BUN 52, Potassium 3.7, Sodium 143, GFR 12 12/27/2020 Creatinine 4.43, BUN 49, Potassium 3.4, Sodium 140, GFR 13 10/26/2020 Creatinine 4.39, BUN 57, Potassium 3.5, Sodium 146, GFR 13 A complete set of results can be found in Results Review.     Recommendations:  No changes and encouraged to call if experiencing any fluid symptoms.   Follow-up plan: ICM clinic phone appointment on 05/31/2021.   91 day device clinic remote transmission 05/30/2021.      EP/Cardiology Office Visits:  07/05/2021 with Dr Aundra Dubin   Copy of ICM check sent to Dr. Lovena Le.  3 month ICM trend: 04/25/2021.    1 Year ICM trend:       Rosalene Billings, RN 04/27/2021 2:38 PM

## 2021-05-17 DIAGNOSIS — N185 Chronic kidney disease, stage 5: Secondary | ICD-10-CM | POA: Diagnosis not present

## 2021-05-25 DIAGNOSIS — N2581 Secondary hyperparathyroidism of renal origin: Secondary | ICD-10-CM | POA: Diagnosis not present

## 2021-05-25 DIAGNOSIS — N185 Chronic kidney disease, stage 5: Secondary | ICD-10-CM | POA: Diagnosis not present

## 2021-05-25 DIAGNOSIS — I120 Hypertensive chronic kidney disease with stage 5 chronic kidney disease or end stage renal disease: Secondary | ICD-10-CM | POA: Diagnosis not present

## 2021-05-25 DIAGNOSIS — D631 Anemia in chronic kidney disease: Secondary | ICD-10-CM | POA: Diagnosis not present

## 2021-05-30 ENCOUNTER — Ambulatory Visit (INDEPENDENT_AMBULATORY_CARE_PROVIDER_SITE_OTHER): Payer: Medicare PPO

## 2021-05-30 DIAGNOSIS — I428 Other cardiomyopathies: Secondary | ICD-10-CM

## 2021-05-31 ENCOUNTER — Ambulatory Visit (INDEPENDENT_AMBULATORY_CARE_PROVIDER_SITE_OTHER): Payer: Medicare PPO

## 2021-05-31 DIAGNOSIS — Z95810 Presence of automatic (implantable) cardiac defibrillator: Secondary | ICD-10-CM

## 2021-05-31 DIAGNOSIS — I5022 Chronic systolic (congestive) heart failure: Secondary | ICD-10-CM | POA: Diagnosis not present

## 2021-06-01 LAB — CUP PACEART REMOTE DEVICE CHECK
Battery Remaining Longevity: 77 mo
Battery Voltage: 3 V
Brady Statistic RV Percent Paced: 0.21 %
Date Time Interrogation Session: 20220815022701
HighPow Impedance: 69 Ohm
Implantable Lead Implant Date: 20170208
Implantable Lead Location: 753860
Implantable Lead Model: 6935
Implantable Pulse Generator Implant Date: 20170208
Lead Channel Impedance Value: 285 Ohm
Lead Channel Impedance Value: 361 Ohm
Lead Channel Pacing Threshold Amplitude: 0.875 V
Lead Channel Pacing Threshold Pulse Width: 0.4 ms
Lead Channel Sensing Intrinsic Amplitude: 11 mV
Lead Channel Sensing Intrinsic Amplitude: 11 mV
Lead Channel Setting Pacing Amplitude: 2 V
Lead Channel Setting Pacing Pulse Width: 0.4 ms
Lead Channel Setting Sensing Sensitivity: 0.3 mV

## 2021-06-03 ENCOUNTER — Telehealth: Payer: Self-pay

## 2021-06-03 NOTE — Telephone Encounter (Signed)
Remote ICM transmission received.  Attempted call to patient regarding ICM remote transmission and left detailed message per DPR.  Advised to return call for any fluid symptoms or questions. Next ICM remote transmission scheduled 07/04/2021.

## 2021-06-03 NOTE — Progress Notes (Signed)
EPIC Encounter for ICM Monitoring  Patient Name: Miguel Roach is a 80 y.o. male Date: 06/03/2021 Primary Care Physican: Deland Pretty, MD Primary Cardiologist: Aundra Dubin Electrophysiologist: Lovena Le Nephrologist: [place] (every 3 months) 03/23/2021 Weight: 162 lbs     Attempted call to patient and unable to reach.  Left detailed message per DPR regarding transmission. Transmission reviewed.    Optivol thoracic impedance suggesting normal fluid level.     Prescribed: Furosemide 40 mg take 2 tablets (80 mg total) twice a day alternating with 80 mg AM and 40 mg PM.    Labs: 03/01/2021 Creatinine 4.59, BUN 52, Potassium 3.7, Sodium 143, GFR 12 12/27/2020 Creatinine 4.43, BUN 49, Potassium 3.4, Sodium 140, GFR 13 10/26/2020 Creatinine 4.39, BUN 57, Potassium 3.5, Sodium 146, GFR 13 A complete set of results can be found in Results Review.     Recommendations:  Left voice mail with ICM number and encouraged to call if experiencing any fluid symptoms.   Follow-up plan: ICM clinic phone appointment on 07/04/2021.   91 day device clinic remote transmission 08/29/2021.      EP/Cardiology Office Visits:  07/05/2021 with Dr Aundra Dubin   Copy of ICM check sent to Dr. Lovena Le.   3 month ICM trend: 05/30/2021.    1 Year ICM trend:       Rosalene Billings, RN 06/03/2021 10:38 AM

## 2021-06-17 NOTE — Progress Notes (Signed)
Remote ICD transmission.   

## 2021-07-03 ENCOUNTER — Other Ambulatory Visit (HOSPITAL_COMMUNITY): Payer: Self-pay | Admitting: Cardiology

## 2021-07-04 ENCOUNTER — Ambulatory Visit (INDEPENDENT_AMBULATORY_CARE_PROVIDER_SITE_OTHER): Payer: Medicare PPO

## 2021-07-04 DIAGNOSIS — I5022 Chronic systolic (congestive) heart failure: Secondary | ICD-10-CM | POA: Diagnosis not present

## 2021-07-04 DIAGNOSIS — Z95810 Presence of automatic (implantable) cardiac defibrillator: Secondary | ICD-10-CM | POA: Diagnosis not present

## 2021-07-05 ENCOUNTER — Ambulatory Visit (HOSPITAL_COMMUNITY)
Admission: RE | Admit: 2021-07-05 | Discharge: 2021-07-05 | Disposition: A | Discharge status: Home / Self Care | Payer: Medicare PPO | Source: Ambulatory Visit | Attending: Cardiology | Admitting: Cardiology

## 2021-07-05 ENCOUNTER — Other Ambulatory Visit: Payer: Self-pay

## 2021-07-05 ENCOUNTER — Encounter (HOSPITAL_COMMUNITY): Payer: Self-pay | Admitting: Cardiology

## 2021-07-05 VITALS — BP 110/60 | HR 58 | Wt 162.8 lb

## 2021-07-05 DIAGNOSIS — Z9989 Dependence on other enabling machines and devices: Secondary | ICD-10-CM | POA: Insufficient documentation

## 2021-07-05 DIAGNOSIS — M109 Gout, unspecified: Secondary | ICD-10-CM | POA: Diagnosis not present

## 2021-07-05 DIAGNOSIS — Z79899 Other long term (current) drug therapy: Secondary | ICD-10-CM | POA: Diagnosis not present

## 2021-07-05 DIAGNOSIS — I5022 Chronic systolic (congestive) heart failure: Secondary | ICD-10-CM

## 2021-07-05 DIAGNOSIS — I2510 Atherosclerotic heart disease of native coronary artery without angina pectoris: Secondary | ICD-10-CM | POA: Insufficient documentation

## 2021-07-05 DIAGNOSIS — N184 Chronic kidney disease, stage 4 (severe): Secondary | ICD-10-CM | POA: Insufficient documentation

## 2021-07-05 DIAGNOSIS — I480 Paroxysmal atrial fibrillation: Secondary | ICD-10-CM | POA: Diagnosis not present

## 2021-07-05 DIAGNOSIS — G4733 Obstructive sleep apnea (adult) (pediatric): Secondary | ICD-10-CM | POA: Diagnosis not present

## 2021-07-05 DIAGNOSIS — I428 Other cardiomyopathies: Secondary | ICD-10-CM | POA: Diagnosis not present

## 2021-07-05 DIAGNOSIS — Z8249 Family history of ischemic heart disease and other diseases of the circulatory system: Secondary | ICD-10-CM | POA: Insufficient documentation

## 2021-07-05 DIAGNOSIS — Z95810 Presence of automatic (implantable) cardiac defibrillator: Secondary | ICD-10-CM | POA: Insufficient documentation

## 2021-07-05 LAB — COMPREHENSIVE METABOLIC PANEL
ALT: 8 U/L (ref 0–44)
AST: 9 U/L — ABNORMAL LOW (ref 15–41)
Albumin: 3.5 g/dL (ref 3.5–5.0)
Alkaline Phosphatase: 51 U/L (ref 38–126)
Anion gap: 11 (ref 5–15)
BUN: 53 mg/dL — ABNORMAL HIGH (ref 8–23)
CO2: 26 mmol/L (ref 22–32)
Calcium: 9.2 mg/dL (ref 8.9–10.3)
Chloride: 106 mmol/L (ref 98–111)
Creatinine, Ser: 4.3 mg/dL — ABNORMAL HIGH (ref 0.61–1.24)
GFR, Estimated: 13 mL/min — ABNORMAL LOW (ref >60)
Glucose, Bld: 84 mg/dL (ref 70–99)
Potassium: 3.5 mmol/L (ref 3.5–5.1)
Sodium: 143 mmol/L (ref 135–145)
Total Bilirubin: 0.7 mg/dL (ref 0.3–1.2)
Total Protein: 6.1 g/dL — ABNORMAL LOW (ref 6.5–8.1)

## 2021-07-05 LAB — TSH: TSH: 1.577 u[IU]/mL (ref 0.350–4.500)

## 2021-07-05 NOTE — Progress Notes (Signed)
ID:  Cashton, Hosley 12-20-1940, MRN [id number]   Provider location: [place] [facility] Type of Visit: Established patient   PCP:  Deland Pretty, MD  Cardiologist:  Dr. Aundra Dubin   History of Present Illness: Miguel Roach is a 80 y.o. male who has a history of chronic systolic CHF/nonischemic cardiomyopathy and CKD. He actually had an echo back in 2010 with EF 40-45% at the time.  He says that this really was not followed up upon by a cardiologist and that he did well for a number of years after that. In 6/16, he was admitted with acute systolic CHF.  Echo showed EF down to 20-25%.  Cardiac cath showed nonobstructive CAD.  His Lasix has been gradually increased.  This has brought his weight down and improved his breathing, but creatinine has risen. He had been on olmesartan but this was stopped because he thought it was causing cough.  He was then put on losartan, but this was stopped with rise in creatinine. Also intolerant to Bidil with orthostatic symptoms.  He was started on digoxin.  Echo in 12/16 showed persistently low EF, 15-20%.  Medtronic ICD was placed in 2/17.  Repeat echo 1/18 showed EF 25% with diffuse hypokinesis.   In 3/18, he had an episode of about 10 minutes atrial fibrillation noted on his ICD.  He also had 1 episode of VT terminated by an appropriate discharge.  He had push-mowed his grass and was very fatigued when he had the shock.  Since then, he has had no further arrhythmias.   Echo in 6/19 showed EF 25-30% with mild LV dilation and mild-moderate AI, normal RV size with mildly decreased systolic function, IVC normal.   Admitted 1/27 - 11/17/2018 with hypotension and found to have UTI/sepsis. Treated on ABX and IVF. Eventually transitioned back to po diuretics with HF team following. 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 Noted to have increased NSVT and PVCs, started on amiodarone.  Zio patch in 6/20 showed occasional PVCs, burden low.   Echo in 8/20 showed  EF 35%, severe LV dilation, normal RV size and systolic function, mild-moderate AI.   Echo in 2/22 showed EF 25-30%, severe LV dilation, septal-lateral dyssynchrony, mild LVH, low normal RV function, mild-moderate AI.   Patient has been doing well symptomatically.  Weight is down 2 lbs.  There is no exertional dyspnea, no chest pain, no lightheadedness.  He is walking for exercise.     ECG (personally reviewed): NSR, LBBB 128 msec.   Medtronic device interrogation: stable thoracic impedance, no AF/VT  Labs (6/16): BNP 1954, TSH normal Labs (7/16): K 4.1, creatinine 1.72 Labs (8/16): creatinine 2.3 Labs (9/16): digoxin 0.3, K 4, creatinine 2.14, SPEP negative, UPEP negative Labs (10/16): K 4.6, creatinine 1.62 Labs (11/16): K 4.4, creatinine 2.33, digoxin 0.5 Labs (12/16): creatinine 2.1 (nephrology) Labs (1/17): digoxin 0.6, uric acid 10.4 Labs (2/17): K 4, creatinine 2.13, hgb 11.6 Labs (5/17): K 3.8, creatinine 2.22, digoxin 0.7 Labs (9/17): K 4.4, creatinine 2.88, LFTs normal, digoxin 1.0, hgb 11.8, LDL 88, HDL 50 Labs (1/18): digoxin 0.5 Labs (3/18): K 4.4, creatinine 3.17 Labs (4/18): K 4.3, creatinine 3.39, digoxin 0.6 Labs (10/18): K 4.5, creatinine 3.65, digoxin 0.4 Labs (11/18): K 4, creatinine 3.24, digoxin 0.6 Labs (12/18): digoxin < 0.2 Labs (1/19): K 3.8, creatinine 3.15 Labs (2/19): K 4.0, creatinine 3.23 Labs (3/19): digoxin 0.4, K 3.8, creatinine 3.19 Labs (6/19): digoxin 0.3, K 3.9, creatinine 3.12 Labs (9/19): K 3.9, creatinine  3.58, digoxin 0.3, LDL 91 Labs (3/20): K 4, creatinine 3.71 Labs (6/20): K 3.9, creatinine 3.94, LFTs normal, TSH normal Labs (8/20): K 3.7, creatinine 4.08, LFTs normal, TSH normal Labs (1/22): K 3.5, creatinine 4.34, LFTs normal Labs (3/22): K 3.4, creatinine 4.43 Labs (5/22): K 3.7, creatinine 4.59, LFTs normal, TSH normal  PMH: 1. CKD: Stage IV.  Follows with Dr Posey Pronto.  2. Gout 3. Chronic systolic CHF: Nonischemic cardiomyopathy.   EF 40-45% in 2010.  Echo (6/16) with EF 20-25%, severe LV dilation, mild MR, mild AI, moderate LAE.  LHC/RHC (6/16) with nonobstructive CAD; mean RA 2, RV 48/1, PA 47/18, mean PCWP 12, CI 3.2.  CPX (10/16) with peak VO2 13.1 (50% predicted), VE/VCO2 slope 39.3, RER 1.22 => moderate to severe functional limitation.  Echo (12/16) with EF 15-20%, severe LV dilation, diffuse hypokinesis, moderate AI, mild MR, normal RV size and systolic function. Medtronic ICD.  - CPX (11/17): Submaximal with RER 0.95, VE/VCO2 slope 27, peak VO2 18.2.  Suspect no more than mild HF limitation.  - Echo (1/18): EF 25%, diffuse hypokinesis, normal RV size and systolic function, mild AI.  - Echo (6/19): EF 25-30% with mild LV dilation and mild-moderate AI, normal RV size with mildly decreased systolic function, IVC normal.  - Echo (8/20): EF 35%, severe LV dilation, normal RV size and systolic function, mild-moderate AI.  - Echo (2/22): EF 25-30%, severe LV dilation, septal-lateral dyssynchrony, mild LVH, low normal RV function, mild-moderate AI.  4. ACEI cough.  5. Pleural effusion: Thoracentesis on right in 8/16.  It was a transudate, likely due to CHF.  6. OSA: To start Bipap.  7. Esophageal duplication cyst.  8. High resolution CT chest 12/17: No evidence for interstitial lung disease.  9. Atrial fibrillation: Paroxysmal, 1 10 minute episode in 3/18.  10. VT: ICD discharge 3/18.  11. 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  - Zio patch in 6/20: occasional PVCs, low burden overall.  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   FH: Mother with "weak heart." Brother with sickle cell anemia.   ROS: All systems reviewed and negative except as per HPI.   Current Outpatient Medications  Medication Sig Dispense Refill   allopurinol (ZYLOPRIM) 100 MG tablet Take 100 mg by mouth daily.     amiodarone (PACERONE) 100 MG tablet TAKE 1 TABLET BY MOUTH EVERY DAY 90 tablet 3   carvedilol (COREG) 6.25 MG tablet TAKE 1  TABLET (6.25 MG TOTAL) BY MOUTH 2 (TWO) TIMES DAILY WITH A MEAL. NEEDS APPT 180 tablet 3   colchicine 0.6 MG tablet Take 0.6 mg by mouth daily as needed (gout flare up).     FERROUS SULFATE PO Take 65 mg by mouth daily.     furosemide (LASIX) 40 MG tablet TAKE 2 TABS BY MOUTH EVERY MORNING AND 1 TAB EVERY EVENING ALTERNATING WITH 2 TABS TWICE A DAY. 315 tablet 3   isosorbide mononitrate (IMDUR) 30 MG 24 hr tablet TAKE 1 TABLET BY MOUTH EVERY DAY 90 tablet 3   hydrALAZINE (APRESOLINE) 25 MG tablet TAKE 1/2 TABLET BY MOUTH EVERY 8 (EIGHT) HOURS 135 tablet 1   No current facility-administered medications for this encounter.   Exam:   BP 110/60   Pulse (!) 58   Wt 73.8 kg (162 lb 12.8 oz)   SpO2 98%   BMI 22.71 kg/m  General: NAD Neck: No JVD, no thyromegaly or thyroid nodule.  Lungs: Clear to auscultation bilaterally with normal respiratory effort. CV:  Nondisplaced PMI.  Heart regular S1/S2, no S3/S4, no murmur.  No peripheral edema.  No carotid bruit.  Normal pedal pulses.  Abdomen: Soft, nontender, no hepatosplenomegaly, no distention.  Skin: Intact without lesions or rashes.  Neurologic: Alert and oriented x 3.  Psych: Normal affect. Extremities: No clubbing or cyanosis.  HEENT: Normal.   Assessment/Plan: 1. Chronic systolic CHF: Nonischemic cardiomyopathy.  CO preserved on RHC in 6/16, but 10/16 CPX showed moderate to severe functional impairment (seems worse than his symptoms would suggest).  However, repeat CPX in 11/17, though submaximal, suggested no more than mild HF limitation. No significant CAD with coronary angiography in 6/16.  No hx heavy ETOH or drug abuse. No marked HTN.  Negative SPEP/UPEP.  Possible prior myocarditis.  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  He now has Medtronic ICD.  Echo in 6/19 showed EF 25-30% (stable) with mild-moderate AI and mildly decreased RV systolic function.  Echo in 8/20 showed EF mildly higher at 35%,  severe LV dilation, normal RV, mild-moderate AI.  Echo in 2/22 showed EF back down to 25-30%, severe LV dilation, septal-lateral dyssynchrony, mild LVH, low normal RV function, mild-moderate AI.  Weight down 2 lbs, NYHA class II symptoms.  Not volume overloaded on exam or by Optivol.   - Continue alternating Lasix 80 mg bid with Lasix 80 qam/40 qpm. BMET today.  - Off spironolactone with elevated creatinine.    - Continue Coreg 6.25 mg bid, would not increase with HR in 50s. - Hold off on ACEI/ARB/Entresto at this time with elevated creatinine.  - Continue hydralazine 12.5 mg tid and Imdur 30 mg daily, He gets dizzy when we try to increase hydralazine.   - GFR too low for SGLT2 inhibitor - He is off digoxin with rising creatinine.  - IVCD 128 msec on ECG => this does not appear wide enough for him to derive significant benefit from CRT upgrade.  Will continue to follow.  2. CKD: Stage IV.  Creatinine has been >4 recently.  He follows with Dr Posey Pronto every 3 months. 3. OSA: Bipap. 4. Gout: Gout controlled on allopurinol. 5. VT/PVCs: Appropriate ICD discharge in 3/18, no recurrence. He is now on amiodarone due to very frequent PVCs. Zio patch in 6/20 showed low burden of PVCs.  - Continue Coreg - Continue amiodarone 100 mg daily. Check LFTs and TSH today.  He will need regular eye exams.  6. Atrial fibrillation: Paroxysmal.  Very short episodes only noted by device interrogation, none recently.  He has had no prolonged or symptomatic atrial fibrillation (data for anticoagulating short runs of atrial fibrillation is not strong), so will hold off on anticoagulation.  - If he develops prolonged or symptomatic atrial fibrillation, would anticoagulate.   Followup in 4 months.   Signed, [name], MD  07/05/2021  [facility] [address] [facility] [place] [address] [phone] (office) [phone] (fax)

## 2021-07-05 NOTE — Patient Instructions (Signed)
EKG done today.  Labs done today. We will contact you only if your labs are abnormal.  No medication changes were made. Please continue all current medications as prescribed.  Your physician recommends that you schedule a follow-up appointment in: 4 months. Please contact our office in December to schedule a January appointment.  If you have any questions or concerns before your next appointment please send [place] a message through [place] or call our office at [phone].    TO LEAVE A MESSAGE FOR THE NURSE SELECT OPTION 2, PLEASE LEAVE A MESSAGE INCLUDING: YOUR NAME DATE OF BIRTH CALL BACK NUMBER REASON FOR CALL**this is important as we prioritize the call backs  YOU WILL RECEIVE A CALL BACK THE SAME DAY AS LONG AS YOU CALL BEFORE 4:00 PM   Do the following things EVERYDAY: Weigh yourself in the morning before breakfast. Write it down and keep it in a log. Take your medicines as prescribed Eat low salt foods--Limit salt (sodium) to 2000 mg per day.  Stay as active as you can everyday Limit all fluids for the day to less than 2 liters   At the [facility], you and your health needs are our priority. As part of our continuing mission to provide you with exceptional heart care, we have created designated Provider Care Teams. These Care Teams include your primary Cardiologist (physician) and Advanced Practice Providers (APPs- Physician Assistants and Nurse Practitioners) who all work together to provide you with the care you need, when you need it.   You may see any of the following providers on your designated Care Team at your next follow up: Dr Glori Bickers Dr Haynes Kerns, NP Lyda Jester, [place] Audry Riles, PharmD   Please be sure to bring in all your medications bottles to every appointment.

## 2021-07-06 NOTE — Progress Notes (Signed)
EPIC Encounter for ICM Monitoring  Patient Name: Miguel Roach is a 80 y.o. male Date: 07/06/2021 Primary Care Physican: Deland Pretty, MD Primary Cardiologist: Aundra Dubin Electrophysiologist: Lovena Le Nephrologist: [place] (every 3 months) 07/06/2021 Weight: 162 lbs     Spoke with patient and heart failure questions reviewed.  Pt asymptomatic for fluid accumulation and feeling well.  Walking every other day for exercise.   Optivol thoracic impedance suggesting normal fluid level.     Prescribed: Furosemide 40 mg take 2 tablets (80 mg total) twice a day alternating with 80 mg AM and 40 mg PM.    Labs: 07/05/2021 Creatinine 4.30, BUN 53, Potassium 3.5, Sodium 143, GFR 13 03/01/2021 Creatinine 4.59, BUN 52, Potassium 3.7, Sodium 143, GFR 12 12/27/2020 Creatinine 4.43, BUN 49, Potassium 3.4, Sodium 140, GFR 13 10/26/2020 Creatinine 4.39, BUN 57, Potassium 3.5, Sodium 146, GFR 13 A complete set of results can be found in Results Review.     Recommendations:  No changes and encouraged to call if experiencing any fluid symptoms.   Follow-up plan: ICM clinic phone appointment on 08/08/2021.   91 day device clinic remote transmission 08/29/2021.      EP/Cardiology Office Visits:  Recall 11/02/2021 with Dr Aundra Dubin.  Recall 10/29/2021 with Dr Lovena Le.   Copy of ICM check sent to Dr. Lovena Le.    3 month ICM trend: 07/05/2021.    1 Year ICM trend:       Rosalene Billings, RN 07/06/2021 9:36 AM

## 2021-08-04 ENCOUNTER — Other Ambulatory Visit (HOSPITAL_COMMUNITY): Payer: Self-pay | Admitting: Cardiology

## 2021-08-08 ENCOUNTER — Ambulatory Visit (INDEPENDENT_AMBULATORY_CARE_PROVIDER_SITE_OTHER): Payer: Medicare PPO

## 2021-08-08 DIAGNOSIS — Z95810 Presence of automatic (implantable) cardiac defibrillator: Secondary | ICD-10-CM | POA: Diagnosis not present

## 2021-08-08 DIAGNOSIS — I5022 Chronic systolic (congestive) heart failure: Secondary | ICD-10-CM | POA: Diagnosis not present

## 2021-08-09 ENCOUNTER — Telehealth: Payer: Self-pay

## 2021-08-09 NOTE — Telephone Encounter (Signed)
Remote ICM transmission received.  Attempted call to patient regarding ICM remote transmission and left detailed message per DPR.  Advised to return call for any fluid symptoms or questions. Next ICM remote transmission scheduled 09/19/2021.

## 2021-08-09 NOTE — Progress Notes (Signed)
EPIC Encounter for ICM Monitoring  Patient Name: Miguel Roach is a 80 y.o. male Date: 08/09/2021 Primary Care Physican: Deland Pretty, MD Primary Cardiologist: Aundra Dubin Electrophysiologist: Lovena Le Nephrologist: [place] (every 3 months) 07/06/2021 Weight: 162 lbs     Attempted call to patient and unable to reach.  Left detailed message per DPR regarding transmission. Transmission reviewed.    Optivol thoracic impedance suggesting normal fluid level.     Prescribed: Furosemide 40 mg take 2 tablets (80 mg total) twice a day alternating with 80 mg AM and 40 mg PM.    Labs: 07/05/2021 Creatinine 4.30, BUN 53, Potassium 3.5, Sodium 143, GFR 13 03/01/2021 Creatinine 4.59, BUN 52, Potassium 3.7, Sodium 143, GFR 12 12/27/2020 Creatinine 4.43, BUN 49, Potassium 3.4, Sodium 140, GFR 13 10/26/2020 Creatinine 4.39, BUN 57, Potassium 3.5, Sodium 146, GFR 13 A complete set of results can be found in Results Review.     Recommendations:  Left voice mail with ICM number and encouraged to call if experiencing any fluid symptoms.   Follow-up plan: ICM clinic phone appointment on 09/19/2021.   91 day device clinic remote transmission 08/29/2021.      EP/Cardiology Office Visits:  Recall 11/02/2021 with Dr Aundra Dubin.  Recall 10/29/2021 with Dr Lovena Le.   Copy of ICM check sent to Dr. Lovena Le.    3 month ICM trend: 08/08/2021.    1 Year ICM trend:       Rosalene Billings, RN 08/09/2021 4:11 PM

## 2021-08-26 DIAGNOSIS — H52223 Regular astigmatism, bilateral: Secondary | ICD-10-CM | POA: Diagnosis not present

## 2021-08-26 DIAGNOSIS — H5213 Myopia, bilateral: Secondary | ICD-10-CM | POA: Diagnosis not present

## 2021-08-26 DIAGNOSIS — H524 Presbyopia: Secondary | ICD-10-CM | POA: Diagnosis not present

## 2021-08-29 ENCOUNTER — Ambulatory Visit (INDEPENDENT_AMBULATORY_CARE_PROVIDER_SITE_OTHER): Payer: Medicare PPO

## 2021-08-29 DIAGNOSIS — N189 Chronic kidney disease, unspecified: Secondary | ICD-10-CM | POA: Diagnosis not present

## 2021-08-29 DIAGNOSIS — I428 Other cardiomyopathies: Secondary | ICD-10-CM

## 2021-08-29 DIAGNOSIS — D631 Anemia in chronic kidney disease: Secondary | ICD-10-CM | POA: Diagnosis not present

## 2021-08-29 DIAGNOSIS — N185 Chronic kidney disease, stage 5: Secondary | ICD-10-CM | POA: Diagnosis not present

## 2021-08-29 DIAGNOSIS — I120 Hypertensive chronic kidney disease with stage 5 chronic kidney disease or end stage renal disease: Secondary | ICD-10-CM | POA: Diagnosis not present

## 2021-08-29 DIAGNOSIS — N2581 Secondary hyperparathyroidism of renal origin: Secondary | ICD-10-CM | POA: Diagnosis not present

## 2021-08-30 LAB — CUP PACEART REMOTE DEVICE CHECK
Battery Remaining Longevity: 79 mo
Battery Voltage: 3 V
Brady Statistic RV Percent Paced: 0.2 %
Date Time Interrogation Session: 20221114043724
HighPow Impedance: 63 Ohm
Implantable Lead Implant Date: 20170208
Implantable Lead Location: 753860
Implantable Lead Model: 6935
Implantable Pulse Generator Implant Date: 20170208
Lead Channel Impedance Value: 342 Ohm
Lead Channel Impedance Value: 361 Ohm
Lead Channel Pacing Threshold Amplitude: 1 V
Lead Channel Pacing Threshold Pulse Width: 0.4 ms
Lead Channel Sensing Intrinsic Amplitude: 11.5 mV
Lead Channel Sensing Intrinsic Amplitude: 11.5 mV
Lead Channel Setting Pacing Amplitude: 2 V
Lead Channel Setting Pacing Pulse Width: 0.4 ms
Lead Channel Setting Sensing Sensitivity: 0.3 mV

## 2021-09-06 NOTE — Progress Notes (Signed)
Remote ICD transmission.   

## 2021-09-14 DIAGNOSIS — I10 Essential (primary) hypertension: Secondary | ICD-10-CM | POA: Diagnosis not present

## 2021-09-14 DIAGNOSIS — Z125 Encounter for screening for malignant neoplasm of prostate: Secondary | ICD-10-CM | POA: Diagnosis not present

## 2021-09-14 DIAGNOSIS — D649 Anemia, unspecified: Secondary | ICD-10-CM | POA: Diagnosis not present

## 2021-09-19 ENCOUNTER — Ambulatory Visit (INDEPENDENT_AMBULATORY_CARE_PROVIDER_SITE_OTHER): Payer: Medicare PPO

## 2021-09-19 DIAGNOSIS — N185 Chronic kidney disease, stage 5: Secondary | ICD-10-CM | POA: Diagnosis not present

## 2021-09-19 DIAGNOSIS — D696 Thrombocytopenia, unspecified: Secondary | ICD-10-CM | POA: Diagnosis not present

## 2021-09-19 DIAGNOSIS — Z23 Encounter for immunization: Secondary | ICD-10-CM | POA: Diagnosis not present

## 2021-09-19 DIAGNOSIS — I5022 Chronic systolic (congestive) heart failure: Secondary | ICD-10-CM | POA: Diagnosis not present

## 2021-09-19 DIAGNOSIS — N2581 Secondary hyperparathyroidism of renal origin: Secondary | ICD-10-CM | POA: Diagnosis not present

## 2021-09-19 DIAGNOSIS — Z0000 Encounter for general adult medical examination without abnormal findings: Secondary | ICD-10-CM | POA: Diagnosis not present

## 2021-09-19 DIAGNOSIS — Z95810 Presence of automatic (implantable) cardiac defibrillator: Secondary | ICD-10-CM

## 2021-09-19 DIAGNOSIS — G4733 Obstructive sleep apnea (adult) (pediatric): Secondary | ICD-10-CM | POA: Diagnosis not present

## 2021-09-19 DIAGNOSIS — I10 Essential (primary) hypertension: Secondary | ICD-10-CM | POA: Diagnosis not present

## 2021-09-19 DIAGNOSIS — I480 Paroxysmal atrial fibrillation: Secondary | ICD-10-CM | POA: Diagnosis not present

## 2021-09-21 ENCOUNTER — Telehealth: Payer: Self-pay

## 2021-09-21 NOTE — Telephone Encounter (Signed)
Remote ICM transmission received.  Attempted call to patient regarding ICM remote transmission and left detailed message per DPR.  Advised to return call for any fluid symptoms or questions. Next ICM remote transmission scheduled 10/24/2021.    

## 2021-09-21 NOTE — Progress Notes (Signed)
EPIC Encounter for ICM Monitoring  Patient Name: Miguel Roach is a 80 y.o. male Date: 09/21/2021 Primary Care Physican: Deland Pretty, MD Primary Cardiologist: Aundra Dubin Electrophysiologist: Lovena Le Nephrologist: [place] (every 3 months) 07/06/2021 Weight: 162 lbs     Attempted call to patient and unable to reach.  Left detailed message per DPR regarding transmission. Transmission reviewed.    Optivol thoracic impedance suggesting normal fluid level.     Prescribed: Furosemide 40 mg take 2 tablets (80 mg total) twice a day alternating with 80 mg AM and 40 mg PM.    Labs: 07/05/2021 Creatinine 4.30, BUN 53, Potassium 3.5, Sodium 143, GFR 13 03/01/2021 Creatinine 4.59, BUN 52, Potassium 3.7, Sodium 143, GFR 12 12/27/2020 Creatinine 4.43, BUN 49, Potassium 3.4, Sodium 140, GFR 13 10/26/2020 Creatinine 4.39, BUN 57, Potassium 3.5, Sodium 146, GFR 13 A complete set of results can be found in Results Review.     Recommendations:  Left voice mail with ICM number and encouraged to call if experiencing any fluid symptoms.   Follow-up plan: ICM clinic phone appointment on 10/24/2021.   91 day device clinic remote transmission 11/28/2021.      EP/Cardiology Office Visits:  Recall 11/02/2021 with Dr Aundra Dubin.  Recall 10/29/2021 with Dr Lovena Le.   Copy of ICM check sent to Dr. Lovena Le.    3 month ICM trend: 09/19/2021.    12-14 Month ICM trend:       Rosalene Billings, RN 09/21/2021 1:48 PM

## 2021-10-24 ENCOUNTER — Ambulatory Visit (INDEPENDENT_AMBULATORY_CARE_PROVIDER_SITE_OTHER): Payer: Medicare PPO

## 2021-10-24 DIAGNOSIS — I5022 Chronic systolic (congestive) heart failure: Secondary | ICD-10-CM | POA: Diagnosis not present

## 2021-10-24 DIAGNOSIS — Z95810 Presence of automatic (implantable) cardiac defibrillator: Secondary | ICD-10-CM

## 2021-10-28 ENCOUNTER — Telehealth: Payer: Self-pay

## 2021-10-28 NOTE — Progress Notes (Signed)
EPIC Encounter for ICM Monitoring  Patient Name: Miguel Roach is a 81 y.o. male Date: 10/28/2021 Primary Care Physican: Deland Pretty, MD Primary Cardiologist: Aundra Dubin Electrophysiologist: Lovena Le Nephrologist: [place] (every 3 months) 07/06/2021 Weight: 162 lbs     Attempted call to patient and unable to reach.  Left detailed message per DPR regarding transmission. Transmission reviewed.    Optivol thoracic impedance suggesting normal fluid level.     Prescribed: Furosemide 40 mg take 2 tablets (80 mg total) twice a day alternating with 80 mg AM and 40 mg PM.    Labs: 07/05/2021 Creatinine 4.30, BUN 53, Potassium 3.5, Sodium 143, GFR 13 03/01/2021 Creatinine 4.59, BUN 52, Potassium 3.7, Sodium 143, GFR 12 12/27/2020 Creatinine 4.43, BUN 49, Potassium 3.4, Sodium 140, GFR 13 10/26/2020 Creatinine 4.39, BUN 57, Potassium 3.5, Sodium 146, GFR 13 A complete set of results can be found in Results Review.     Recommendations:  Left voice mail with ICM number and encouraged to call if experiencing any fluid symptoms.   Follow-up plan: ICM clinic phone appointment on 11/29/2021.   91 day device clinic remote transmission 11/28/2021.      EP/Cardiology Office Visits:  Recall 11/02/2021 with Dr Aundra Dubin.  Recall 10/29/2021 with Dr Lovena Le.   Copy of ICM check sent to Dr. Lovena Le.    3 month ICM trend: 10/24/2021.    12-14 Month ICM trend:     Rosalene Billings, RN 10/28/2021 3:22 PM

## 2021-10-28 NOTE — Telephone Encounter (Signed)
Remote ICM transmission received.  Attempted call to patient regarding ICM remote transmission and left detailed message per DPR.  Advised to return call for any fluid symptoms or questions. Next ICM remote transmission scheduled 11/29/2021.

## 2021-11-22 DIAGNOSIS — H25043 Posterior subcapsular polar age-related cataract, bilateral: Secondary | ICD-10-CM | POA: Diagnosis not present

## 2021-11-22 DIAGNOSIS — H18413 Arcus senilis, bilateral: Secondary | ICD-10-CM | POA: Diagnosis not present

## 2021-11-22 DIAGNOSIS — H2513 Age-related nuclear cataract, bilateral: Secondary | ICD-10-CM | POA: Diagnosis not present

## 2021-11-22 DIAGNOSIS — H2511 Age-related nuclear cataract, right eye: Secondary | ICD-10-CM | POA: Diagnosis not present

## 2021-11-22 DIAGNOSIS — H25013 Cortical age-related cataract, bilateral: Secondary | ICD-10-CM | POA: Diagnosis not present

## 2021-11-28 ENCOUNTER — Ambulatory Visit (INDEPENDENT_AMBULATORY_CARE_PROVIDER_SITE_OTHER): Payer: Medicare PPO

## 2021-11-28 DIAGNOSIS — I428 Other cardiomyopathies: Secondary | ICD-10-CM

## 2021-11-28 LAB — CUP PACEART REMOTE DEVICE CHECK
Battery Remaining Longevity: 75 mo
Battery Voltage: 3 V
Brady Statistic RV Percent Paced: 0.12 %
Date Time Interrogation Session: 20230213033425
HighPow Impedance: 58 Ohm
Implantable Lead Implant Date: 20170208
Implantable Lead Location: 753860
Implantable Lead Model: 6935
Implantable Pulse Generator Implant Date: 20170208
Lead Channel Impedance Value: 304 Ohm
Lead Channel Impedance Value: 361 Ohm
Lead Channel Pacing Threshold Amplitude: 1 V
Lead Channel Pacing Threshold Pulse Width: 0.4 ms
Lead Channel Sensing Intrinsic Amplitude: 12.125 mV
Lead Channel Sensing Intrinsic Amplitude: 12.125 mV
Lead Channel Setting Pacing Amplitude: 2.25 V
Lead Channel Setting Pacing Pulse Width: 0.4 ms
Lead Channel Setting Sensing Sensitivity: 0.3 mV

## 2021-11-29 ENCOUNTER — Ambulatory Visit (INDEPENDENT_AMBULATORY_CARE_PROVIDER_SITE_OTHER): Payer: Medicare PPO

## 2021-11-29 DIAGNOSIS — I5022 Chronic systolic (congestive) heart failure: Secondary | ICD-10-CM

## 2021-11-29 DIAGNOSIS — Z95810 Presence of automatic (implantable) cardiac defibrillator: Secondary | ICD-10-CM | POA: Diagnosis not present

## 2021-12-01 ENCOUNTER — Telehealth: Payer: Self-pay

## 2021-12-01 NOTE — Progress Notes (Signed)
Spoke with patient and heart failure questions reviewed.  Pt asymptomatic for fluid accumulation.  Reports feeling well at this time and voices no complaints.  Transmission reviewed.  He has been eating at restaurants more often and advised restaurant foods are typically high in salt and to avoid if possible.  No changes and encouraged to call if experiencing any fluid symptoms.

## 2021-12-01 NOTE — Progress Notes (Signed)
EPIC Encounter for ICM Monitoring  Patient Name: Miguel Roach is a 81 y.o. male Date: 12/01/2021 Primary Care Physican: Deland Pretty, MD Primary Cardiologist: Aundra Dubin Electrophysiologist: Lovena Le Nephrologist: [place] (every 3 months) 07/06/2021 Weight: 162 lbs     Attempted call to patient and unable to reach.  Left detailed message per DPR regarding transmission. Transmission reviewed.    Optivol thoracic impedance suggesting possible fluid accumulation starting 11/14/2021 and trending back to baseline normal.     Prescribed: Furosemide 40 mg take 2 tablets (80 mg total) twice a day alternating with 80 mg AM and 40 mg PM.    Labs: 07/05/2021 Creatinine 4.30, BUN 53, Potassium 3.5, Sodium 143, GFR 13 03/01/2021 Creatinine 4.59, BUN 52, Potassium 3.7, Sodium 143, GFR 12 12/27/2020 Creatinine 4.43, BUN 49, Potassium 3.4, Sodium 140, GFR 13 10/26/2020 Creatinine 4.39, BUN 57, Potassium 3.5, Sodium 146, GFR 13 A complete set of results can be found in Results Review.     Recommendations: Left voice mail with ICM number and encouraged to call if experiencing any fluid symptoms.  Advised to call offices to schedule physician appointments that were due in January.   Follow-up plan: ICM clinic phone appointment on 01/02/2022.   91 day device clinic remote transmission 02/27/2022.      EP/Cardiology Office Visits:  Recall 11/02/2021 with Dr Aundra Dubin.  Recall 10/29/2021 with Dr Lovena Le.   Copy of ICM check sent to Dr. Lovena Le.    3 month ICM trend: 11/28/2021.    12-14 Month ICM trend:     Rosalene Billings, RN 12/01/2021 10:55 AM

## 2021-12-01 NOTE — Telephone Encounter (Signed)
Remote ICM transmission received.  Attempted call to patient regarding ICM remote transmission and left detailed message per DPR.  Advised to return call for any fluid symptoms or questions. Next ICM remote transmission scheduled 01/02/2022.   ° ° °

## 2021-12-02 DIAGNOSIS — H2511 Age-related nuclear cataract, right eye: Secondary | ICD-10-CM | POA: Diagnosis not present

## 2021-12-02 DIAGNOSIS — H2512 Age-related nuclear cataract, left eye: Secondary | ICD-10-CM | POA: Diagnosis not present

## 2021-12-02 NOTE — Progress Notes (Signed)
Remote ICD transmission.   

## 2021-12-16 DIAGNOSIS — H2512 Age-related nuclear cataract, left eye: Secondary | ICD-10-CM | POA: Diagnosis not present

## 2021-12-21 ENCOUNTER — Other Ambulatory Visit (HOSPITAL_COMMUNITY): Payer: Self-pay | Admitting: Cardiology

## 2021-12-27 DIAGNOSIS — N185 Chronic kidney disease, stage 5: Secondary | ICD-10-CM | POA: Diagnosis not present

## 2022-01-02 ENCOUNTER — Ambulatory Visit (INDEPENDENT_AMBULATORY_CARE_PROVIDER_SITE_OTHER): Payer: Medicare PPO

## 2022-01-02 DIAGNOSIS — N185 Chronic kidney disease, stage 5: Secondary | ICD-10-CM | POA: Diagnosis not present

## 2022-01-02 DIAGNOSIS — I5022 Chronic systolic (congestive) heart failure: Secondary | ICD-10-CM | POA: Diagnosis not present

## 2022-01-02 DIAGNOSIS — I120 Hypertensive chronic kidney disease with stage 5 chronic kidney disease or end stage renal disease: Secondary | ICD-10-CM | POA: Diagnosis not present

## 2022-01-02 DIAGNOSIS — Z95810 Presence of automatic (implantable) cardiac defibrillator: Secondary | ICD-10-CM | POA: Diagnosis not present

## 2022-01-02 DIAGNOSIS — D631 Anemia in chronic kidney disease: Secondary | ICD-10-CM | POA: Diagnosis not present

## 2022-01-02 DIAGNOSIS — N2581 Secondary hyperparathyroidism of renal origin: Secondary | ICD-10-CM | POA: Diagnosis not present

## 2022-01-06 ENCOUNTER — Telehealth: Payer: Self-pay

## 2022-01-06 NOTE — Telephone Encounter (Signed)
Remote ICM transmission received.  Attempted call to patient regarding ICM remote transmission and left detailed message per DPR.  Advised to return call for any fluid symptoms or questions. Next ICM remote transmission scheduled 02/06/2022.   ? ?

## 2022-01-06 NOTE — Progress Notes (Signed)
EPIC Encounter for ICM Monitoring ? ?Patient Name: Miguel Roach is a 81 y.o. male ?Date: 01/06/2022 ?Primary Care Physican: Deland Pretty, MD ?Primary Cardiologist: Aundra Dubin ?Electrophysiologist: Lovena Le ?Nephrologist: [place] (every 3 months) ?07/06/2021 Weight: 162 lbs ?  ?  ?Attempted call to patient and unable to reach.  Left detailed message per DPR regarding transmission. Transmission reviewed.  ?  ?Optivol thoracic impedance normal fluid levels.   ?  ?Prescribed: Furosemide 40 mg take 2 tablets (80 mg total) twice a day alternating with 80 mg AM and 40 mg PM.  ?  ?Labs: ?07/05/2021 Creatinine 4.30, BUN 53, Potassium 3.5, Sodium 143, GFR 13 ?03/01/2021 Creatinine 4.59, BUN 52, Potassium 3.7, Sodium 143, GFR 12 ?12/27/2020 Creatinine 4.43, BUN 49, Potassium 3.4, Sodium 140, GFR 13 ?10/26/2020 Creatinine 4.39, BUN 57, Potassium 3.5, Sodium 146, GFR 13 ?A complete set of results can be found in Results Review.   ?  ?Recommendations: Left voice mail with ICM number and encouraged to call if experiencing any fluid symptoms.   ?  ?Follow-up plan: ICM clinic phone appointment on 02/06/2022.   91 day device clinic remote transmission 02/27/2022.    ?  ?EP/Cardiology Office Visits:  02/13/2022 with Dr Aundra Dubin.  02/03/2022 with Tommye Standard, PA. ?  ?Copy of ICM check sent to Dr. Lovena Le.   ? ?3 month ICM trend: 01/02/2022. ? ? ? ?12-14 Month ICM trend:  ? ? ? ?Rosalene Billings, RN ?01/06/2022 ?2:22 PM ? ?

## 2022-02-02 NOTE — Progress Notes (Signed)
? ?Cardiology Office Note ?Date:  02/02/2022  ?Patient ID:  Miguel Roach, Miguel Roach Jul 28, 1941, MRN [id number] ?PCP:  Deland Pretty, MD  ?Cardiologist:  Dr. Aundra Dubin ?Electrophysiologist: Dr. Lovena Le ?Nephrology: Dr. Posey Pronto ? ?  ?Chief Complaint: annual EP visit ? ?History of Present Illness: ?Miguel Roach is a 81 y.o. male with history of NICM, chronic CHF, ICD, HTN, AFib, CKD IV,  ? ?He comes in today to be seen for Dr. Lovena Le, last seen by him Jan 2022, doing well, discussed Afib burden <0.1%.  No changes were made. ? ?He follows regularly with HF team, last se Dr. Aundra Dubin Sept 2022, he felt well, weight stable, CKD limited his meds, IVCD qith duration 131m only.  Maintained on low dose amio. ? ?TODAY ?He is accompanied by his wife. ?He feels well ?No CP, palpitations or cardiac awareness. ?No dizzy spells, near syncope or syncope. ?Denies SOB ? ? ? ?Device information ?MDT single chamber ICD implanted 11/24/2015 ?+ appropriate therapy, 2018 ?Amiodarone started Feb 2020 with NSVTs and PVCs ? ?Past Medical History:  ?Diagnosis Date  ? Adenomatous colon polyp 2011  ? AICD (automatic cardioverter/defibrillator) present   ? Anal fistula 2003  ? Arthritis   ? CHF (congestive heart failure) [place]   ? CRI (chronic renal insufficiency)   ? creatinine back to normal  ? Diverticulosis   ? ED (erectile dysfunction)   ? Gout   ? Headache   ? Hypertension   ? Internal hemorrhoids   ? Liver lesion 2007  ? Nonischemic cardiomyopathy ([facility]   ? EF 20-25% by echo 2016, At that time 40% RCA stenosis, distal 35% circumflex stenosis  ? Renal cyst 2007  ? "I'm not familiar with that, but I follow up on my creatinine because my kidney function is low." 09/2016  ? Sleep apnea   ? "had test; never followed thru w/getting mask" (11/25/2015)  ? ? ?Past Surgical History:  ?Procedure Laterality Date  ? ANAL FISTULOTOMY  01/2002  ? CARDIAC CATHETERIZATION N/A 04/16/2015  ? Procedure: Right/Left Heart Cath and Coronary Angiography;  Surgeon: HBelva Crome  MD;  [facility];  Service: Cardiovascular;  Laterality: N/A;  ? EP IMPLANTABLE DEVICE N/A 11/24/2015  ? Procedure: ICD Implant;  Surgeon: GEvans Lance MD;  [facility];  Service: Cardiovascular;  Laterality: N/A;  ? ESOPHAGOGASTRODUODENOSCOPY (EGD) WITH PROPOFOL N/A 10/02/2016  ? Procedure: ESOPHAGOGASTRODUODENOSCOPY (EGD) WITH PROPOFOL;  Surgeon: MLadene Artist MD;  [facility];  Service: Endoscopy;  Laterality: N/A;  ? EUS N/A 10/19/2016  ? Procedure: UPPER ENDOSCOPIC ULTRASOUND (EUS) RADIAL;  Surgeon: DMilus Banister MD;  Location: [facility];  Service: Endoscopy;  Laterality: N/A;  ? INSERTION OF ICD  11/24/2015  ? TONSILLECTOMY  ~ 1950  ? ? ?Current Outpatient Medications  ?Medication Sig Dispense Refill  ? allopurinol (ZYLOPRIM) 100 MG tablet TAKE 2 TABLETS BY MOUTH EVERY DAY 180 tablet 3  ? amiodarone (PACERONE) 100 MG tablet TAKE 1 TABLET BY MOUTH EVERY DAY 90 tablet 3  ? carvedilol (COREG) 6.25 MG tablet TAKE 1 TABLET (6.25 MG TOTAL) BY MOUTH 2 (TWO) TIMES DAILY WITH A MEAL. NEEDS APPT 180 tablet 3  ? colchicine 0.6 MG tablet Take 0.6 mg by mouth daily as needed (gout flare up).    ? FERROUS SULFATE PO Take 65 mg by mouth daily.    ? furosemide (LASIX) 40 MG tablet TAKE 2 TABS BY MOUTH EVERY MORNING AND 1 TAB EVERY EVENING ALTERNATING  WITH 2 TABS TWICE A DAY. 315 tablet 3  ? hydrALAZINE (APRESOLINE) 25 MG tablet TAKE 1/2 TABLET BY MOUTH EVERY 8 (EIGHT) HOURS 135 tablet 1  ? isosorbide mononitrate (IMDUR) 30 MG 24 hr tablet TAKE 1 TABLET BY MOUTH EVERY DAY 90 tablet 3  ? ?No current facility-administered medications for this visit.  ? ? ?Allergies:   Benicar [olmesartan]  ? ?Social History:  The patient  reports that he has never smoked. He has never used smokeless tobacco. He reports that he does not drink alcohol and does not use drugs.  ? ?Family History:  The patient's family history includes Breast cancer in his sister; Clotting disorder in an other family  member; Colon polyps in his brother; Diabetes in his father, sister, and sister; Heart failure (age of onset: 2) in his mother; Sickle cell anemia in his brother. ? ?ROS:  Please see the history of present illness.    ?All other systems are reviewed and otherwise negative.  ? ?PHYSICAL EXAM:  ?VS:  There were no vitals taken for this visit. BMI: There is no height or weight on file to calculate BMI. ?Well nourished, well developed, in no acute distress ?HEENT: normocephalic, atraumatic ?Neck: no JVD, carotid bruits or masses ?Cardiac:  RRR; no significant murmurs, no rubs, or gallops ?Lungs:  CTA b/l, no wheezing, rhonchi or rales ?Abd: soft, nontender ?MS: no deformity or atrophy ?Ext: no edema ?Skin: warm and dry, no rash ?Neuro:  No gross deficits appreciated ?Psych: euthymic mood, full affect ? ?ICD site is stable, no tethering or discomfort ? ? ?EKG:  not done tdoay ? ?Device interrogation done today and reviewed by myself:  ?Battery and lead measurements are good ?No VT ?There are 4 "AF" episodes ?EGMs not completely convincing, suspect perhaps PACs/PVCs, one may be true Afib ?Longest 14 minutes, burden <0.1% ? ? ?11/23/2020: TTE ? 1. Global hypokinesis with septal-lateral dyssynchrony. Left ventricular  ?ejection fraction, by estimation, is 25 to 30%. The left ventricle has  ?severely decreased function. The left ventricle demonstrates global  ?hypokinesis. The left ventricular  ?internal cavity size was severely dilated. There is mild concentric left  ?ventricular hypertrophy. Left ventricular diastolic parameters are  ?consistent with Grade I diastolic dysfunction (impaired relaxation).  ?Elevated left ventricular end-diastolic  ?pressure.  ? 2. Right ventricular systolic function is low normal. The right  ?ventricular size is normal.  ? 3. Left atrial size was mildly dilated.  ? 4. Cannot rule out PFO with right to left flow by color flow Doppler.  ? 5. The mitral valve is normal in structure. Trivial  mitral valve  ?regurgitation. No evidence of mitral stenosis.  ? 6. The aortic valve is tricuspid. Aortic valve regurgitation is mild to  ?moderate. No aortic stenosis is present. Aortic regurgitation PHT measures  ?571 msec.  ? 7. Aortic dilatation noted. 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  ? 8. The inferior vena cava is normal in size with greater than 50%  ?respiratory variability, suggesting right atrial pressure of 3 mmHg.  ? ? ?Recent Labs: ?07/05/2021: ALT 8; BUN 53; Creatinine, Ser 4.30; Potassium 3.5; Sodium 143; TSH 1.577  ?No results found for requested labs within last 8760 hours.  ? ?CrCl cannot be calculated (Patient's most recent lab result is older than the maximum 21 days allowed.).  ? ?Wt Readings from Last 3 Encounters:  ?07/05/21 162 lb 12.8 oz (73.8 kg)  ?03/01/21 164 lb (74.4 kg)  ?12/27/20  170 lb (77.1 kg)  ?  ? ?Other studies reviewed: ?Additional studies/records reviewed today include: summarized above ? ?ASSESSMENT AND PLAN: ? ?ICD ?Intact function ?No programming changes made ? ?VT ?On low dose amiodarone ?Needs amio labs, though he mentions he sees Dr.McLean soon and would like to wait for labs to be done there ?No VT ? ?NICM ?Chronic CHF ?Renal function limits GDMT ?C/w Dr. Felizardo Hoffmann team ?OptiVol looks great, no symptoms or exam findings of volume OL ?C/w Dr. Aundra Dubin and Dr. Posey Pronto ? ?Paroxysmal Afib, seems was remotely by notes ?On low dose amiodarone ?<0.1 % burden, not on a/c, not convinced all noted episodes were true AF with "leadless" EGM/markers  only ? ? ?Disposition: F/u with remotes as usual, in clinic with EP in a year, sooner if needed ? ?Current medicines are reviewed at length with the patient today.  The patient did not have any concerns regarding medicines. ? ?Signed, ?Tommye Standard, PA-C ?02/02/2022 1:02 PM    ? ?[facility] ?[address] ?[address] ?[place] [address] ?[phone] (office)  ?[phone] (fax) ? ? ?

## 2022-02-03 ENCOUNTER — Encounter: Payer: Self-pay | Admitting: Physician Assistant

## 2022-02-03 ENCOUNTER — Ambulatory Visit: Payer: Medicare PPO | Admitting: Physician Assistant

## 2022-02-03 VITALS — BP 110/68 | HR 60 | Ht 71.0 in | Wt 163.0 lb

## 2022-02-03 DIAGNOSIS — Z95810 Presence of automatic (implantable) cardiac defibrillator: Secondary | ICD-10-CM

## 2022-02-03 DIAGNOSIS — I472 Ventricular tachycardia, unspecified: Secondary | ICD-10-CM | POA: Diagnosis not present

## 2022-02-03 DIAGNOSIS — I428 Other cardiomyopathies: Secondary | ICD-10-CM | POA: Diagnosis not present

## 2022-02-03 DIAGNOSIS — I5022 Chronic systolic (congestive) heart failure: Secondary | ICD-10-CM

## 2022-02-03 LAB — CUP PACEART INCLINIC DEVICE CHECK
Battery Remaining Longevity: 70 mo
Battery Voltage: 2.99 V
Brady Statistic RV Percent Paced: 0.14 %
Date Time Interrogation Session: 20230421112947
HighPow Impedance: 67 Ohm
Implantable Lead Implant Date: 20170208
Implantable Lead Location: 753860
Implantable Lead Model: 6935
Implantable Pulse Generator Implant Date: 20170208
Lead Channel Impedance Value: 304 Ohm
Lead Channel Impedance Value: 399 Ohm
Lead Channel Pacing Threshold Amplitude: 1.125 V
Lead Channel Pacing Threshold Pulse Width: 0.4 ms
Lead Channel Sensing Intrinsic Amplitude: 14.5 mV
Lead Channel Sensing Intrinsic Amplitude: 9.625 mV
Lead Channel Setting Pacing Amplitude: 2.25 V
Lead Channel Setting Pacing Pulse Width: 0.4 ms
Lead Channel Setting Sensing Sensitivity: 0.3 mV

## 2022-02-03 NOTE — Patient Instructions (Signed)
Medication Instructions:    Your physician recommends that you continue on your current medications as directed. Please refer to the Current Medication list given to you today.  *If you need a refill on your cardiac medications before your next appointment, please call your pharmacy*   Lab Work: NONE ORDERED  TODAY    If you have labs (blood work) drawn today and your tests are completely normal, you will receive your results only by: MyChart Message (if you have MyChart) OR A paper copy in the mail If you have any lab test that is abnormal or we need to change your treatment, we will call you to review the results.   Testing/Procedures: NONE ORDERED  TODAY   Follow-Up: At [facility], you and your health needs are our priority.  As part of our continuing mission to provide you with exceptional heart care, we have created designated Provider Care Teams.  These Care Teams include your primary Cardiologist (physician) and Advanced Practice Providers (APPs -  Physician Assistants and Nurse Practitioners) who all work together to provide you with the care you need, when you need it.  We recommend signing up for the patient portal called "MyChart".  Sign up information is provided on this After Visit Summary.  MyChart is used to connect with patients for Virtual Visits (Telemedicine).  Patients are able to view lab/test results, encounter notes, upcoming appointments, etc.  Non-urgent messages can be sent to your provider as well.   To learn more about what you can do with MyChart, go to [email or web address].    Your next appointment:   1 year(s)  The format for your next appointment:   In Person  Provider:   You may see Dr.Taylor  or one of the following Advanced Practice Providers on your designated Care Team:   Renee Ursuy, PA-C Michael "Andy" Tillery, PA-C{      Other Instructions   Important Information About Sugar       

## 2022-02-06 ENCOUNTER — Ambulatory Visit (INDEPENDENT_AMBULATORY_CARE_PROVIDER_SITE_OTHER): Payer: Medicare PPO

## 2022-02-06 DIAGNOSIS — I5022 Chronic systolic (congestive) heart failure: Secondary | ICD-10-CM

## 2022-02-06 DIAGNOSIS — Z95810 Presence of automatic (implantable) cardiac defibrillator: Secondary | ICD-10-CM

## 2022-02-06 NOTE — Progress Notes (Signed)
EPIC Encounter for ICM Monitoring ? ?Patient Name: Miguel Roach is a 81 y.o. male ?Date: 02/06/2022 ?Primary Care Physican: Deland Pretty, MD ?Primary Cardiologist: Aundra Dubin ?Electrophysiologist: Lovena Le ?Nephrologist: [place] (every 3 months) ?02/06/2022 Weight: 163 lbs ?  ?  ?Spoke with patient and heart failure questions reviewed.  Pt asymptomatic for fluid accumulation.  He has been eating at restaurants more often in last week. ?  ?Optivol thoracic impedance suggesting possible fluid accumulation starting 4/19.   ?  ?Prescribed: Furosemide 40 mg take 2 tablets (80 mg total) twice a day alternating with 80 mg AM and 40 mg PM.  ?  ?Labs: ?07/05/2021 Creatinine 4.30, BUN 53, Potassium 3.5, Sodium 143, GFR 13 ?03/01/2021 Creatinine 4.59, BUN 52, Potassium 3.7, Sodium 143, GFR 12 ?12/27/2020 Creatinine 4.43, BUN 49, Potassium 3.4, Sodium 140, GFR 13 ?10/26/2020 Creatinine 4.39, BUN 57, Potassium 3.5, Sodium 146, GFR 13 ?A complete set of results can be found in Results Review.   ?  ?Recommendations:  Advised to limit salt intake and avoid restaurants foods if possible.  ?  ?Follow-up plan: ICM clinic phone appointment on 02/20/2022 to recheck fluid levels (will have fluid check in office 5/1).   91 day device clinic remote transmission 02/27/2022.    ?  ?EP/Cardiology Office Visits:  02/13/2022 with Dr Aundra Dubin for BP check.  Recall 01/29/2023 with Tommye Standard, PA. ?  ?Copy of ICM check sent to Dr. Lovena Le.   ? ?3 month ICM trend: 02/06/2022. ? ? ? ?12-14 Month ICM trend:  ? ? ? ?Rosalene Billings, RN ?02/06/2022 ?10:52 AM ? ?

## 2022-02-13 ENCOUNTER — Ambulatory Visit (HOSPITAL_COMMUNITY)
Admission: RE | Admit: 2022-02-13 | Discharge: 2022-02-13 | Disposition: A | Discharge status: Home / Self Care | Payer: Medicare PPO | Source: Ambulatory Visit | Attending: Cardiology | Admitting: Cardiology

## 2022-02-13 ENCOUNTER — Ambulatory Visit (INDEPENDENT_AMBULATORY_CARE_PROVIDER_SITE_OTHER): Payer: Medicare PPO

## 2022-02-13 VITALS — BP 116/66 | HR 64 | Ht 71.0 in | Wt 165.8 lb

## 2022-02-13 DIAGNOSIS — Z79899 Other long term (current) drug therapy: Secondary | ICD-10-CM | POA: Diagnosis not present

## 2022-02-13 DIAGNOSIS — I428 Other cardiomyopathies: Secondary | ICD-10-CM | POA: Diagnosis not present

## 2022-02-13 DIAGNOSIS — I472 Ventricular tachycardia, unspecified: Secondary | ICD-10-CM | POA: Diagnosis not present

## 2022-02-13 DIAGNOSIS — N184 Chronic kidney disease, stage 4 (severe): Secondary | ICD-10-CM | POA: Insufficient documentation

## 2022-02-13 DIAGNOSIS — I480 Paroxysmal atrial fibrillation: Secondary | ICD-10-CM | POA: Insufficient documentation

## 2022-02-13 DIAGNOSIS — M109 Gout, unspecified: Secondary | ICD-10-CM | POA: Diagnosis not present

## 2022-02-13 DIAGNOSIS — G4733 Obstructive sleep apnea (adult) (pediatric): Secondary | ICD-10-CM | POA: Insufficient documentation

## 2022-02-13 DIAGNOSIS — I2510 Atherosclerotic heart disease of native coronary artery without angina pectoris: Secondary | ICD-10-CM | POA: Insufficient documentation

## 2022-02-13 DIAGNOSIS — Z95810 Presence of automatic (implantable) cardiac defibrillator: Secondary | ICD-10-CM

## 2022-02-13 DIAGNOSIS — I5022 Chronic systolic (congestive) heart failure: Secondary | ICD-10-CM | POA: Diagnosis not present

## 2022-02-13 LAB — COMPREHENSIVE METABOLIC PANEL
ALT: 14 U/L (ref 0–44)
AST: 13 U/L — ABNORMAL LOW (ref 15–41)
Albumin: 3.4 g/dL — ABNORMAL LOW (ref 3.5–5.0)
Alkaline Phosphatase: 61 U/L (ref 38–126)
Anion gap: 5 (ref 5–15)
BUN: 42 mg/dL — ABNORMAL HIGH (ref 8–23)
CO2: 27 mmol/L (ref 22–32)
Calcium: 9.1 mg/dL (ref 8.9–10.3)
Chloride: 110 mmol/L (ref 98–111)
Creatinine, Ser: 4.49 mg/dL — ABNORMAL HIGH (ref 0.61–1.24)
GFR, Estimated: 13 mL/min — ABNORMAL LOW (ref >60)
Glucose, Bld: 100 mg/dL — ABNORMAL HIGH (ref 70–99)
Potassium: 4.1 mmol/L (ref 3.5–5.1)
Sodium: 142 mmol/L (ref 135–145)
Total Bilirubin: 0.7 mg/dL (ref 0.3–1.2)
Total Protein: 6 g/dL — ABNORMAL LOW (ref 6.5–8.1)

## 2022-02-13 LAB — TSH: TSH: 2.369 u[IU]/mL (ref 0.350–4.500)

## 2022-02-13 MED ORDER — CARVEDILOL 6.25 MG PO TABS: 9.3750 mg | ORAL_TABLET | Freq: Two times a day (BID) | ORAL | 3 refills | Status: DC

## 2022-02-13 NOTE — Patient Instructions (Signed)
Medication Changes: ? ?Increase Carvedilol to 9.375 mg (1 1/2 Tab) Twice daily ? ? ?Lab Work: ? ?Labs done today, your results will be available in MyChart, we will contact you for abnormal readings. ? ? ?Testing/Procedures: ? ?none ? ?Referrals: ? ?none ? ?Special Instructions // Education: ? ?none ? ?Follow-Up in: 4 months  ? ?At the [facility], you and your health needs are our priority. We have a designated team specialized in the treatment of Heart Failure. This Care Team includes your primary Heart Failure Specialized Cardiologist (physician), Advanced Practice Providers (APPs- Physician Assistants and Nurse Practitioners), and Pharmacist who all work together to provide you with the care you need, when you need it.  ? ?You may see any of the following providers on your designated Care Team at your next follow up: ? ?Dr Glori Bickers ?Dr [name] ?Darrick Grinder, NP ?Lyda Jester, PA ?Jessica Milford,NP ?Marlyce Huge, PA ?Audry Riles, PharmD ? ? ?Please be sure to bring in all your medications bottles to every appointment.  ? ?Need to Contact us: ? ?If you have any questions or concerns before your next appointment please send [place] a message through [place] or call our office at [phone].   ? ?TO LEAVE A MESSAGE FOR THE NURSE SELECT OPTION 2, PLEASE LEAVE A MESSAGE INCLUDING: ?YOUR NAME ?DATE OF BIRTH ?CALL BACK NUMBER ?REASON FOR CALL**this is important as we prioritize the call backs ? ?YOU WILL RECEIVE A CALL BACK THE SAME DAY AS LONG AS YOU CALL BEFORE 4:00 PM ? ? ?

## 2022-02-13 NOTE — Progress Notes (Signed)
? ?ID:  Jo, Cerone Sep 30, 1941, MRN [id number]   ?Provider location: [facility] ?Type of Visit: Established patient  ? ?PCP:  Deland Pretty, MD  ?Cardiologist:  Dr. Aundra Dubin ?  ?History of Present Illness: ?Miguel Roach is a 81 y.o. male who has a history of chronic systolic CHF/nonischemic cardiomyopathy and CKD. He actually had an echo back in 2010 with EF 40-45% at the time.  He says that this really was not followed up upon by a cardiologist and that he did well for a number of years after that. In 6/16, he was admitted with acute systolic CHF.  Echo showed EF down to 20-25%.  Cardiac cath showed nonobstructive CAD.  His Lasix has been gradually increased.  This has brought his weight down and improved his breathing, but creatinine has risen. He had been on olmesartan but this was stopped because he thought it was causing cough.  He was then put on losartan, but this was stopped with rise in creatinine. Also intolerant to Bidil with orthostatic symptoms.  He was started on digoxin. ? ?Echo in 12/16 showed persistently low EF, 15-20%.  Medtronic ICD was placed in 2/17.  Repeat echo 1/18 showed EF 25% with diffuse hypokinesis.  ? ?In 3/18, he had an episode of about 10 minutes atrial fibrillation noted on his ICD.  He also had 1 episode of VT terminated by an appropriate discharge.  He had push-mowed his grass and was very fatigued when he had the shock.  Since then, he has had no further arrhythmias.  ? ?Echo in 6/19 showed EF 25-30% with mild LV dilation and mild-moderate AI, normal RV size with mildly decreased systolic function, IVC normal.  ? ?Admitted 1/27 - 11/17/2018 with hypotension and found to have UTI/sepsis. Treated on ABX and IVF. Eventually transitioned back to po diuretics with HF team following. 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 Noted to have increased NSVT and PVCs, started on amiodarone.  Zio patch in 6/20 showed occasional PVCs, burden low.  ? ?Echo in 8/20 showed  EF 35%, severe LV dilation, normal RV size and systolic function, mild-moderate AI.   Echo in 2/22 showed EF 25-30%, severe LV dilation, septal-lateral dyssynchrony, mild LVH, low normal RV function, mild-moderate AI.  ? ?Patient has been doing well symptomatically.  His energy level has been good.  He walks in his neighborhood for about a mile and goes to the [facility].  No dyspnea walking on flat ground, no orthopnea/PND.  No chest pain.  No lightheadedness.  ? ?ECG (personally reviewed): NSR, IVCD 140 msec.  ? ?Medtronic device interrogation: stable thoracic impedance, no VT/AF.  ? ?Labs (6/16): BNP 1954, TSH normal ?Labs (7/16): K 4.1, creatinine 1.72 ?Labs (8/16): creatinine 2.3 ?Labs (9/16): digoxin 0.3, K 4, creatinine 2.14, SPEP negative, UPEP negative ?Labs (10/16): K 4.6, creatinine 1.62 ?Labs (11/16): K 4.4, creatinine 2.33, digoxin 0.5 ?Labs (12/16): creatinine 2.1 (nephrology) ?Labs (1/17): digoxin 0.6, uric acid 10.4 ?Labs (2/17): K 4, creatinine 2.13, hgb 11.6 ?Labs (5/17): K 3.8, creatinine 2.22, digoxin 0.7 ?Labs (9/17): K 4.4, creatinine 2.88, LFTs normal, digoxin 1.0, hgb 11.8, LDL 88, HDL 50 ?Labs (1/18): digoxin 0.5 ?Labs (3/18): K 4.4, creatinine 3.17 ?Labs (4/18): K 4.3, creatinine 3.39, digoxin 0.6 ?Labs (10/18): K 4.5, creatinine 3.65, digoxin 0.4 ?Labs (11/18): K 4, creatinine 3.24, digoxin 0.6 ?Labs (12/18): digoxin < 0.2 ?Labs (1/19): K 3.8, creatinine 3.15 ?Labs (2/19): K 4.0, creatinine 3.23 ?Labs (3/19): digoxin 0.4, K 3.8, creatinine  3.19 ?Labs (6/19): digoxin 0.3, K 3.9, creatinine 3.12 ?Labs (9/19): K 3.9, creatinine 3.58, digoxin 0.3, LDL 91 ?Labs (3/20): K 4, creatinine 3.71 ?Labs (6/20): K 3.9, creatinine 3.94, LFTs normal, TSH normal ?Labs (8/20): K 3.7, creatinine 4.08, LFTs normal, TSH normal ?Labs (1/22): K 3.5, creatinine 4.34, LFTs normal ?Labs (3/22): K 3.4, creatinine 4.43 ?Labs (5/22): K 3.7, creatinine 4.59, LFTs normal, TSH normal ?Labs (9/22): K 3.5, creatinine 4.3, TSH  normal ? ?PMH: ?1. CKD: Stage IV.  Follows with Dr Posey Pronto.  ?2. Gout ?3. Chronic systolic CHF: Nonischemic cardiomyopathy.  EF 40-45% in 2010.  Echo (6/16) with EF 20-25%, severe LV dilation, mild MR, mild AI, moderate LAE.  LHC/RHC (6/16) with nonobstructive CAD; mean RA 2, RV 48/1, PA 47/18, mean PCWP 12, CI 3.2.  CPX (10/16) with peak VO2 13.1 (50% predicted), VE/VCO2 slope 39.3, RER 1.22 => moderate to severe functional limitation.  Echo (12/16) with EF 15-20%, severe LV dilation, diffuse hypokinesis, moderate AI, mild MR, normal RV size and systolic function. Medtronic ICD.  ?- CPX (11/17): Submaximal with RER 0.95, VE/VCO2 slope 27, peak VO2 18.2.  Suspect no more than mild HF limitation.  ?- Echo (1/18): EF 25%, diffuse hypokinesis, normal RV size and systolic function, mild AI.  ?- Echo (6/19): EF 25-30% with mild LV dilation and mild-moderate AI, normal RV size with mildly decreased systolic function, IVC normal.  ?- Echo (8/20): EF 35%, severe LV dilation, normal RV size and systolic function, mild-moderate AI.  ?- Echo (2/22): EF 25-30%, severe LV dilation, septal-lateral dyssynchrony, mild LVH, low normal RV function, mild-moderate AI.  ?4. ACEI cough.  ?5. Pleural effusion: Thoracentesis on right in 8/16.  It was a transudate, likely due to CHF.  ?6. OSA: To start Bipap.  ?7. Esophageal duplication cyst.  ?8. High resolution CT chest 12/17: No evidence for interstitial lung disease.  ?9. Atrial fibrillation: Paroxysmal, 1 10 minute episode in 3/18.  ?10. VT: ICD discharge 3/18.  ?11. 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  ?- Zio patch in 6/20: occasional PVCs, low burden overall.  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 ?SH: Married, retired Pharmacist, hospital, never smoked, no ETOH.  ? ?FH: Mother with "weak heart." Brother with sickle cell anemia.  ? ?ROS: All systems reviewed and negative except as per HPI.  ? ?Current Outpatient Medications  ?Medication Sig Dispense Refill  ? allopurinol (ZYLOPRIM) 100 MG tablet Take 100 mg by mouth daily.    ?  amiodarone (PACERONE) 100 MG tablet TAKE 1 TABLET BY MOUTH EVERY DAY 90 tablet 3  ? colchicine 0.6 MG tablet Take 0.6 mg by mouth daily as needed (gout flare up).    ? FERROUS SULFATE PO Take 65 mg by mouth daily.    ? furosemide (LASIX) 40 MG tablet TAKE 2 TABS BY MOUTH EVERY MORNING AND 1 TAB EVERY EVENING ALTERNATING WITH 2 TABS TWICE A DAY. 315 tablet 3  ? hydrALAZINE (APRESOLINE) 25 MG tablet TAKE 1/2 TABLET BY MOUTH EVERY 8 (EIGHT) HOURS 135 tablet 1  ? isosorbide mononitrate (IMDUR) 30 MG 24 hr tablet TAKE 1 TABLET BY MOUTH EVERY DAY 90 tablet 3  ? carvedilol (COREG) 6.25 MG tablet Take 1.5 tablets (9.375 mg total) by mouth 2 (two) times daily with a meal. 180 tablet 3  ? ?No current facility-administered medications for this encounter.  ? ?Exam:   ?BP 116/66   Pulse 64   Ht '5\' 11"'$  (1.803 m)   Wt 75.2 kg (165 lb 12.8 oz)   SpO2 99%  BMI 23.12 kg/m?  ?General: NAD ?Neck: No JVD, no thyromegaly or thyroid nodule.  ?Lungs: Clear to auscultation bilaterally with normal respiratory effort. ?CV: Nondisplaced PMI.  Heart regular S1/S2, no S3/S4, 1/6 SEM RUSB.  No peripheral edema.  No carotid bruit.  Normal pedal pulses.  ?Abdomen: Soft, nontender, no hepatosplenomegaly, no distention.  ?Skin: Intact without lesions or rashes.  ?Neurologic: Alert and oriented x 3.  ?Psych: Normal affect. ?Extremities: No clubbing or cyanosis.  ?HEENT: Normal.  ? ?Assessment/Plan: ?1. Chronic systolic CHF: Nonischemic cardiomyopathy.  CO preserved on RHC in 6/16, but 10/16 CPX showed moderate to severe functional impairment (seems worse than his symptoms would suggest).  However, repeat CPX in 11/17, though submaximal, suggested no more than mild HF limitation. No significant CAD with coronary angiography in 6/16.  No hx heavy ETOH or drug abuse. No marked HTN.  Negative SPEP/UPEP.  Possible prior myocarditis.  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  He now has Medtronic ICD.  Echo in 6/19  showed EF 25-30% (stable) with mild-moderate AI and mildly decreased RV systolic function.  Echo in 8/20 showed EF mildly higher at 35%, severe LV dilation, normal RV, mild-moderate AI.  Echo in 2/22 showed EF back

## 2022-02-15 NOTE — Progress Notes (Signed)
EPIC Encounter for ICM Monitoring ? ?Patient Name: Miguel Roach is a 81 y.o. male ?Date: 02/15/2022 ?Primary Care Physican: Deland Pretty, MD ?Primary Cardiologist: Aundra Dubin ?Electrophysiologist: Lovena Le ?Nephrologist: [place] (every 3 months) ?02/06/2022 Weight: 163 lbs ?  ?  ?Spoke with patient and heart failure questions reviewed.  Pt asymptomatic for fluid accumulation.   ?  ?Optivol thoracic impedance suggesting fluid levels returned to normal.   ?  ?Prescribed: Furosemide 40 mg take 2 tablets (80 mg total) twice a day alternating with 80 mg AM and 40 mg PM.  ?  ?Labs: ?02/13/2022 Creatinine 4.49, BUN 42, Potassium 4.1, Sodium 142, GFR 13 ?07/05/2021 Creatinine 4.30, BUN 53, Potassium 3.5, Sodium 143, GFR 13 ?03/01/2021 Creatinine 4.59, BUN 52, Potassium 3.7, Sodium 143, GFR 12 ?12/27/2020 Creatinine 4.43, BUN 49, Potassium 3.4, Sodium 140, GFR 13 ?10/26/2020 Creatinine 4.39, BUN 57, Potassium 3.5, Sodium 146, GFR 13 ?A complete set of results can be found in Results Review.   ?  ?Recommendations: No changes and encouraged to call if experiencing any fluid symptoms. ?  ?Follow-up plan: ICM clinic phone appointment on 03/14/2022.   91 day device clinic remote transmission 02/27/2022.    ?  ?EP/Cardiology Office Visits:  06/13/2022 with HF clinic PA/NP.  Recall 01/29/2023 with Tommye Standard, PA. ?  ?Copy of ICM check sent to Dr. Lovena Le. ? ?3 month ICM trend: 02/13/2022. ? ? ? ?12-14 Month ICM trend:  ? ? ? ?Rosalene Billings, RN ?02/15/2022 ?2:55 PM ? ?

## 2022-02-24 ENCOUNTER — Other Ambulatory Visit: Payer: Self-pay | Admitting: Cardiology

## 2022-02-27 ENCOUNTER — Ambulatory Visit (INDEPENDENT_AMBULATORY_CARE_PROVIDER_SITE_OTHER): Payer: Medicare PPO

## 2022-02-27 DIAGNOSIS — I428 Other cardiomyopathies: Secondary | ICD-10-CM | POA: Diagnosis not present

## 2022-02-28 LAB — CUP PACEART REMOTE DEVICE CHECK
Battery Remaining Longevity: 69 mo
Battery Voltage: 2.99 V
Brady Statistic RV Percent Paced: 0.34 %
Date Time Interrogation Session: 20230515012504
HighPow Impedance: 62 Ohm
Implantable Lead Implant Date: 20170208
Implantable Lead Location: 753860
Implantable Lead Model: 6935
Implantable Pulse Generator Implant Date: 20170208
Lead Channel Impedance Value: 304 Ohm
Lead Channel Impedance Value: 361 Ohm
Lead Channel Pacing Threshold Amplitude: 1.125 V
Lead Channel Pacing Threshold Pulse Width: 0.4 ms
Lead Channel Sensing Intrinsic Amplitude: 10.375 mV
Lead Channel Sensing Intrinsic Amplitude: 10.375 mV
Lead Channel Setting Pacing Amplitude: 2.25 V
Lead Channel Setting Pacing Pulse Width: 0.4 ms
Lead Channel Setting Sensing Sensitivity: 0.3 mV

## 2022-03-14 ENCOUNTER — Ambulatory Visit (INDEPENDENT_AMBULATORY_CARE_PROVIDER_SITE_OTHER): Payer: Medicare PPO

## 2022-03-14 DIAGNOSIS — Z95810 Presence of automatic (implantable) cardiac defibrillator: Secondary | ICD-10-CM | POA: Diagnosis not present

## 2022-03-14 DIAGNOSIS — I5022 Chronic systolic (congestive) heart failure: Secondary | ICD-10-CM

## 2022-03-17 NOTE — Progress Notes (Signed)
EPIC Encounter for ICM Monitoring  Patient Name: Miguel Roach is a 81 y.o. male Date: 03/17/2022 Primary Care Physican: Deland Pretty, MD Primary Cardiologist: Aundra Dubin Electrophysiologist: Lovena Le Nephrologist: [place] (every 3 months) 02/06/2022 Weight: 163 lbs     Spoke with patient and heart failure questions reviewed.  Pt asymptomatic for fluid accumulation.     Optivol thoracic impedance suggesting normal fluid levels.       Prescribed: Furosemide 40 mg take 2 tablets (80 mg total) twice a day alternating with 80 mg AM and 40 mg PM.    Labs: 02/13/2022 Creatinine 4.49, BUN 42, Potassium 4.1, Sodium 142, GFR 13 07/05/2021 Creatinine 4.30, BUN 53, Potassium 3.5, Sodium 143, GFR 13 03/01/2021 Creatinine 4.59, BUN 52, Potassium 3.7, Sodium 143, GFR 12 12/27/2020 Creatinine 4.43, BUN 49, Potassium 3.4, Sodium 140, GFR 13 10/26/2020 Creatinine 4.39, BUN 57, Potassium 3.5, Sodium 146, GFR 13 A complete set of results can be found in Results Review.     Recommendations: No changes and encouraged to call if experiencing any fluid symptoms.   Follow-up plan: ICM clinic phone appointment on 04/19/2022.   91 day device clinic remote transmission 05/29/2022.      EP/Cardiology Office Visits:  06/13/2022 with HF clinic PA/NP.  Recall 01/29/2023 with Tommye Standard, PA.   Copy of ICM check sent to Dr. Lovena Le.  3 month ICM trend: 03/14/2022.    12-14 Month ICM trend:     Rosalene Billings, RN 03/17/2022 3:38 PM

## 2022-03-17 NOTE — Progress Notes (Signed)
Remote ICD transmission.   

## 2022-04-17 ENCOUNTER — Other Ambulatory Visit (HOSPITAL_COMMUNITY): Payer: Self-pay | Admitting: Cardiology

## 2022-04-17 DIAGNOSIS — I5022 Chronic systolic (congestive) heart failure: Secondary | ICD-10-CM

## 2022-04-19 ENCOUNTER — Ambulatory Visit (INDEPENDENT_AMBULATORY_CARE_PROVIDER_SITE_OTHER): Payer: Medicare PPO

## 2022-04-19 DIAGNOSIS — Z95810 Presence of automatic (implantable) cardiac defibrillator: Secondary | ICD-10-CM | POA: Diagnosis not present

## 2022-04-19 DIAGNOSIS — I5022 Chronic systolic (congestive) heart failure: Secondary | ICD-10-CM | POA: Diagnosis not present

## 2022-04-19 NOTE — Progress Notes (Signed)
EPIC Encounter for ICM Monitoring  Patient Name: Miguel Roach is a 81 y.o. male Date: 04/19/2022 Primary Care Physican: Deland Pretty, MD Primary Cardiologist: Aundra Dubin Electrophysiologist: Lovena Le Nephrologist: [place] (every 3 months) 02/06/2022 Weight: 163 lbs 04/19/2022 Weight: 158-159 lbs     Spoke with patient and heart failure questions reviewed.  Pt asymptomatic for fluid accumulation.  He has been eating at restaurants more in the last couple of weeks.   Optivol thoracic impedance normal fluid levels but was suggesting possible fluid accumulation from 6/16-6/29.   Prescribed: Furosemide 40 mg take 2 tablets (80 mg total) twice a day alternating with 80 mg AM and 40 mg PM.    Labs: 02/13/2022 Creatinine 4.49, BUN 42, Potassium 4.1, Sodium 142, GFR 13 07/05/2021 Creatinine 4.30, BUN 53, Potassium 3.5, Sodium 143, GFR 13 03/01/2021 Creatinine 4.59, BUN 52, Potassium 3.7, Sodium 143, GFR 12 12/27/2020 Creatinine 4.43, BUN 49, Potassium 3.4, Sodium 140, GFR 13 10/26/2020 Creatinine 4.39, BUN 57, Potassium 3.5, Sodium 146, GFR 13 A complete set of results can be found in Results Review.     Recommendations: No changes and encouraged to call if experiencing any fluid symptoms.   Follow-up plan: ICM clinic phone appointment on 05/22/2022.   91 day device clinic remote transmission 05/29/2022.      EP/Cardiology Office Visits:  06/13/2022 with HF clinic PA/NP.  Recall 01/29/2023 with Tommye Standard, PA.   Copy of ICM check sent to Dr. Lovena Le.  3 month ICM trend: 04/19/2022.    12-14 Month ICM trend:     Rosalene Billings, RN 04/19/2022 4:47 PM

## 2022-04-26 ENCOUNTER — Other Ambulatory Visit (HOSPITAL_COMMUNITY): Payer: Self-pay | Admitting: Cardiology

## 2022-05-22 ENCOUNTER — Ambulatory Visit (INDEPENDENT_AMBULATORY_CARE_PROVIDER_SITE_OTHER): Payer: Medicare PPO

## 2022-05-22 DIAGNOSIS — I5022 Chronic systolic (congestive) heart failure: Secondary | ICD-10-CM

## 2022-05-22 DIAGNOSIS — Z95810 Presence of automatic (implantable) cardiac defibrillator: Secondary | ICD-10-CM

## 2022-05-24 ENCOUNTER — Telehealth: Payer: Self-pay

## 2022-05-24 NOTE — Progress Notes (Signed)
EPIC Encounter for ICM Monitoring  Patient Name: Miguel Roach is a 81 y.o. male Date: 05/24/2022 Primary Care Physican: Deland Pretty, MD Primary Cardiologist: Aundra Dubin Electrophysiologist: Lovena Le Nephrologist: [place] (every 3 months) 02/06/2022 Weight: 163 lbs 04/19/2022 Weight: 158-159 lbs     Attempted call to patient and unable to reach.  Left detailed message per DPR regarding transmission. Transmission reviewed.    Optivol thoracic impedance suggesting normal fluid levels.   Prescribed: Furosemide 40 mg take 2 tablets (80 mg total) twice a day alternating with 80 mg AM and 40 mg PM.    Labs: 06/13/2022 BMET Scheduled 02/13/2022 Creatinine 4.49, BUN 42, Potassium 4.1, Sodium 142, GFR 13 07/05/2021 Creatinine 4.30, BUN 53, Potassium 3.5, Sodium 143, GFR 13 03/01/2021 Creatinine 4.59, BUN 52, Potassium 3.7, Sodium 143, GFR 12 12/27/2020 Creatinine 4.43, BUN 49, Potassium 3.4, Sodium 140, GFR 13 10/26/2020 Creatinine 4.39, BUN 57, Potassium 3.5, Sodium 146, GFR 13 A complete set of results can be found in Results Review.     Recommendations:   Left voice mail with ICM number and encouraged to call if experiencing any fluid symptoms.   Follow-up plan: ICM clinic phone appointment on 06/26/2022.   91 day device clinic remote transmission 05/29/2022.      EP/Cardiology Office Visits:  06/13/2022 with HF clinic PA/NP.  Recall 01/29/2023 with Tommye Standard, PA.   Copy of ICM check sent to Dr. Lovena Le.  3 month ICM trend: 05/22/2022.    12-14 Month ICM trend:     Rosalene Billings, RN 05/24/2022 9:42 AM

## 2022-05-24 NOTE — Telephone Encounter (Signed)
Remote ICM transmission received.  Attempted call to patient regarding ICM remote transmission and left detailed message per DPR.  Advised to return call for any fluid symptoms or questions. Next ICM remote transmission scheduled 06/26/2022.    

## 2022-05-29 ENCOUNTER — Ambulatory Visit: Payer: Medicare PPO

## 2022-05-30 LAB — CUP PACEART REMOTE DEVICE CHECK
Battery Remaining Longevity: 65 mo
Battery Voltage: 2.99 V
Brady Statistic RV Percent Paced: 0.28 %
Date Time Interrogation Session: 20230814012503
HighPow Impedance: 66 Ohm
Implantable Lead Implant Date: 20170208
Implantable Lead Location: 753860
Implantable Lead Model: 6935
Implantable Pulse Generator Implant Date: 20170208
Lead Channel Impedance Value: 285 Ohm
Lead Channel Impedance Value: 418 Ohm
Lead Channel Pacing Threshold Amplitude: 1 V
Lead Channel Pacing Threshold Pulse Width: 0.4 ms
Lead Channel Sensing Intrinsic Amplitude: 13.375 mV
Lead Channel Sensing Intrinsic Amplitude: 13.375 mV
Lead Channel Setting Pacing Amplitude: 2 V
Lead Channel Setting Pacing Pulse Width: 0.4 ms
Lead Channel Setting Sensing Sensitivity: 0.3 mV

## 2022-06-11 ENCOUNTER — Inpatient Hospital Stay (HOSPITAL_BASED_OUTPATIENT_CLINIC_OR_DEPARTMENT_OTHER)
Admission: EM | Admit: 2022-06-11 | Discharge: 2022-06-13 | DRG: 872 | Disposition: A | Discharge status: Home / Self Care | Payer: Medicare PPO | Attending: Internal Medicine | Admitting: Internal Medicine

## 2022-06-11 ENCOUNTER — Encounter (HOSPITAL_COMMUNITY): Payer: Self-pay

## 2022-06-11 ENCOUNTER — Emergency Department (HOSPITAL_BASED_OUTPATIENT_CLINIC_OR_DEPARTMENT_OTHER): Payer: Medicare PPO

## 2022-06-11 ENCOUNTER — Encounter (HOSPITAL_BASED_OUTPATIENT_CLINIC_OR_DEPARTMENT_OTHER): Payer: Self-pay | Admitting: Emergency Medicine

## 2022-06-11 ENCOUNTER — Other Ambulatory Visit: Payer: Self-pay

## 2022-06-11 DIAGNOSIS — R509 Fever, unspecified: Secondary | ICD-10-CM | POA: Diagnosis not present

## 2022-06-11 DIAGNOSIS — Z95810 Presence of automatic (implantable) cardiac defibrillator: Secondary | ICD-10-CM | POA: Diagnosis not present

## 2022-06-11 DIAGNOSIS — Z79899 Other long term (current) drug therapy: Secondary | ICD-10-CM

## 2022-06-11 DIAGNOSIS — E869 Volume depletion, unspecified: Secondary | ICD-10-CM | POA: Diagnosis present

## 2022-06-11 DIAGNOSIS — R630 Anorexia: Secondary | ICD-10-CM | POA: Insufficient documentation

## 2022-06-11 DIAGNOSIS — Z832 Family history of diseases of the blood and blood-forming organs and certain disorders involving the immune mechanism: Secondary | ICD-10-CM

## 2022-06-11 DIAGNOSIS — D696 Thrombocytopenia, unspecified: Secondary | ICD-10-CM | POA: Diagnosis present

## 2022-06-11 DIAGNOSIS — N184 Chronic kidney disease, stage 4 (severe): Secondary | ICD-10-CM | POA: Diagnosis present

## 2022-06-11 DIAGNOSIS — A419 Sepsis, unspecified organism: Principal | ICD-10-CM

## 2022-06-11 DIAGNOSIS — I10 Essential (primary) hypertension: Secondary | ICD-10-CM | POA: Diagnosis not present

## 2022-06-11 DIAGNOSIS — Z803 Family history of malignant neoplasm of breast: Secondary | ICD-10-CM | POA: Diagnosis not present

## 2022-06-11 DIAGNOSIS — A4152 Sepsis due to Pseudomonas: Principal | ICD-10-CM | POA: Diagnosis present

## 2022-06-11 DIAGNOSIS — I480 Paroxysmal atrial fibrillation: Secondary | ICD-10-CM | POA: Diagnosis not present

## 2022-06-11 DIAGNOSIS — E876 Hypokalemia: Secondary | ICD-10-CM | POA: Diagnosis present

## 2022-06-11 DIAGNOSIS — M1A00X0 Idiopathic chronic gout, unspecified site, without tophus (tophi): Secondary | ICD-10-CM | POA: Diagnosis present

## 2022-06-11 DIAGNOSIS — R6520 Severe sepsis without septic shock: Secondary | ICD-10-CM | POA: Diagnosis not present

## 2022-06-11 DIAGNOSIS — D6959 Other secondary thrombocytopenia: Secondary | ICD-10-CM | POA: Diagnosis present

## 2022-06-11 DIAGNOSIS — I130 Hypertensive heart and chronic kidney disease with heart failure and stage 1 through stage 4 chronic kidney disease, or unspecified chronic kidney disease: Secondary | ICD-10-CM | POA: Diagnosis not present

## 2022-06-11 DIAGNOSIS — K5289 Other specified noninfective gastroenteritis and colitis: Secondary | ICD-10-CM | POA: Diagnosis present

## 2022-06-11 DIAGNOSIS — I5042 Chronic combined systolic (congestive) and diastolic (congestive) heart failure: Secondary | ICD-10-CM | POA: Diagnosis present

## 2022-06-11 DIAGNOSIS — Z8249 Family history of ischemic heart disease and other diseases of the circulatory system: Secondary | ICD-10-CM

## 2022-06-11 DIAGNOSIS — Z833 Family history of diabetes mellitus: Secondary | ICD-10-CM

## 2022-06-11 DIAGNOSIS — G4733 Obstructive sleep apnea (adult) (pediatric): Secondary | ICD-10-CM | POA: Diagnosis present

## 2022-06-11 DIAGNOSIS — Z20822 Contact with and (suspected) exposure to covid-19: Secondary | ICD-10-CM | POA: Diagnosis not present

## 2022-06-11 DIAGNOSIS — N2581 Secondary hyperparathyroidism of renal origin: Secondary | ICD-10-CM | POA: Insufficient documentation

## 2022-06-11 DIAGNOSIS — E778 Other disorders of glycoprotein metabolism: Secondary | ICD-10-CM | POA: Insufficient documentation

## 2022-06-11 DIAGNOSIS — I428 Other cardiomyopathies: Secondary | ICD-10-CM | POA: Diagnosis not present

## 2022-06-11 DIAGNOSIS — M1A9XX0 Chronic gout, unspecified, without tophus (tophi): Secondary | ICD-10-CM | POA: Diagnosis present

## 2022-06-11 DIAGNOSIS — Z789 Other specified health status: Secondary | ICD-10-CM | POA: Insufficient documentation

## 2022-06-11 LAB — COMPREHENSIVE METABOLIC PANEL
ALT: 23 U/L (ref 0–44)
AST: 33 U/L (ref 15–41)
Albumin: 3.4 g/dL — ABNORMAL LOW (ref 3.5–5.0)
Alkaline Phosphatase: 64 U/L (ref 38–126)
Anion gap: 8 (ref 5–15)
BUN: 44 mg/dL — ABNORMAL HIGH (ref 8–23)
CO2: 24 mmol/L (ref 22–32)
Calcium: 8.9 mg/dL (ref 8.9–10.3)
Chloride: 112 mmol/L — ABNORMAL HIGH (ref 98–111)
Creatinine, Ser: 4.46 mg/dL — ABNORMAL HIGH (ref 0.61–1.24)
GFR, Estimated: 13 mL/min — ABNORMAL LOW (ref >60)
Glucose, Bld: 111 mg/dL — ABNORMAL HIGH (ref 70–99)
Potassium: 3.2 mmol/L — ABNORMAL LOW (ref 3.5–5.1)
Sodium: 144 mmol/L (ref 135–145)
Total Bilirubin: 1.7 mg/dL — ABNORMAL HIGH (ref 0.3–1.2)
Total Protein: 6.1 g/dL — ABNORMAL LOW (ref 6.5–8.1)

## 2022-06-11 LAB — APTT: aPTT: 27 seconds (ref 24–36)

## 2022-06-11 LAB — CBC WITH DIFFERENTIAL/PLATELET
Abs Immature Granulocytes: 0.07 10*3/uL (ref 0.00–0.07)
Basophils Absolute: 0 10*3/uL (ref 0.0–0.1)
Basophils Relative: 0 %
Eosinophils Absolute: 0 10*3/uL (ref 0.0–0.5)
Eosinophils Relative: 0 %
HCT: 37.3 % — ABNORMAL LOW (ref 39.0–52.0)
Hemoglobin: 12.8 g/dL — ABNORMAL LOW (ref 13.0–17.0)
Immature Granulocytes: 1 %
Lymphocytes Relative: 2 %
Lymphs Abs: 0.2 10*3/uL — ABNORMAL LOW (ref 0.7–4.0)
MCH: 29.6 pg (ref 26.0–34.0)
MCHC: 34.3 g/dL (ref 30.0–36.0)
MCV: 86.1 fL (ref 80.0–100.0)
Monocytes Absolute: 0.1 10*3/uL (ref 0.1–1.0)
Monocytes Relative: 1 %
Neutro Abs: 8.8 10*3/uL — ABNORMAL HIGH (ref 1.7–7.7)
Neutrophils Relative %: 96 %
Platelets: 104 10*3/uL — ABNORMAL LOW (ref 150–400)
RBC: 4.33 MIL/uL (ref 4.22–5.81)
RDW: 15.1 % (ref 11.5–15.5)
WBC: 9.2 10*3/uL (ref 4.0–10.5)
nRBC: 0 % (ref 0.0–0.2)

## 2022-06-11 LAB — URINALYSIS, ROUTINE W REFLEX MICROSCOPIC
Bilirubin Urine: NEGATIVE
Glucose, UA: NEGATIVE mg/dL
Hgb urine dipstick: NEGATIVE
Ketones, ur: NEGATIVE mg/dL
Leukocytes,Ua: NEGATIVE
Nitrite: NEGATIVE
Protein, ur: NEGATIVE mg/dL
Specific Gravity, Urine: <=1.005 (ref 1.005–1.030)
pH: 5.5 (ref 5.0–8.0)

## 2022-06-11 LAB — LIPASE, BLOOD: Lipase: 29 U/L (ref 11–51)

## 2022-06-11 LAB — LACTIC ACID, PLASMA
Lactic Acid, Venous: 2.6 mmol/L (ref 0.5–1.9)
Lactic Acid, Venous: 4.4 mmol/L (ref 0.5–1.9)
Lactic Acid, Venous: 3.1 mmol/L (ref 0.5–1.9)
Lactic Acid, Venous: 3 mmol/L (ref 0.5–1.9)
Lactic Acid, Venous: 3.2 mmol/L (ref 0.5–1.9)

## 2022-06-11 LAB — RESP PANEL BY RT-PCR (FLU A&B, COVID) ARPGX2
Influenza A by PCR: NEGATIVE
Influenza B by PCR: NEGATIVE
SARS Coronavirus 2 by RT PCR: NEGATIVE

## 2022-06-11 LAB — PROTIME-INR
INR: 1.2 (ref 0.8–1.2)
Prothrombin Time: 14.9 seconds (ref 11.4–15.2)

## 2022-06-11 LAB — SARS CORONAVIRUS 2 BY RT PCR: SARS Coronavirus 2 by RT PCR: NEGATIVE

## 2022-06-11 MED ORDER — SODIUM CHLORIDE 0.9 % IV SOLN
2.0000 g | INTRAVENOUS | Status: DC
  Administered 2022-06-11: 2 g via INTRAVENOUS
  Filled 2022-06-11: qty 20

## 2022-06-11 MED ORDER — FUSION 65-65-25-30 MG PO CAPS: 1.0000 | ORAL_CAPSULE | Freq: Every day | ORAL | Status: DC

## 2022-06-11 MED ORDER — LACTATED RINGERS IV BOLUS (SEPSIS)
250.0000 mL | Freq: Once | INTRAVENOUS | Status: AC
  Administered 2022-06-11: 250 mL via INTRAVENOUS

## 2022-06-11 MED ORDER — LACTATED RINGERS IV SOLN: INTRAVENOUS | Status: AC

## 2022-06-11 MED ORDER — SODIUM CHLORIDE 0.9 % IV SOLN
500.0000 mg | INTRAVENOUS | Status: DC
  Administered 2022-06-11: 500 mg via INTRAVENOUS
  Filled 2022-06-11 (×2): qty 5

## 2022-06-11 MED ORDER — FUROSEMIDE 40 MG PO TABS
40.0000 mg | ORAL_TABLET | Freq: Every day | ORAL | Status: DC
Start: 2022-06-12 — End: 2022-06-11

## 2022-06-11 MED ORDER — AMIODARONE HCL 200 MG PO TABS
100.0000 mg | ORAL_TABLET | Freq: Every day | ORAL | Status: DC
  Administered 2022-06-11 – 2022-06-13 (×3): 100 mg via ORAL
  Filled 2022-06-11 (×3): qty 1

## 2022-06-11 MED ORDER — HYDRALAZINE HCL 25 MG PO TABS
12.5000 mg | ORAL_TABLET | Freq: Three times a day (TID) | ORAL | Status: DC
  Administered 2022-06-11 (×2): 12.5 mg via ORAL
  Filled 2022-06-11 (×2): qty 1

## 2022-06-11 MED ORDER — ONDANSETRON HCL 4 MG PO TABS: 4.0000 mg | ORAL_TABLET | Freq: Four times a day (QID) | ORAL | Status: DC | PRN

## 2022-06-11 MED ORDER — ISOSORBIDE MONONITRATE ER 30 MG PO TB24
30.0000 mg | ORAL_TABLET | Freq: Every day | ORAL | Status: DC
  Administered 2022-06-11: 30 mg via ORAL
  Filled 2022-06-11 (×2): qty 1

## 2022-06-11 MED ORDER — POTASSIUM CHLORIDE CRYS ER 20 MEQ PO TBCR
20.0000 meq | EXTENDED_RELEASE_TABLET | Freq: Once | ORAL | Status: AC
  Administered 2022-06-11: 20 meq via ORAL
  Filled 2022-06-11: qty 1

## 2022-06-11 MED ORDER — FERROUS SULFATE 325 (65 FE) MG PO TABS
325.0000 mg | ORAL_TABLET | Freq: Every day | ORAL | Status: DC
  Administered 2022-06-12 – 2022-06-13 (×2): 325 mg via ORAL
  Filled 2022-06-11 (×2): qty 1

## 2022-06-11 MED ORDER — CARVEDILOL 6.25 MG PO TABS
9.3750 mg | ORAL_TABLET | Freq: Two times a day (BID) | ORAL | Status: DC
  Administered 2022-06-11 – 2022-06-12 (×2): 9.375 mg via ORAL
  Filled 2022-06-11 (×2): qty 1

## 2022-06-11 MED ORDER — LACTATED RINGERS IV BOLUS (SEPSIS)
1000.0000 mL | Freq: Once | INTRAVENOUS | Status: AC
  Administered 2022-06-11: 1000 mL via INTRAVENOUS

## 2022-06-11 MED ORDER — ACETAMINOPHEN 325 MG PO TABS: 650.0000 mg | ORAL_TABLET | Freq: Four times a day (QID) | ORAL | Status: DC | PRN

## 2022-06-11 MED ORDER — HEPARIN SODIUM (PORCINE) 5000 UNIT/ML IJ SOLN
5000.0000 [IU] | Freq: Three times a day (TID) | INTRAMUSCULAR | Status: DC
  Administered 2022-06-11 – 2022-06-12 (×2): 5000 [IU] via SUBCUTANEOUS
  Filled 2022-06-11 (×2): qty 1

## 2022-06-11 MED ORDER — FUROSEMIDE 40 MG PO TABS: 40.0000 mg | ORAL_TABLET | ORAL | Status: DC

## 2022-06-11 MED ORDER — FUROSEMIDE 40 MG PO TABS
80.0000 mg | ORAL_TABLET | Freq: Every day | ORAL | Status: DC
Start: 2022-06-12 — End: 2022-06-11

## 2022-06-11 MED ORDER — ACETAMINOPHEN 650 MG RE SUPP: 650.0000 mg | Freq: Four times a day (QID) | RECTAL | Status: DC | PRN

## 2022-06-11 MED ORDER — ONDANSETRON HCL 4 MG/2ML IJ SOLN: 4.0000 mg | Freq: Four times a day (QID) | INTRAMUSCULAR | Status: DC | PRN

## 2022-06-11 MED ORDER — SODIUM CHLORIDE 0.9 % IV SOLN: INTRAVENOUS | Status: DC | PRN

## 2022-06-11 MED ORDER — ALLOPURINOL 100 MG PO TABS
100.0000 mg | ORAL_TABLET | Freq: Every day | ORAL | Status: DC
  Administered 2022-06-11 – 2022-06-13 (×3): 100 mg via ORAL
  Filled 2022-06-11 (×3): qty 1

## 2022-06-11 NOTE — ED Triage Notes (Signed)
Pt reports n/v since last night accompanied by fever. Pt hypotensive in triage.

## 2022-06-11 NOTE — ED Notes (Signed)
ED Provider at bedside. 

## 2022-06-11 NOTE — ED Notes (Signed)
[facility] at bedside 

## 2022-06-11 NOTE — Progress Notes (Signed)
[facility] following for sepsis protocol. 

## 2022-06-11 NOTE — H&P (Signed)
History and Physical    Patient: Miguel Roach YSA:[id number] DOB: 02-15-1941 DOA: 06/11/2022 DOS: the patient was seen and examined on 06/11/2022 PCP: Deland Pretty, MD  Patient coming from: Home  Chief Complaint:  Chief Complaint  Patient presents with   Fatigue   HPI: Miguel Roach is a 81 y.o. male with medical history significant of colon polyps, anal fistula, osteoarthritis, nonischemic cardiomyopathy, AICD placement, chronic combined systolic and diastolic heart failure, stage IIIb CKD, ED, diverticulosis, gout, headache, hypertension, hemorrhoids, liver lesion, sleep apnea not on CPAP who is coming to the emergency department due to fatigue, fever, chills, rigors since early morning.  Yesterday evening, he ate some crab cakes that he brought from a restaurant Friday.  He went to bed around midnight, but was having some mild abdominal discomfort.  He finally went to sleep around 0300, but then woke up with above symptoms.  He denied rhinorrhea, sore throat, wheezing or hemoptysis.  No chest pain, palpitations, diaphoresis, PND, orthopnea or pitting edema of the lower extremities.  His appetite has not been great, no abdominal pain, nausea, emesis, diarrhea, constipation, melena or hematochezia.  No flank pain, dysuria, frequency or hematuria.  No polyuria, polydipsia, polyphagia or blurred vision.   ED course: Initial vital signs were temperature 99.6 F, pulse 93, respiration 15, BP 72/50 mmHg O2 sat 93% on room air.  The patient received 2250 mL of LR bolus, ceftriaxone and azithromycin.  Lab work: Urinalysis was normal.  CBC showed a white count 8.2, hemoglobin 12.8 g/dL platelets 104.  Normal PT/INR/PTT.  Coronavirus PCR was negative.  Lipase was normal.  Lactic acid 2.6 then 4.4 then 3.1 mmol/L.  CMP showed a sodium of 144, potassium of 3.2, chloride 112 and CO2 24 mmol/L with a normal anion gap.  BUN was 44, creatinine 4.46, glucose 111 and total bilirubin 1.7 mg/dL.  Total protein 6.1  and albumin 3.4 g/dL.  Normal calcium, transaminases and alkaline phosphatase.  Imaging: Portable 1 view chest radiograph with minimal bibasilar subsegmental atelectasis or scaring.   Review of Systems: As mentioned in the history of present illness. All other systems reviewed and are negative.  Past Medical History:  Diagnosis Date   Adenomatous colon polyp 2011   AICD (automatic cardioverter/defibrillator) present    Anal fistula 2003   Arthritis    CHF (congestive heart failure) (HCC)    CRI (chronic renal insufficiency)    creatinine back to normal   Diverticulosis    ED (erectile dysfunction)    Gout    Headache    Hypertension    Internal hemorrhoids    Liver lesion 2007   Nonischemic cardiomyopathy ([place])    EF 20-25% by echo 2016, At that time 40% RCA stenosis, distal 35% circumflex stenosis   Renal cyst 2007   "I'm not familiar with that, but I follow up on my creatinine because my kidney function is low." 09/2016   Sleep apnea    "had test; never followed thru w/getting mask" (11/25/2015)   Past Surgical History:  Procedure Laterality Date   ANAL FISTULOTOMY  01/2002   CARDIAC CATHETERIZATION N/A 04/16/2015   Procedure: Right/Left Heart Cath and Coronary Angiography;  Surgeon: Belva Crome, MD;  Location: [facility];  Service: Cardiovascular;  Laterality: N/A;   EP IMPLANTABLE DEVICE N/A 11/24/2015   Procedure: ICD Implant;  Surgeon: Evans Lance, MD;  Location: [facility];  Service: Cardiovascular;  Laterality: N/A;   ESOPHAGOGASTRODUODENOSCOPY (EGD) WITH PROPOFOL  N/A 10/02/2016   Procedure: ESOPHAGOGASTRODUODENOSCOPY (EGD) WITH PROPOFOL;  Surgeon: Ladene Artist, MD;  Location: [facility];  Service: Endoscopy;  Laterality: N/A;   EUS N/A 10/19/2016   Procedure: UPPER ENDOSCOPIC ULTRASOUND (EUS) RADIAL;  Surgeon: Milus Banister, MD;  Location: [facility];  Service: Endoscopy;  Laterality: N/A;   INSERTION OF ICD  11/24/2015   TONSILLECTOMY  ~ 1950    Social History:  reports that he has never smoked. He has never used smokeless tobacco. He reports that he does not drink alcohol and does not use drugs.  Allergies  Allergen Reactions   Benicar [Olmesartan] Cough    Family History  Problem Relation Age of Onset   Heart failure Mother 76   Sickle cell anemia Brother    Breast cancer Sister    Diabetes Sister    Diabetes Father        died at age 47   Diabetes Sister    Colon polyps Brother    Clotting disorder Other        two daughters (inherited from his first wife)   Colon cancer Neg Hx     Prior to Admission medications   Medication Sig Start Date End Date Taking? Authorizing Provider  allopurinol (ZYLOPRIM) 100 MG tablet Take 100 mg by mouth daily.    [provider]  amiodarone (PACERONE) 100 MG tablet TAKE 1 TABLET BY MOUTH EVERY DAY 04/26/22   Larey Dresser, MD  carvedilol (COREG) 6.25 MG tablet Take 1.5 tablets (9.375 mg total) by mouth 2 (two) times daily with a meal. 02/13/22   Larey Dresser, MD  colchicine 0.6 MG tablet Take 0.6 mg by mouth daily as needed (gout flare up).    [provider]  FERROUS SULFATE PO Take 65 mg by mouth daily.    [provider]  furosemide (LASIX) 40 MG tablet TAKE 2 TABS BY MOUTH EVERY MORNING AND 1 TAB EVERY EVENING ALTERNATING WITH 2 TABS TWICE A DAY. 04/17/22   Larey Dresser, MD  hydrALAZINE (APRESOLINE) 25 MG tablet TAKE 1/2 TABLET BY MOUTH EVERY 8 (EIGHT) HOURS 12/21/21   Larey Dresser, MD  isosorbide mononitrate (IMDUR) 30 MG 24 hr tablet TAKE 1 TABLET BY MOUTH EVERY DAY 12/21/21   Larey Dresser, MD    Physical Exam: Vitals:   06/11/22 1245 06/11/22 1300 06/11/22 1311 06/11/22 1429  BP: (!) 103/59   107/70  Pulse: 73 76  77  Resp: (!) '21 18  14  '$ Temp:   98.2 F (36.8 C) 98.1 F (36.7 C)  TempSrc:   Oral Oral  SpO2: 99% 97%  100%  Weight:    (P) 73 kg  Height:       Physical Exam Vitals and nursing note reviewed.  Constitutional:       General: He is awake. He is not in acute distress.    Appearance: Normal appearance.  HENT:     Head: Normocephalic.     Nose: No rhinorrhea.     Mouth/Throat:     Mouth: Mucous membranes are moist.  Eyes:     General: No scleral icterus.    Pupils: Pupils are equal, round, and reactive to light.  Neck:     Vascular: No JVD.  Cardiovascular:     Rate and Rhythm: Normal rate and regular rhythm.  Pulmonary:     Effort: Pulmonary effort is normal.     Breath sounds: Normal breath sounds. No wheezing, rhonchi or rales.  Abdominal:     General: Bowel sounds are normal.     Palpations: Abdomen is soft.  Musculoskeletal:     Cervical back: Neck supple.     Right lower leg: No edema.     Left lower leg: No edema.  Skin:    General: Skin is warm and dry.     Comments: Decreased skin turgor.  Neurological:     General: No focal deficit present.     Mental Status: He is alert.  Psychiatric:        Mood and Affect: Mood normal.        Behavior: Behavior is cooperative.    Data Reviewed:  There are no new results to review at this time.  February 2022 echocardiogram  IMPRESSIONS     1. Global hypokinesis with septal-lateral dyssynchrony. Left ventricular  ejection fraction, by estimation, is 25 to 30%. The left ventricle has  severely decreased function. The left ventricle demonstrates global  hypokinesis. The left ventricular  internal cavity size was severely dilated. There is mild concentric left  ventricular hypertrophy. Left ventricular diastolic parameters are  consistent with Grade I diastolic dysfunction (impaired relaxation).  Elevated left ventricular end-diastolic  pressure.   2. Right ventricular systolic function is low normal. The right  ventricular size is normal.   3. Left atrial size was mildly dilated.   4. Cannot rule out PFO with right to left flow by color flow Doppler.   5. The mitral valve is normal in structure. Trivial mitral valve   regurgitation. No evidence of mitral stenosis.   6. The aortic valve is tricuspid. Aortic valve regurgitation is mild to  moderate. No aortic stenosis is present. Aortic regurgitation PHT measures  571 msec.   7. Aortic dilatation noted. There is mild dilatation of the ascending  aorta, measuring 37 mm.   8. The inferior vena cava is normal in size with greater than 50%  respiratory variability, suggesting right atrial pressure of 3 mmHg.    Assessment and Plan: Principal Problem:   Sepsis due to undetermined organism ([place]) Questionable pneumonia. Admit to PCU/inpatient. As needed supplemental oxygen. As needed bronchodilators. Continue ceftriaxone 1 g IVPB daily. Continue azithromycin 500 mg IVPB daily. Check strep pneumoniae urinary antigen. Check sputum Gram stain, culture and sensitivity. Follow-up blood culture and sensitivity. Follow-up CBC and chemistry in the morning. Follow lactic acid level in the morning.  Active Problems:   Chronic gouty arthritis Continue allopurinol 100 mg p.o. daily.    Essential hypertension Hold furosemide. Continue carvedilol twice daily. Continue hydralazine 3 times daily. Monitor BP, HR, renal function electrolytes.    Obstructive sleep apnea syndrome Not on CPAP.    Paroxysmal atrial fibrillation (HCC) Not on anticoagulation.   Continue amiodarone 100 mg p.o. daily.    Thrombocytopenia (HCC) Monitor platelet count.    Chronic combined systolic and diastolic congestive heart failure (HCC) No signs of decompensation. Patient seems volume depleted holding furosemide. Check echocardiogram in the morning.    Hypokalemia Supplement given. Follow potassium level in the morning.    Advance Care Planning:   Code Status: Full Code   Consults:   Family Communication: His spouse was with him in his room.  Severity of Illness: The appropriate patient status for this patient is INPATIENT. Inpatient status is judged to be  reasonable and necessary in order to provide the required intensity of service to ensure the patient's safety. The patient's presenting symptoms, physical exam findings, and initial radiographic and laboratory data  in the context of their chronic comorbidities is felt to place them at high risk for further clinical deterioration. Furthermore, it is not anticipated that the patient will be medically stable for discharge from the hospital within 2 midnights of admission.   * I certify that at the point of admission it is my clinical judgment that the patient will require inpatient hospital care spanning beyond 2 midnights from the point of admission due to high intensity of service, high risk for further deterioration and high frequency of surveillance required.*  Author: Reubin Milan, MD 06/11/2022 3:29 PM  For on call review [email or web address].   This document was prepared using Dragon voice recognition software and may contain some unintended transcription errors.

## 2022-06-11 NOTE — ED Notes (Signed)
1 set blood cultures collected with LAC IV start 

## 2022-06-11 NOTE — Progress Notes (Addendum)
Plan of Care Note for accepted transfer   Patient: Miguel Roach MRN: [id number]   DOA: 06/11/2022  Facility requesting transfer: [facility].  Requesting Provider: Deno Etienne, DO. Reason for transfer: Sepsis due to undetermined organism. Facility course:  Per Dr. Tyrone Nine:  " Chief Complaint  Patient presents with   Fatigue      Miguel Roach is a 81 y.o. male.   81 yo M with a chief complaints of feeling fatigued.  The patient yesterday had developed a little bit of abdominal discomfort.  He has trouble describing this.  He took some Alka-Seltzer with improvement.  Denies any abdominal discomfort today.  This morning he has had a little bit of a cough which is not abnormal for him in the morning.  His wife feels like he has been coughing more this morning than he has.  He denies rash denies urinary symptoms.  Had fevers and chills this morning.  Febrile at home and given Tylenol."  He was hypotensive with systolic in the 44R but improved with fluids.  SBP is now in the 100s.  He was started on ceftriaxone and azithromycin.  Resp Panel by RT-PCR (Flu A&B, Covid) Anterior Nasal Swab [[id number]]   Collected: 06/11/22 1001   Updated: 06/11/22 1128   Specimen Source: Anterior Nasal Swab    SARS Coronavirus 2 by RT PCR NEGATIVE   Influenza A by PCR NEGATIVE   Influenza B by PCR NEGATIVE  Lactic acid, plasma [[id number]] (Abnormal)   Collected: 06/11/22 1005   Updated: 06/11/22 1054   Specimen Type: Blood   Specimen Source: Vein    Lactic Acid, Venous 2.6 High Panic   mmol/L  Comprehensive metabolic panel [[id number]] (Abnormal)   Collected: 06/11/22 1005   Updated: 06/11/22 1047   Specimen Type: Blood   Specimen Source: Vein    Sodium 144 mmol/L   Potassium 3.2 Low  mmol/L   Chloride 112 High  mmol/L   CO2 24 mmol/L   Glucose, Bld 111 High  mg/dL   BUN 44 High  mg/dL   Creatinine, Ser 4.46 High  mg/dL   Calcium 8.9 mg/dL   Total Protein 6.1 Low  g/dL   Albumin 3.4  Low  g/dL   AST 33 U/L   ALT 23 U/L   Alkaline Phosphatase 64 U/L   Total Bilirubin 1.7 High  mg/dL   GFR, Estimated 13 Low  mL/min   Anion gap 8  Lipase, blood [[id number]]   Collected: 06/11/22 1005   Updated: 06/11/22 1047   Specimen Type: Blood   Specimen Source: Vein    Lipase 29 U/L  SARS Coronavirus 2 by RT PCR (hospital order, performed in [facility] hospital lab) *cepheid single result test* Anterior Nasal Swab [[id number]]   Collected: 06/11/22 1001   Updated: 06/11/22 1045   Specimen Source: Anterior Nasal Swab    SARS Coronavirus 2 by RT PCR NEGATIVE  Blood Culture (routine x 2) [[id number]]   Collected: 06/11/22 1005   Updated: 06/11/22 1039   Specimen Type: Blood   Specimen Source: Peripheral   Protime-INR [[id number]]   Collected: 06/11/22 1005   Updated: 06/11/22 1038   Specimen Type: Blood   Specimen Source: Vein    Prothrombin Time 14.9 seconds   INR 1.2  APTT [[id number]]   Collected: 06/11/22 1005   Updated: 06/11/22 1038   Specimen Type: Blood   Specimen Source: Vein    aPTT 27 seconds  CBC with Differential [[id number]] (Abnormal)  Collected: 06/11/22 1005   Updated: 06/11/22 1029   Specimen Type: Blood   Specimen Source: Vein    WBC 9.2 K/uL   RBC 4.33 MIL/uL   Hemoglobin 12.8 Low  g/dL   HCT 37.3 Low  %   MCV 86.1 fL   MCH 29.6 pg   MCHC 34.3 g/dL   RDW 15.1 %   Platelets 104 Low  K/uL   nRBC 0.0 %   Neutrophils Relative % 96 %   Neutro Abs 8.8 High  K/uL   Lymphocytes Relative 2 %   Lymphs Abs 0.2 Low  K/uL   Monocytes Relative 1 %   Monocytes Absolute 0.1 K/uL   Eosinophils Relative 0 %   Eosinophils Absolute 0.0 K/uL   Basophils Relative 0 %   Basophils Absolute 0.0 K/uL   Immature Granulocytes 1 %   Abs Immature Granulocytes 0.07 K/uL  Blood Culture (routine x 2) [[id number]]   Collected: 06/11/22 1023   Updated: 06/11/22 1028   Specimen Type: Blood   Specimen Source: Peripheral    Imaging:  CLINICAL DATA:  Sepsis,  fever. EXAM: PORTABLE CHEST 1 VIEW   COMPARISON:  December 27, 2020.   FINDINGS: Stable cardiomediastinal silhouette. Stable left-sided defibrillator is noted. Minimal bibasilar subsegmental atelectasis or scarring is noted. Bony thorax is unremarkable.   IMPRESSION: Minimal bibasilar subsegmental atelectasis or scarring.   Electronically Signed   By: Marijo Conception M.D.   On: 06/11/2022 10:29    Plan of care: The patient is accepted for admission to Progressive unit, at [facility].  Author: Reubin Milan, MD 06/11/2022  Check [email or web address] for on-call coverage.  Nursing staff, Please call [facility] number on Amion as soon as patient's arrival, so appropriate admitting provider can evaluate the pt.  Addendum: The patient's lactic acid rose to 4.4 mmol/L. He was reevaluated by Dr. Tyrone Nine.  MVP maintaining over 65 mmHg with good capillary refills.  The patient is nodding any acute distress and does not have any complaints at the moment.

## 2022-06-11 NOTE — ED Provider Notes (Signed)
[facility] DEPARTMENT Provider Note   CSN: [id number] Arrival date & time: 06/11/22  8182     History  Chief Complaint  Patient presents with   Fatigue    Miguel Roach is a 81 y.o. male.  81 yo M with a chief complaints of feeling fatigued.  The patient yesterday had developed a little bit of abdominal discomfort.  He has trouble describing this.  He took some Alka-Seltzer with improvement.  Denies any abdominal discomfort today.  This morning he has had a little bit of a cough which is not abnormal for him in the morning.  His wife feels like he has been coughing more this morning than he has.  He denies rash denies urinary symptoms.  Had fevers and chills this morning.  Febrile at home and given Tylenol.        Home Medications Prior to Admission medications   Medication Sig Start Date End Date Taking? Authorizing Provider  allopurinol (ZYLOPRIM) 100 MG tablet Take 100 mg by mouth daily.    [provider]  amiodarone (PACERONE) 100 MG tablet TAKE 1 TABLET BY MOUTH EVERY DAY 04/26/22   Larey Dresser, MD  carvedilol (COREG) 6.25 MG tablet Take 1.5 tablets (9.375 mg total) by mouth 2 (two) times daily with a meal. 02/13/22   Larey Dresser, MD  colchicine 0.6 MG tablet Take 0.6 mg by mouth daily as needed (gout flare up).    [provider]  FERROUS SULFATE PO Take 65 mg by mouth daily.    [provider]  furosemide (LASIX) 40 MG tablet TAKE 2 TABS BY MOUTH EVERY MORNING AND 1 TAB EVERY EVENING ALTERNATING WITH 2 TABS TWICE A DAY. 04/17/22   Larey Dresser, MD  hydrALAZINE (APRESOLINE) 25 MG tablet TAKE 1/2 TABLET BY MOUTH EVERY 8 (EIGHT) HOURS 12/21/21   Larey Dresser, MD  isosorbide mononitrate (IMDUR) 30 MG 24 hr tablet TAKE 1 TABLET BY MOUTH EVERY DAY 12/21/21   Larey Dresser, MD      Allergies    Benicar [olmesartan]    Review of Systems   Review of Systems  Physical Exam Updated Vital Signs BP (!) 104/57    Pulse 72   Temp 97.9 F (36.6 C)   Resp 19   Ht '5\' 11"'$  (1.803 m)   Wt 73.3 kg   SpO2 100%   BMI 22.55 kg/m  Physical Exam Vitals and nursing note reviewed.  Constitutional:      Appearance: He is well-developed.  HENT:     Head: Normocephalic and atraumatic.  Eyes:     Pupils: Pupils are equal, round, and reactive to light.  Neck:     Vascular: No JVD.  Cardiovascular:     Rate and Rhythm: Normal rate and regular rhythm.     Heart sounds: No murmur heard.    No friction rub. No gallop.  Pulmonary:     Effort: No respiratory distress.     Breath sounds: No wheezing.  Abdominal:     General: There is no distension.     Tenderness: There is no abdominal tenderness. There is no guarding or rebound.  Musculoskeletal:        General: Normal range of motion.     Cervical back: Normal range of motion and neck supple.  Skin:    Coloration: Skin is not pale.     Findings: No rash.  Neurological:     Mental Status: He is alert  and oriented to person, place, and time.  Psychiatric:        Behavior: Behavior normal.     ED Results / Procedures / Treatments   Labs (all labs ordered are listed, but only abnormal results are displayed) Labs Reviewed  LACTIC ACID, PLASMA - Abnormal; Notable for the following components:      Result Value   Lactic Acid, Venous 2.6 (*)    All other components within normal limits  COMPREHENSIVE METABOLIC PANEL - Abnormal; Notable for the following components:   Potassium 3.2 (*)    Chloride 112 (*)    Glucose, Bld 111 (*)    BUN 44 (*)    Creatinine, Ser 4.46 (*)    Total Protein 6.1 (*)    Albumin 3.4 (*)    Total Bilirubin 1.7 (*)    GFR, Estimated 13 (*)    All other components within normal limits  CBC WITH DIFFERENTIAL/PLATELET - Abnormal; Notable for the following components:   Hemoglobin 12.8 (*)    HCT 37.3 (*)    Platelets 104 (*)    Neutro Abs 8.8 (*)    Lymphs Abs 0.2 (*)    All other components within normal limits  SARS  CORONAVIRUS 2 BY RT PCR  RESP PANEL BY RT-PCR (FLU A&B, COVID) [id number]  CULTURE, BLOOD (ROUTINE X 2)  CULTURE, BLOOD (ROUTINE X 2)  URINE CULTURE  PROTIME-INR  APTT  LIPASE, BLOOD  LACTIC ACID, PLASMA  URINALYSIS, ROUTINE W REFLEX MICROSCOPIC    EKG EKG Interpretation  Date/Time:  Sunday June 11 2022 10:10:28 EDT Ventricular Rate:  84 PR Interval:  213 QRS Duration: 144 QT Interval:  425 QTC Calculation: 503 R Axis:   -81 Text Interpretation: Sinus rhythm Borderline prolonged PR interval Nonspecific IVCD with LAD No significant change since last tracing Confirmed by Deno Etienne [phone]) on 06/11/2022 10:21:18 AM  Radiology DG Chest Port 1 View  Result Date: 06/11/2022 CLINICAL DATA:  Sepsis, fever. EXAM: PORTABLE CHEST 1 VIEW COMPARISON:  December 27, 2020. FINDINGS: Stable cardiomediastinal silhouette. Stable left-sided defibrillator is noted. Minimal bibasilar subsegmental atelectasis or scarring is noted. Bony thorax is unremarkable. IMPRESSION: Minimal bibasilar subsegmental atelectasis or scarring. Electronically Signed   By: Marijo Conception M.D.   On: 06/11/2022 10:29    Procedures Procedures    Medications Ordered in ED Medications  lactated ringers infusion ( Intravenous New Bag/Given 06/11/22 1135)  cefTRIAXone (ROCEPHIN) 2 g in sodium chloride 0.9 % 100 mL IVPB (0 g Intravenous Stopped 06/11/22 1159)  azithromycin (ZITHROMAX) 500 mg in sodium chloride 0.9 % 250 mL IVPB (has no administration in time range)  0.9 %  sodium chloride infusion (0 mLs Intravenous Stopped 06/11/22 1159)  lactated ringers bolus 1,000 mL (0 mLs Intravenous Stopped 06/11/22 1102)    And  lactated ringers bolus 1,000 mL (0 mLs Intravenous Stopped 06/11/22 1133)    And  lactated ringers bolus 250 mL (0 mLs Intravenous Stopped 06/11/22 1159)    ED Course/ Medical Decision Making/ A&P                           Medical Decision Making Amount and/or Complexity of Data Reviewed Labs:  ordered. Radiology: ordered. ECG/medicine tests: ordered.  Risk Prescription drug management.   81 yo M with a chief complaints of a fever.  Having chills and with sounds like rigors this morning.  He is hypotensive upon arrival here.  Code sepsis activated.  We will give community-acquired pneumonia antibiotics based on increased cough this morning.  Chest x-ray blood work UA IV fluids reassess.  Patient has a mildly elevated lactate level, chest x-ray with possible atelectasis though in this case I would consider possible pneumonia.  Awaiting UA.  Renal function appears to be at baseline.  Patient's blood pressure trended upwards.  We will discuss with medicine for admission.  CRITICAL CARE Performed by: Cecilio Asper   Total critical care time: 35 minutes  Critical care time was exclusive of separately billable procedures and treating other patients.  Critical care was necessary to treat or prevent imminent or life-threatening deterioration.  Critical care was time spent personally by me on the following activities: development of treatment plan with patient and/or surrogate as well as nursing, discussions with consultants, evaluation of patient's response to treatment, examination of patient, obtaining history from patient or surrogate, ordering and performing treatments and interventions, ordering and review of laboratory studies, ordering and review of radiographic studies, pulse oximetry and re-evaluation of patient's condition.   Sepsis - Repeat Assessment  Performed at:    12:03 PM   Vitals     Blood pressure (!) 104/57, pulse 72, temperature 97.9 F (36.6 C), resp. rate 19, height '5\' 11"'$  (1.803 m), weight 73.3 kg, SpO2 100 %.  Heart:     Regular rate and rhythm  Lungs:    CTA  Capillary Refill:   <2 sec  Peripheral Pulse:   Radial pulse palpable  Skin:     Normal Color and Dry   The patients results and plan were reviewed and discussed.   Any x-rays  performed were independently reviewed by myself.   Differential diagnosis were considered with the presenting HPI.  Medications  lactated ringers infusion ( Intravenous New Bag/Given 06/11/22 1135)  cefTRIAXone (ROCEPHIN) 2 g in sodium chloride 0.9 % 100 mL IVPB (0 g Intravenous Stopped 06/11/22 1159)  azithromycin (ZITHROMAX) 500 mg in sodium chloride 0.9 % 250 mL IVPB (has no administration in time range)  0.9 %  sodium chloride infusion (0 mLs Intravenous Stopped 06/11/22 1159)  lactated ringers bolus 1,000 mL (0 mLs Intravenous Stopped 06/11/22 1102)    And  lactated ringers bolus 1,000 mL (0 mLs Intravenous Stopped 06/11/22 1133)    And  lactated ringers bolus 250 mL (0 mLs Intravenous Stopped 06/11/22 1159)    Vitals:   06/11/22 1045 06/11/22 1100 06/11/22 1115 06/11/22 1130  BP: (!) 83/47 91/65 (!) 92/56 (!) 104/57  Pulse: 76 72 72 72  Resp: '18 20 17 19  '$ Temp:      TempSrc:      SpO2: 98% 97% 100% 100%  Weight:      Height:        Final diagnoses:  Sepsis, due to unspecified organism, unspecified whether acute organ dysfunction present [facility])    Admission/ observation were discussed with the admitting physician, patient and/or family and they are comfortable with the plan.          Final Clinical Impression(s) / ED Diagnoses Final diagnoses:  Sepsis, due to unspecified organism, unspecified whether acute organ dysfunction present [facility])    Rx / DC Orders ED Discharge Orders     None         Deno Etienne, DO 06/11/22 1203

## 2022-06-11 NOTE — ED Notes (Signed)
Second set blood cultures collected with second IV placement

## 2022-06-11 NOTE — ED Triage Notes (Signed)
Pt arrives pov, c/o n/v with fever and chills last night, fatigue today,

## 2022-06-11 NOTE — Plan of Care (Signed)

## 2022-06-12 ENCOUNTER — Other Ambulatory Visit (HOSPITAL_COMMUNITY): Payer: Medicare PPO

## 2022-06-12 ENCOUNTER — Inpatient Hospital Stay (HOSPITAL_COMMUNITY): Payer: Medicare PPO

## 2022-06-12 DIAGNOSIS — I5042 Chronic combined systolic (congestive) and diastolic (congestive) heart failure: Secondary | ICD-10-CM

## 2022-06-12 DIAGNOSIS — A419 Sepsis, unspecified organism: Secondary | ICD-10-CM | POA: Diagnosis not present

## 2022-06-12 LAB — COMPREHENSIVE METABOLIC PANEL
ALT: 30 U/L (ref 0–44)
AST: 25 U/L (ref 15–41)
Albumin: 2.5 g/dL — ABNORMAL LOW (ref 3.5–5.0)
Alkaline Phosphatase: 45 U/L (ref 38–126)
Anion gap: 7 (ref 5–15)
BUN: 47 mg/dL — ABNORMAL HIGH (ref 8–23)
CO2: 25 mmol/L (ref 22–32)
Calcium: 8.5 mg/dL — ABNORMAL LOW (ref 8.9–10.3)
Chloride: 112 mmol/L — ABNORMAL HIGH (ref 98–111)
Creatinine, Ser: 3.98 mg/dL — ABNORMAL HIGH (ref 0.61–1.24)
GFR, Estimated: 15 mL/min — ABNORMAL LOW (ref >60)
Glucose, Bld: 82 mg/dL (ref 70–99)
Potassium: 4.1 mmol/L (ref 3.5–5.1)
Sodium: 144 mmol/L (ref 135–145)
Total Bilirubin: 0.8 mg/dL (ref 0.3–1.2)
Total Protein: 4.7 g/dL — ABNORMAL LOW (ref 6.5–8.1)

## 2022-06-12 LAB — BLOOD CULTURE ID PANEL (REFLEXED) - BCID2
A.calcoaceticus-baumannii: NOT DETECTED
Bacteroides fragilis: NOT DETECTED
CTX-M ESBL: NOT DETECTED
Candida albicans: NOT DETECTED
Candida auris: NOT DETECTED
Candida glabrata: NOT DETECTED
Candida krusei: NOT DETECTED
Candida parapsilosis: NOT DETECTED
Candida tropicalis: NOT DETECTED
Carbapenem resist OXA 48 LIKE: NOT DETECTED
Carbapenem resistance IMP: NOT DETECTED
Carbapenem resistance KPC: NOT DETECTED
Carbapenem resistance NDM: NOT DETECTED
Carbapenem resistance VIM: NOT DETECTED
Cryptococcus neoformans/gattii: NOT DETECTED
Enterobacter cloacae complex: NOT DETECTED
Enterobacterales: DETECTED — AB
Enterococcus Faecium: NOT DETECTED
Enterococcus faecalis: NOT DETECTED
Escherichia coli: DETECTED — AB
Haemophilus influenzae: NOT DETECTED
Klebsiella aerogenes: NOT DETECTED
Klebsiella oxytoca: NOT DETECTED
Klebsiella pneumoniae: NOT DETECTED
Listeria monocytogenes: NOT DETECTED
Neisseria meningitidis: NOT DETECTED
Proteus species: NOT DETECTED
Pseudomonas aeruginosa: DETECTED — AB
Salmonella species: NOT DETECTED
Serratia marcescens: NOT DETECTED
Staphylococcus aureus (BCID): NOT DETECTED
Staphylococcus epidermidis: NOT DETECTED
Staphylococcus lugdunensis: NOT DETECTED
Staphylococcus species: NOT DETECTED
Stenotrophomonas maltophilia: NOT DETECTED
Streptococcus agalactiae: NOT DETECTED
Streptococcus pneumoniae: NOT DETECTED
Streptococcus pyogenes: NOT DETECTED
Streptococcus species: NOT DETECTED

## 2022-06-12 LAB — ECHOCARDIOGRAM COMPLETE
Area-P 1/2: 3.77 cm2
Calc EF: 30.7 %
Height: 71 in
P 1/2 time: 339 msec
S' Lateral: 6 cm
Single Plane A2C EF: 33.2 %
Single Plane A4C EF: 27.6 %
Weight: 2574.97 oz

## 2022-06-12 LAB — EXPECTORATED SPUTUM ASSESSMENT W GRAM STAIN, RFLX TO RESP C

## 2022-06-12 LAB — CBC
HCT: 29.1 % — ABNORMAL LOW (ref 39.0–52.0)
Hemoglobin: 10.1 g/dL — ABNORMAL LOW (ref 13.0–17.0)
MCH: 30.2 pg (ref 26.0–34.0)
MCHC: 34.7 g/dL (ref 30.0–36.0)
MCV: 87.1 fL (ref 80.0–100.0)
Platelets: 76 10*3/uL — ABNORMAL LOW (ref 150–400)
RBC: 3.34 MIL/uL — ABNORMAL LOW (ref 4.22–5.81)
RDW: 15.3 % (ref 11.5–15.5)
WBC: 12.1 10*3/uL — ABNORMAL HIGH (ref 4.0–10.5)
nRBC: 0 % (ref 0.0–0.2)

## 2022-06-12 LAB — LACTIC ACID, PLASMA: Lactic Acid, Venous: 1.3 mmol/L (ref 0.5–1.9)

## 2022-06-12 LAB — URINE CULTURE: Culture: NO GROWTH

## 2022-06-12 LAB — STREP PNEUMONIAE URINARY ANTIGEN: Strep Pneumo Urinary Antigen: NEGATIVE

## 2022-06-12 MED ORDER — SODIUM CHLORIDE 0.9 % IV SOLN: INTRAVENOUS | Status: DC

## 2022-06-12 MED ORDER — SODIUM CHLORIDE 0.9 % IV SOLN
2.0000 g | INTRAVENOUS | Status: DC
  Administered 2022-06-12 – 2022-06-13 (×2): 2 g via INTRAVENOUS
  Filled 2022-06-12 (×2): qty 12.5

## 2022-06-12 MED ORDER — PERFLUTREN LIPID MICROSPHERE
1.0000 mL | INTRAVENOUS | Status: AC | PRN
  Administered 2022-06-12: 2 mL via INTRAVENOUS

## 2022-06-12 MED ORDER — CARVEDILOL 3.125 MG PO TABS
3.1250 mg | ORAL_TABLET | Freq: Two times a day (BID) | ORAL | Status: DC
  Administered 2022-06-12 – 2022-06-13 (×2): 3.125 mg via ORAL
  Filled 2022-06-12 (×2): qty 1

## 2022-06-12 NOTE — Progress Notes (Signed)
PROGRESS NOTE  Miguel Roach  DOB: [birth year]  PCP: Deland Pretty, MD CLE:[id number]  DOA: 06/11/2022  LOS: 1 day  Hospital Day: 2  Brief narrative: Miguel Roach is a 81 y.o. male with PMH significant for HTN, chronic combined systolic and diastolic CHF, nonischemic cardiomyopathy, AICD in place, CKD, arthritis Patient presented to the ED with complaint of, fever, chills, fatigue low blood pressure on the morning of presentation.  Patient reports that the previous night he ate some crab cakes that he brought from a restaurant 2 days ago.  He started having abdominal discomfort during the night and woke up with severe symptoms in the morning.   In the ED, patient was afebrile, heart rate in 70s, blood pressure low at 72/50, breathing on room air Labs showed WC count 9.2, hemoglobin 12.8, platelet 104, potassium 3.2, BUN/creatinine 44/4.46, lactic acid elevated 2.6 which trended up to 4.4 later Patient was given more than 2 L of IV fluid bolus. Blood culture was sent He was started on IV Rocephin, IV azithromycin. Chest x-ray showed minimal bibasilar subsegmental atelectasis or scaring.  Subjective: Patient was seen and examined this morning.  Pleasant elderly African-American male.  Lying on bed.  Not in distress.  No new symptoms.  Wife at bedside. Chart reviewed.  Remains afebrile overnight, blood pressure in low 100s, breathing on room air Labs from this morning show WBC count 12.1, hemoglobin 10.1, platelet low at 76, downtrending lactic acid to normal of 1.3 this morning, creatinine improving to 47/3.98.  Assessment and plan: Severe sepsis with hypotension - Unclear source E. coli and Pseudomonas bacteremia Presented with fever, chills, nausea, vomiting, fatigue Initial blood pressure in 70s with lactic acid level elevated over 4.4, creatinine elevated over 4 Initially started on IV Rocephin and azithromycin Preliminary blood culture is showing E. coli and Pseudomonas Discussed  with pharmacy.  We will switch antibiotics to IV cefepime. Monitor temperature and WBC trend Lactic acid normalized as trended below. Blood pressure in low normal range.  Will avoid IV fluid at this time.  Encourage oral hydration. Recent Labs  Lab 06/11/22 1005 06/11/22 1204 06/11/22 1317 06/11/22 1622 06/11/22 1814 06/12/22 0440  WBC 9.2  --   --   --   --  12.1*  LATICACIDVEN 2.6* 4.4* 3.1* 3.0* 3.2* 1.3   Acute gastroenteritis Per history, patient's symptoms started few hours after eating crab cake.  Initial presentation could have just been due to food poisoning but bacteremia is concerning for indicate a clear bacterial source of infection Currently on antiemetics and regular consistency diet.  Hypokalemia Potassium was low at 3.2.  Replaced.  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 and 40 mg p.m., hydralazine 12.5 mg 3 times daily, Imdur 30 mg daily. I would continue Coreg at a lower dose of 3.125 mg twice daily and keep others on hold for now. Pending echo  Paroxysmal atrial fibrillation Continue Coreg and amiodarone 100 mg p.o. daily. Not on anticoagulation.    CKD 4 Creatinine between 4 and 4.5 at baseline.  Remains at baseline.  Continue monitor Recent Labs    07/05/21 1214 02/13/22 1112 06/11/22 1005 06/12/22 0440  BUN 53* 42* 44* 47*  CREATININE 4.30* 4.49* 4.46* 3.98*   Thrombocytopenia Monitor platelet count. Avoid heparin products for now  Recent Labs  Lab 06/11/22 1005 06/12/22 0440  PLT 104* 76*    Chronic gouty arthritis Continue allopurinol 100 mg p.o. daily.    Obstructive sleep apnea syndrome Not on CPAP  Goals of care   Code Status: Full Code   Mobility: Encourage ambulation  Skin assessment:      Nutritional status:  Body mass index is 22.45 kg/m (pended).          Diet:  Diet Order             Diet Heart Room service appropriate? Yes; Fluid consistency: Thin  Diet effective now                   DVT prophylaxis: Avoid heparin products because of thrombocytopenia    Antimicrobials: IV cefepime Fluid: None Consultants: None Family Communication: Wife at bedside  Status is: Inpatient  Continue in-hospital care because: Blood culture report pending.  On IV antibiotics Level of care: Progressive   Dispo: The patient is from: Home              Anticipated d/c is to: Hopefully back to home in next 1 to 2 days              Patient currently is not medically stable to d/c.   Difficult to place patient No     Infusions:   sodium chloride Stopped (06/11/22 1159)   ceFEPime (MAXIPIME) IV 2 g (06/12/22 0910)    Scheduled Meds:  allopurinol  100 mg Oral Daily   amiodarone  100 mg Oral Daily   carvedilol  3.125 mg Oral BID WC   ferrous sulfate  325 mg Oral Q breakfast    PRN meds: sodium chloride, acetaminophen **OR** acetaminophen, ondansetron **OR** ondansetron (ZOFRAN) IV   Antimicrobials: Anti-infectives (From admission, onward)    Start     Dose/Rate Route Frequency Ordered Stop   06/12/22 0800  ceFEPIme (MAXIPIME) 2 g in sodium chloride 0.9 % 100 mL IVPB        2 g 200 mL/hr over 30 Minutes Intravenous Every 24 hours 06/12/22 0711     06/11/22 1030  cefTRIAXone (ROCEPHIN) 2 g in sodium chloride 0.9 % 100 mL IVPB  Status:  Discontinued        2 g 200 mL/hr over 30 Minutes Intravenous Every 24 hours 06/11/22 1018 06/12/22 0711   06/11/22 1030  azithromycin (ZITHROMAX) 500 mg in sodium chloride 0.9 % 250 mL IVPB  Status:  Discontinued        500 mg 250 mL/hr over 60 Minutes Intravenous Every 24 hours 06/11/22 1018 06/12/22 0711       Objective: Vitals:   06/12/22 0808 06/12/22 1210  BP: 102/63 106/65  Pulse: 72 66  Resp: 20 20  Temp:  98.4 F (36.9 C) 98.1 F (36.7 C)  SpO2: 96% 97%    Intake/Output Summary (Last 24 hours) at 06/12/2022 1320 Last data filed at 06/12/2022 1304 Gross per 24 hour  Intake 620 ml  Output 450 ml  Net 170 ml   Filed Weights   06/11/22 1011 06/11/22 1429  Weight: 73.3 kg (P) 73 kg   Weight change:  Body mass index is 22.45 kg/m (pended).   Physical Exam: General exam: Pleasant, elderly African-American male.  Not in physical distress Skin: No rashes, lesions or ulcers. HEENT: Atraumatic, normocephalic, no obvious bleeding Lungs: Clear to auscultation bilaterally CVS: Regular rate and rhythm, no murmur GI/Abd soft, nontender, nondistended, bowel sound present CNS:  Alert, awake, oriented x3 mood appropriate Psychiatry: Mood appropriate, Extremities: No pedal edema, no calf tenderness  Data Review: I have personally reviewed the laboratory data and studies available.  F/u labs ordered Unresulted Labs (From admission, onward)     Start     Ordered   06/13/22 0500  CBC with Differential/Platelet  Tomorrow morning,   R        06/12/22 1320   06/13/22 3748  Basic metabolic panel  Tomorrow morning,   R        06/12/22 1320   06/11/22 1442  Expectorated Sputum Assessment w Gram Stain, Rflx to Resp Cult  (COPD / Pneumonia / Cellulitis / Lower Extremity Wound)  Once,   R        06/11/22 1441   06/11/22 1018  Urine Culture  (Septic presentation on arrival (screening labs, nursing and treatment orders for obvious sepsis))  ONCE - URGENT,   URGENT       Question:  Indication  Answer:  Sepsis   06/11/22 1018            Signed, Terrilee Croak, MD [facility] 06/12/2022

## 2022-06-12 NOTE — Progress Notes (Signed)
PHARMACY - PHYSICIAN COMMUNICATION CRITICAL VALUE ALERT - BLOOD CULTURE IDENTIFICATION (BCID)  Miguel Roach is an 81 y.o. male who presented to [facility] on 06/11/2022 with a chief complaint of fatigue, fever, chills, rigors.   Assessment:  sepsis, questionable pneumonia as source  Name of physician (or Provider) Contacted: Dr. Pietro Cassis  Current antibiotics: Ceftriaxone, Azithromycin  Changes to prescribed antibiotics recommended:  Discontinue Ceftriaxone and Azithromycin Start Cefepime 2g IV q24h Continue to monitor culture results, renal function  Results for orders placed or performed during the hospital encounter of 06/11/22  Blood Culture ID Panel (Reflexed) (Collected: 06/11/2022 10:05 AM)  Result Value Ref Range   Enterococcus faecalis NOT DETECTED NOT DETECTED   Enterococcus Faecium NOT DETECTED NOT DETECTED   Listeria monocytogenes NOT DETECTED NOT DETECTED   Staphylococcus species NOT DETECTED NOT DETECTED   Staphylococcus aureus (BCID) NOT DETECTED NOT DETECTED   Staphylococcus epidermidis NOT DETECTED NOT DETECTED   Staphylococcus lugdunensis NOT DETECTED NOT DETECTED   Streptococcus species NOT DETECTED NOT DETECTED   Streptococcus agalactiae NOT DETECTED NOT DETECTED   Streptococcus pneumoniae NOT DETECTED NOT DETECTED   Streptococcus pyogenes NOT DETECTED NOT DETECTED   A.calcoaceticus-baumannii NOT DETECTED NOT DETECTED   Bacteroides fragilis NOT DETECTED NOT DETECTED   Enterobacterales DETECTED (A) NOT DETECTED   Enterobacter cloacae complex NOT DETECTED NOT DETECTED   Escherichia coli DETECTED (A) NOT DETECTED   Klebsiella aerogenes NOT DETECTED NOT DETECTED   Klebsiella oxytoca NOT DETECTED NOT DETECTED   Klebsiella pneumoniae NOT DETECTED NOT DETECTED   Proteus species NOT DETECTED NOT DETECTED   Salmonella species NOT DETECTED NOT DETECTED   Serratia marcescens NOT DETECTED NOT DETECTED   Haemophilus influenzae NOT DETECTED NOT DETECTED   Neisseria  meningitidis NOT DETECTED NOT DETECTED   Pseudomonas aeruginosa DETECTED (A) NOT DETECTED   Stenotrophomonas maltophilia NOT DETECTED NOT DETECTED   Candida albicans NOT DETECTED NOT DETECTED   Candida auris NOT DETECTED NOT DETECTED   Candida glabrata NOT DETECTED NOT DETECTED   Candida krusei NOT DETECTED NOT DETECTED   Candida parapsilosis NOT DETECTED NOT DETECTED   Candida tropicalis NOT DETECTED NOT DETECTED   Cryptococcus neoformans/gattii NOT DETECTED NOT DETECTED   CTX-M ESBL NOT DETECTED NOT DETECTED   Carbapenem resistance IMP NOT DETECTED NOT DETECTED   Carbapenem resistance KPC NOT DETECTED NOT DETECTED   Carbapenem resistance NDM NOT DETECTED NOT DETECTED   Carbapenem resist OXA 48 LIKE NOT DETECTED NOT DETECTED   Carbapenem resistance VIM NOT DETECTED NOT DETECTED    Luiz Ochoa 06/12/2022  6:58 AM

## 2022-06-12 NOTE — Progress Notes (Signed)
Echocardiogram 2D Echocardiogram has been performed.  Miguel Roach 06/12/2022, 1:48 PM

## 2022-06-12 NOTE — Plan of Care (Signed)
  Problem: Clinical Measurements: Goal: Diagnostic test results will improve Outcome: Adequate for Discharge Goal: Respiratory complications will improve Outcome: Adequate for Discharge Goal: Cardiovascular complication will be avoided Outcome: Adequate for Discharge   Problem: Coping: Goal: Level of anxiety will decrease Outcome: Adequate for Discharge   Problem: Elimination: Goal: Will not experience complications related to urinary retention Outcome: Adequate for Discharge   Problem: Pain Managment: Goal: General experience of comfort will improve Outcome: Adequate for Discharge   Problem: Education: Goal: Knowledge of General Education information will improve Description: Including pain rating scale, medication(s)/side effects and non-pharmacologic comfort measures Outcome: Completed/Met   Problem: Activity: Goal: Risk for activity intolerance will decrease Outcome: Completed/Met   Problem: Nutrition: Goal: Adequate nutrition will be maintained Outcome: Completed/Met   Problem: Safety: Goal: Ability to remain free from injury will improve Outcome: Completed/Met   Problem: Skin Integrity: Goal: Risk for impaired skin integrity will decrease Outcome: Completed/Met

## 2022-06-12 NOTE — Progress Notes (Signed)
Mobility Specialist - Progress Note     06/12/22 0950  Mobility  Activity Ambulated with assistance in hallway  Range of Motion/Exercises Active  Level of Assistance Independent after set-up  Assistive Device None  Distance Ambulated (ft) 500 ft  Activity Response Tolerated well  $Mobility charge 1 Mobility   Pt was in bed and agreeable to mobilize. Pt had no complaints during ambulation and at EOS returned back to bed with family in room. RN was notified of session.  Ferd Hibbs Mobility Specialist

## 2022-06-13 ENCOUNTER — Encounter (HOSPITAL_COMMUNITY): Payer: Medicare PPO

## 2022-06-13 DIAGNOSIS — A419 Sepsis, unspecified organism: Secondary | ICD-10-CM | POA: Diagnosis not present

## 2022-06-13 LAB — CBC WITH DIFFERENTIAL/PLATELET
Abs Immature Granulocytes: 0.22 10*3/uL — ABNORMAL HIGH (ref 0.00–0.07)
Basophils Absolute: 0 10*3/uL (ref 0.0–0.1)
Basophils Relative: 0 %
Eosinophils Absolute: 0.1 10*3/uL (ref 0.0–0.5)
Eosinophils Relative: 1 %
HCT: 29.9 % — ABNORMAL LOW (ref 39.0–52.0)
Hemoglobin: 10.4 g/dL — ABNORMAL LOW (ref 13.0–17.0)
Immature Granulocytes: 2 %
Lymphocytes Relative: 7 %
Lymphs Abs: 0.8 10*3/uL (ref 0.7–4.0)
MCH: 30.3 pg (ref 26.0–34.0)
MCHC: 34.8 g/dL (ref 30.0–36.0)
MCV: 87.2 fL (ref 80.0–100.0)
Monocytes Absolute: 0.8 10*3/uL (ref 0.1–1.0)
Monocytes Relative: 7 %
Neutro Abs: 9 10*3/uL — ABNORMAL HIGH (ref 1.7–7.7)
Neutrophils Relative %: 83 %
Platelets: 78 10*3/uL — ABNORMAL LOW (ref 150–400)
RBC: 3.43 MIL/uL — ABNORMAL LOW (ref 4.22–5.81)
RDW: 15.3 % (ref 11.5–15.5)
WBC: 10.9 10*3/uL — ABNORMAL HIGH (ref 4.0–10.5)
nRBC: 0 % (ref 0.0–0.2)

## 2022-06-13 LAB — BASIC METABOLIC PANEL
Anion gap: 4 — ABNORMAL LOW (ref 5–15)
BUN: 51 mg/dL — ABNORMAL HIGH (ref 8–23)
CO2: 24 mmol/L (ref 22–32)
Calcium: 8.4 mg/dL — ABNORMAL LOW (ref 8.9–10.3)
Chloride: 114 mmol/L — ABNORMAL HIGH (ref 98–111)
Creatinine, Ser: 3.78 mg/dL — ABNORMAL HIGH (ref 0.61–1.24)
GFR, Estimated: 15 mL/min — ABNORMAL LOW (ref >60)
Glucose, Bld: 82 mg/dL (ref 70–99)
Potassium: 3.8 mmol/L (ref 3.5–5.1)
Sodium: 142 mmol/L (ref 135–145)

## 2022-06-13 MED ORDER — CIPROFLOXACIN HCL 500 MG PO TABS: 750.0000 mg | ORAL_TABLET | Freq: Every day | ORAL | Status: DC

## 2022-06-13 MED ORDER — SACCHAROMYCES BOULARDII 250 MG PO CAPS: 250.0000 mg | ORAL_CAPSULE | Freq: Two times a day (BID) | ORAL | 0 refills | Status: AC

## 2022-06-13 MED ORDER — ORAL CARE MOUTH RINSE: 15.0000 mL | OROMUCOSAL | Status: DC | PRN

## 2022-06-13 MED ORDER — CIPROFLOXACIN HCL 750 MG PO TABS: 750.0000 mg | ORAL_TABLET | Freq: Every day | ORAL | 0 refills | Status: AC

## 2022-06-13 MED ORDER — CARVEDILOL 6.25 MG PO TABS: 6.2500 mg | ORAL_TABLET | Freq: Two times a day (BID) | ORAL | 3 refills | Status: DC

## 2022-06-13 NOTE — Discharge Summary (Signed)
Physician Discharge Summary  Miguel Roach XTG:[id number] DOB: 11-Feb-1941 DOA: 06/11/2022  PCP: Deland Pretty, MD  Admit date: 06/11/2022 Discharge date: 06/13/2022  Admitted From: Home Discharge disposition: Home  Recommendations at discharge:  At discharge, patient will continue Coreg at 6.25 mg twice daily, resume Imdur at 30 mg daily.  I recommended to keep hydralazine and Lasix on hold for now.  Patient will monitor blood pressure and volume status at home.  If blood pressure trends up more than 270 systolic, can resume hydralazine.  If patient starts to have pedal edema or worsening shortness of breath, Lasix needs to be resumed gradually.  He has an appointment with cardiology with Dr. Aundra Dubin today 8/29 but it had to be postponed because of hospitalization.  Patient will call for rescheduling. Complete course of antibiotics for next 7 days with probiotics.  Brief narrative: Miguel Roach is a 81 y.o. male with PMH significant for HTN, chronic combined systolic and diastolic CHF, nonischemic cardiomyopathy, AICD in place, CKD, arthritis Patient presented to the ED with complaint of, fever, chills, fatigue low blood pressure on the morning of presentation.  Patient reports that the previous night he ate some crab cakes that he brought from a restaurant 2 days ago.  He started having abdominal discomfort during the night and woke up with severe symptoms in the morning.   In the ED, patient was afebrile, heart rate in 70s, blood pressure low at 72/50, breathing on room air Labs showed WC count 9.2, hemoglobin 12.8, platelet 104, potassium 3.2, BUN/creatinine 44/4.46, lactic acid elevated 2.6 which trended up to 4.4 later Patient was given more than 2 L of IV fluid bolus. Blood culture was sent He was started on IV Rocephin, IV azithromycin. Chest x-ray showed minimal bibasilar subsegmental atelectasis or scaring.  Subjective: Patient was seen and examined this morning.   Lying on bed.   Not in distress.  No new symptoms.  Wife at bedside.  Feels great.  Wants to go home.   WBC count improving.  Creatinine improving.  Hospital course: Severe sepsis with hypotension - Unclear source E. coli and Pseudomonas bacteremia Presented with fever, chills, nausea, vomiting, fatigue Initial blood pressure in 70s with lactic acid level elevated over 4.4, creatinine elevated over 4 Initially started on broad spectrum IV antibiotics. Blood culture was sent.  BC ID panel showed E. coli and Pseudomonas.  Pending final culture report.  Currently improving on IV cefepime.  Patient does not want a wait for culture report.  He feels fine to go home.  WBC count improving, lactic acid level normalized.   Lives with wife, she is at bedside.  She agrees with the plan.  Discussed with pharmacy.  We will discharge him on oral ciprofloxacin 750 mg daily for 7 days with probiotics. Recent Labs  Lab 06/11/22 1005 06/11/22 1204 06/11/22 1317 06/11/22 1622 06/11/22 1814 06/12/22 0440 06/13/22 0542  WBC 9.2  --   --   --   --  12.1* 10.9*  LATICACIDVEN 2.6* 4.4* 3.1* 3.0* 3.2* 1.3  --    Hypokalemia Potassium was low at 3.2.  Improved with replacement. Recent Labs  Lab 06/11/22 1005 06/12/22 0440 06/13/22 0542  K 3.2* 4.1 3.8   Chronic combined systolic and diastolic congestive heart failure Nonischemic cardiomyopathy s/p AICD Essential hypertension PTA, patient was on carvedilol 6.25 mg twice daily, Lasix 80 mg a.m. and 40 mg p.m., hydralazine 12.5 mg 3 times daily, Imdur 30 mg daily. His blood pressure  medicines were held initially because of low blood pressure.  Currently he is only on Coreg at a lower dose of 3.125 mg twice daily others on hold.  Blood pressure remains in normal range.  Echocardiogram showed EF of 25 to 30% which is his baseline. At discharge, patient will continue Coreg at 6.25 mg twice daily, resume Imdur at 30 mg daily.  I recommended to keep hydralazine and Lasix on hold  for now.  Patient will monitor blood pressure and volume status at home.  If blood pressure trends up more than 169 systolic, can resume hydralazine.  If patient starts to have pedal edema or worsening shortness of breath, Lasix needs to be resumed gradually.  He has an appointment with cardiology with Dr. Aundra Dubin today 8/29 but it had to be postponed because of hospitalization.  Patient will call for rescheduling.  Paroxysmal atrial fibrillation Continue Coreg and amiodarone 100 mg p.o. daily. Not on anticoagulation.    CKD 4 Creatinine between 4 and 4.5 at baseline.  Remains at baseline.  Continue monitor Recent Labs    07/05/21 1214 02/13/22 1112 06/11/22 1005 06/12/22 0440 06/13/22 0542  BUN 53* 42* 44* 47* 51*  CREATININE 4.30* 4.49* 4.46* 3.98* 3.78*   Thrombocytopenia Low but stable. Recent Labs  Lab 06/11/22 1005 06/12/22 0440 06/13/22 0542  PLT 104* 76* 78*    Chronic gouty arthritis Continue allopurinol 100 mg p.o. daily.    Obstructive sleep apnea syndrome Not on CPAP  Wounds:  -    Discharge Exam:   Vitals:   06/12/22 2113 06/13/22 0000 06/13/22 0400 06/13/22 0513  BP: 126/74   117/76  Pulse: 63   63  Resp: 20 15 (!) 22 17  Temp: 97.8 F (36.6 C)   98.3 F (36.8 C)  TempSrc: Oral   Oral  SpO2: 100%   100%  Weight:      Height:        Body mass index is 22.45 kg/m (pended).  General exam: Pleasant, elderly African-American male.  Not in physical distress Skin: No rashes, lesions or ulcers. HEENT: Atraumatic, normocephalic, no obvious bleeding Lungs: Clear to auscultation bilaterally CVS: Regular rate and rhythm, no murmur GI/Abd soft, nontender, nondistended, bowel sound present CNS: Alert, awake, oriented x3 Psychiatry: Mood appropriate, Extremities: No pedal edema, no calf tenderness  Follow ups:    Follow-up Information     Deland Pretty, MD Follow up.   Specialty: Internal Medicine Contact information: [address] [place] [address] [phone]         Larey Dresser, MD .   Specialty: Cardiology Contact information: [place] [address] [phone]                 Discharge Instructions:   Discharge Instructions     Call MD for:  difficulty breathing, headache or visual disturbances   Complete by: As directed    Call MD for:  extreme fatigue   Complete by: As directed    Call MD for:  hives   Complete by: As directed    Call MD for:  persistant dizziness or light-headedness   Complete by: As directed    Call MD for:  persistant nausea and vomiting   Complete by: As directed    Call MD for:  severe uncontrolled pain   Complete by: As directed    Call MD for:  temperature >100.4   Complete by: As directed    Diet -  low sodium heart healthy   Complete by: As directed    Discharge instructions   Complete by: As directed    Recommendations at discharge:   At discharge, patient will continue Coreg at 6.25 mg twice daily, resume Imdur at 30 mg daily.  I recommended to keep hydralazine and Lasix on hold for now.  Patient will monitor blood pressure and volume status at home.  If blood pressure trends up more than 188 systolic, can resume hydralazine.  If patient starts to have pedal edema or worsening shortness of breath, Lasix needs to be resumed gradually.  He has an appointment with cardiology with Dr. Aundra Dubin today 8/29 but it had to be postponed because of hospitalization.  Patient will call for rescheduling.  Complete course of antibiotics for next 7 days with probiotics.  Discharge instructions for CHF Check weight daily -preferably same time every day. Restrict fluid intake to 1200 ml daily Restrict salt intake to less than 2 g daily. Call MD if you have one of the following symptoms 1) 3 pound weight gain in 24 hours or 5 pounds in 1 week  2) swelling in the hands, feet or stomach  3) progressive shortness of breath 4) if you have to sleep  on extra pillows at night in order to breathe     General discharge instructions: Follow with Primary MD Deland Pretty, MD in 7 days  Please request your PCP  to go over your hospital tests, procedures, radiology results at the follow up. Please get your medicines reviewed and adjusted.  Your PCP may decide to repeat certain labs or tests as needed. Do not drive, operate heavy machinery, perform activities at heights, swimming or participation in water activities or provide baby sitting services if your were admitted for syncope or siezures until you have seen by Primary MD or a Neurologist and advised to do so again. [place] Controlled Substance Reporting System database was reviewed. Do not drive, operate heavy machinery, perform activities at heights, swim, participate in water activities or provide baby-sitting services while on medications for pain, sleep and mood until your outpatient physician has reevaluated you and advised to do so again.  You are strongly recommended to comply with the dose, frequency and duration of prescribed medications. Activity: As tolerated with Full fall precautions use walker/cane & assistance as needed Avoid using any recreational substances like cigarette, tobacco, alcohol, or non-prescribed drug. If you experience worsening of your admission symptoms, develop shortness of breath, life threatening emergency, suicidal or homicidal thoughts you must seek medical attention immediately by calling 911 or calling your MD immediately  if symptoms less severe. You must read complete instructions/literature along with all the possible adverse reactions/side effects for all the medicines you take and that have been prescribed to you. Take any new medicine only after you have completely understood and accepted all the possible adverse reactions/side effects.  Wear Seat belts while driving. You were cared for by a hospitalist during your hospital stay. If you have any  questions about your discharge medications or the care you received while you were in the hospital after you are discharged, you can call the unit and ask to speak with the hospitalist or the covering physician. Once you are discharged, your primary care physician will handle any further medical issues. Please note that NO REFILLS for any discharge medications will be authorized once you are discharged, as it is imperative that you return to your primary care physician (or establish a relationship  with a primary care physician if you do not have one).   Increase activity slowly   Complete by: As directed        Discharge Medications:   Allergies as of 06/13/2022       Reactions   Benicar [olmesartan] Cough        Medication List     STOP taking these medications    furosemide 40 MG tablet Commonly known as: LASIX   hydrALAZINE 25 MG tablet Commonly known as: APRESOLINE       TAKE these medications    allopurinol 100 MG tablet Commonly known as: ZYLOPRIM Take 100 mg by mouth daily.   amiodarone 100 MG tablet Commonly known as: PACERONE TAKE 1 TABLET BY MOUTH EVERY DAY   carvedilol 6.25 MG tablet Commonly known as: COREG Take 1 tablet (6.25 mg total) by mouth 2 (two) times daily with a meal. What changed: how much to take   ciprofloxacin 750 MG tablet Commonly known as: CIPRO Take 1 tablet (750 mg total) by mouth daily for 7 days. Start taking on: June 14, 2022   Fusion 65-65-25-30 MG Caps Take 1 tablet by mouth daily.   isosorbide mononitrate 30 MG 24 hr tablet Commonly known as: IMDUR TAKE 1 TABLET BY MOUTH EVERY DAY   saccharomyces boulardii 250 MG capsule Commonly known as: FLORASTOR Take 1 capsule (250 mg total) by mouth 2 (two) times daily for 10 days.         The results of significant diagnostics from this hospitalization (including imaging, microbiology, ancillary and laboratory) are listed below for reference.    Procedures and Diagnostic  Studies:   ECHOCARDIOGRAM COMPLETE  Result Date: 06/12/2022    ECHOCARDIOGRAM REPORT   Patient Name:   Miguel Roach Date of Exam: 06/12/2022 Medical Rec #:  [id number]      Height:       71.0 in Accession #:    [id number]     Weight:       161.7 lb Date of Birth:  10-25-1940      BSA:          1.926 m Patient Age:    29 years       BP:           102/63 mmHg Patient Gender: M              HR:           66 bpm. Exam Location:  Inpatient Procedure: 2D Echo, 3D Echo, Cardiac Doppler, Color Doppler and Intracardiac            Opacification Agent Indications:    Congestive Heart Failure I50.9  History:        Patient has prior history of Echocardiogram examinations, most                 recent 11/23/2020. CHF and Cardiomyopathy; Risk                 Factors:Hypertension and Sleep Apnea.  Sonographer:    Bernadene Person RDCS Referring Phys: [id number] [place]  1. Left ventricular ejection fraction, by estimation, is 25 to 30%. The left ventricle has severely decreased function. The left ventricle demonstrates global hypokinesis. The left ventricular internal cavity size was severely dilated. Left ventricular diastolic parameters are consistent with Grade I diastolic dysfunction (impaired relaxation).  2. Right ventricular systolic function is low normal. The right ventricular size is mildly enlarged. There is mildly elevated  pulmonary artery systolic pressure. The estimated right ventricular systolic pressure is 70.3 mmHg.  3. Left atrial size was mildly dilated.  4. The mitral valve is grossly normal. Mild mitral valve regurgitation.  5. The aortic valve is tricuspid. There is mild calcification of the aortic valve. There is mild thickening of the aortic valve. Aortic valve regurgitation is mild. Aortic valve sclerosis/calcification is present, without any evidence of aortic stenosis.  6. Aortic dilatation noted. There is borderline dilatation of the ascending aorta, measuring 39 mm.  7. The inferior  vena cava is dilated in size with <50% respiratory variability, suggesting right atrial pressure of 15 mmHg. Comparison(s): Compared to prior TTE on 11/23/20, the RAP is now >10mHg and was previously normal. FINDINGS  Left Ventricle: Left ventricular ejection fraction, by estimation, is 25 to 30%. The left ventricle has severely decreased function. The left ventricle demonstrates global hypokinesis. Definity contrast agent was given IV to delineate the left ventricular endocardial borders. 3D left ventricular ejection fraction analysis performed but not reported based on interpreter judgement due to suboptimal tracking. The left ventricular internal cavity size was severely dilated. There is no left ventricular hypertrophy. Left ventricular diastolic parameters are consistent with Grade I diastolic dysfunction (impaired relaxation). Right Ventricle: The right ventricular size is mildly enlarged. No increase in right ventricular wall thickness. Right ventricular systolic function is low normal. There is mildly elevated pulmonary artery systolic pressure. The tricuspid regurgitant velocity is 2.37 m/s, and with an assumed right atrial pressure of 15 mmHg, the estimated right ventricular systolic pressure is 350.0mmHg. Left Atrium: Left atrial size was mildly dilated. Right Atrium: Right atrial size was normal in size. Pericardium: There is no evidence of pericardial effusion. Mitral Valve: The mitral valve is grossly normal. Mild mitral valve regurgitation. Tricuspid Valve: The tricuspid valve is normal in structure. Tricuspid valve regurgitation is trivial. Aortic Valve: The aortic valve is tricuspid. There is mild calcification of the aortic valve. There is mild thickening of the aortic valve. Aortic valve regurgitation is mild. Aortic regurgitation PHT measures 339 msec. Aortic valve sclerosis/calcification is present, without any evidence of aortic stenosis. Pulmonic Valve: The pulmonic valve was normal in  structure. Pulmonic valve regurgitation is trivial. Aorta: Aortic dilatation noted. There is borderline dilatation of the ascending aorta, measuring 39 mm. Venous: The inferior vena cava is dilated in size with less than 50% respiratory variability, suggesting right atrial pressure of 15 mmHg. IAS/Shunts: The atrial septum is grossly normal. Additional Comments: A device lead is visualized.  LEFT VENTRICLE PLAX 2D LVIDd:         6.90 cm      Diastology LVIDs:         6.00 cm      LV e' medial:    5.63 cm/s LV PW:         1.00 cm      LV E/e' medial:  16.7 LV IVS:        1.00 cm      LV e' lateral:   8.59 cm/s LVOT diam:     2.20 cm      LV E/e' lateral: 11.0 LV SV:         95 LV SV Index:   50 LVOT Area:     3.80 cm                              3D Volume EF: LV Volumes (MOD)  3D EF:        31 % LV vol d, MOD A2C: 247.0 ml LV EDV:       250 ml LV vol d, MOD A4C: 203.0 ml LV ESV:       172 ml LV vol s, MOD A2C: 165.0 ml LV SV:        78 ml LV vol s, MOD A4C: 147.0 ml LV SV MOD A2C:     82.0 ml LV SV MOD A4C:     203.0 ml LV SV MOD BP:      69.7 ml RIGHT VENTRICLE RV S prime:     11.90 cm/s TAPSE (M-mode): 2.0 cm LEFT ATRIUM             Index        RIGHT ATRIUM           Index LA diam:        3.50 cm 1.82 cm/m   RA Area:     20.20 cm LA Vol (A2C):   68.1 ml 35.35 ml/m  RA Volume:   58.80 ml  30.52 ml/m LA Vol (A4C):   64.7 ml 33.59 ml/m LA Biplane Vol: 73.0 ml 37.89 ml/m  AORTIC VALVE             PULMONIC VALVE LVOT Vmax:   109.00 cm/s PR End Diast Vel: 9.36 msec LVOT Vmean:  74.400 cm/s LVOT VTI:    0.251 m AI PHT:      339 msec  AORTA Ao Root diam: 3.40 cm Ao Asc diam:  3.90 cm MITRAL VALVE                TRICUSPID VALVE MV Area (PHT): 3.77 cm     TR Peak grad:   22.5 mmHg MV Decel Time: 201 msec     TR Vmax:        237.00 cm/s MV E velocity: 94.30 cm/s MV A velocity: 129.00 cm/s  SHUNTS MV E/A ratio:  0.73         Systemic VTI:  0.25 m                             Systemic Diam: 2.20 cm Gwyndolyn Kaufman MD Electronically signed by Gwyndolyn Kaufman MD Signature Date/Time: 06/12/2022/2:03:04 PM    Final    DG Chest Port 1 View  Result Date: 06/11/2022 CLINICAL DATA:  Sepsis, fever. EXAM: PORTABLE CHEST 1 VIEW COMPARISON:  December 27, 2020. FINDINGS: Stable cardiomediastinal silhouette. Stable left-sided defibrillator is noted. Minimal bibasilar subsegmental atelectasis or scarring is noted. Bony thorax is unremarkable. IMPRESSION: Minimal bibasilar subsegmental atelectasis or scarring. Electronically Signed   By: Marijo Conception M.D.   On: 06/11/2022 10:29     Labs:   Basic Metabolic Panel: Recent Labs  Lab 06/11/22 1005 06/12/22 0440 06/13/22 0542  NA 144 144 142  K 3.2* 4.1 3.8  CL 112* 112* 114*  CO2 '24 25 24  '$ GLUCOSE 111* 82 82  BUN 44* 47* 51*  CREATININE 4.46* 3.98* 3.78*  CALCIUM 8.9 8.5* 8.4*   GFR Estimated Creatinine Clearance: 16.2 mL/min (A) (by C-G formula based on SCr of 3.78 mg/dL (H)). Liver Function Tests: Recent Labs  Lab 06/11/22 1005 06/12/22 0440  AST 33 25  ALT 23 30  ALKPHOS 64 45  BILITOT 1.7* 0.8  PROT 6.1* 4.7*  ALBUMIN 3.4* 2.5*   Recent Labs  Lab 06/11/22 1005  LIPASE 29   No results for input(s): "AMMONIA" in the last 168 hours. Coagulation profile Recent Labs  Lab 06/11/22 1005  INR 1.2    CBC: Recent Labs  Lab 06/11/22 1005 06/12/22 0440 06/13/22 0542  WBC 9.2 12.1* 10.9*  NEUTROABS 8.8*  --  9.0*  HGB 12.8* 10.1* 10.4*  HCT 37.3* 29.1* 29.9*  MCV 86.1 87.1 87.2  PLT 104* 76* 78*   Cardiac Enzymes: No results for input(s): "CKTOTAL", "CKMB", "CKMBINDEX", "TROPONINI" in the last 168 hours. BNP: Invalid input(s): "POCBNP" CBG: No results for input(s): "GLUCAP" in the last 168 hours. D-Dimer No results for input(s): "DDIMER" in the last 72 hours. Hgb A1c No results for input(s): "HGBA1C" in the last 72 hours. Lipid Profile No results for input(s): "CHOL", "HDL", "LDLCALC", "TRIG", "CHOLHDL", "LDLDIRECT" in  the last 72 hours. Thyroid function studies No results for input(s): "TSH", "T4TOTAL", "T3FREE", "THYROIDAB" in the last 72 hours.  Invalid input(s): "FREET3" Anemia work up No results for input(s): "VITAMINB12", "FOLATE", "FERRITIN", "TIBC", "IRON", "RETICCTPCT" in the last 72 hours. Microbiology Recent Results (from the past 240 hour(s))  SARS Coronavirus 2 by RT PCR (hospital order, performed in [facility] hospital lab) *cepheid single result test* Anterior Nasal Swab     Status: None   Collection Time: 06/11/22 10:01 AM   Specimen: Anterior Nasal Swab  Result Value Ref Range Status   SARS Coronavirus 2 by RT PCR NEGATIVE NEGATIVE Final    Comment: (NOTE) SARS-CoV-2 target nucleic acids are NOT DETECTED.  The SARS-CoV-2 RNA is generally detectable in upper and lower respiratory specimens during the acute phase of infection. The lowest concentration of SARS-CoV-2 viral copies this assay can detect is 250 copies / mL. A negative result does not preclude SARS-CoV-2 infection and should not be used as the sole basis for treatment or other patient management decisions.  A negative result may occur with improper specimen collection / handling, submission of specimen other than nasopharyngeal swab, presence of viral mutation(s) within the areas targeted by this assay, and inadequate number of viral copies (<250 copies / mL). A negative result must be combined with clinical observations, patient history, and epidemiological information.  Fact Sheet for Patients:   https://www.patel.info/  Fact Sheet for Healthcare Providers: https://hall.com/  This test is not yet approved or  cleared by the [place] FDA and has been authorized for detection and/or diagnosis of SARS-CoV-2 by FDA under an Emergency Use Authorization (EUA).  This EUA will remain in effect (meaning this test can be used) for the duration of the COVID-19 declaration under  Section 564(b)(1) of the Act, 21 U.S.C. section 360bbb-3(b)(1), unless the authorization is terminated or revoked sooner.  Performed at [facility], [address] [place], [place] [address]   Resp Panel by RT-PCR (Flu A&B, Covid) Anterior Nasal Swab     Status: None   Collection Time: 06/11/22 10:01 AM   Specimen: Anterior Nasal Swab  Result Value Ref Range Status   SARS Coronavirus 2 by RT PCR NEGATIVE NEGATIVE Final    Comment: (NOTE) SARS-CoV-2 target nucleic acids are NOT DETECTED.  The SARS-CoV-2 RNA is generally detectable in upper respiratory specimens during the acute phase of infection. The lowest concentration of SARS-CoV-2 viral copies this assay can detect is 138 copies/mL. A negative result does not preclude SARS-Cov-2 infection and should not be used as the sole basis for treatment or other patient management decisions. A negative result may occur with  improper specimen collection/handling,  submission of specimen other than nasopharyngeal swab, presence of viral mutation(s) within the areas targeted by this assay, and inadequate number of viral copies(<138 copies/mL). A negative result must be combined with clinical observations, patient history, and epidemiological information. The expected result is Negative.  Fact Sheet for Patients:  EntrepreneurPulse.com.au  Fact Sheet for Healthcare Providers:  IncredibleEmployment.be  This test is no t yet approved or cleared by the [place] FDA and  has been authorized for detection and/or diagnosis of SARS-CoV-2 by FDA under an Emergency Use Authorization (EUA). This EUA will remain  in effect (meaning this test can be used) for the duration of the COVID-19 declaration under Section 564(b)(1) of the Act, 21 U.S.C.section 360bbb-3(b)(1), unless the authorization is terminated  or revoked sooner.       Influenza A by PCR NEGATIVE NEGATIVE Final   Influenza B by  PCR NEGATIVE NEGATIVE Final    Comment: (NOTE) The Xpert Xpress SARS-CoV-2/FLU/RSV plus assay is intended as an aid in the diagnosis of influenza from Nasopharyngeal swab specimens and should not be used as a sole basis for treatment. Nasal washings and aspirates are unacceptable for Xpert Xpress SARS-CoV-2/FLU/RSV testing.  Fact Sheet for Patients: EntrepreneurPulse.com.au  Fact Sheet for Healthcare Providers: IncredibleEmployment.be  This test is not yet approved or cleared by the [place] FDA and has been authorized for detection and/or diagnosis of SARS-CoV-2 by FDA under an Emergency Use Authorization (EUA). This EUA will remain in effect (meaning this test can be used) for the duration of the COVID-19 declaration under Section 564(b)(1) of the Act, 21 U.S.C. section 360bbb-3(b)(1), unless the authorization is terminated or revoked.  Performed at [facility], [address] [place], [place] [address]   Blood Culture (routine x 2)     Status: Abnormal (Preliminary result)   Collection Time: 06/11/22 10:05 AM   Specimen: BLOOD  Result Value Ref Range Status   Specimen Description   Final    BLOOD LEFT ANTECUBITAL Performed at [facility], [address] [place], [place] [address]    Special Requests   Final    BOTTLES DRAWN AEROBIC AND ANAEROBIC Blood Culture results may not be optimal due to an inadequate volume of blood received in culture bottles Performed at [facility], [address] [place], [place] [address]    Culture  Setup Time   Final    GRAM NEGATIVE RODS AEROBIC BOTTLE ONLY CRITICAL RESULT CALLED TO, READ BACK BY AND VERIFIED WITH: PHARMD Melodye Ped [id number] FCP    Culture (A)  Final    ESCHERICHIA COLI SUSCEPTIBILITIES TO FOLLOW CULTURE REINCUBATED FOR BETTER GROWTH Performed at [facility], [place] [address] [place], [place] [address]    Report Status PENDING   Incomplete  Blood Culture ID Panel (Reflexed)     Status: Abnormal   Collection Time: 06/11/22 10:05 AM  Result Value Ref Range Status   Enterococcus faecalis NOT DETECTED NOT DETECTED Final   Enterococcus Faecium NOT DETECTED NOT DETECTED Final   Listeria monocytogenes NOT DETECTED NOT DETECTED Final   Staphylococcus species NOT DETECTED NOT DETECTED Final   Staphylococcus aureus (BCID) NOT DETECTED NOT DETECTED Final   Staphylococcus epidermidis NOT DETECTED NOT DETECTED Final   Staphylococcus lugdunensis NOT DETECTED NOT DETECTED Final   Streptococcus species NOT DETECTED NOT DETECTED Final   Streptococcus agalactiae NOT DETECTED NOT DETECTED Final   Streptococcus pneumoniae NOT DETECTED NOT DETECTED Final   Streptococcus  pyogenes NOT DETECTED NOT DETECTED Final   A.calcoaceticus-baumannii NOT DETECTED NOT DETECTED Final   Bacteroides fragilis NOT DETECTED NOT DETECTED Final   Enterobacterales DETECTED (A) NOT DETECTED Final    Comment: Enterobacterales represent a large order of gram negative bacteria, not a single organism. CRITICAL RESULT CALLED TO, READ BACK BY AND VERIFIED WITH: PHARMD J. GADHIA [id number] FCP    Enterobacter cloacae complex NOT DETECTED NOT DETECTED Final   Escherichia coli DETECTED (A) NOT DETECTED Final    Comment: CRITICAL RESULT CALLED TO, READ BACK BY AND VERIFIED WITH: PHARMD J. Scherrie November [phone] [id number] FCP    Klebsiella aerogenes NOT DETECTED NOT DETECTED Final   Klebsiella oxytoca NOT DETECTED NOT DETECTED Final   Klebsiella pneumoniae NOT DETECTED NOT DETECTED Final   Proteus species NOT DETECTED NOT DETECTED Final   Salmonella species NOT DETECTED NOT DETECTED Final   Serratia marcescens NOT DETECTED NOT DETECTED Final   Haemophilus influenzae NOT DETECTED NOT DETECTED Final   Neisseria meningitidis NOT DETECTED NOT DETECTED Final   Pseudomonas aeruginosa DETECTED (A) NOT DETECTED Final    Comment: CRITICAL RESULT CALLED TO, READ BACK BY AND VERIFIED  WITH: PHARMD Melodye Ped [id number] FCP    Stenotrophomonas maltophilia NOT DETECTED NOT DETECTED Final   Candida albicans NOT DETECTED NOT DETECTED Final   Candida auris NOT DETECTED NOT DETECTED Final   Candida glabrata NOT DETECTED NOT DETECTED Final   Candida krusei NOT DETECTED NOT DETECTED Final   Candida parapsilosis NOT DETECTED NOT DETECTED Final   Candida tropicalis NOT DETECTED NOT DETECTED Final   Cryptococcus neoformans/gattii NOT DETECTED NOT DETECTED Final   CTX-M ESBL NOT DETECTED NOT DETECTED Final   Carbapenem resistance IMP NOT DETECTED NOT DETECTED Final   Carbapenem resistance KPC NOT DETECTED NOT DETECTED Final   Carbapenem resistance NDM NOT DETECTED NOT DETECTED Final   Carbapenem resist OXA 48 LIKE NOT DETECTED NOT DETECTED Final   Carbapenem resistance VIM NOT DETECTED NOT DETECTED Final    Comment: Performed at [facility], [address], [place], [place] [address]  Blood Culture (routine x 2)     Status: None (Preliminary result)   Collection Time: 06/11/22 10:23 AM   Specimen: BLOOD RIGHT FOREARM  Result Value Ref Range Status   Specimen Description   Final    BLOOD RIGHT FOREARM BLOOD Performed at [facility], [address] [place], [place] [address]    Special Requests   Final    Blood Culture adequate volume BOTTLES DRAWN AEROBIC AND ANAEROBIC Performed at [facility], [address] [place], [place] [address]    Culture  Setup Time   Final    GRAM NEGATIVE RODS IN BOTH AEROBIC AND ANAEROBIC BOTTLES CRITICAL VALUE NOTED.  VALUE IS CONSISTENT WITH PREVIOUSLY REPORTED AND CALLED VALUE.    Culture   Final    GRAM NEGATIVE RODS CULTURE REINCUBATED FOR BETTER GROWTH Performed at [facility], [place] [address] [place], [place] [address]    Report Status PENDING  Incomplete  Urine Culture     Status: None   Collection Time: 06/11/22  1:41 PM   Specimen: Urine, Random  Result Value Ref Range Status   Specimen  Description   Final    URINE, RANDOM Performed at [facility], [address] [place], [place] [address]    Special Requests   Final    NONE Performed at [facility],  [place], [place] [address]    Culture   Final    NO GROWTH Performed at [facility], [place] [address] [place], [place] [address]    Report Status 06/12/2022 FINAL  Final  Expectorated Sputum Assessment w Gram Stain, Rflx to Resp Cult     Status: None   Collection Time: 06/12/22 12:45 PM   Specimen: Sputum  Result Value Ref Range Status   Specimen Description SPUTUM  Final   Special Requests NONE  Final   Sputum evaluation   Final    Sputum specimen not acceptable for testing.  Please recollect.   Performed at [facility], [place] [address] [place], [place] [address]    Report Status 06/12/2022 FINAL  Final    Time coordinating discharge: 35 minutes  Signed: Jaydon Soroka  [facility] 06/13/2022, 11:38 AM

## 2022-06-13 NOTE — Plan of Care (Signed)
  Problem: Health Behavior/Discharge Planning: Goal: Ability to manage health-related needs will improve Outcome: Progressing   Problem: Clinical Measurements: Goal: Ability to maintain clinical measurements within normal limits will improve Outcome: Progressing Goal: Will remain free from infection Outcome: Progressing Goal: Diagnostic test results will improve Outcome: Progressing Goal: Respiratory complications will improve Outcome: Progressing Goal: Cardiovascular complication will be avoided Outcome: Progressing   Problem: Coping: Goal: Level of anxiety will decrease Outcome: Progressing   Problem: Activity: Goal: Ability to tolerate increased activity will improve Outcome: Progressing   Problem: Clinical Measurements: Goal: Ability to maintain a body temperature in the normal range will improve Outcome: Progressing   Problem: Respiratory: Goal: Ability to maintain adequate ventilation will improve Outcome: Progressing Goal: Ability to maintain a clear airway will improve Outcome: Progressing   Problem: Elimination: Goal: Will not experience complications related to bowel motility Outcome: Completed/Met Goal: Will not experience complications related to urinary retention Outcome: Completed/Met   Problem: Pain Managment: Goal: General experience of comfort will improve Outcome: Completed/Met

## 2022-06-13 NOTE — Progress Notes (Signed)
Pt discharged home today per Dr. Pietro Cassis. Pt's IV sites D/C'd and WDL. Pt's VSS. Pt provided with home medication list, discharge instructions and prescriptions. Verbalized understanding. Pt left floor via WC in stable condition accompanied by RN.

## 2022-06-13 NOTE — Progress Notes (Signed)
  Transition of Care [facility]) Screening Note   Patient Details  Name: Miguel Roach Date of Birth: 06-Jan-1941   Transition of Care [facility]) CM/SW Contact:    Dessa Phi, RN Phone Number: 06/13/2022, 11:43 AM    Transition of Care Department [facility]) has reviewed patient and no TOC needs have been identified at this time. We will continue to monitor patient advancement through interdisciplinary progression rounds. If new patient transition needs arise, please place a TOC consult.

## 2022-06-13 NOTE — Progress Notes (Signed)
Mobility Specialist - Progress Note     06/13/22 0955  Mobility  HOB Elevated/Bed Position Self regulated  Activity Ambulated independently in hallway  Range of Motion/Exercises Active  Level of Assistance Independent  Assistive Device None  Distance Ambulated (ft) 500 ft  Activity Response Tolerated well  $Mobility charge 1 Mobility   Pt was found in bed and agreeable to mobilize. Had no complaints during ambulation and at EOS returned back to bed with all necessities in reach and family in room.  Ferd Hibbs Mobility Specialist

## 2022-06-15 LAB — CULTURE, BLOOD (ROUTINE X 2): Special Requests: ADEQUATE

## 2022-06-16 ENCOUNTER — Encounter (HOSPITAL_COMMUNITY): Payer: Self-pay

## 2022-06-16 ENCOUNTER — Ambulatory Visit (HOSPITAL_COMMUNITY)
Admit: 2022-06-16 | Discharge: 2022-06-16 | Disposition: A | Discharge status: Home / Self Care | Payer: Medicare PPO | Attending: Family Medicine | Admitting: Family Medicine

## 2022-06-16 VITALS — BP 120/66 | HR 56 | Wt 168.6 lb

## 2022-06-16 DIAGNOSIS — I480 Paroxysmal atrial fibrillation: Secondary | ICD-10-CM | POA: Insufficient documentation

## 2022-06-16 DIAGNOSIS — M109 Gout, unspecified: Secondary | ICD-10-CM | POA: Insufficient documentation

## 2022-06-16 DIAGNOSIS — I2510 Atherosclerotic heart disease of native coronary artery without angina pectoris: Secondary | ICD-10-CM | POA: Diagnosis not present

## 2022-06-16 DIAGNOSIS — I428 Other cardiomyopathies: Secondary | ICD-10-CM | POA: Insufficient documentation

## 2022-06-16 DIAGNOSIS — N184 Chronic kidney disease, stage 4 (severe): Secondary | ICD-10-CM | POA: Diagnosis not present

## 2022-06-16 DIAGNOSIS — I5022 Chronic systolic (congestive) heart failure: Secondary | ICD-10-CM | POA: Diagnosis not present

## 2022-06-16 DIAGNOSIS — G4733 Obstructive sleep apnea (adult) (pediatric): Secondary | ICD-10-CM | POA: Insufficient documentation

## 2022-06-16 DIAGNOSIS — R7989 Other specified abnormal findings of blood chemistry: Secondary | ICD-10-CM | POA: Insufficient documentation

## 2022-06-16 DIAGNOSIS — Z79899 Other long term (current) drug therapy: Secondary | ICD-10-CM | POA: Insufficient documentation

## 2022-06-16 DIAGNOSIS — Z95810 Presence of automatic (implantable) cardiac defibrillator: Secondary | ICD-10-CM | POA: Diagnosis not present

## 2022-06-16 DIAGNOSIS — I5023 Acute on chronic systolic (congestive) heart failure: Secondary | ICD-10-CM | POA: Diagnosis not present

## 2022-06-16 DIAGNOSIS — I472 Ventricular tachycardia, unspecified: Secondary | ICD-10-CM | POA: Insufficient documentation

## 2022-06-16 LAB — BASIC METABOLIC PANEL
Anion gap: 6 (ref 5–15)
BUN: 34 mg/dL — ABNORMAL HIGH (ref 8–23)
CO2: 23 mmol/L (ref 22–32)
Calcium: 8.4 mg/dL — ABNORMAL LOW (ref 8.9–10.3)
Chloride: 115 mmol/L — ABNORMAL HIGH (ref 98–111)
Creatinine, Ser: 3.48 mg/dL — ABNORMAL HIGH (ref 0.61–1.24)
GFR, Estimated: 17 mL/min — ABNORMAL LOW (ref >60)
Glucose, Bld: 95 mg/dL (ref 70–99)
Potassium: 4.2 mmol/L (ref 3.5–5.1)
Sodium: 144 mmol/L (ref 135–145)

## 2022-06-16 LAB — BRAIN NATRIURETIC PEPTIDE: B Natriuretic Peptide: 1143.1 pg/mL — ABNORMAL HIGH (ref 0.0–100.0)

## 2022-06-16 MED ORDER — FUROSEMIDE 40 MG PO TABS: 40.0000 mg | ORAL_TABLET | Freq: Every day | ORAL | 11 refills | Status: DC

## 2022-06-16 NOTE — Progress Notes (Signed)
error 

## 2022-06-16 NOTE — Patient Instructions (Addendum)
EKG done today.  Labs done today. We will contact you only if your labs are abnormal.  RESTART Lasix '40mg'$  (1 tablet) by mouth daily.   No other medication changes were made. Please continue all current medications as prescribed.  Your physician recommends that you schedule a follow-up appointment in: 10-14 days for lab only appointment and in 4 months with Dr. Aundra Dubin. Please contact our office in November to schedule a January appointment.   If you have any questions or concerns before your next appointment please send [place] a message through [place] or call our office at [phone].    TO LEAVE A MESSAGE FOR THE NURSE SELECT OPTION 2, PLEASE LEAVE A MESSAGE INCLUDING: YOUR NAME DATE OF BIRTH CALL BACK NUMBER REASON FOR CALL**this is important as we prioritize the call backs  YOU WILL RECEIVE A CALL BACK THE SAME DAY AS LONG AS YOU CALL BEFORE 4:00 PM   Do the following things EVERYDAY: Weigh yourself in the morning before breakfast. Write it down and keep it in a log. Take your medicines as prescribed Eat low salt foods--Limit salt (sodium) to 2000 mg per day.  Stay as active as you can everyday Limit all fluids for the day to less than 2 liters   At the [facility], you and your health needs are our priority. As part of our continuing mission to provide you with exceptional heart care, we have created designated Provider Care Teams. These Care Teams include your primary Cardiologist (physician) and Advanced Practice Providers (APPs- Physician Assistants and Nurse Practitioners) who all work together to provide you with the care you need, when you need it.   You may see any of the following providers on your designated Care Team at your next follow up: Dr Glori Bickers Dr Haynes Kerns, NP Lyda Jester, [place] Audry Riles, PharmD   Please be sure to bring in all your medications bottles to every appointment.

## 2022-06-16 NOTE — Progress Notes (Signed)
ReDS Vest / Clip - 06/16/22 1500       ReDS Vest / Clip   Station Marker C    Ruler Value 25    ReDS Value Range Moderate volume overload    ReDS Actual Value 36

## 2022-06-16 NOTE — Progress Notes (Signed)
ID:  Franklin, Baumbach [birth year], MRN [id number]   Provider location: [place] [facility] Type of Visit: Established patient   PCP:  Deland Pretty, MD  HF Cardiologist:  Dr. Aundra Dubin   HPI: Miguel Roach is a 81 y.o. male who has a history of chronic systolic CHF/nonischemic cardiomyopathy and CKD. He actually had an echo back in 2010 with EF 40-45% at the time.  He says that this really was not followed up upon by a cardiologist and that he did well for a number of years after that. In 6/16, he was admitted with acute systolic CHF.  Echo showed EF down to 20-25%.  Cardiac cath showed nonobstructive CAD.  His Lasix has been gradually increased.  This has brought his weight down and improved his breathing, but creatinine has risen. He had been on olmesartan but this was stopped because he thought it was causing cough.  He was then put on losartan, but this was stopped with rise in creatinine. Also intolerant to Bidil with orthostatic symptoms.  He was started on digoxin.  Echo in 12/16 showed persistently low EF, 15-20%.  Medtronic ICD was placed in 2/17.  Repeat echo 1/18 showed EF 25% with diffuse hypokinesis.   In 3/18, he had an episode of about 10 minutes atrial fibrillation noted on his ICD.  He also had 1 episode of VT terminated by an appropriate discharge.  He had push-mowed his grass and was very fatigued when he had the shock.  Since then, he has had no further arrhythmias.   Echo in 6/19 showed EF 25-30% with mild LV dilation and mild-moderate AI, normal RV size with mildly decreased systolic function, IVC normal.   Admitted 1/27 - 11/17/2018 with hypotension and found to have UTI/sepsis. Treated on ABX and IVF. Eventually transitioned back to po diuretics with HF team following. 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 Noted to have increased NSVT and PVCs, started on amiodarone.  Zio patch in 6/20 showed occasional PVCs, burden low.   Echo in 8/20 showed EF 35%, severe LV  dilation, normal RV size and systolic function, mild-moderate AI.   Echo in 2/22 showed EF 25-30%, severe LV dilation, septal-lateral dyssynchrony, mild LVH, low normal RV function, mild-moderate AI.   Admitted 8/23 w/ sepsis. Blood Cx showed e-coli and pseudomonas. Received IV abx. Echo showed EF 25-30%, RV low/normal. BP low and GDMT held. Discharged home off Lasix & hydralazine, weight 168 lbs.  Today he returns for post hospital HF follow up with his wife. Overall feeling fine. Has not been as active since being home, but no significant dyspnea with ADLs or walking to mailbox. Plans to get back to walking at [facility] soon. Denies palpitations, CP, dizziness, edema, or PND/Orthopnea. Appetite ok. No fever or chills. Weight at home 168 pounds. Taking all medications. Remains off lasix and hydralazine. BP at home 120s/80s.  ReDs: 36%  ECG (personally reviewed): SB 57 bpm, IVCD, QRS 132 msec   Medtronic device interrogation (personally reviewed): OptiVol up, thoracic impedence down, 1 hr day/activity, no AF/VT  Labs (6/16): BNP 1954, TSH normal Labs (7/16): K 4.1, creatinine 1.72 Labs (8/16): creatinine 2.3 Labs (9/16): digoxin 0.3, K 4, creatinine 2.14, SPEP negative, UPEP negative Labs (10/16): K 4.6, creatinine 1.62 Labs (11/16): K 4.4, creatinine 2.33, digoxin 0.5 Labs (12/16): creatinine 2.1 (nephrology) Labs (1/17): digoxin 0.6, uric acid 10.4 Labs (2/17): K 4, creatinine 2.13, hgb 11.6 Labs (5/17): K 3.8, creatinine 2.22, digoxin 0.7 Labs (9/17): K  4.4, creatinine 2.88, LFTs normal, digoxin 1.0, hgb 11.8, LDL 88, HDL 50 Labs (1/18): digoxin 0.5 Labs (3/18): K 4.4, creatinine 3.17 Labs (4/18): K 4.3, creatinine 3.39, digoxin 0.6 Labs (10/18): K 4.5, creatinine 3.65, digoxin 0.4 Labs (11/18): K 4, creatinine 3.24, digoxin 0.6 Labs (12/18): digoxin < 0.2 Labs (1/19): K 3.8, creatinine 3.15 Labs (2/19): K 4.0, creatinine 3.23 Labs (3/19): digoxin 0.4, K 3.8, creatinine 3.19 Labs  (6/19): digoxin 0.3, K 3.9, creatinine 3.12 Labs (9/19): K 3.9, creatinine 3.58, digoxin 0.3, LDL 91 Labs (3/20): K 4, creatinine 3.71 Labs (6/20): K 3.9, creatinine 3.94, LFTs normal, TSH normal Labs (8/20): K 3.7, creatinine 4.08, LFTs normal, TSH normal Labs (1/22): K 3.5, creatinine 4.34, LFTs normal Labs (3/22): K 3.4, creatinine 4.43 Labs (5/22): K 3.7, creatinine 4.59, LFTs normal, TSH normal Labs (9/22): K 3.5, creatinine 4.3, TSH normal Labs (8/23): K 3.8, creatinine 3.78  PMH: 1. CKD: Stage IV.  Follows with Dr Posey Pronto.  2. Gout 3. Chronic systolic CHF: Nonischemic cardiomyopathy.  EF 40-45% in 2010.  Echo (6/16) with EF 20-25%, severe LV dilation, mild MR, mild AI, moderate LAE.  LHC/RHC (6/16) with nonobstructive CAD; mean RA 2, RV 48/1, PA 47/18, mean PCWP 12, CI 3.2.  CPX (10/16) with peak VO2 13.1 (50% predicted), VE/VCO2 slope 39.3, RER 1.22 => moderate to severe functional limitation.  Echo (12/16) with EF 15-20%, severe LV dilation, diffuse hypokinesis, moderate AI, mild MR, normal RV size and systolic function. Medtronic ICD.  - CPX (11/17): Submaximal with RER 0.95, VE/VCO2 slope 27, peak VO2 18.2.  Suspect no more than mild HF limitation.  - Echo (1/18): EF 25%, diffuse hypokinesis, normal RV size and systolic function, mild AI.  - Echo (6/19): EF 25-30% with mild LV dilation and mild-moderate AI, normal RV size with mildly decreased systolic function, IVC normal.  - Echo (8/20): EF 35%, severe LV dilation, normal RV size and systolic function, mild-moderate AI.  - Echo (2/22): EF 25-30%, severe LV dilation, septal-lateral dyssynchrony, mild LVH, low normal RV function, mild-moderate AI.  - Echo (8/23): EF 25-30%, severe LV dysfunction, grade I DD, RV low/normal, mild AI, AAA 39 mm. 4. ACEI cough.  5. Pleural effusion: Thoracentesis on right in 8/16.  It was a transudate, likely due to CHF.  6. OSA: To start Bipap.  7. Esophageal duplication cyst.  8. High resolution CT  chest 12/17: No evidence for interstitial lung disease.  9. Atrial fibrillation: Paroxysmal, 1 10 minute episode in 3/18.  10. VT: ICD discharge 3/18.  11. 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  - Zio patch in 6/20: occasional PVCs, low burden overall.  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   FH: Mother with "weak heart." Brother with sickle cell anemia.   ROS: All systems reviewed and negative except as per HPI.   Current Outpatient Medications  Medication Sig Dispense Refill   allopurinol (ZYLOPRIM) 100 MG tablet Take 100 mg by mouth daily.     amiodarone (PACERONE) 100 MG tablet TAKE 1 TABLET BY MOUTH EVERY DAY 90 tablet 3   carvedilol (COREG) 6.25 MG tablet Take 1 tablet (6.25 mg total) by mouth 2 (two) times daily with a meal. 180 tablet 3   ciprofloxacin (CIPRO) 750 MG tablet Take 1 tablet (750 mg total) by mouth daily for 7 days. 7 tablet 0   Fe Fum-Fe Poly-Vit C-Lactobac (FUSION) 65-65-25-30 MG CAPS Take 1 tablet by mouth daily.     isosorbide mononitrate (IMDUR) 30  MG 24 hr tablet TAKE 1 TABLET BY MOUTH EVERY DAY 90 tablet 3   saccharomyces boulardii (FLORASTOR) 250 MG capsule Take 1 capsule (250 mg total) by mouth 2 (two) times daily for 10 days. (Patient taking differently: Take 250 mg by mouth daily.) 20 capsule 0   No current facility-administered medications for this encounter.  BP 120/66   Pulse (!) 56   Wt 76.5 kg (168 lb 9.6 oz)   SpO2 97%   BMI 23.51 kg/m   Wt Readings from Last 3 Encounters:  06/16/22 76.5 kg (168 lb 9.6 oz)  06/11/22 (P) 73 kg (160 lb 15 oz)  02/13/22 75.2 kg (165 lb 12.8 oz)   Physical Exam General:  NAD. No resp difficulty, walked into clinic HEENT: Normal Neck: Supple. No JVD. Carotids 2+ bilat; no bruits. No lymphadenopathy or thryomegaly appreciated. Cor: PMI nondisplaced. Regular rate & rhythm. No rubs, gallops, 2/6 SEM RUSB Lungs: Clear Abdomen: Soft, nontender, nondistended. No hepatosplenomegaly. No bruits or masses.  Good bowel sounds. Extremities: No cyanosis, clubbing, rash, edema Neuro: Alert & oriented x 3, cranial nerves grossly intact. Moves all 4 extremities w/o difficulty. Affect pleasant.  Assessment/Plan: 1. Chronic systolic CHF: Nonischemic cardiomyopathy.  CO preserved on RHC in 6/16, but 10/16 CPX showed moderate to severe functional impairment (seems worse than his symptoms would suggest).  However, repeat CPX in 11/17, though submaximal, suggested no more than mild HF limitation. No significant CAD with coronary angiography in 6/16.  No hx heavy ETOH or drug abuse. No marked HTN.  Negative SPEP/UPEP.  Possible prior myocarditis.  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  He now has Medtronic ICD.  Echo in 6/19 showed EF 25-30% (stable) with mild-moderate AI and mildly decreased RV systolic function.  Echo in 8/20 showed EF mildly higher at 35%, severe LV dilation, normal RV, mild-moderate AI.  Echo in 2/22 showed EF back down to 25-30%, severe LV dilation, septal-lateral dyssynchrony, mild LVH, low normal RV function, mild-moderate AI.  Echo (8/23) showed EF 25-30%, RV low/normal, mild AI. NYHA class II-early III symptoms.  He does not appear volume overloaded on exam but Optivol elevated.  - Restart Lasix 40 mg daily (previously on 80 bid alternating with 80/40 every other day). BMET and BNP today, repeat BMET in 10 days. - I will ask device RN to send OptiVol transmission in 7 days to follow fluid. - Continue Coreg 6.25 mg bid - Off spironolactone with elevated creatinine.    - Off ACEI/ARB/Entresto with elevated creatinine.  - Off hydralazine with low BP during recent admit. If sBP > 140, would restart at 12.5 mg (he gets dizzy with increased dose) - GFR too low for SGLT2 inhibitor - He is off digoxin with rising creatinine.  - IVCD 132 msec on ECG => Gradually widening.  Getting towards the point where he may benefit from CRT upgrade.  Will review with EP.   2.  CKD: Stage IV.  Creatinine has been >4 recently.  He follows with Dr Posey Pronto.  3. OSA: Bipap. 4. Gout: Gout controlled on allopurinol. 5. VT/PVCs: Appropriate ICD discharge in 3/18, no recurrence. He is now on amiodarone due to very frequent PVCs. Zio patch in 6/20 showed low burden of PVCs.  - Continue Coreg as above.  - Continue amiodarone 100 mg daily. Stable LFTs and TSH (5/23)  He will need regular eye exams.  6. Atrial fibrillation: Paroxysmal.  Very short episodes only noted by device interrogation, none recently.  He has had no prolonged or symptomatic atrial fibrillation (data for anticoagulating short runs of atrial fibrillation is not strong), so will hold off on anticoagulation.  - If he develops prolonged or symptomatic atrial fibrillation, would anticoagulate.   Follow up in 4 months with Dr. Aundra Dubin.   Signed, Rafael Bihari, FNP  06/16/2022  [facility] [address] [facility] [place] [address] [phone] (office) [phone] (fax)

## 2022-06-20 DIAGNOSIS — Z09 Encounter for follow-up examination after completed treatment for conditions other than malignant neoplasm: Secondary | ICD-10-CM | POA: Diagnosis not present

## 2022-06-20 DIAGNOSIS — I119 Hypertensive heart disease without heart failure: Secondary | ICD-10-CM | POA: Diagnosis not present

## 2022-06-20 DIAGNOSIS — I5022 Chronic systolic (congestive) heart failure: Secondary | ICD-10-CM | POA: Diagnosis not present

## 2022-06-20 DIAGNOSIS — Z95810 Presence of automatic (implantable) cardiac defibrillator: Secondary | ICD-10-CM | POA: Diagnosis not present

## 2022-06-20 DIAGNOSIS — A419 Sepsis, unspecified organism: Secondary | ICD-10-CM | POA: Diagnosis not present

## 2022-06-23 ENCOUNTER — Encounter: Payer: Self-pay | Admitting: Cardiology

## 2022-06-27 ENCOUNTER — Ambulatory Visit (HOSPITAL_COMMUNITY)
Admission: RE | Admit: 2022-06-27 | Discharge: 2022-06-27 | Disposition: A | Discharge status: Home / Self Care | Payer: Medicare PPO | Source: Ambulatory Visit | Attending: Internal Medicine | Admitting: Internal Medicine

## 2022-06-27 ENCOUNTER — Telehealth: Payer: Self-pay

## 2022-06-27 DIAGNOSIS — I5022 Chronic systolic (congestive) heart failure: Secondary | ICD-10-CM | POA: Diagnosis not present

## 2022-06-27 LAB — BASIC METABOLIC PANEL
Anion gap: 5 (ref 5–15)
BUN: 31 mg/dL — ABNORMAL HIGH (ref 8–23)
CO2: 22 mmol/L (ref 22–32)
Calcium: 8.8 mg/dL — ABNORMAL LOW (ref 8.9–10.3)
Chloride: 115 mmol/L — ABNORMAL HIGH (ref 98–111)
Creatinine, Ser: 3.69 mg/dL — ABNORMAL HIGH (ref 0.61–1.24)
GFR, Estimated: 16 mL/min — ABNORMAL LOW (ref >60)
Glucose, Bld: 103 mg/dL — ABNORMAL HIGH (ref 70–99)
Potassium: 4.3 mmol/L (ref 3.5–5.1)
Sodium: 142 mmol/L (ref 135–145)

## 2022-06-27 NOTE — Telephone Encounter (Signed)
ICM call to patient.  Advised automatic [employer] transmission was not received.  Requested to send manual remote transmission for fluid level review.  He will call back for assistance when he returns home.  He has ICM number.

## 2022-06-27 NOTE — Telephone Encounter (Signed)
Spoke with patient and attempted to assist with sending manual remote transmission and unsuccessful.  Provided Carelink Tech support number.  He will call tech support and will call back with information regarding monitor and transmission.

## 2022-06-27 NOTE — Telephone Encounter (Signed)
Pt left voice mail message that he contacted Medtronic tech support and should receive a new monitor in 7 to 10 business days.  Will reschedule ICM remote transmission for 9/20.

## 2022-06-28 ENCOUNTER — Other Ambulatory Visit (HOSPITAL_COMMUNITY): Payer: Medicare PPO

## 2022-07-02 ENCOUNTER — Other Ambulatory Visit (HOSPITAL_COMMUNITY): Payer: Self-pay | Admitting: Cardiology

## 2022-07-03 ENCOUNTER — Telehealth: Payer: Self-pay

## 2022-07-03 NOTE — Telephone Encounter (Signed)
Attempted return call to patient regarding 9/15 remote transmission.  Left voice mail message for patient to send manual remote transmission to check fluid levels as requested by HF clinic.  Provided cal back number if assistance is needed to sent transmission.

## 2022-07-04 ENCOUNTER — Ambulatory Visit (INDEPENDENT_AMBULATORY_CARE_PROVIDER_SITE_OTHER): Payer: Medicare PPO

## 2022-07-04 ENCOUNTER — Encounter (HOSPITAL_COMMUNITY): Payer: Self-pay | Admitting: Cardiology

## 2022-07-04 DIAGNOSIS — Z95810 Presence of automatic (implantable) cardiac defibrillator: Secondary | ICD-10-CM | POA: Diagnosis not present

## 2022-07-04 DIAGNOSIS — I5022 Chronic systolic (congestive) heart failure: Secondary | ICD-10-CM

## 2022-07-04 MED ORDER — POTASSIUM CHLORIDE CRYS ER 20 MEQ PO TBCR: 20.0000 meq | EXTENDED_RELEASE_TABLET | Freq: Every day | ORAL | 11 refills | Status: DC

## 2022-07-04 MED ORDER — FUROSEMIDE 40 MG PO TABS: 80.0000 mg | ORAL_TABLET | Freq: Every day | ORAL | 11 refills | Status: DC

## 2022-07-04 NOTE — Progress Notes (Signed)
EPIC Encounter for ICM Monitoring  Patient Name: Miguel Roach is a 81 y.o. male Date: 07/04/2022 Primary Care Physican: Deland Pretty, MD Primary Cardiologist: Aundra Dubin Electrophysiologist: Lovena Le Nephrologist: [place] (every 3 months) 02/06/2022 Weight: 163 lbs 04/19/2022 Weight: 158-159 lbs 07/04/2022 Weight: 161.2 lbs (baseline 158-162 lbs)     Spoke with patient and heart failure questions reviewed.  Pt is feeling better since hospital discharge.  Weight is stable, breathing at baseline and no lower extremity swelling.  He has returned to [facility] exercising.  Lasix was stopped during hospitalization and restarted on 9/1 during HF OV.     Optivol thoracic impedance suggesting possible fluid accumulation starting 8/26 but improving (hospitalized 8/27 for sepsis).   Prescribed:  Furosemide 40 mg take 1 tablet (40 mg total) daily (started 9/1)   Labs: 06/27/2022 Creatinine 3.69, BUN 31, Potassium 4.3, Sodium 142, GFR 16 06/16/2022 Creatinine 3.48, BUN 34, Potassium 4.2, Sodium 144, GFR 17  06/13/2022 Creatinine 3.78, BUN 51, Potassium 3.8, Sodium 142, GFR 15  02/13/2022 Creatinine 4.49, BUN 42, Potassium 4.1, Sodium 142, GFR 13 07/05/2021 Creatinine 4.30, BUN 53, Potassium 3.5, Sodium 143, GFR 13 03/01/2021 Creatinine 4.59, BUN 52, Potassium 3.7, Sodium 143, GFR 12 12/27/2020 Creatinine 4.43, BUN 49, Potassium 3.4, Sodium 140, GFR 13 10/26/2020 Creatinine 4.39, BUN 57, Potassium 3.5, Sodium 146, GFR 13 A complete set of results can be found in Results Review.     Recommendations:   Copy sent to Allena Katz NP at [facility] clinic for review and recommendations if needed.     Follow-up plan: ICM clinic phone appointment on 07/11/2022 to recheck fluid levels.   91 day device clinic remote transmission 08/28/2022.      EP/Cardiology Office Visits:  Next 4 month HF clinic OV due 10/17/2022 (no recall).  Recall 01/29/2023 with Tommye Standard, PA.   Copy of ICM check sent to Dr. Lovena Le  and Allena Katz, NP.   3 month ICM trend: 07/03/2022.    12-14 Month ICM trend:     Rosalene Billings, RN 07/04/2022 12:51 PM

## 2022-07-04 NOTE — Progress Notes (Signed)
Received: Today [place], Maricela Bo, FNP  Dorothey Oetken Panda, RN Please increase lasix to 80 mg daily and add 20 KCL daily. Please repeat BMET in 10-14 days.  Thank you Margarita Grizzle

## 2022-07-04 NOTE — Progress Notes (Signed)
Spoke with patient and advised Miguel Roach recommended he increase Furosemide 40 mg to 2 tablets (80 mg total) by mouth daily.  Also adding new prescription of Potassium 20 mEq take 1 tablet  daily.  Needs labs, BMET in 10-14 days and scheduled at HF clinic for 10/4.   Pt and wife repeated instructions back correctly.  Confirmed preferred pharmacy.  Pt had questions regarding taking all the meds at one time and BP dropping low.  Advised to call HF clinic with BP readings and provide information if the meds are causing side effects of dizziness, lightheadness or feeling faint.    Advised if he has any concerns or questions to call back.

## 2022-07-11 ENCOUNTER — Ambulatory Visit (INDEPENDENT_AMBULATORY_CARE_PROVIDER_SITE_OTHER): Payer: Medicare PPO

## 2022-07-11 DIAGNOSIS — I5022 Chronic systolic (congestive) heart failure: Secondary | ICD-10-CM

## 2022-07-11 DIAGNOSIS — Z95810 Presence of automatic (implantable) cardiac defibrillator: Secondary | ICD-10-CM

## 2022-07-12 NOTE — Progress Notes (Unsigned)
EPIC Encounter for ICM Monitoring  Patient Name: Miguel Roach is a 81 y.o. male Date: 07/12/2022 Primary Care Physican: Deland Pretty, MD Primary Cardiologist: Aundra Dubin Electrophysiologist: Lovena Le Nephrologist: [place] (every 3 months) 02/06/2022 Weight: 163 lbs 04/19/2022 Weight: 158-159 lbs 07/04/2022 Weight: 161.2 lbs (baseline 158-162 lbs)     Spoke with patient and heart failure questions reviewed.  He reports feeling slightly lightheaded in the last week after taking morning meds and BP ranges 86/59 - 104/67.  Furosemide dosage increased 9/19 from 40 mg to 80 mg.  He reports BP has remained low even after stopping Hydralazine and Imdur 9/19 (see my chart message on 9/19 to Dr Aundra Dubin).       Optivol thoracic impedance suggesting fluid levels returned to normal after starting Furosemide 80 mg daily on 9/19.  Lasix was stopped during 8/27 hospitalization, restarted at 40 mg 9/1 HF clinic OV and then increased 9/19 to 80 mg.    Prescribed:  Furosemide 40 mg take 2 tablets (80 mg total) daily Potassium 20 mEq take 1 tablet by mouth daily   Labs: 06/27/2022 Creatinine 3.69, BUN 31, Potassium 4.3, Sodium 142, GFR 16 06/16/2022 Creatinine 3.48, BUN 34, Potassium 4.2, Sodium 144, GFR 17  06/13/2022 Creatinine 3.78, BUN 51, Potassium 3.8, Sodium 142, GFR 15  02/13/2022 Creatinine 4.49, BUN 42, Potassium 4.1, Sodium 142, GFR 13 07/05/2021 Creatinine 4.30, BUN 53, Potassium 3.5, Sodium 143, GFR 13 03/01/2021 Creatinine 4.59, BUN 52, Potassium 3.7, Sodium 143, GFR 12 12/27/2020 Creatinine 4.43, BUN 49, Potassium 3.4, Sodium 140, GFR 13 10/26/2020 Creatinine 4.39, BUN 57, Potassium 3.5, Sodium 146, GFR 13 A complete set of results can be found in Results Review.     Recommendations:   Recommended patient split Furosemide dosage and take 40 mg early morning and 40 mg around noon to see if lightheadedness improves.  Advised will send to Dr Aundra Dubin for review and if he has further  recommendations.    Pt left HF clinic a phone message this AM regarding the same information about low BP and dizziness.   Follow-up plan: ICM clinic phone appointment on 07/18/2022 to check stability of fluid levels following splitting Furosemide dosage.   91 day device clinic remote transmission 08/28/2022.      EP/Cardiology Office Visits:  Next 4 month HF clinic OV due 10/17/2022 (no recall).  Recall 01/29/2023 with Tommye Standard, PA.   Copy of ICM check sent to Dr. Lovena Le.   3 month ICM trend: 07/11/2022.    12-14 Month ICM trend:     Rosalene Billings, RN 07/12/2022 12:56 PM

## 2022-07-18 ENCOUNTER — Ambulatory Visit (INDEPENDENT_AMBULATORY_CARE_PROVIDER_SITE_OTHER): Payer: Medicare PPO

## 2022-07-18 DIAGNOSIS — Z95810 Presence of automatic (implantable) cardiac defibrillator: Secondary | ICD-10-CM

## 2022-07-18 DIAGNOSIS — I5022 Chronic systolic (congestive) heart failure: Secondary | ICD-10-CM

## 2022-07-18 NOTE — Progress Notes (Signed)
EPIC Encounter for ICM Monitoring  Patient Name: Miguel Roach is a 81 y.o. male Date: 07/18/2022 Primary Care Physican: Deland Pretty, MD Primary Cardiologist: Aundra Dubin Electrophysiologist: Lovena Le Nephrologist: [place] (every 3 months) 02/06/2022 Weight: 163 lbs 04/19/2022 Weight: 158-159 lbs 07/04/2022 Weight: 161.2 lbs (baseline 158-162 lbs)     Spoke with patient and heart failure questions reviewed.  He reports he is feeling better and denies any dizziness or lightheadedness after taking Furosemide 80 mg in the morning.  He did not split Lasix into 2 dosage as we previously spoke about.    Optivol thoracic impedance suggesting fluid levels returned to normal.    Prescribed:  Furosemide 40 mg take 2 tablets (80 mg total) daily Potassium 20 mEq take 1 tablet by mouth daily   Labs: 06/27/2022 Creatinine 3.69, BUN 31, Potassium 4.3, Sodium 142, GFR 16 06/16/2022 Creatinine 3.48, BUN 34, Potassium 4.2, Sodium 144, GFR 17  06/13/2022 Creatinine 3.78, BUN 51, Potassium 3.8, Sodium 142, GFR 15  02/13/2022 Creatinine 4.49, BUN 42, Potassium 4.1, Sodium 142, GFR 13 07/05/2021 Creatinine 4.30, BUN 53, Potassium 3.5, Sodium 143, GFR 13 03/01/2021 Creatinine 4.59, BUN 52, Potassium 3.7, Sodium 143, GFR 12 12/27/2020 Creatinine 4.43, BUN 49, Potassium 3.4, Sodium 140, GFR 13 10/26/2020 Creatinine 4.39, BUN 57, Potassium 3.5, Sodium 146, GFR 13 A complete set of results can be found in Results Review.     Recommendations: No changes and encouraged to call if experiencing any fluid symptoms.   Follow-up plan: ICM clinic phone appointment on 08/07/2022.   91 day device clinic remote transmission 08/28/2022.      EP/Cardiology Office Visits:  Next 4 month HF clinic OV due 10/17/2022 (no recall).  Recall 01/29/2023 with Tommye Standard, PA.   Copy of ICM check sent to Dr. Lovena Le.   3 month ICM trend: 07/18/2022.    12-14 Month ICM trend:     Rosalene Billings, RN 07/18/2022 3:12  PM

## 2022-07-19 ENCOUNTER — Ambulatory Visit (HOSPITAL_COMMUNITY)
Admission: RE | Admit: 2022-07-19 | Discharge: 2022-07-19 | Disposition: A | Discharge status: Home / Self Care | Payer: Medicare PPO | Source: Ambulatory Visit | Attending: Cardiology | Admitting: Cardiology

## 2022-07-19 ENCOUNTER — Telehealth (HOSPITAL_COMMUNITY): Payer: Self-pay

## 2022-07-19 DIAGNOSIS — I5022 Chronic systolic (congestive) heart failure: Secondary | ICD-10-CM

## 2022-07-19 LAB — BASIC METABOLIC PANEL
Anion gap: 7 (ref 5–15)
BUN: 50 mg/dL — ABNORMAL HIGH (ref 8–23)
CO2: 24 mmol/L (ref 22–32)
Calcium: 8.9 mg/dL (ref 8.9–10.3)
Chloride: 113 mmol/L — ABNORMAL HIGH (ref 98–111)
Creatinine, Ser: 4.36 mg/dL — ABNORMAL HIGH (ref 0.61–1.24)
GFR, Estimated: 13 mL/min — ABNORMAL LOW (ref >60)
Glucose, Bld: 104 mg/dL — ABNORMAL HIGH (ref 70–99)
Potassium: 4.5 mmol/L (ref 3.5–5.1)
Sodium: 144 mmol/L (ref 135–145)

## 2022-07-19 NOTE — Telephone Encounter (Signed)
Patient's lab has been ordered and his appointment scheduled.  Pt aware, agreeable, and verbalized understanding

## 2022-07-19 NOTE — Telephone Encounter (Signed)
-----   Message from Rafael Bihari, [place] sent at 07/19/2022  2:36 PM EDT ----- OptiVol has returned to normal fluid levels after restarting Lasix.  Kidney function is worse.  Please repeat BMET in 10-14 days to follow

## 2022-07-31 ENCOUNTER — Telehealth (HOSPITAL_COMMUNITY): Payer: Self-pay | Admitting: Surgery

## 2022-07-31 ENCOUNTER — Ambulatory Visit (HOSPITAL_COMMUNITY)
Admission: RE | Admit: 2022-07-31 | Discharge: 2022-07-31 | Disposition: A | Discharge status: Home / Self Care | Payer: Medicare PPO | Source: Ambulatory Visit | Attending: Internal Medicine | Admitting: Internal Medicine

## 2022-07-31 DIAGNOSIS — I5022 Chronic systolic (congestive) heart failure: Secondary | ICD-10-CM | POA: Insufficient documentation

## 2022-07-31 LAB — BASIC METABOLIC PANEL
Anion gap: 5 (ref 5–15)
BUN: 48 mg/dL — ABNORMAL HIGH (ref 8–23)
CO2: 25 mmol/L (ref 22–32)
Calcium: 8.8 mg/dL — ABNORMAL LOW (ref 8.9–10.3)
Chloride: 113 mmol/L — ABNORMAL HIGH (ref 98–111)
Creatinine, Ser: 4.71 mg/dL — ABNORMAL HIGH (ref 0.61–1.24)
GFR, Estimated: 12 mL/min — ABNORMAL LOW (ref >60)
Glucose, Bld: 87 mg/dL (ref 70–99)
Potassium: 4.5 mmol/L (ref 3.5–5.1)
Sodium: 143 mmol/L (ref 135–145)

## 2022-07-31 MED ORDER — FUROSEMIDE 40 MG PO TABS: ORAL_TABLET | ORAL | 11 refills | Status: DC

## 2022-07-31 NOTE — Telephone Encounter (Signed)
-----   Message from Rafael Bihari, [place] sent at 07/31/2022  2:04 PM EDT ----- Kidney function continues to decline. Device transmission suggests fluid levels are now back to normal.   Decrease Lasix to 80 mg daily alternating with 40 mg every other day. Please make sure he has follow up with Dr. Posey Pronto (Nephrology).

## 2022-07-31 NOTE — Telephone Encounter (Signed)
I called patient to review results and recommendations per provider.  He is aware and agreeable.  I have updated [facility] medlist.  He will contact his nephrologist to schedule an appt.

## 2022-08-07 ENCOUNTER — Ambulatory Visit (INDEPENDENT_AMBULATORY_CARE_PROVIDER_SITE_OTHER): Payer: Medicare PPO

## 2022-08-07 DIAGNOSIS — Z95810 Presence of automatic (implantable) cardiac defibrillator: Secondary | ICD-10-CM | POA: Diagnosis not present

## 2022-08-07 DIAGNOSIS — I5022 Chronic systolic (congestive) heart failure: Secondary | ICD-10-CM

## 2022-08-08 NOTE — Progress Notes (Signed)
EPIC Encounter for ICM Monitoring  Patient Name: Miguel Roach is a 81 y.o. male Date: 08/08/2022 Primary Care Physican: Deland Pretty, MD Primary Cardiologist: Aundra Dubin Electrophysiologist: Lovena Le Nephrologist: [place] (every 3 months) 02/06/2022 Weight: 163 lbs 04/19/2022 Weight: 158-159 lbs 07/04/2022 Weight: 161.2 lbs (baseline 158-162 lbs)     Spoke with patient and heart failure questions reviewed.  Transmission results reviewed.  Pt asymptomatic for fluid accumulation.  Reports feeling well at this time and voices no complaints.     Optivol thoracic impedance suggesting normal fluid levels.    Prescribed:  Furosemide 40 mg Take 2 tablets (80 mg total) by mouth every other day AND 1 tablet (40 mg total) every other day. Alternate 80 mg with 40 mg every other day. Potassium 20 mEq take 1 tablet by mouth daily   Labs: 07/31/2022 Creatinine 4.71, BUN 48, Potassium 4.5, Sodium 143, GFR 12 07/19/2022 Creatinine 4.36, BUN 50, Potassium 4.5, Sodium 144, GFR 13  06/27/2022 Creatinine 3.69, BUN 31, Potassium 4.3, Sodium 142, GFR 16 06/16/2022 Creatinine 3.48, BUN 34, Potassium 4.2, Sodium 144, GFR 17  A complete set of results can be found in Results Review.     Recommendations: No changes and encouraged to call if experiencing any fluid symptoms.   Follow-up plan: ICM clinic phone appointment on 09/11/2022.   91 day device clinic remote transmission 08/28/2022.      EP/Cardiology Office Visits:  Next 4 month HF clinic OV due 10/17/2022 (no recall).  Recall 01/29/2023 with Tommye Standard, PA.   Copy of ICM check sent to Dr. Lovena Le.   3 month ICM trend: 08/07/2022.    12-14 Month ICM trend:     Rosalene Billings, RN 08/08/2022 8:14 AM

## 2022-08-17 DIAGNOSIS — I119 Hypertensive heart disease without heart failure: Secondary | ICD-10-CM | POA: Diagnosis not present

## 2022-08-17 DIAGNOSIS — D631 Anemia in chronic kidney disease: Secondary | ICD-10-CM | POA: Diagnosis not present

## 2022-08-17 DIAGNOSIS — N2581 Secondary hyperparathyroidism of renal origin: Secondary | ICD-10-CM | POA: Diagnosis not present

## 2022-08-17 DIAGNOSIS — N185 Chronic kidney disease, stage 5: Secondary | ICD-10-CM | POA: Diagnosis not present

## 2022-08-17 DIAGNOSIS — I43 Cardiomyopathy in diseases classified elsewhere: Secondary | ICD-10-CM | POA: Diagnosis not present

## 2022-08-17 DIAGNOSIS — N189 Chronic kidney disease, unspecified: Secondary | ICD-10-CM | POA: Diagnosis not present

## 2022-08-17 DIAGNOSIS — I130 Hypertensive heart and chronic kidney disease with heart failure and stage 1 through stage 4 chronic kidney disease, or unspecified chronic kidney disease: Secondary | ICD-10-CM | POA: Diagnosis not present

## 2022-08-28 ENCOUNTER — Ambulatory Visit (INDEPENDENT_AMBULATORY_CARE_PROVIDER_SITE_OTHER): Payer: Medicare PPO

## 2022-08-28 DIAGNOSIS — I5022 Chronic systolic (congestive) heart failure: Secondary | ICD-10-CM | POA: Diagnosis not present

## 2022-08-29 LAB — CUP PACEART REMOTE DEVICE CHECK
Battery Remaining Longevity: 61 mo
Battery Voltage: 2.99 V
Brady Statistic RV Percent Paced: 0.24 %
Date Time Interrogation Session: 20231113033624
HighPow Impedance: 62 Ohm
Implantable Lead Connection Status: 753985
Implantable Lead Implant Date: 20170208
Implantable Lead Location: 753860
Implantable Lead Model: 6935
Implantable Pulse Generator Implant Date: 20170208
Lead Channel Impedance Value: 285 Ohm
Lead Channel Impedance Value: 361 Ohm
Lead Channel Pacing Threshold Amplitude: 1 V
Lead Channel Pacing Threshold Pulse Width: 0.4 ms
Lead Channel Sensing Intrinsic Amplitude: 14.375 mV
Lead Channel Sensing Intrinsic Amplitude: 14.375 mV
Lead Channel Setting Pacing Amplitude: 2 V
Lead Channel Setting Pacing Pulse Width: 0.4 ms
Lead Channel Setting Sensing Sensitivity: 0.3 mV
Zone Setting Status: 755011
Zone Setting Status: 755011

## 2022-09-11 ENCOUNTER — Ambulatory Visit (INDEPENDENT_AMBULATORY_CARE_PROVIDER_SITE_OTHER): Payer: Medicare PPO

## 2022-09-11 DIAGNOSIS — I5022 Chronic systolic (congestive) heart failure: Secondary | ICD-10-CM | POA: Diagnosis not present

## 2022-09-11 DIAGNOSIS — Z95810 Presence of automatic (implantable) cardiac defibrillator: Secondary | ICD-10-CM

## 2022-09-11 NOTE — Progress Notes (Unsigned)
EPIC Encounter for ICM Monitoring  Patient Name: Miguel Roach is a 81 y.o. male Date: 09/11/2022 Primary Care Physican: Deland Pretty, MD Primary Cardiologist: Aundra Dubin Electrophysiologist: Lovena Le Nephrologist: [place] (every 3 months) 02/06/2022 Weight: 163 lbs 04/19/2022 Weight: 158-159 lbs 07/04/2022 Weight: 161.2 lbs (baseline 158-162 lbs) 09/11/2022 Weight:158-159 lbs      Spoke with patient and heart failure questions reviewed.  Transmission results reviewed.  Pt asymptomatic for fluid accumulation.  Reports feeling well at this time and voices no complaints.     Dr Posey Pronto changed Furosemide dosage to 40 mg every other day and Potassium 20 mEq every other day at the visit on 11/2 due to higher creatinine (no copy of latest labs)    Optivol thoracic impedance suggesting possible fluid accumulation starting 10/23.    Prescribed:  Furosemide 40 mg Take 2 tablets (80 mg total) by mouth every other day AND 1 tablet (40 mg total) every other day. Alternate 80 mg with 40 mg every other day. Potassium 20 mEq take 1 tablet by mouth daily   Labs: 07/31/2022 Creatinine 4.71, BUN 48, Potassium 4.5, Sodium 143, GFR 12 07/19/2022 Creatinine 4.36, BUN 50, Potassium 4.5, Sodium 144, GFR 13  06/27/2022 Creatinine 3.69, BUN 31, Potassium 4.3, Sodium 142, GFR 16 06/16/2022 Creatinine 3.48, BUN 34, Potassium 4.2, Sodium 144, GFR 17  A complete set of results can be found in Results Review.     Recommendations:  Advised patient to contact Dr Posey Pronto (nephrologist) office tomorrow for recommendations regarding possible fluid accumulation.  If he does not receive recommendations will send to Dr Aundra Dubin for review.   Pt will call back with Dr Serita Grit recommendations.   Follow-up plan: ICM clinic phone appointment on 09/19/2022 to recheck fluid levels.   91 day device clinic remote transmission 11/27/2022.      EP/Cardiology Office Visits:  Next 4 month HF clinic OV due 10/17/2022 (no recall).   Recall 01/29/2023 with Tommye Standard, PA.   Next visit with Dr Posey Pronto in February.    Copy of ICM check sent to Dr. Lovena Le.    3 month ICM trend: 09/11/2022.    12-14 Month ICM trend:     Rosalene Billings, RN 09/11/2022 10:39 AM

## 2022-09-13 NOTE — Progress Notes (Signed)
Received voice mail message from patient stating Dr Posey Pronto (nephrologist) changed the Furosemide dosage to 40 mg daily.  Next nephrologist appointment is in Feb.

## 2022-09-18 DIAGNOSIS — N185 Chronic kidney disease, stage 5: Secondary | ICD-10-CM | POA: Diagnosis not present

## 2022-09-18 DIAGNOSIS — I10 Essential (primary) hypertension: Secondary | ICD-10-CM | POA: Diagnosis not present

## 2022-09-19 ENCOUNTER — Ambulatory Visit (INDEPENDENT_AMBULATORY_CARE_PROVIDER_SITE_OTHER): Payer: Medicare PPO

## 2022-09-19 DIAGNOSIS — I5022 Chronic systolic (congestive) heart failure: Secondary | ICD-10-CM

## 2022-09-19 DIAGNOSIS — Z95810 Presence of automatic (implantable) cardiac defibrillator: Secondary | ICD-10-CM

## 2022-09-19 NOTE — Progress Notes (Signed)
EPIC Encounter for ICM Monitoring  Patient Name: Miguel Roach is a 81 y.o. male Date: 09/19/2022 Primary Care Physican: Deland Pretty, MD Primary Cardiologist: Aundra Dubin Electrophysiologist: Lovena Le Nephrologist: [place] (every 3 months) 02/06/2022 Weight: 163 lbs 04/19/2022 Weight: 158-159 lbs 07/04/2022 Weight: 161.2 lbs (baseline 158-162 lbs) 09/11/2022 Weight:158-159 lbs      Spoke with patient and heart failure questions reviewed.  Transmission results reviewed.  Pt asymptomatic for fluid accumulation.  Reports feeling well at this time and voices no complaints.      Dr Posey Pronto changed Furosemide dosage to 40 mg daily on 09/10/2022    Optivol thoracic impedance suggesting fluid levels returned to baseline.    Prescribed:  Furosemide 40 mg Take 2 tablets (80 mg total) by mouth every other day AND 1 tablet (40 mg total) every other day. Alternate 80 mg with 40 mg every other day.  Pt reports taking Lasix 40 mg daily as ordered by Dr Posey Pronto on 11/26. Potassium 20 mEq take 1 tablet by mouth daily   Labs: 07/31/2022 Creatinine 4.71, BUN 48, Potassium 4.5, Sodium 143, GFR 12 07/19/2022 Creatinine 4.36, BUN 50, Potassium 4.5, Sodium 144, GFR 13  06/27/2022 Creatinine 3.69, BUN 31, Potassium 4.3, Sodium 142, GFR 16 06/16/2022 Creatinine 3.48, BUN 34, Potassium 4.2, Sodium 144, GFR 17  A complete set of results can be found in Results Review.     Recommendations:  No changes and encouraged to call if experiencing any fluid symptoms.   Follow-up plan: ICM clinic phone appointment on 10/30/2022.   91 day device clinic remote transmission 11/27/2022.      EP/Cardiology Office Visits:  Next 4 month HF clinic OV due 10/17/2022 (no recall).  Recall 01/29/2023 with Tommye Standard, PA.   Next visit with Dr Posey Pronto in February.    Copy of ICM check sent to Dr. Lovena Le.    3 month ICM trend: 09/19/2022.    12-14 Month ICM trend:     Rosalene Billings, RN 09/19/2022 4:49 PM

## 2022-09-21 DIAGNOSIS — Z0000 Encounter for general adult medical examination without abnormal findings: Secondary | ICD-10-CM | POA: Diagnosis not present

## 2022-09-21 DIAGNOSIS — N186 End stage renal disease: Secondary | ICD-10-CM | POA: Diagnosis not present

## 2022-09-21 DIAGNOSIS — I5022 Chronic systolic (congestive) heart failure: Secondary | ICD-10-CM | POA: Diagnosis not present

## 2022-09-21 DIAGNOSIS — I119 Hypertensive heart disease without heart failure: Secondary | ICD-10-CM | POA: Diagnosis not present

## 2022-09-21 DIAGNOSIS — I480 Paroxysmal atrial fibrillation: Secondary | ICD-10-CM | POA: Diagnosis not present

## 2022-09-21 DIAGNOSIS — N2581 Secondary hyperparathyroidism of renal origin: Secondary | ICD-10-CM | POA: Diagnosis not present

## 2022-09-21 DIAGNOSIS — H6123 Impacted cerumen, bilateral: Secondary | ICD-10-CM | POA: Diagnosis not present

## 2022-09-21 DIAGNOSIS — M1A00X0 Idiopathic chronic gout, unspecified site, without tophus (tophi): Secondary | ICD-10-CM | POA: Diagnosis not present

## 2022-09-21 DIAGNOSIS — D631 Anemia in chronic kidney disease: Secondary | ICD-10-CM | POA: Diagnosis not present

## 2022-09-29 ENCOUNTER — Other Ambulatory Visit (HOSPITAL_COMMUNITY): Payer: Self-pay | Admitting: Cardiology

## 2022-10-11 NOTE — Progress Notes (Signed)
Remote ICD transmission.   

## 2022-10-27 ENCOUNTER — Other Ambulatory Visit (HOSPITAL_COMMUNITY): Payer: Self-pay | Admitting: Cardiology

## 2022-10-30 ENCOUNTER — Ambulatory Visit (INDEPENDENT_AMBULATORY_CARE_PROVIDER_SITE_OTHER): Payer: Medicare PPO

## 2022-10-30 DIAGNOSIS — Z95810 Presence of automatic (implantable) cardiac defibrillator: Secondary | ICD-10-CM

## 2022-10-30 DIAGNOSIS — I5022 Chronic systolic (congestive) heart failure: Secondary | ICD-10-CM

## 2022-10-31 ENCOUNTER — Telehealth: Payer: Self-pay

## 2022-10-31 ENCOUNTER — Other Ambulatory Visit (HOSPITAL_COMMUNITY): Payer: Self-pay

## 2022-10-31 MED ORDER — ALLOPURINOL 100 MG PO TABS: 100.0000 mg | ORAL_TABLET | Freq: Every day | ORAL | 2 refills | Status: DC

## 2022-10-31 NOTE — Progress Notes (Signed)
EPIC Encounter for ICM Monitoring  Patient Name: Miguel Roach is a 82 y.o. male Date: 10/31/2022 Primary Care Physican: Deland Pretty, MD Primary Cardiologist: Aundra Dubin Electrophysiologist: Lovena Le Nephrologist: [place] (every 3 months) 02/06/2022 Weight: 163 lbs 04/19/2022 Weight: 158-159 lbs 07/04/2022 Weight: 161.2 lbs (baseline 158-162 lbs) 09/11/2022 Weight:158-159 lbs  10/31/2022 Weight: 155-160 lbs     Spoke with patient and heart failure questions reviewed.  Transmission results reviewed.  Pt reports he does not have any fluid symptoms at this time.     Dr Posey Pronto changed Furosemide dosage to 40 mg daily on 09/10/2022    Optivol thoracic impedance suggesting possible fluid accumulation starting 1/7 and returned to baseline 1/16.    Prescribed:  Furosemide 40 mg Take 2 tablets (80 mg total) by mouth every other day AND 1 tablet (40 mg total) every other day. Alternate 80 mg with 40 mg every other day.  Pt reports taking Lasix 40 mg daily as ordered by Dr Posey Pronto on 11/26. Potassium 20 mEq take 1 tablet by mouth daily   Labs: 07/31/2022 Creatinine 4.71, BUN 48, Potassium 4.5, Sodium 143, GFR 12 07/19/2022 Creatinine 4.36, BUN 50, Potassium 4.5, Sodium 144, GFR 13  06/27/2022 Creatinine 3.69, BUN 31, Potassium 4.3, Sodium 142, GFR 16 06/16/2022 Creatinine 3.48, BUN 34, Potassium 4.2, Sodium 144, GFR 17  A complete set of results can be found in Results Review.     Recommendations:  No changes and encouraged to call if experiencing any fluid symptoms.   Follow-up plan: ICM clinic phone appointment on 12/05/2022.   91 day device clinic remote transmission 11/27/2022.      EP/Cardiology Office Visits:  Advised to call for next 4 month HF clinic OV due 10/17/2022 (no recall).  Recall 01/29/2023 with Tommye Standard, PA.   Next visit with Dr Posey Pronto in February.    Copy of ICM check sent to Dr. Lovena Le.    3 month ICM trend: 10/31/2022.    12-14 Month ICM trend:     Rosalene Billings, RN 10/31/2022 2:49 PM

## 2022-10-31 NOTE — Telephone Encounter (Signed)
ICM call to patient.  During call he reports he has 3 days left of Allopurinol 100 mg tablets and requesting a refill be sent to [facility] in [place].  He stated the [facility] pharmacy told him to call for a refill since it will need to be approved by Dr Aundra Dubin.

## 2022-11-09 DIAGNOSIS — Z23 Encounter for immunization: Secondary | ICD-10-CM | POA: Diagnosis not present

## 2022-11-27 ENCOUNTER — Ambulatory Visit: Payer: Medicare PPO

## 2022-11-27 DIAGNOSIS — I5022 Chronic systolic (congestive) heart failure: Secondary | ICD-10-CM | POA: Diagnosis not present

## 2022-11-28 DIAGNOSIS — N185 Chronic kidney disease, stage 5: Secondary | ICD-10-CM | POA: Diagnosis not present

## 2022-11-28 LAB — CUP PACEART REMOTE DEVICE CHECK
Battery Remaining Longevity: 57 mo
Battery Voltage: 2.99 V
Brady Statistic RV Percent Paced: 0.13 %
Date Time Interrogation Session: 20240212033424
HighPow Impedance: 57 Ohm
Implantable Lead Connection Status: 753985
Implantable Lead Implant Date: 20170208
Implantable Lead Location: 753860
Implantable Lead Model: 6935
Implantable Pulse Generator Implant Date: 20170208
Lead Channel Impedance Value: 304 Ohm
Lead Channel Impedance Value: 361 Ohm
Lead Channel Pacing Threshold Amplitude: 1 V
Lead Channel Pacing Threshold Pulse Width: 0.4 ms
Lead Channel Sensing Intrinsic Amplitude: 13.625 mV
Lead Channel Sensing Intrinsic Amplitude: 13.625 mV
Lead Channel Setting Pacing Amplitude: 2 V
Lead Channel Setting Pacing Pulse Width: 0.4 ms
Lead Channel Setting Sensing Sensitivity: 0.3 mV
Zone Setting Status: 755011
Zone Setting Status: 755011

## 2022-12-05 ENCOUNTER — Ambulatory Visit: Payer: Medicare PPO

## 2022-12-05 DIAGNOSIS — I5022 Chronic systolic (congestive) heart failure: Secondary | ICD-10-CM

## 2022-12-05 DIAGNOSIS — Z95810 Presence of automatic (implantable) cardiac defibrillator: Secondary | ICD-10-CM | POA: Diagnosis not present

## 2022-12-06 DIAGNOSIS — N185 Chronic kidney disease, stage 5: Secondary | ICD-10-CM | POA: Diagnosis not present

## 2022-12-06 DIAGNOSIS — D631 Anemia in chronic kidney disease: Secondary | ICD-10-CM | POA: Diagnosis not present

## 2022-12-06 DIAGNOSIS — N2581 Secondary hyperparathyroidism of renal origin: Secondary | ICD-10-CM | POA: Diagnosis not present

## 2022-12-06 DIAGNOSIS — I130 Hypertensive heart and chronic kidney disease with heart failure and stage 1 through stage 4 chronic kidney disease, or unspecified chronic kidney disease: Secondary | ICD-10-CM | POA: Diagnosis not present

## 2022-12-06 NOTE — Progress Notes (Signed)
EPIC Encounter for ICM Monitoring  Patient Name: Miguel Roach is a 82 y.o. male Date: 12/06/2022 Primary Care Physican: Deland Pretty, MD Primary Cardiologist: Aundra Dubin Electrophysiologist: Lovena Le Nephrologist: [place] (every 3 months) 02/06/2022 Weight: 163 lbs 04/19/2022 Weight: 158-159 lbs 07/04/2022 Weight: 161.2 lbs (baseline 158-162 lbs) 09/11/2022 Weight:158-159 lbs  10/31/2022 Weight: 155-160 lbs     Spoke with patient and heart failure questions reviewed.  Transmission results reviewed.  Pt reports he does not have any fluid symptoms at this time.  He received a good report from nephrologist and Creatinine levels have improved.    He reports Dr Posey Pronto advised to continue to take Furosemide 40 mg daily     Optivol thoracic impedance normal but was suggesting intermittent days with possible fluid accumulation.      Prescribed:  Furosemide 40 mg Take 2 tablets (80 mg total) by mouth every other day AND 1 tablet (40 mg total) every other day. Alternate 80 mg with 40 mg every other day.  Pt reports taking Lasix 40 mg daily as ordered by Dr Posey Pronto on 11/26. Potassium 20 mEq take 1 tablet by mouth daily   Labs: 07/31/2022 Creatinine 4.71, BUN 48, Potassium 4.5, Sodium 143, GFR 12 07/19/2022 Creatinine 4.36, BUN 50, Potassium 4.5, Sodium 144, GFR 13  06/27/2022 Creatinine 3.69, BUN 31, Potassium 4.3, Sodium 142, GFR 16 06/16/2022 Creatinine 3.48, BUN 34, Potassium 4.2, Sodium 144, GFR 17  A complete set of results can be found in Results Review.     Recommendations:  No changes and encouraged to call if experiencing any fluid symptoms.   Follow-up plan: ICM clinic phone appointment on 01/08/2023.   91 day device clinic remote transmission 02/26/2023.      EP/Cardiology Office Visits:  Advised to call for next 4 month HF clinic OV due 10/17/2022 (no recall).  Recall 01/29/2023 with Tommye Standard, PA.   Next visit with Dr Posey Pronto in February.    Copy of ICM check sent to Dr.  Lovena Le.    3 month ICM trend: 12/05/2022.    12-14 Month ICM trend:     Rosalene Billings, RN 12/06/2022 3:37 PM

## 2022-12-11 ENCOUNTER — Encounter (HOSPITAL_COMMUNITY): Payer: Self-pay

## 2022-12-11 ENCOUNTER — Ambulatory Visit (HOSPITAL_COMMUNITY)
Admission: RE | Admit: 2022-12-11 | Discharge: 2022-12-11 | Disposition: A | Discharge status: Home / Self Care | Payer: Medicare PPO | Source: Ambulatory Visit | Attending: Family Medicine | Admitting: Family Medicine

## 2022-12-11 VITALS — BP 118/64 | HR 64 | Wt 161.0 lb

## 2022-12-11 DIAGNOSIS — I480 Paroxysmal atrial fibrillation: Secondary | ICD-10-CM | POA: Diagnosis not present

## 2022-12-11 DIAGNOSIS — I428 Other cardiomyopathies: Secondary | ICD-10-CM | POA: Insufficient documentation

## 2022-12-11 DIAGNOSIS — Z79899 Other long term (current) drug therapy: Secondary | ICD-10-CM | POA: Insufficient documentation

## 2022-12-11 DIAGNOSIS — G4733 Obstructive sleep apnea (adult) (pediatric): Secondary | ICD-10-CM | POA: Insufficient documentation

## 2022-12-11 DIAGNOSIS — M109 Gout, unspecified: Secondary | ICD-10-CM | POA: Insufficient documentation

## 2022-12-11 DIAGNOSIS — I472 Ventricular tachycardia, unspecified: Secondary | ICD-10-CM | POA: Insufficient documentation

## 2022-12-11 DIAGNOSIS — N184 Chronic kidney disease, stage 4 (severe): Secondary | ICD-10-CM | POA: Diagnosis not present

## 2022-12-11 DIAGNOSIS — I493 Ventricular premature depolarization: Secondary | ICD-10-CM | POA: Diagnosis not present

## 2022-12-11 DIAGNOSIS — I2510 Atherosclerotic heart disease of native coronary artery without angina pectoris: Secondary | ICD-10-CM | POA: Insufficient documentation

## 2022-12-11 DIAGNOSIS — I5023 Acute on chronic systolic (congestive) heart failure: Secondary | ICD-10-CM | POA: Diagnosis not present

## 2022-12-11 DIAGNOSIS — I5022 Chronic systolic (congestive) heart failure: Secondary | ICD-10-CM

## 2022-12-11 DIAGNOSIS — Z95810 Presence of automatic (implantable) cardiac defibrillator: Secondary | ICD-10-CM | POA: Insufficient documentation

## 2022-12-11 NOTE — Progress Notes (Signed)
ID:  Miguel Roach, Miguel Roach 1941-10-13, MRN [id number]   Provider location: [facility] Type of Visit: Established patient   PCP:  Deland Pretty, MD  HF Cardiologist:  Dr. Aundra Dubin   HPI: Miguel Roach is a 82 y.o. male who has a history of chronic systolic CHF/nonischemic cardiomyopathy and CKD. He actually had an echo back in 2010 with EF 40-45% at the time.  He says that this really was not followed up upon by a cardiologist and that he did well for a number of years after that. In 6/16, he was admitted with acute systolic CHF.  Echo showed EF down to 20-25%.  Cardiac cath showed nonobstructive CAD.  His Lasix has been gradually increased.  This has brought his weight down and improved his breathing, but creatinine has risen. He had been on olmesartan but this was stopped because he thought it was causing cough.  He was then put on losartan, but this was stopped with rise in creatinine. Also intolerant to Bidil with orthostatic symptoms.  He was started on digoxin.  Echo in 12/16 showed persistently low EF, 15-20%.  Medtronic ICD was placed in 2/17.  Repeat echo 1/18 showed EF 25% with diffuse hypokinesis.   In 3/18, he had an episode of about 10 minutes atrial fibrillation noted on his ICD.  He also had 1 episode of VT terminated by an appropriate discharge.  He had push-mowed his grass and was very fatigued when he had the shock.  Since then, he has had no further arrhythmias.   Echo in 6/19 showed EF 25-30% with mild LV dilation and mild-moderate AI, normal RV size with mildly decreased systolic function, IVC normal.   Admitted 1/27 - 11/17/2018 with hypotension and found to have UTI/sepsis. Treated on ABX and IVF. Eventually transitioned back to po diuretics with HF team following. 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 Noted to have increased NSVT and PVCs, started on amiodarone.  Zio patch in 6/20 showed occasional PVCs, burden low.   Echo in 8/20 showed EF 35%, severe LV  dilation, normal RV size and systolic function, mild-moderate AI.   Echo in 2/22 showed EF 25-30%, severe LV dilation, septal-lateral dyssynchrony, mild LVH, low normal RV function, mild-moderate AI.   Admitted 8/23 w/ sepsis. Blood Cx showed e-coli and pseudomonas. Received IV abx. Echo showed EF 25-30%, RV low/normal. BP low and GDMT held. Discharged home off Lasix & hydralazine, weight 168 lbs.  Follow up 9/23, NYHA II-early III and volume up on OptiVol. Lasix 40 mg daily restarted.  Today he returns for HF follow up. Overall feeling fine. He walks the track at the [facility] a couple times a week for exercise and does not have dyspnea with this. Denies abnormal  bleeding, palpitations, CP, dizziness, edema, or PND/Orthopnea. Appetite ok. No fever or chills. Weight at home 161 pounds. Taking all medications.  ECG (personally reviewed): none ordered today.  Medtronic device interrogation (personally reviewed): OptiVol and thoracic impedence stable, 1 hr day/activity, no AF/VT  Labs (1/19): K 3.8, creatinine 3.15 Labs (2/19): K 4.0, creatinine 3.23 Labs (3/19): digoxin 0.4, K 3.8, creatinine 3.19 Labs (6/19): digoxin 0.3, K 3.9, creatinine 3.12 Labs (9/19): K 3.9, creatinine 3.58, digoxin 0.3, LDL 91 Labs (3/20): K 4, creatinine 3.71 Labs (6/20): K 3.9, creatinine 3.94, LFTs normal, TSH normal Labs (8/20): K 3.7, creatinine 4.08, LFTs normal, TSH normal Labs (1/22): K 3.5, creatinine 4.34, LFTs normal Labs (3/22): K 3.4, creatinine 4.43 Labs (5/22): K  3.7, creatinine 4.59, LFTs normal, TSH normal Labs (9/22): K 3.5, creatinine 4.3, TSH normal Labs (8/23): K 3.8, creatinine 3.78 Labs (11/23): K 4.6, creatinine 4.09 Labs (2/24): K 4.5, creatinine 3.89  PMH: 1. CKD: Stage IV.  Follows with Dr Posey Pronto.  2. Gout 3. Chronic systolic CHF: Nonischemic cardiomyopathy.  EF 40-45% in 2010.  Echo (6/16) with EF 20-25%, severe LV dilation, mild MR, mild AI, moderate LAE.  LHC/RHC (6/16) with  nonobstructive CAD; mean RA 2, RV 48/1, PA 47/18, mean PCWP 12, CI 3.2.  CPX (10/16) with peak VO2 13.1 (50% predicted), VE/VCO2 slope 39.3, RER 1.22 => moderate to severe functional limitation.  Echo (12/16) with EF 15-20%, severe LV dilation, diffuse hypokinesis, moderate AI, mild MR, normal RV size and systolic function. Medtronic ICD.  - CPX (11/17): Submaximal with RER 0.95, VE/VCO2 slope 27, peak VO2 18.2.  Suspect no more than mild HF limitation.  - Echo (1/18): EF 25%, diffuse hypokinesis, normal RV size and systolic function, mild AI.  - Echo (6/19): EF 25-30% with mild LV dilation and mild-moderate AI, normal RV size with mildly decreased systolic function, IVC normal.  - Echo (8/20): EF 35%, severe LV dilation, normal RV size and systolic function, mild-moderate AI.  - Echo (2/22): EF 25-30%, severe LV dilation, septal-lateral dyssynchrony, mild LVH, low normal RV function, mild-moderate AI.  - Echo (8/23): EF 25-30%, severe LV dysfunction, grade I DD, RV low/normal, mild AI, AAA 39 mm. 4. ACEI cough.  5. Pleural effusion: Thoracentesis on right in 8/16.  It was a transudate, likely due to CHF.  6. OSA: To start Bipap.  7. Esophageal duplication cyst.  8. High resolution CT chest 12/17: No evidence for interstitial lung disease.  9. Atrial fibrillation: Paroxysmal, 1 10 minute episode in 3/18.  10. VT: ICD discharge 3/18.  11. 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  - Zio patch in 6/20: occasional PVCs, low burden overall.  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   FH: Mother with "weak heart." Brother with sickle cell anemia.   ROS: All systems reviewed and negative except as per HPI.   Current Outpatient Medications  Medication Sig Dispense Refill   allopurinol (ZYLOPRIM) 100 MG tablet Take 1 tablet (100 mg total) by mouth daily. 30 tablet 2   amiodarone (PACERONE) 100 MG tablet TAKE 1 TABLET BY MOUTH EVERY DAY 90 tablet 3   carvedilol (COREG) 6.25 MG tablet TAKE  1.5 TABLETS BY MOUTH 2 TIMES DAILY WITH A MEAL. 270 tablet 2   Fe Fum-Fe Poly-Vit C-Lactobac (FUSION) 65-65-25-30 MG CAPS Take 1 tablet by mouth daily.     furosemide (LASIX) 40 MG tablet Take 2 tablets (80 mg total) by mouth every other day AND 1 tablet (40 mg total) every other day. Alternate 80 mg with 40 mg every other day.. (Patient taking differently: 1 per day) 45 tablet 11   potassium chloride SA (KLOR-CON M20) 20 MEQ tablet Take 1 tablet (20 mEq total) by mouth daily. 30 tablet 11   No current facility-administered medications for this encounter.   BP 118/64   Pulse 64   Wt 73 kg (161 lb)   SpO2 100%   BMI 22.45 kg/m   Wt Readings from Last 3 Encounters:  12/11/22 73 kg (161 lb)  06/16/22 76.5 kg (168 lb 9.6 oz)  06/11/22 (P) 73 kg (160 lb 15 oz)   Physical Exam General:  NAD. No resp difficulty HEENT: Normal Neck: Supple. No JVD. Carotids 2+  bilat; no bruits. No lymphadenopathy or thryomegaly appreciated. Cor: PMI nondisplaced. Regular rate & rhythm. No rubs, gallops, 2/6 SEM RUSB Lungs: Clear Abdomen: Soft, nontender, nondistended. No hepatosplenomegaly. No bruits or masses. Good bowel sounds. Extremities: No cyanosis, clubbing, rash, edema Neuro: Alert & oriented x 3, cranial nerves grossly intact. Moves all 4 extremities w/o difficulty. Affect pleasant.  Assessment/Plan: 1. Chronic systolic CHF: Nonischemic cardiomyopathy.  CO preserved on RHC in 6/16, but 10/16 CPX showed moderate to severe functional impairment (seems worse than his symptoms would suggest).  However, repeat CPX in 11/17, though submaximal, suggested no more than mild HF limitation. No significant CAD with coronary angiography in 6/16.  No hx heavy ETOH or drug abuse. No marked HTN.  Negative SPEP/UPEP.  Possible prior myocarditis.  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  He now has Medtronic ICD.  Echo in 6/19 showed EF 25-30% (stable) with mild-moderate AI and mildly  decreased RV systolic function.  Echo in 8/20 showed EF mildly higher at 35%, severe LV dilation, normal RV, mild-moderate AI.  Echo in 2/22 showed EF back down to 25-30%, severe LV dilation, septal-lateral dyssynchrony, mild LVH, low normal RV function, mild-moderate AI.  Echo (8/23) showed EF 25-30%, RV low/normal, mild AI. NYHA class II symptoms.  He is not volume overloaded on exam or by OptiVol. - Continue Lasix 40 mg daily + 20 KCL daily. Labs from 11/2022 Nephrology visit reviewed, K 4.5, SCr 3.86 - Continue Coreg 9.75 mg bid - Off spironolactone with elevated creatinine.    - Off ACEI/ARB/Entresto with elevated creatinine.  - Off hydralazine with low BP during recent admit. If sBP > 140, would restart at 12.5 mg (he gets dizzy with increased dose) - GFR too low for SGLT2 inhibitor - He is off digoxin with rising creatinine.  - IVCD 132 msec on ECG 06/16/22 => Gradually widening.  Getting towards the point where he may benefit from CRT upgrade but currently with NYHA II symptoms. Consider review with EP.   2. CKD: Stage IV. Most recent SCr 3.89. He follows with Dr Posey Pronto.  3. OSA: Bipap. 4. Gout: Gout controlled on allopurinol. 5. VT/PVCs: Appropriate ICD discharge in 3/18, no recurrence. He is now on amiodarone due to very frequent PVCs. Zio patch in 6/20 showed low burden of PVCs.  - Continue Coreg as above.  - Continue amiodarone 100 mg daily. Stable LFTs and TSH (5/23), planned to re-check amio labs at visit today but he prefers to wait until follow up as he just had labs at his Nephrology appt.  He will need regular eye exams.  6. Atrial fibrillation: Paroxysmal.  Very short episodes only noted by device interrogation, none recently.  He has had no prolonged or symptomatic atrial fibrillation (data for anticoagulating short runs of atrial fibrillation is not strong), so will hold off on anticoagulation.  - If he develops prolonged or symptomatic atrial fibrillation, would anticoagulate.    Follow up in 4 months with Dr. Aundra Dubin.   Signed, Rafael Bihari, FNP  12/11/2022  [facility] [address] [facility] [place] [address] [phone] (office) [phone] (fax)

## 2022-12-11 NOTE — Patient Instructions (Signed)
Thank you for coming in today  No medication changes   No labs  Your physician recommends that you schedule a follow-up appointment in:  4 months with Dr. Kendall Flack will receive a reminder letter in the mail a few months in advance. If you don't receive a letter, please call our office to schedule the follow-up appointment.    Do the following things EVERYDAY: Weigh yourself in the morning before breakfast. Write it down and keep it in a log. Take your medicines as prescribed Eat low salt foods--Limit salt (sodium) to 2000 mg per day.  Stay as active as you can everyday Limit all fluids for the day to less than 2 liters  At the [facility], you and your health needs are our priority. As part of our continuing mission to provide you with exceptional heart care, we have created designated Provider Care Teams. These Care Teams include your primary Cardiologist (physician) and Advanced Practice Providers (APPs- Physician Assistants and Nurse Practitioners) who all work together to provide you with the care you need, when you need it.   You may see any of the following providers on your designated Care Team at your next follow up: Dr Glori Bickers Dr [name] Dr. Roxana Hires, NP Lyda Jester, [place] [facility] [place], [place] Forestine Na, NP Audry Riles, PharmD   Please be sure to bring in all your medications bottles to every appointment.    Thank you for choosing [facility]   If you have any questions or concerns before your next appointment please send [place] a message through [place] or call our office at [phone].    TO LEAVE A MESSAGE FOR THE NURSE SELECT OPTION 2, PLEASE LEAVE A MESSAGE INCLUDING: YOUR NAME DATE OF BIRTH CALL BACK NUMBER REASON FOR CALL**this is important as we prioritize the call backs  YOU WILL RECEIVE A CALL BACK THE SAME DAY AS LONG AS YOU CALL BEFORE 4:00  PM

## 2022-12-20 ENCOUNTER — Other Ambulatory Visit (HOSPITAL_COMMUNITY): Payer: Self-pay | Admitting: Cardiology

## 2022-12-20 MED ORDER — AMIODARONE HCL 100 MG PO TABS: 100.0000 mg | ORAL_TABLET | Freq: Every day | ORAL | 3 refills | Status: DC

## 2023-01-08 ENCOUNTER — Ambulatory Visit: Payer: Medicare PPO | Attending: Internal Medicine

## 2023-01-08 DIAGNOSIS — I5022 Chronic systolic (congestive) heart failure: Secondary | ICD-10-CM | POA: Diagnosis not present

## 2023-01-08 DIAGNOSIS — Z95810 Presence of automatic (implantable) cardiac defibrillator: Secondary | ICD-10-CM | POA: Diagnosis not present

## 2023-01-10 NOTE — Progress Notes (Signed)
Remote ICD transmission.   

## 2023-01-10 NOTE — Progress Notes (Signed)
EPIC Encounter for ICM Monitoring  Patient Name: Miguel Roach is a 82 y.o. male Date: 01/10/2023 Primary Care Physican: Deland Pretty, MD Primary Cardiologist: Aundra Dubin Electrophysiologist: Lovena Le Nephrologist: [place] (every 3 months) 10/31/2022 Weight: 155-160 lbs 01/10/2023 Weight: 150-153 lbs     Spoke with patient and heart failure questions reviewed.  Transmission results reviewed.  Pt reports he does not have any fluid symptoms at this time.  He is feeling great.      He reports Dr Posey Pronto advised to continue Furosemide 40 mg daily     Optivol thoracic impedance suggesting normal fluid levels.      Prescribed:  Furosemide 40 mg Take 2 tablets (80 mg total) by mouth every other day AND 1 tablet (40 mg total) every other day. Alternate 80 mg with 40 mg every other day.   Potassium 20 mEq take 1 tablet by mouth every other day per pt prescribed by nephrologist.     Labs: 07/31/2022 Creatinine 4.71, BUN 48, Potassium 4.5, Sodium 143, GFR 12 07/19/2022 Creatinine 4.36, BUN 50, Potassium 4.5, Sodium 144, GFR 13  06/27/2022 Creatinine 3.69, BUN 31, Potassium 4.3, Sodium 142, GFR 16 06/16/2022 Creatinine 3.48, BUN 34, Potassium 4.2, Sodium 144, GFR 17  A complete set of results can be found in Results Review.     Recommendations:  No changes and encouraged to call if experiencing any fluid symptoms.   Follow-up plan: ICM clinic phone appointment on 02/12/2023.   91 day device clinic remote transmission 02/26/2023.      EP/Cardiology Office Visits: Recall 04/10/2023 with Dr Aundra Dubin.   Recall 01/29/2023 with Tommye Standard, PA.   Next visit with Dr Posey Pronto in February.    Copy of ICM check sent to Dr. Lovena Le.    3 month ICM trend: 01/08/2023.    12-14 Month ICM trend:     Rosalene Billings, RN 01/10/2023 5:07 PM

## 2023-01-18 ENCOUNTER — Other Ambulatory Visit (HOSPITAL_COMMUNITY): Payer: Self-pay | Admitting: Cardiology

## 2023-02-12 ENCOUNTER — Ambulatory Visit: Payer: Medicare PPO | Attending: Internal Medicine

## 2023-02-12 DIAGNOSIS — Z95810 Presence of automatic (implantable) cardiac defibrillator: Secondary | ICD-10-CM | POA: Diagnosis not present

## 2023-02-12 DIAGNOSIS — I5022 Chronic systolic (congestive) heart failure: Secondary | ICD-10-CM

## 2023-02-14 NOTE — Progress Notes (Signed)
EPIC Encounter for ICM Monitoring  Patient Name: Miguel Roach is a 82 y.o. male Date: 02/14/2023 Primary Care Physican: Merri Brunette, MD Primary Cardiologist: Shirlee Latch Electrophysiologist: Ladona Ridgel Nephrologist: Robbie [facility] - Allena Katz (every 3 months) 10/31/2022 Weight: 155-160 lbs 01/10/2023 Weight: 150-153 lbs 02/14/2023 Weight: 153 lbs     Spoke with patient and heart failure questions reviewed.  Transmission results reviewed.  Pt reports he does not have any fluid symptoms at this time.  He is feeling great.       Optivol thoracic impedance suggesting normal fluid levels.      Prescribed:  Furosemide 40 mg Take 2 tablets (80 mg total) by mouth every other day AND 1 tablet (40 mg total) every other day. Alternate 80 mg with 40 mg every other day.  Dr Allena Katz, nephrologist recommends 40 mg daily Potassium 20 mEq take 1 tablet by mouth every other day per pt prescribed by nephrologist.     Labs: 07/31/2022 Creatinine 4.71, BUN 48, Potassium 4.5, Sodium 143, GFR 12 07/19/2022 Creatinine 4.36, BUN 50, Potassium 4.5, Sodium 144, GFR 13  06/27/2022 Creatinine 3.69, BUN 31, Potassium 4.3, Sodium 142, GFR 16 06/16/2022 Creatinine 3.48, BUN 34, Potassium 4.2, Sodium 144, GFR 17  A complete set of results can be found in Results Review.     Recommendations:  No changes and encouraged to call if experiencing any fluid symptoms.   Follow-up plan: ICM clinic phone appointment on 03/19/2023.   91 day device clinic remote transmission 02/26/2023.      EP/Cardiology Office Visits: Recall 04/10/2023 with Dr Shirlee Latch.  02/19/2023 with Francis Dowse, PA.   Dr Allena Katz in 03/05/2023.     Copy of ICM check sent to Dr. Ladona Ridgel.    3 month ICM trend: 02/12/2023.    12-14 Month ICM trend:     Karie Soda, RN 02/14/2023 2:38 PM

## 2023-02-18 NOTE — Progress Notes (Unsigned)
Cardiology Office Note Date:  02/18/2023  Patient ID:  Miguel Roach 01-02-1941, MRN [id number] PCP:  Merri Brunette, MD  Cardiologist:  Dr. Shirlee Latch Electrophysiologist: Dr. Ladona Ridgel Nephrology: Dr. Allena Katz    Chief Complaint:  *** annual EP visit  History of Present Illness: Miguel Roach is a 82 y.o. male with history of NICM, chronic CHF, ICD, HTN, AFib, CKD IV,   He comes in today to be seen for Dr. Ladona Ridgel, last seen by him Jan 2022, doing well, discussed Afib burden <0.1%.  No changes were made.   I saw him 02/03/22 He is accompanied by his wife. He feels well No CP, palpitations or cardiac awareness. No dizzy spells, near syncope or syncope. Denies SOB <0.1%, not convinced all were true AF No changes were made Planned amio labs, he preferred to have labs done at his upcoming HF clinic visit  Hospitalized 06/11/22 with sepsis, Ecoli/pseudomonas bacteremia, of unclear source 06/13/22 Discharged seems at patient urging on oral abx No EP or ID involvement Echo showed EF 25-30%, RV low/normal. BP low and GDMT held. Discharged home off Lasix & hydralazine, weight 168 lbs.   Has seen HF team a few times Last on 12/11/22, walking at the [facility] regularly.  Noting gradually widening QRS though with class II symptoms, not certain CRT upgrade warranted.  CKD and BP limiting GDMT   Discussed getting amio labs, he wanted to wait until he saw nephrology to get them done there No significant AF on device, no VT   *** AF burden *** VT? *** QRS *** volume, nephrology *** repeat BC for clearance *** amio labs   Device information MDT single chamber ICD implanted 11/24/2015 + appropriate therapy, 2018 Amiodarone started Feb 2020 with NSVTs and PVCs  Past Medical History:  Diagnosis Date   Adenomatous colon polyp 2011   AICD (automatic cardioverter/defibrillator) present    Anal fistula 2003   Arthritis    CHF (congestive heart failure) (HCC)    CRI (chronic renal insufficiency)     creatinine back to normal   Diverticulosis    ED (erectile dysfunction)    Gout    Headache    Hypertension    Internal hemorrhoids    Liver lesion 2007   Nonischemic cardiomyopathy (HCC)    EF 20-25% by echo 2016, At that time 40% RCA stenosis, distal 35% circumflex stenosis   Renal cyst 2007   "I'm not familiar with that, but I follow up on my creatinine because my kidney function is low." 09/2016   Sleep apnea    "had test; never followed thru w/getting mask" (11/25/2015)    Past Surgical History:  Procedure Laterality Date   ANAL FISTULOTOMY  01/2002   CARDIAC CATHETERIZATION N/A 04/16/2015   Procedure: Right/Left Heart Cath and Coronary Angiography;  Surgeon: Lyn Records, MD;  Location: [facility] INVASIVE CV LAB;  Service: Cardiovascular;  Laterality: N/A;   EP IMPLANTABLE DEVICE N/A 11/24/2015   Procedure: ICD Implant;  Surgeon: Marinus Maw, MD;  Location[facility] INVASIVE CV LAB;  Service: Cardiovascular;  Laterality: N/A;   ESOPHAGOGASTRODUODENOSCOPY (EGD) WITH PROPOFOL N/A 10/02/2016   Procedure: ESOPHAGOGASTRODUODENOSCOPY (EGD) WITH PROPOFOL;  Surgeon: Meryl Dare, MD;  Location: [facility] ENDOSCOPY;  Service: Endoscopy;  Laterality: N/A;   EUS N/A 10/19/2016   Procedure: UPPER ENDOSCOPIC ULTRASOUND (EUS) RADIAL;  Surgeon: Rachael Fee, MD;  Location: [facility] ENDOSCOPY;  Service: Endoscopy;  Laterality: N/A;   INSERTION OF ICD  11/24/2015   TONSILLECTOMY  ~  1950    Current Outpatient Medications  Medication Sig Dispense Refill   allopurinol (ZYLOPRIM) 100 MG tablet TAKE 1 TABLET BY MOUTH EVERY DAY 90 tablet 0   amiodarone (PACERONE) 100 MG tablet Take 1 tablet (100 mg total) by mouth daily. 90 tablet 3   carvedilol (COREG) 6.25 MG tablet TAKE 1.5 TABLETS BY MOUTH 2 TIMES DAILY WITH A MEAL. 270 tablet 2   Fe Fum-Fe Poly-Vit C-Lactobac (FUSION) 65-65-25-30 MG CAPS Take 1 tablet by mouth daily.     furosemide (LASIX) 40 MG tablet Take 2 tablets (80 mg total) by mouth every other day AND 1  tablet (40 mg total) every other day. Alternate 80 mg with 40 mg every other day.. (Patient taking differently: 1 per day) 45 tablet 11   potassium chloride SA (KLOR-CON M20) 20 MEQ tablet Take 1 tablet (20 mEq total) by mouth daily. 30 tablet 11   No current facility-administered medications for this visit.    Allergies:   Benicar [olmesartan]   Social History:  The patient  reports that he has never smoked. He has never used smokeless tobacco. He reports that he does not drink alcohol and does not use drugs.   Family History:  The patient's family history includes Breast cancer in his sister; Clotting disorder in an other family member; Colon polyps in his brother; Diabetes in his father, sister, and sister; Heart failure (age of onset: 46) in his mother; Sickle cell anemia in his brother.  ROS:  Please see the history of present illness.    All other systems are reviewed and otherwise negative.   PHYSICAL EXAM:  VS:  There were no vitals taken for this visit. BMI: There is no height or weight on file to calculate BMI. Well nourished, well developed, in no acute distress HEENT: normocephalic, atraumatic Neck: no JVD, carotid bruits or masses Cardiac:  *** RRR; no significant murmurs, no rubs, or gallops Lungs:  *** CTA b/l, no wheezing, rhonchi or rales Abd: soft, nontender MS: no deformity or atrophy Ext: *** no edema Skin: warm and dry, no rash Neuro:  No gross deficits appreciated Psych: euthymic mood, full affect  *** ICD site is stable, no tethering or discomfort   EKG:  not done tdoay  Device interrogation done today and reviewed by myself:  ***   11/23/2020: TTE  1. Global hypokinesis with septal-lateral dyssynchrony. Left ventricular  ejection fraction, by estimation, is 25 to 30%. The left ventricle has  severely decreased function. The left ventricle demonstrates global  hypokinesis. The left ventricular  internal cavity size was severely dilated. There is mild  concentric left  ventricular hypertrophy. Left ventricular diastolic parameters are  consistent with Grade I diastolic dysfunction (impaired relaxation).  Elevated left ventricular end-diastolic  pressure.   2. Right ventricular systolic function is low normal. The right  ventricular size is normal.   3. Left atrial size was mildly dilated.   4. Cannot rule out PFO with right to left flow by color flow Doppler.   5. The mitral valve is normal in structure. Trivial mitral valve  regurgitation. No evidence of mitral stenosis.   6. The aortic valve is tricuspid. Aortic valve regurgitation is mild to  moderate. No aortic stenosis is present. Aortic regurgitation PHT measures  571 msec.   7. Aortic dilatation noted. There is mild dilatation of the ascending  aorta, measuring 37 mm.   8. The inferior vena cava is normal in size with greater than 50%  respiratory  variability, suggesting right atrial pressure of 3 mmHg.    Recent Labs: 06/12/2022: ALT 30 06/13/2022: Hemoglobin 10.4; Platelets 78 06/16/2022: B Natriuretic Peptide 1,143.1 07/31/2022: BUN 48; Creatinine, Ser 4.71; Potassium 4.5; Sodium 143  No results found for requested labs within last 365 days.   CrCl cannot be calculated (Patient's most recent lab result is older than the maximum 21 days allowed.).   Wt Readings from Last 3 Encounters:  12/11/22 161 lb (73 kg)  06/16/22 168 lb 9.6 oz (76.5 kg)  06/11/22 (P) 160 lb 15 oz (73 kg)     Other studies reviewed: Additional studies/records reviewed today include: summarized above  ASSESSMENT AND PLAN:  ICD *** Intact function *** No programming changes made  VT On low dose amiodarone *** No VT  NICM Chronic CHF Renal function limits GDMT *** C/w Dr. Arlyn Leak team *** OptiVol looks great, no symptoms or exam findings of volume OL *** C/w Dr. Shirlee Latch and Dr. Allena Katz  Paroxysmal Afib, seems was remotely by notes On low dose amiodarone *** % burden,  *** not on  a/c   Disposition: ***   Current medicines are reviewed at length with the patient today.  The patient did not have any concerns regarding medicines.  Norma Fredrickson, PA-C 02/18/2023 8:25 AM     [facility] [address] [place] [address] [phone] (office)  [phone] (fax)

## 2023-02-19 ENCOUNTER — Encounter: Payer: Self-pay | Admitting: Physician Assistant

## 2023-02-19 ENCOUNTER — Ambulatory Visit: Payer: Medicare PPO | Attending: Physician Assistant | Admitting: Physician Assistant

## 2023-02-19 VITALS — BP 108/62 | HR 62 | Ht 71.0 in | Wt 156.4 lb

## 2023-02-19 DIAGNOSIS — I472 Ventricular tachycardia, unspecified: Secondary | ICD-10-CM | POA: Diagnosis not present

## 2023-02-19 DIAGNOSIS — I428 Other cardiomyopathies: Secondary | ICD-10-CM | POA: Diagnosis not present

## 2023-02-19 DIAGNOSIS — Z95810 Presence of automatic (implantable) cardiac defibrillator: Secondary | ICD-10-CM

## 2023-02-19 DIAGNOSIS — I5022 Chronic systolic (congestive) heart failure: Secondary | ICD-10-CM

## 2023-02-19 LAB — HEPATIC FUNCTION PANEL
ALT: 12 IU/L (ref 0–44)
AST: 13 IU/L (ref 0–40)
Albumin: 4 g/dL (ref 3.7–4.7)
Alkaline Phosphatase: 87 IU/L (ref 44–121)
Bilirubin Total: 0.4 mg/dL (ref 0.0–1.2)
Bilirubin, Direct: 0.12 mg/dL (ref 0.00–0.40)
Total Protein: 6.3 g/dL (ref 6.0–8.5)

## 2023-02-19 LAB — CUP PACEART INCLINIC DEVICE CHECK
Battery Remaining Longevity: 53 mo
Battery Voltage: 2.98 V
Brady Statistic RV Percent Paced: 0.26 %
Date Time Interrogation Session: 20240506134335
HighPow Impedance: 60 Ohm
Implantable Lead Connection Status: 753985
Implantable Lead Implant Date: 20170208
Implantable Lead Location: 753860
Implantable Lead Model: 6935
Implantable Pulse Generator Implant Date: 20170208
Lead Channel Impedance Value: 304 Ohm
Lead Channel Impedance Value: 399 Ohm
Lead Channel Pacing Threshold Amplitude: 0.75 V
Lead Channel Pacing Threshold Pulse Width: 0.4 ms
Lead Channel Sensing Intrinsic Amplitude: 12.625 mV
Lead Channel Sensing Intrinsic Amplitude: 14.375 mV
Lead Channel Setting Pacing Amplitude: 2 V
Lead Channel Setting Pacing Pulse Width: 0.4 ms
Lead Channel Setting Sensing Sensitivity: 0.3 mV
Zone Setting Status: 755011
Zone Setting Status: 755011

## 2023-02-19 LAB — TSH: TSH: 2.01 u[IU]/mL (ref 0.450–4.500)

## 2023-02-19 NOTE — Patient Instructions (Signed)
Medication Instructions:  Your physician recommends that you continue on your current medications as directed. Please refer to the Current Medication list given to you today.  *If you need a refill on your cardiac medications before your next appointment, please call your pharmacy*  Lab Work: TODAY: LFTs and TSH If you have labs (blood work) drawn today and your tests are completely normal, you will receive your results only by: MyChart Message (if you have MyChart) OR A paper copy in the mail If you have any lab test that is abnormal or we need to change your treatment, we will call you to review the results.  Follow-Up: At [facility], you and your health needs are our priority.  As part of our continuing mission to provide you with exceptional heart care, we have created designated Provider Care Teams.  These Care Teams include your primary Cardiologist (physician) and Advanced Practice Providers (APPs -  Physician Assistants and Nurse Practitioners) who all work together to provide you with the care you need, when you need it.  Your next appointment:   1 year  Provider:   You may see Lewayne Bunting, MD or one of the following Advanced Practice Providers on your designated Care Team:   Francis Dowse, [place] "Mardelle Matte" [place], [place] Sherie Don, NP

## 2023-02-26 ENCOUNTER — Ambulatory Visit (INDEPENDENT_AMBULATORY_CARE_PROVIDER_SITE_OTHER): Payer: Medicare PPO

## 2023-02-26 DIAGNOSIS — I5022 Chronic systolic (congestive) heart failure: Secondary | ICD-10-CM

## 2023-02-26 LAB — CUP PACEART REMOTE DEVICE CHECK
Battery Remaining Longevity: 52 mo
Battery Voltage: 2.98 V
Brady Statistic RV Percent Paced: 0.23 %
Date Time Interrogation Session: 20240513044224
HighPow Impedance: 55 Ohm
Implantable Lead Connection Status: 753985
Implantable Lead Implant Date: 20170208
Implantable Lead Location: 753860
Implantable Lead Model: 6935
Implantable Pulse Generator Implant Date: 20170208
Lead Channel Impedance Value: 304 Ohm
Lead Channel Impedance Value: 361 Ohm
Lead Channel Pacing Threshold Amplitude: 0.875 V
Lead Channel Pacing Threshold Pulse Width: 0.4 ms
Lead Channel Sensing Intrinsic Amplitude: 10.25 mV
Lead Channel Sensing Intrinsic Amplitude: 10.25 mV
Lead Channel Setting Pacing Amplitude: 2 V
Lead Channel Setting Pacing Pulse Width: 0.4 ms
Lead Channel Setting Sensing Sensitivity: 0.3 mV
Zone Setting Status: 755011
Zone Setting Status: 755011

## 2023-03-05 DIAGNOSIS — E559 Vitamin D deficiency, unspecified: Secondary | ICD-10-CM | POA: Diagnosis not present

## 2023-03-05 DIAGNOSIS — N189 Chronic kidney disease, unspecified: Secondary | ICD-10-CM | POA: Diagnosis not present

## 2023-03-10 ENCOUNTER — Other Ambulatory Visit (HOSPITAL_COMMUNITY): Payer: Self-pay | Admitting: Cardiology

## 2023-03-13 DIAGNOSIS — D631 Anemia in chronic kidney disease: Secondary | ICD-10-CM | POA: Diagnosis not present

## 2023-03-13 DIAGNOSIS — N2581 Secondary hyperparathyroidism of renal origin: Secondary | ICD-10-CM | POA: Diagnosis not present

## 2023-03-13 DIAGNOSIS — I120 Hypertensive chronic kidney disease with stage 5 chronic kidney disease or end stage renal disease: Secondary | ICD-10-CM | POA: Diagnosis not present

## 2023-03-13 DIAGNOSIS — N185 Chronic kidney disease, stage 5: Secondary | ICD-10-CM | POA: Diagnosis not present

## 2023-03-14 ENCOUNTER — Other Ambulatory Visit (HOSPITAL_COMMUNITY): Payer: Self-pay | Admitting: Cardiology

## 2023-03-19 ENCOUNTER — Ambulatory Visit: Payer: Medicare PPO | Attending: Internal Medicine

## 2023-03-19 DIAGNOSIS — Z95810 Presence of automatic (implantable) cardiac defibrillator: Secondary | ICD-10-CM | POA: Diagnosis not present

## 2023-03-19 DIAGNOSIS — I5022 Chronic systolic (congestive) heart failure: Secondary | ICD-10-CM

## 2023-03-21 NOTE — Progress Notes (Signed)
EPIC Encounter for ICM Monitoring  Patient Name: Miguel Roach is a 82 y.o. male Date: 03/21/2023 Primary Care Physican: Merri Brunette, MD Primary Cardiologist: Shirlee Latch Electrophysiologist: Ladona Ridgel Nephrologist: Robbie Lis Kid[facility] - Allena Katz (every 3 months) 10/31/2022 Weight: 155-160 lbs 01/10/2023 Weight: 150-153 lbs 02/14/2023 Weight: 153 lbs     Spoke with patient and heart failure questions reviewed.  Transmission results reviewed.  Pt reports he does not have any fluid symptoms at this time.  He is feeling great.       Optivol thoracic impedance suggesting normal fluid levels with exception of a couple of days of possible fluid accumulation over the [facility].      Prescribed:  Furosemide 40 mg Take 2 tablets (80 mg total) by mouth every other day AND 1 tablet (40 mg total) every other day. Alternate 80 mg with 40 mg every other day.  03/21/2023 Dr Allena Katz, nephrologist recommends 40 mg daily Potassium 20 mEq take 1 tablet by mouth every other day per pt prescribed by nephrologist.     Labs: 07/31/2022 Creatinine 4.71, BUN 48, Potassium 4.5, Sodium 143, GFR 12 07/19/2022 Creatinine 4.36, BUN 50, Potassium 4.5, Sodium 144, GFR 13  06/27/2022 Creatinine 3.69, BUN 31, Potassium 4.3, Sodium 142, GFR 16 06/16/2022 Creatinine 3.48, BUN 34, Potassium 4.2, Sodium 144, GFR 17  A complete set of results can be found in Results Review.     Recommendations:  No changes and encouraged to call if experiencing any fluid symptoms.   Follow-up plan: ICM clinic phone appointment on 04/23/2023.   91 day device clinic remote transmission 05/28/2023.      EP/Cardiology Office Visits: 04/12/2023 with Dr Shirlee Latch.  02/14/2024 with Dr Ladona Ridgel.       Copy of ICM check sent to Dr. Ladona Ridgel.    3 month ICM trend: 03/19/2023.    12-14 Month ICM trend:     Karie Soda, RN 03/21/2023 3:46 PM

## 2023-03-26 NOTE — Progress Notes (Signed)
Remote ICD transmission.   

## 2023-04-12 ENCOUNTER — Ambulatory Visit (HOSPITAL_COMMUNITY)
Admission: RE | Admit: 2023-04-12 | Discharge: 2023-04-12 | Disposition: A | Discharge status: Home / Self Care | Payer: Medicare PPO | Source: Ambulatory Visit | Attending: Cardiology | Admitting: Cardiology

## 2023-04-12 ENCOUNTER — Encounter (HOSPITAL_COMMUNITY): Payer: Self-pay | Admitting: Cardiology

## 2023-04-12 VITALS — BP 120/60 | HR 60 | Wt 154.4 lb

## 2023-04-12 DIAGNOSIS — Z8249 Family history of ischemic heart disease and other diseases of the circulatory system: Secondary | ICD-10-CM | POA: Diagnosis not present

## 2023-04-12 DIAGNOSIS — I5042 Chronic combined systolic (congestive) and diastolic (congestive) heart failure: Secondary | ICD-10-CM | POA: Diagnosis not present

## 2023-04-12 DIAGNOSIS — G4733 Obstructive sleep apnea (adult) (pediatric): Secondary | ICD-10-CM | POA: Insufficient documentation

## 2023-04-12 DIAGNOSIS — Z79899 Other long term (current) drug therapy: Secondary | ICD-10-CM | POA: Insufficient documentation

## 2023-04-12 DIAGNOSIS — I480 Paroxysmal atrial fibrillation: Secondary | ICD-10-CM | POA: Diagnosis not present

## 2023-04-12 DIAGNOSIS — I454 Nonspecific intraventricular block: Secondary | ICD-10-CM | POA: Insufficient documentation

## 2023-04-12 DIAGNOSIS — N184 Chronic kidney disease, stage 4 (severe): Secondary | ICD-10-CM | POA: Insufficient documentation

## 2023-04-12 DIAGNOSIS — Z95810 Presence of automatic (implantable) cardiac defibrillator: Secondary | ICD-10-CM | POA: Diagnosis not present

## 2023-04-12 DIAGNOSIS — I472 Ventricular tachycardia, unspecified: Secondary | ICD-10-CM | POA: Insufficient documentation

## 2023-04-12 DIAGNOSIS — M109 Gout, unspecified: Secondary | ICD-10-CM | POA: Insufficient documentation

## 2023-04-12 DIAGNOSIS — I428 Other cardiomyopathies: Secondary | ICD-10-CM | POA: Diagnosis not present

## 2023-04-12 DIAGNOSIS — I5023 Acute on chronic systolic (congestive) heart failure: Secondary | ICD-10-CM | POA: Diagnosis not present

## 2023-04-12 DIAGNOSIS — I5022 Chronic systolic (congestive) heart failure: Secondary | ICD-10-CM

## 2023-04-12 LAB — BASIC METABOLIC PANEL
Anion gap: 8 (ref 5–15)
BUN: 42 mg/dL — ABNORMAL HIGH (ref 8–23)
CO2: 24 mmol/L (ref 22–32)
Calcium: 8.8 mg/dL — ABNORMAL LOW (ref 8.9–10.3)
Chloride: 109 mmol/L (ref 98–111)
Creatinine, Ser: 4.33 mg/dL — ABNORMAL HIGH (ref 0.61–1.24)
GFR, Estimated: 13 mL/min — ABNORMAL LOW (ref >60)
Glucose, Bld: 111 mg/dL — ABNORMAL HIGH (ref 70–99)
Potassium: 4 mmol/L (ref 3.5–5.1)
Sodium: 141 mmol/L (ref 135–145)

## 2023-04-12 NOTE — Progress Notes (Signed)
ID:  Jessup, Ogas Dec 06, 1940, MRN [id number]   Provider location: [facility] Type of Visit: Established patient   PCP:  Merri Brunette, MD  HF Cardiologist:  Dr. Shirlee Latch   HPI: Miguel Roach is a 82 y.o. male who has a history of chronic systolic CHF/nonischemic cardiomyopathy and CKD. He actually had an echo back in 2010 with EF 40-45% at the time.  He says that this really was not followed up upon by a cardiologist and that he did well for a number of years after that. In 6/16, he was admitted with acute systolic CHF.  Echo showed EF down to 20-25%.  Cardiac cath showed nonobstructive CAD.  His Lasix has been gradually increased.  This has brought his weight down and improved his breathing, but creatinine has risen. He had been on olmesartan but this was stopped because he thought it was causing cough.  He was then put on losartan, but this was stopped with rise in creatinine. Also intolerant to Bidil with orthostatic symptoms.  He was started on digoxin.  Echo in 12/16 showed persistently low EF, 15-20%.  Medtronic ICD was placed in 2/17.  Repeat echo 1/18 showed EF 25% with diffuse hypokinesis.   In 3/18, he had an episode of about 10 minutes atrial fibrillation noted on his ICD.  He also had 1 episode of VT terminated by an appropriate discharge.  He had push-mowed his grass and was very fatigued when he had the shock.  Since then, he has had no further arrhythmias.   Echo in 6/19 showed EF 25-30% with mild LV dilation and mild-moderate AI, normal RV size with mildly decreased systolic function, IVC normal.   Admitted 1/27 - 11/17/2018 with hypotension and found to have UTI/sepsis. Treated on ABX and IVF. Eventually transitioned back to po diuretics with HF team following. 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 Noted to have increased NSVT and PVCs, started on amiodarone.  Zio patch in 6/20 showed occasional PVCs, burden low.   Echo in 8/20 showed EF 35%, severe LV  dilation, normal RV size and systolic function, mild-moderate AI.   Echo in 2/22 showed EF 25-30%, severe LV dilation, septal-lateral dyssynchrony, mild LVH, low normal RV function, mild-moderate AI.   Admitted 8/23 w/ sepsis. Blood Cx showed e-coli and pseudomonas. Received IV abx. Echo showed EF 25-30%, RV low/normal. BP low and GDMT held. Discharged home off Lasix & hydralazine, weight 168 lbs.  He returns today to followup for CHF.  Weight down 7 lbs.  He has been going to the [facility] 3 times/week. He walks on the track with no exertional dyspnea.  No orthopnea/PND.  No lightheadedness. No chest pain.   ECG (personally reviewed): NSR, 1st degree AVB, right superior axis, IVCD 142 msec  Medtronic device interrogation (personally reviewed): No AF/VT, stable thoracic impedance.   Labs (1/19): K 3.8, creatinine 3.15 Labs (2/19): K 4.0, creatinine 3.23 Labs (3/19): digoxin 0.4, K 3.8, creatinine 3.19 Labs (6/19): digoxin 0.3, K 3.9, creatinine 3.12 Labs (9/19): K 3.9, creatinine 3.58, digoxin 0.3, LDL 91 Labs (3/20): K 4, creatinine 3.71 Labs (6/20): K 3.9, creatinine 3.94, LFTs normal, TSH normal Labs (8/20): K 3.7, creatinine 4.08, LFTs normal, TSH normal Labs (1/22): K 3.5, creatinine 4.34, LFTs normal Labs (3/22): K 3.4, creatinine 4.43 Labs (5/22): K 3.7, creatinine 4.59, LFTs normal, TSH normal Labs (9/22): K 3.5, creatinine 4.3, TSH normal Labs (8/23): K 3.8, creatinine 3.78 Labs (11/23): K 4.6, creatinine 4.09 Labs (  2/24): K 4.5, creatinine 3.89 Labs (5/24): TSH normal, LFTs normal  PMH: 1. CKD: Stage IV.  Follows with Dr Allena Katz.  2. Gout 3. Chronic systolic CHF: Nonischemic cardiomyopathy.  EF 40-45% in 2010.  Echo (6/16) with EF 20-25%, severe LV dilation, mild MR, mild AI, moderate LAE.  LHC/RHC (6/16) with nonobstructive CAD; mean RA 2, RV 48/1, PA 47/18, mean PCWP 12, CI 3.2.  CPX (10/16) with peak VO2 13.1 (50% predicted), VE/VCO2 slope 39.3, RER 1.22 => moderate to severe  functional limitation.  Echo (12/16) with EF 15-20%, severe LV dilation, diffuse hypokinesis, moderate AI, mild MR, normal RV size and systolic function. Medtronic ICD.  - CPX (11/17): Submaximal with RER 0.95, VE/VCO2 slope 27, peak VO2 18.2.  Suspect no more than mild HF limitation.  - Echo (1/18): EF 25%, diffuse hypokinesis, normal RV size and systolic function, mild AI.  - Echo (6/19): EF 25-30% with mild LV dilation and mild-moderate AI, normal RV size with mildly decreased systolic function, IVC normal.  - Echo (8/20): EF 35%, severe LV dilation, normal RV size and systolic function, mild-moderate AI.  - Echo (2/22): EF 25-30%, severe LV dilation, septal-lateral dyssynchrony, mild LVH, low normal RV function, mild-moderate AI.  - Echo (8/23): EF 25-30%, severe LV dysfunction, grade I DD, RV low/normal, mild AI, AAA 39 mm. 4. ACEI cough.  5. Pleural effusion: Thoracentesis on right in 8/16.  It was a transudate, likely due to CHF.  6. OSA: To start Bipap.  7. Esophageal duplication cyst.  8. High resolution CT chest 12/17: No evidence for interstitial lung disease.  9. Atrial fibrillation: Paroxysmal, 1 10 minute episode in 3/18.  10. VT: ICD discharge 3/18.  11. 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  - Zio patch in 6/20: occasional PVCs, low burden overall.  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   FH: Mother with "weak heart." Brother with sickle cell anemia.   ROS: All systems reviewed and negative except as per HPI.   Current Outpatient Medications  Medication Sig Dispense Refill   allopurinol (ZYLOPRIM) 100 MG tablet TAKE 1 TABLET BY MOUTH EVERY DAY 90 tablet 0   amiodarone (PACERONE) 100 MG tablet Take 1 tablet (100 mg total) by mouth daily. 90 tablet 3   carvedilol (COREG) 6.25 MG tablet TAKE 1.5 TABLETS BY MOUTH 2 TIMES DAILY WITH A MEAL. 270 tablet 2   Fe Fum-Fe Poly-Vit C-Lactobac (FUSION) 65-65-25-30 MG CAPS Take 1 tablet by mouth daily.     furosemide  (LASIX) 40 MG tablet Take 2 tablets (80 mg total) by mouth every other day AND 1 tablet (40 mg total) every other day. Alternate 80 mg with 40 mg every other day.. 45 tablet 11   potassium chloride (KLOR-CON) 20 MEQ packet Take 20 mEq by mouth every other day.     No current facility-administered medications for this encounter.   BP 120/60   Pulse 60   Wt 70 kg (154 lb 6.4 oz)   SpO2 100%   BMI 21.53 kg/m   Wt Readings from Last 3 Encounters:  04/12/23 70 kg (154 lb 6.4 oz)  02/19/23 70.9 kg (156 lb 6.4 oz)  12/11/22 73 kg (161 lb)   General: NAD Neck: No JVD, no thyromegaly or thyroid nodule.  Lungs: Clear to auscultation bilaterally with normal respiratory effort. CV: Nondisplaced PMI.  Heart regular S1/S2, no S3/S4, no murmur.  No peripheral edema.  No carotid bruit.  Normal pedal pulses.  Abdomen: Soft, nontender,  no hepatosplenomegaly, no distention.  Skin: Intact without lesions or rashes.  Neurologic: Alert and oriented x 3.  Psych: Normal affect. Extremities: No clubbing or cyanosis.  HEENT: Normal.   Assessment/Plan: 1. Chronic systolic CHF: Nonischemic cardiomyopathy.  CO preserved on RHC in 6/16, but 10/16 CPX showed moderate to severe functional impairment (seems worse than his symptoms would suggest).  However, repeat CPX in 11/17, though submaximal, suggested no more than mild HF limitation. No significant CAD with coronary angiography in 6/16.  No hx heavy ETOH or drug abuse. No marked HTN.  Negative SPEP/UPEP.  Possible prior myocarditis.  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  He now has Medtronic ICD.  Echo in 6/19 showed EF 25-30% (stable) with mild-moderate AI and mildly decreased RV systolic function.  Echo in 8/20 showed EF mildly higher at 35%, severe LV dilation, normal RV, mild-moderate AI.  Echo in 2/22 showed EF back down to 25-30%, severe LV dilation, septal-lateral dyssynchrony, mild LVH, low normal RV function,  mild-moderate AI.  Echo (8/23) showed EF 25-30%, RV low/normal, mild AI. NYHA class I-II.  He is not volume overloaded on exam or by OptiVol. - Continue Lasix 80 daily alternating with 40 daily + 20 KCL daily. BMET today.  - Increase Coreg back to 9.375 mg bid.  - Off spironolactone with elevated creatinine.    - Off ACEI/ARB/Entresto with elevated creatinine.  - Off hydralazine with low BP during last admit.  - GFR too low for SGLT2 inhibitor - He is off digoxin with rising creatinine.  - IVCD 142 msec on ECG 06/16/22 => Gradually widening.  Getting towards the point where he may benefit from CRT upgrade but currently with NYHA I-II symptoms. Consider review with EP.   - Echo at followup appt.  2. CKD: Stage IV. BMET today.   3. OSA: Bipap. 4. Gout: Gout controlled on allopurinol. 5. VT/PVCs: Appropriate ICD discharge in 3/18, no recurrence. He is now on amiodarone due to very frequent PVCs. Zio patch in 6/20 showed low burden of PVCs.  - Continue Coreg as above.  - Continue amiodarone 100 mg daily. Stable LFTs and TSH (5/24).  He will need regular eye exams.  6. Atrial fibrillation: Paroxysmal.  Very short episodes only noted by device interrogation, none recently.  He has had no prolonged or symptomatic atrial fibrillation (data for anticoagulating short runs of atrial fibrillation is not strong), so will hold off on anticoagulation.  - If he develops prolonged or symptomatic atrial fibrillation, would anticoagulate.   Follow up in 4 months with echo.   Signed, Marca Ancona, MD  04/12/2023  [facility] [address] [facility] [place] [address] [phone] (office) [phone] (fax)

## 2023-04-12 NOTE — Patient Instructions (Signed)
INCREASE Carvedilol back to 9.375 mg Twice daily  Labs done today, your results will be available in MyChart, we will contact you for abnormal readings.  Your physician has requested that you have an echocardiogram. Echocardiography is a painless test that uses sound waves to create images of your heart. It provides your doctor with information about the size and shape of your heart and how well your heart's chambers and valves are working. This procedure takes approximately one hour. There are no restrictions for this procedure. Please do NOT wear cologne, perfume, aftershave, or lotions (deodorant is allowed). Please arrive 15 minutes prior to your appointment time.  Your physician recommends that you schedule a follow-up appointment in: 4 months with an echocardiogram ( October) ** please call the office in August to arrange your follow up appointment. **  If you have any questions or concerns before your next appointment please send [place] a message through [place] or call our office at [phone].    TO LEAVE A MESSAGE FOR THE NURSE SELECT OPTION 2, PLEASE LEAVE A MESSAGE INCLUDING: YOUR NAME DATE OF BIRTH CALL BACK NUMBER REASON FOR CALL**this is important as we prioritize the call backs  YOU WILL RECEIVE A CALL BACK THE SAME DAY AS LONG AS YOU CALL BEFORE 4:00 PM  At the [facility], you and your health needs are our priority. As part of our continuing mission to provide you with exceptional heart care, we have created designated Provider Care Teams. These Care Teams include your primary Cardiologist (physician) and Advanced Practice Providers (APPs- Physician Assistants and Nurse Practitioners) who all work together to provide you with the care you need, when you need it.   You may see any of the following providers on your designated Care Team at your next follow up: Dr Arvilla Meres Dr Marca Ancona Dr. Marcos Eke, NP Robbie Lis,  [place] [facility] [place], [place] Brynda Peon, NP Karle Plumber, PharmD   Please be sure to bring in all your medications bottles to every appointment.    Thank you for choosing [facility]

## 2023-04-18 ENCOUNTER — Encounter: Payer: Self-pay | Admitting: Cardiology

## 2023-04-23 ENCOUNTER — Ambulatory Visit: Payer: Medicare PPO | Attending: Internal Medicine

## 2023-04-23 DIAGNOSIS — I5042 Chronic combined systolic (congestive) and diastolic (congestive) heart failure: Secondary | ICD-10-CM

## 2023-04-23 DIAGNOSIS — Z95810 Presence of automatic (implantable) cardiac defibrillator: Secondary | ICD-10-CM

## 2023-04-25 ENCOUNTER — Telehealth: Payer: Self-pay

## 2023-04-25 NOTE — Progress Notes (Signed)
EPIC Encounter for ICM Monitoring  Patient Name: Miguel Roach is a 82 y.o. male Date: 04/25/2023 Primary Care Physican: Merri Brunette, MD Primary Cardiologist: Shirlee Latch Electrophysiologist: Ladona Ridgel Nephrologist: Robbie [facility] - Allena Katz (every 3 months) 10/31/2022 Weight: 155-160 lbs 01/10/2023 Weight: 150-153 lbs 02/14/2023 Weight: 153 lbs     Attempted call to patient and unable to reach.  Left detailed message per DPR regarding transmission. Transmission reviewed.     Optivol thoracic impedance suggesting normal fluid levels with exception of 7/1-7/5   Prescribed:  Furosemide 40 mg Take 2 tablets (80 mg total) by mouth every other day AND 1 tablet (40 mg total) every other day. Alternate 80 mg with 40 mg every other day.  03/21/2023 Dr Allena Katz, nephrologist recommends 40 mg daily Potassium 20 mEq take 1 tablet by mouth every other day per pt prescribed by nephrologist.     Labs: 04/12/2023 Creatinine 4.33, BUN 42, Potassium 4.0, Sodium 141, GFR 13 07/31/2022 Creatinine 4.71, BUN 48, Potassium 4.5, Sodium 143, GFR 12 A complete set of results can be found in Results Review.     Recommendations:  Left voice mail with ICM number and encouraged to call if experiencing any fluid symptoms.   Follow-up plan: ICM clinic phone appointment on 05/29/2023.   91 day device clinic remote transmission 05/28/2023.      EP/Cardiology Office Visits:  Recall 08/10/2023 with Dr Shirlee Latch.  02/14/2024 with Dr Ladona Ridgel.       Copy of ICM check sent to Dr. Ladona Ridgel.    3 month ICM trend: 04/23/2023.    12-14 Month ICM trend:     Karie Soda, RN 04/25/2023 2:54 PM

## 2023-04-25 NOTE — Telephone Encounter (Signed)
Remote ICM transmission received.  Attempted call to patient regarding ICM remote transmission and left detailed message per DPR.  Left ICM phone number and advised to return call for any fluid symptoms or questions. Next ICM remote transmission scheduled 05/29/2023.    

## 2023-05-28 ENCOUNTER — Ambulatory Visit (INDEPENDENT_AMBULATORY_CARE_PROVIDER_SITE_OTHER): Payer: Medicare PPO

## 2023-05-28 DIAGNOSIS — I428 Other cardiomyopathies: Secondary | ICD-10-CM

## 2023-05-28 DIAGNOSIS — I5022 Chronic systolic (congestive) heart failure: Secondary | ICD-10-CM

## 2023-05-29 ENCOUNTER — Ambulatory Visit: Payer: Medicare PPO

## 2023-05-29 DIAGNOSIS — I5042 Chronic combined systolic (congestive) and diastolic (congestive) heart failure: Secondary | ICD-10-CM | POA: Diagnosis not present

## 2023-05-29 DIAGNOSIS — Z95810 Presence of automatic (implantable) cardiac defibrillator: Secondary | ICD-10-CM | POA: Diagnosis not present

## 2023-05-30 ENCOUNTER — Other Ambulatory Visit (HOSPITAL_COMMUNITY): Payer: Self-pay | Admitting: Cardiology

## 2023-06-01 ENCOUNTER — Telehealth: Payer: Self-pay

## 2023-06-01 NOTE — Telephone Encounter (Signed)
 Remote ICM transmission received.  Attempted call to patient regarding ICM remote transmission and left detailed message per DPR.  Left ICM phone number and advised to return call for any fluid symptoms or questions. Next ICM remote transmission scheduled 07/02/2023.

## 2023-06-01 NOTE — Progress Notes (Signed)
EPIC Encounter for ICM Monitoring  Patient Name: Miguel Roach is a 82 y.o. male Date: 06/01/2023 Primary Care Physican: Merri Brunette, MD Primary Cardiologist: Shirlee Latch Electrophysiologist: Ladona Ridgel Nephrologist: Robbie Lis [facility] - Allena Katz (every 3 months) 10/31/2022 Weight: 155-160 lbs 01/10/2023 Weight: 150-153 lbs 02/14/2023 Weight: 153 lbs     Attempted call to patient and unable to reach.  Left detailed message per DPR regarding transmission. Transmission reviewed.     Optivol thoracic impedance suggesting normal fluid levels within the last month.   Prescribed:  Furosemide 40 mg Take 2 tablets (80 mg total) by mouth every other day AND 1 tablet (40 mg total) every other day. Alternate 80 mg with 40 mg every other day.  03/21/2023 Dr Allena Katz, nephrologist recommends 40 mg daily Potassium 20 mEq take 1 tablet by mouth every other day per pt prescribed by nephrologist.     Labs: 04/12/2023 Creatinine 4.33, BUN 42, Potassium 4.0, Sodium 141, GFR 13 07/31/2022 Creatinine 4.71, BUN 48, Potassium 4.5, Sodium 143, GFR 12 A complete set of results can be found in Results Review.     Recommendations:  Left voice mail with ICM number and encouraged to call if experiencing any fluid symptoms.   Follow-up plan: ICM clinic phone appointment on 07/02/2023.   91 day device clinic remote transmission 08/27/2023.      EP/Cardiology Office Visits:  Recall 08/10/2023 with Dr Shirlee Latch.  02/14/2024 with Dr Ladona Ridgel.       Copy of ICM check sent to Dr. Ladona Ridgel.    3 month ICM trend: 05/28/2023.    12-14 Month ICM trend:     Karie Soda, RN 06/01/2023 2:22 PM

## 2023-06-11 NOTE — Progress Notes (Signed)
Remote ICD transmission.   

## 2023-06-12 DIAGNOSIS — N189 Chronic kidney disease, unspecified: Secondary | ICD-10-CM | POA: Diagnosis not present

## 2023-06-12 DIAGNOSIS — I120 Hypertensive chronic kidney disease with stage 5 chronic kidney disease or end stage renal disease: Secondary | ICD-10-CM | POA: Diagnosis not present

## 2023-06-12 DIAGNOSIS — N2581 Secondary hyperparathyroidism of renal origin: Secondary | ICD-10-CM | POA: Diagnosis not present

## 2023-06-12 DIAGNOSIS — N185 Chronic kidney disease, stage 5: Secondary | ICD-10-CM | POA: Diagnosis not present

## 2023-06-12 DIAGNOSIS — D631 Anemia in chronic kidney disease: Secondary | ICD-10-CM | POA: Diagnosis not present

## 2023-07-02 ENCOUNTER — Ambulatory Visit: Payer: Medicare PPO | Attending: Internal Medicine

## 2023-07-02 DIAGNOSIS — I5042 Chronic combined systolic (congestive) and diastolic (congestive) heart failure: Secondary | ICD-10-CM | POA: Diagnosis not present

## 2023-07-02 DIAGNOSIS — Z95810 Presence of automatic (implantable) cardiac defibrillator: Secondary | ICD-10-CM

## 2023-07-05 ENCOUNTER — Telehealth: Payer: Self-pay

## 2023-07-05 NOTE — Progress Notes (Signed)
EPIC Encounter for ICM Monitoring  Patient Name: Miguel Roach is a 82 y.o. male Date: 07/05/2023 Primary Care Physican: Merri Brunette, MD Primary Cardiologist: Shirlee Latch Electrophysiologist: Ladona Ridgel Nephrologist: Robbie Lis [facility] - Allena Katz (every 3 months) 10/31/2022 Weight: 155-160 lbs 01/10/2023 Weight: 150-153 lbs 02/14/2023 Weight: 153 lbs     Attempted call to patient and unable to reach.  Left detailed message per DPR regarding transmission. Transmission reviewed.     Optivol thoracic impedance suggesting normal fluid levels within the last month.   Prescribed:  Furosemide 40 mg Take 2 tablets (80 mg total) by mouth every other day AND 1 tablet (40 mg total) every other day. Alternate 80 mg with 40 mg every other day.  03/21/2023 Dr Allena Katz, nephrologist recommends 40 mg daily Potassium 20 mEq take 1 tablet by mouth every other day per pt prescribed by nephrologist.     Labs: 04/12/2023 Creatinine 4.33, BUN 42, Potassium 4.0, Sodium 141, GFR 13 07/31/2022 Creatinine 4.71, BUN 48, Potassium 4.5, Sodium 143, GFR 12 A complete set of results can be found in Results Review.     Recommendations:  Left voice mail with ICM number and encouraged to call if experiencing any fluid symptoms.   Follow-up plan: ICM clinic phone appointment on 08/06/2023.   91 day device clinic remote transmission 08/27/2023.      EP/Cardiology Office Visits:  Recall 08/10/2023 with Dr Shirlee Latch.  Recall 02/14/2024 with Dr Ladona Ridgel.       Copy of ICM check sent to Dr. Ladona Ridgel.    3 month ICM trend: 07/02/2023.    12-14 Month ICM trend:     Karie Soda, RN 07/05/2023 11:00 AM

## 2023-07-05 NOTE — Telephone Encounter (Signed)
Remote ICM transmission received.  Attempted call to patient regarding ICM remote transmission and left detailed message per DPR.  Left ICM phone number and advised to return call for any fluid symptoms or questions. Next ICM remote transmission scheduled 08/06/2023.

## 2023-07-26 ENCOUNTER — Other Ambulatory Visit (HOSPITAL_COMMUNITY): Payer: Self-pay

## 2023-07-26 MED ORDER — FUROSEMIDE 40 MG PO TABS: ORAL_TABLET | ORAL | 6 refills | Status: DC

## 2023-08-06 ENCOUNTER — Ambulatory Visit: Payer: Medicare PPO | Attending: Internal Medicine

## 2023-08-06 DIAGNOSIS — Z95810 Presence of automatic (implantable) cardiac defibrillator: Secondary | ICD-10-CM | POA: Diagnosis not present

## 2023-08-06 DIAGNOSIS — I5042 Chronic combined systolic (congestive) and diastolic (congestive) heart failure: Secondary | ICD-10-CM

## 2023-08-09 ENCOUNTER — Other Ambulatory Visit: Payer: Self-pay | Admitting: Family Medicine

## 2023-08-09 NOTE — Progress Notes (Signed)
EPIC Encounter for ICM Monitoring  Patient Name: Miguel Roach is a 82 y.o. male Date: 08/09/2023 Primary Care Physican: Merri Brunette, MD Primary Cardiologist: Shirlee Latch Electrophysiologist: Ladona Ridgel Nephrologist: Robbie Lis [facility] - Allena Katz (every 3 months) 10/31/2022 Weight: 155-160 lbs 01/10/2023 Weight: 150-153 lbs 02/14/2023 Weight: 153 lbs 08/09/2023 Weight: 142 lbs     Spoke with patient and heart failure questions reviewed.  Transmission results reviewed.  Pt asymptomatic for fluid accumulation.  Reports feeling well at this time and voices no complaints.  Wife dx with brain cancer and treated with radiation.  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  She is doing okay.       Optivol thoracic impedance suggesting normal fluid levels within the last month.   Prescribed:  Furosemide 40 mg Take 2 tablets (80 mg total) by mouth every other day AND 1 tablet (40 mg total) every other day. Alternate 80 mg with 40 mg every other day.  03/21/2023 Dr Allena Katz, nephrologist recommends 40 mg daily Potassium 20 mEq take 1 tablet by mouth every other day per pt prescribed by nephrologist.     Labs: 04/12/2023 Creatinine 4.33, BUN 42, Potassium 4.0, Sodium 141, GFR 13 07/31/2022 Creatinine 4.71, BUN 48, Potassium 4.5, Sodium 143, GFR 12 A complete set of results can be found in Results Review.     Recommendations:  No changes and encouraged to call if experiencing any fluid symptoms.   Follow-up plan: ICM clinic phone appointment on 09/10/2023.   91 day device clinic remote transmission 08/27/2023.      EP/Cardiology Office Visits:   Advised to call Dr Alford Highland office for routine 4 month follow up.   Recall 08/10/2023 with Dr Shirlee Latch.  Recall 02/14/2024 with Dr Ladona Ridgel.       Copy of ICM check sent to Dr. Ladona Ridgel.    3 month ICM trend: 08/06/2023.    12-14 Month ICM trend:     Karie Soda, RN 08/09/2023 2:53 PM

## 2023-08-10 ENCOUNTER — Other Ambulatory Visit (HOSPITAL_COMMUNITY): Payer: Self-pay | Admitting: Cardiology

## 2023-08-10 DIAGNOSIS — I5042 Chronic combined systolic (congestive) and diastolic (congestive) heart failure: Secondary | ICD-10-CM

## 2023-08-27 ENCOUNTER — Ambulatory Visit (INDEPENDENT_AMBULATORY_CARE_PROVIDER_SITE_OTHER): Payer: Medicare PPO

## 2023-08-27 DIAGNOSIS — I428 Other cardiomyopathies: Secondary | ICD-10-CM

## 2023-08-27 DIAGNOSIS — I5022 Chronic systolic (congestive) heart failure: Secondary | ICD-10-CM | POA: Diagnosis not present

## 2023-08-28 LAB — CUP PACEART REMOTE DEVICE CHECK
Battery Remaining Longevity: 43 mo
Battery Voltage: 2.98 V
Brady Statistic RV Percent Paced: 0.17 %
Date Time Interrogation Session: 20241111043721
HighPow Impedance: 56 Ohm
Implantable Lead Connection Status: 753985
Implantable Lead Implant Date: 20170208
Implantable Lead Location: 753860
Implantable Lead Model: 6935
Implantable Pulse Generator Implant Date: 20170208
Lead Channel Impedance Value: 304 Ohm
Lead Channel Impedance Value: 342 Ohm
Lead Channel Pacing Threshold Amplitude: 0.875 V
Lead Channel Pacing Threshold Pulse Width: 0.4 ms
Lead Channel Sensing Intrinsic Amplitude: 11.5 mV
Lead Channel Sensing Intrinsic Amplitude: 11.5 mV
Lead Channel Setting Pacing Amplitude: 2 V
Lead Channel Setting Pacing Pulse Width: 0.4 ms
Lead Channel Setting Sensing Sensitivity: 0.3 mV
Zone Setting Status: 755011
Zone Setting Status: 755011

## 2023-08-30 NOTE — Progress Notes (Incomplete)
ID:  Miguel Roach, Miguel Roach September 14, 1941, MRN [id number]   Provider location: [place] [facility] Type of Visit: Established patient   PCP:  Miguel Brunette, MD  HF Cardiologist:  Miguel. Shirlee Roach   HPI: Miguel Roach is a 82 y.o. male who has a history of chronic systolic CHF/nonischemic cardiomyopathy and CKD. He actually had an echo back in 2010 with EF 40-45% at the time.  He says that this really was not followed up upon by a cardiologist and that he did well for a number of years after that. In 6/16, he was admitted with acute systolic CHF.  Echo showed EF down to 20-25%.  Cardiac cath showed nonobstructive CAD.  His Lasix has been gradually increased.  This has brought his weight down and improved his breathing, but creatinine has risen. He had been on olmesartan but this was stopped because he thought it was causing cough.  He was then put on losartan, but this was stopped with rise in creatinine. Also intolerant to Bidil with orthostatic symptoms.  He was started on digoxin.  Echo in 12/16 showed persistently low EF, 15-20%.  Medtronic ICD was placed in 2/17.  Repeat echo 1/18 showed EF 25% with diffuse hypokinesis.   In 3/18, he had an episode of about 10 minutes atrial fibrillation noted on his ICD.  He also had 1 episode of VT terminated by an appropriate discharge.  He had push-mowed his grass and was very fatigued when he had the shock.  Since then, he has had no further arrhythmias.   Echo in 6/19 showed EF 25-30% with mild LV dilation and mild-moderate AI, normal RV size with mildly decreased systolic function, IVC normal.   Admitted 1/27 - 11/17/2018 with hypotension and found to have UTI/sepsis. Treated on ABX and IVF. Eventually transitioned back to po diuretics with HF team following. 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 Noted to have increased NSVT and PVCs, started on amiodarone.  Zio patch in 6/20 showed occasional PVCs, burden low.   Echo in 8/20 showed EF 35%, severe LV  dilation, normal RV size and systolic function, mild-moderate AI.   Echo in 2/22 showed EF 25-30%, severe LV dilation, septal-lateral dyssynchrony, mild LVH, low normal RV function, mild-moderate AI.   Admitted 8/23 w/ sepsis. Blood Cx showed e-coli and pseudomonas. Received IV abx. Echo showed EF 25-30%, RV low/normal. BP low and GDMT held. Discharged home off Lasix & hydralazine, weight 168 lbs.  Today he returns for AHF follow up. Overall feeling ***. Denies palpitations, CP, dizziness, edema, or PND/Orthopnea. *** SOB. Appetite ok. No fever or chills. Weight at home *** pounds. Taking all medications. ETOH, smoking ***  He has been going to the [facility] 3 times/week.     Medtronic device interrogation (personally reviewed): No AF/VT, stable thoracic impedance. ***  ECG (personally reviewed from 6/24): NSR, 1st degree AVB, right superior axis, IVCD 142 msec  Labs (1/19): K 3.8, creatinine 3.15 Labs (2/19): K 4.0, creatinine 3.23 Labs (3/19): digoxin 0.4, K 3.8, creatinine 3.19 Labs (6/19): digoxin 0.3, K 3.9, creatinine 3.12 Labs (9/19): K 3.9, creatinine 3.58, digoxin 0.3, LDL 91 Labs (3/20): K 4, creatinine 3.71 Labs (6/20): K 3.9, creatinine 3.94, LFTs normal, TSH normal Labs (8/20): K 3.7, creatinine 4.08, LFTs normal, TSH normal Labs (1/22): K 3.5, creatinine 4.34, LFTs normal Labs (3/22): K 3.4, creatinine 4.43 Labs (5/22): K 3.7, creatinine 4.59, LFTs normal, TSH normal Labs (9/22): K 3.5, creatinine 4.3, TSH normal Labs (8/23): K  3.8, creatinine 3.78 Labs (11/23): K 4.6, creatinine 4.09 Labs (2/24): K 4.5, creatinine 3.89 Labs (5/24): TSH normal, LFTs normal Labs (6/24): K 4, SCr 4.33  PMH: 1. CKD: Stage IV.  Follows with Miguel Roach.  2. Gout 3. Chronic systolic CHF: Nonischemic cardiomyopathy.  EF 40-45% in 2010.  Echo (6/16) with EF 20-25%, severe LV dilation, mild MR, mild AI, moderate LAE.  LHC/RHC (6/16) with nonobstructive CAD; mean RA 2, RV 48/1, PA 47/18, mean PCWP 12,  CI 3.2.  CPX (10/16) with peak VO2 13.1 (50% predicted), VE/VCO2 slope 39.3, RER 1.22 => moderate to severe functional limitation.  Echo (12/16) with EF 15-20%, severe LV dilation, diffuse hypokinesis, moderate AI, mild MR, normal RV size and systolic function. Medtronic ICD.  - CPX (11/17): Submaximal with RER 0.95, VE/VCO2 slope 27, peak VO2 18.2.  Suspect no more than mild HF limitation.  - Echo (1/18): EF 25%, diffuse hypokinesis, normal RV size and systolic function, mild AI.  - Echo (6/19): EF 25-30% with mild LV dilation and mild-moderate AI, normal RV size with mildly decreased systolic function, IVC normal.  - Echo (8/20): EF 35%, severe LV dilation, normal RV size and systolic function, mild-moderate AI.  - Echo (2/22): EF 25-30%, severe LV dilation, septal-lateral dyssynchrony, mild LVH, low normal RV function, mild-moderate AI.  - Echo (8/23): EF 25-30%, severe LV dysfunction, grade I DD, RV low/normal, mild AI, AAA 39 mm. - Echo today *** read pending 4. ACEI cough.  5. Pleural effusion: Thoracentesis on right in 8/16.  It was a transudate, likely due to CHF.  6. OSA: To start Bipap.  7. Esophageal duplication cyst.  8. High resolution CT chest 12/17: No evidence for interstitial lung disease.  9. Atrial fibrillation: Paroxysmal, 1 10 minute episode in 3/18.  10. VT: ICD discharge 3/18.  11. 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  - Zio patch in 6/20: occasional PVCs, low burden overall.  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   FH: Mother with "weak heart." Brother with sickle cell anemia.   ROS: All systems reviewed and negative except as per HPI.   Current Outpatient Medications  Medication Sig Dispense Refill   allopurinol (ZYLOPRIM) 100 MG tablet TAKE 1 TABLET BY MOUTH EVERY DAY 90 tablet 0   amiodarone (PACERONE) 100 MG tablet Take 1 tablet (100 mg total) by mouth daily. 90 tablet 3   carvedilol (COREG) 6.25 MG tablet TAKE 1.5 TABLETS BY MOUTH 2 TIMES DAILY  WITH A MEAL. 270 tablet 2   Fe Fum-Fe Poly-Vit C-Lactobac (FUSION) 65-65-25-30 MG CAPS Take 1 tablet by mouth daily.     furosemide (LASIX) 40 MG tablet Take 80 mg by mouth daily alternating with 40 mg daily 45 tablet 6   KLOR-CON M20 20 MEQ tablet TAKE 1 TABLET BY MOUTH EVERY DAY 90 tablet 3   potassium chloride (KLOR-CON) 20 MEQ packet Take 20 mEq by mouth every other day.     No current facility-administered medications for this visit.   There were no vitals taken for this visit.  Wt Readings from Last 3 Encounters:  04/12/23 70 kg (154 lb 6.4 oz)  02/19/23 70.9 kg (156 lb 6.4 oz)  12/11/22 73 kg (161 lb)   Physical Exam: General:  *** appearing.  No respiratory difficulty HEENT: normal Neck: supple. JVD *** cm. Carotids 2+ bilat; no bruits. No lymphadenopathy or thyromegaly appreciated. Cor: PMI nondisplaced. Regular rate & rhythm. No rubs, gallops or murmurs. Lungs: clear Abdomen: soft,  nontender, nondistended. No hepatosplenomegaly. No bruits or masses. Good bowel sounds. Extremities: no cyanosis, clubbing, rash, edema  Neuro: alert & oriented x 3, cranial nerves grossly intact. moves all 4 extremities w/o difficulty. Affect pleasant.   Assessment/Plan: 1. Chronic systolic CHF: Nonischemic cardiomyopathy.  CO preserved on RHC in 6/16, but 10/16 CPX showed moderate to severe functional impairment (seems worse than his symptoms would suggest).  However, repeat CPX in 11/17, though submaximal, suggested no more than mild HF limitation. No significant CAD with coronary angiography in 6/16.  No hx heavy ETOH or drug abuse. No marked HTN.  Negative SPEP/UPEP.  Possible prior myocarditis.  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  He now has Medtronic ICD.  Echo in 6/19 showed EF 25-30% (stable) with mild-moderate AI and mildly decreased RV systolic function.  Echo in 8/20 showed EF mildly higher at 35%, severe LV dilation, normal RV, mild-moderate AI.  Echo  in 2/22 showed EF back down to 25-30%, severe LV dilation, septal-lateral dyssynchrony, mild LVH, low normal RV function, mild-moderate AI.  Echo (8/23) showed EF 25-30%, RV low/normal, mild AI. NYHA class I-II.  He is not volume overloaded on exam or by OptiVol. - Continue Lasix 80 daily alternating with 40 daily + 20 KCL daily. BMET today.  - Increase Coreg back to 9.375 mg bid. *** - Off spironolactone with elevated creatinine.    - Off ACEI/ARB/Entresto with elevated creatinine.  - Off hydralazine with low BP during last admit.  - GFR too low for SGLT2 inhibitor - He is off digoxin with rising creatinine.  - IVCD 142 msec on ECG 06/16/22 => Gradually widening.  Getting towards the point where he may benefit from CRT upgrade but currently with NYHA I-II symptoms. Consider review with EP.   - Echo today, read pending *** 2. CKD: Stage IV. BMET today.   3. OSA: Bipap. 4. Gout: Gout controlled on allopurinol. 5. VT/PVCs: Appropriate ICD discharge in 3/18, no recurrence. He is now on amiodarone due to very frequent PVCs. Zio patch in 6/20 showed low burden of PVCs.  - Continue Coreg as above.  - Continue amiodarone 100 mg daily. Stable LFTs and TSH (5/24).  He will need regular eye exams.  6. Atrial fibrillation: Paroxysmal.  Very short episodes only noted by device interrogation, none recently.  He has had no prolonged or symptomatic atrial fibrillation (data for anticoagulating short runs of atrial fibrillation is not strong), so will hold off on anticoagulation.  - If he develops prolonged or symptomatic atrial fibrillation, would anticoagulate.   Follow up in 4 months with echo. ***  Signed, Alen Bleacher, NP  08/30/2023  [facility] [address] [facility] [place] [address] [phone] (office) [phone] (fax)

## 2023-09-04 DIAGNOSIS — I129 Hypertensive chronic kidney disease with stage 1 through stage 4 chronic kidney disease, or unspecified chronic kidney disease: Secondary | ICD-10-CM | POA: Diagnosis not present

## 2023-09-04 DIAGNOSIS — N185 Chronic kidney disease, stage 5: Secondary | ICD-10-CM | POA: Diagnosis not present

## 2023-09-04 LAB — LAB REPORT - SCANNED: EGFR: 12

## 2023-09-10 ENCOUNTER — Ambulatory Visit: Payer: Medicare PPO | Attending: Internal Medicine

## 2023-09-10 ENCOUNTER — Telehealth: Payer: Self-pay | Admitting: *Deleted

## 2023-09-10 DIAGNOSIS — I5042 Chronic combined systolic (congestive) and diastolic (congestive) heart failure: Secondary | ICD-10-CM

## 2023-09-10 DIAGNOSIS — Z95810 Presence of automatic (implantable) cardiac defibrillator: Secondary | ICD-10-CM | POA: Diagnosis not present

## 2023-09-12 ENCOUNTER — Ambulatory Visit (HOSPITAL_BASED_OUTPATIENT_CLINIC_OR_DEPARTMENT_OTHER)
Admission: RE | Admit: 2023-09-12 | Discharge: 2023-09-12 | Disposition: A | Discharge status: Home / Self Care | Payer: Medicare PPO | Source: Ambulatory Visit | Attending: Family Medicine | Admitting: Family Medicine

## 2023-09-12 ENCOUNTER — Encounter (HOSPITAL_COMMUNITY): Payer: Self-pay

## 2023-09-12 ENCOUNTER — Ambulatory Visit (HOSPITAL_COMMUNITY)
Admission: RE | Admit: 2023-09-12 | Discharge: 2023-09-12 | Disposition: A | Discharge status: Home / Self Care | Payer: Medicare PPO | Source: Ambulatory Visit | Attending: Internal Medicine | Admitting: Internal Medicine

## 2023-09-12 ENCOUNTER — Telehealth (HOSPITAL_COMMUNITY): Payer: Self-pay

## 2023-09-12 VITALS — BP 102/60 | HR 72 | Wt 149.0 lb

## 2023-09-12 DIAGNOSIS — Z8249 Family history of ischemic heart disease and other diseases of the circulatory system: Secondary | ICD-10-CM | POA: Diagnosis not present

## 2023-09-12 DIAGNOSIS — I5022 Chronic systolic (congestive) heart failure: Secondary | ICD-10-CM | POA: Diagnosis not present

## 2023-09-12 DIAGNOSIS — I480 Paroxysmal atrial fibrillation: Secondary | ICD-10-CM | POA: Insufficient documentation

## 2023-09-12 DIAGNOSIS — I493 Ventricular premature depolarization: Secondary | ICD-10-CM | POA: Diagnosis not present

## 2023-09-12 DIAGNOSIS — I120 Hypertensive chronic kidney disease with stage 5 chronic kidney disease or end stage renal disease: Secondary | ICD-10-CM | POA: Diagnosis not present

## 2023-09-12 DIAGNOSIS — I5042 Chronic combined systolic (congestive) and diastolic (congestive) heart failure: Secondary | ICD-10-CM | POA: Diagnosis not present

## 2023-09-12 DIAGNOSIS — I428 Other cardiomyopathies: Secondary | ICD-10-CM | POA: Diagnosis not present

## 2023-09-12 DIAGNOSIS — N185 Chronic kidney disease, stage 5: Secondary | ICD-10-CM | POA: Diagnosis not present

## 2023-09-12 DIAGNOSIS — G4733 Obstructive sleep apnea (adult) (pediatric): Secondary | ICD-10-CM

## 2023-09-12 DIAGNOSIS — I472 Ventricular tachycardia, unspecified: Secondary | ICD-10-CM | POA: Diagnosis not present

## 2023-09-12 DIAGNOSIS — M109 Gout, unspecified: Secondary | ICD-10-CM | POA: Insufficient documentation

## 2023-09-12 DIAGNOSIS — N184 Chronic kidney disease, stage 4 (severe): Secondary | ICD-10-CM | POA: Diagnosis not present

## 2023-09-12 DIAGNOSIS — Z79899 Other long term (current) drug therapy: Secondary | ICD-10-CM | POA: Insufficient documentation

## 2023-09-12 DIAGNOSIS — I5043 Acute on chronic combined systolic (congestive) and diastolic (congestive) heart failure: Secondary | ICD-10-CM | POA: Diagnosis not present

## 2023-09-12 DIAGNOSIS — D631 Anemia in chronic kidney disease: Secondary | ICD-10-CM | POA: Diagnosis not present

## 2023-09-12 DIAGNOSIS — N2581 Secondary hyperparathyroidism of renal origin: Secondary | ICD-10-CM | POA: Diagnosis not present

## 2023-09-12 LAB — ECHOCARDIOGRAM COMPLETE
Area-P 1/2: 3.7 cm2
Calc EF: 28.5 %
MV VTI: 4.37 cm2
P 1/2 time: 428 ms
S' Lateral: 7 cm
Single Plane A2C EF: 32.3 %
Single Plane A4C EF: 25.9 %

## 2023-09-12 NOTE — Patient Instructions (Signed)
STOP taking your Carvedilol.  Your physician recommends that you schedule a follow-up appointment in: 4 months ( March 2025) ** PLEASE CALL THE OFFICE IN [place] TO ARRANGE YOUR FOLLOW UP APPOINTMENT. **  If you have any questions or concerns before your next appointment please send [place] a message through [place] or call our office at [phone].    TO LEAVE A MESSAGE FOR THE NURSE SELECT OPTION 2, PLEASE LEAVE A MESSAGE INCLUDING: YOUR NAME DATE OF BIRTH CALL BACK NUMBER REASON FOR CALL**this is important as we prioritize the call backs  YOU WILL RECEIVE A CALL BACK THE SAME DAY AS LONG AS YOU CALL BEFORE 4:00 PM  At the [facility], you and your health needs are our priority. As part of our continuing mission to provide you with exceptional heart care, we have created designated Provider Care Teams. These Care Teams include your primary Cardiologist (physician) and Advanced Practice Providers (APPs- Physician Assistants and Nurse Practitioners) who all work together to provide you with the care you need, when you need it.   You may see any of the following providers on your designated Care Team at your next follow up: Dr Arvilla Meres Dr Marca Ancona Dr. Dorthula Nettles Dr. Clearnce Hasten Amy Filbert Schilder, NP Robbie Lis, [place] [facility] [place], [place] Brynda Peon, NP [place] Lee, NP Karle Plumber, PharmD   Please be sure to bring in all your medications bottles to every appointment.    Thank you for choosing [facility]

## 2023-09-12 NOTE — Progress Notes (Signed)
EPIC Encounter for ICM Monitoring  Patient Name: Miguel Roach is a 82 y.o. male Date: 09/12/2023 Primary Care Physican: Merri Brunette, MD Primary Cardiologist: Shirlee Latch Electrophysiologist: Ladona Ridgel Nephrologist: Robbie Lis Kidney - Allena Katz (every 3 months) 10/31/2022 Weight: 155-160 lbs 01/10/2023 Weight: 150-153 lbs 02/14/2023 Weight: 153 lbs 08/09/2023 Weight: 142 lbs     Transmission results reviewed.      Optivol thoracic impedance suggesting intermittent days with possible fluid accumulation within the last month.   Prescribed:  Furosemide 40 mg Take 2 tablets (80 mg total) by mouth every other day AND 1 tablet (40 mg total) every other day. Alternate 80 mg with 40 mg every other day.  03/21/2023 Dr Allena Katz, nephrologist recommends 40 mg daily Potassium 20 mEq take 1 tablet by mouth every other day per pt prescribed by nephrologist.     Labs: 04/12/2023 Creatinine 4.33, BUN 42, Potassium 4.0, Sodium 141, GFR 13 07/31/2022 Creatinine 4.71, BUN 48, Potassium 4.5, Sodium 143, GFR 12 A complete set of results can be found in Results Review.     Recommendations:  No changes.  Recommendations given at 11/27 HF clinic OV.     Follow-up plan: ICM clinic phone appointment on 10/15/2023.   91 day device clinic remote transmission 11/26/2023.      EP/Cardiology Office Visits:      Recall 01/10/2024 with Dr Shirlee Latch.  Recall 02/14/2024 with Dr Ladona Ridgel.       Copy of ICM check sent to Dr. Ladona Ridgel.   3 month ICM trend: 09/10/2023.    12-14 Month ICM trend:     Karie Soda, RN 09/12/2023 1:17 PM

## 2023-09-12 NOTE — Telephone Encounter (Addendum)
Pt aware, agreeable, and verbalized understanding   ----- Message from Marca Ancona sent at 09/12/2023 12:18 PM EST ----- EF 25-30%, stable.

## 2023-09-12 NOTE — Progress Notes (Signed)
  Echocardiogram 2D Echocardiogram has been performed.  Miguel Roach 09/12/2023, 8:51 AM

## 2023-09-21 DIAGNOSIS — I10 Essential (primary) hypertension: Secondary | ICD-10-CM | POA: Diagnosis not present

## 2023-09-21 NOTE — Progress Notes (Signed)
Remote ICD transmission.   

## 2023-09-26 DIAGNOSIS — D631 Anemia in chronic kidney disease: Secondary | ICD-10-CM | POA: Diagnosis not present

## 2023-09-26 DIAGNOSIS — M1A00X0 Idiopathic chronic gout, unspecified site, without tophus (tophi): Secondary | ICD-10-CM | POA: Diagnosis not present

## 2023-09-26 DIAGNOSIS — Z0000 Encounter for general adult medical examination without abnormal findings: Secondary | ICD-10-CM | POA: Diagnosis not present

## 2023-09-26 DIAGNOSIS — N186 End stage renal disease: Secondary | ICD-10-CM | POA: Diagnosis not present

## 2023-09-26 DIAGNOSIS — R636 Underweight: Secondary | ICD-10-CM | POA: Diagnosis not present

## 2023-09-26 DIAGNOSIS — I10 Essential (primary) hypertension: Secondary | ICD-10-CM | POA: Diagnosis not present

## 2023-09-26 DIAGNOSIS — Z23 Encounter for immunization: Secondary | ICD-10-CM | POA: Diagnosis not present

## 2023-09-26 DIAGNOSIS — D696 Thrombocytopenia, unspecified: Secondary | ICD-10-CM | POA: Diagnosis not present

## 2023-09-26 DIAGNOSIS — I5022 Chronic systolic (congestive) heart failure: Secondary | ICD-10-CM | POA: Diagnosis not present

## 2023-10-05 ENCOUNTER — Other Ambulatory Visit (HOSPITAL_COMMUNITY): Payer: Self-pay | Admitting: Cardiology

## 2023-10-15 ENCOUNTER — Ambulatory Visit: Payer: Medicare PPO | Attending: Internal Medicine

## 2023-10-15 DIAGNOSIS — I5022 Chronic systolic (congestive) heart failure: Secondary | ICD-10-CM | POA: Diagnosis not present

## 2023-10-15 DIAGNOSIS — Z95810 Presence of automatic (implantable) cardiac defibrillator: Secondary | ICD-10-CM | POA: Diagnosis not present

## 2023-10-19 ENCOUNTER — Telehealth: Payer: Self-pay

## 2023-10-19 NOTE — Telephone Encounter (Signed)
 Remote ICM transmission received.  Attempted call to patient regarding ICM remote transmission and left detailed message per DPR.  Left ICM phone number and advised to return call for any fluid symptoms or questions. Next ICM remote transmission scheduled 11/19/2023.

## 2023-11-13 DIAGNOSIS — B0223 Postherpetic polyneuropathy: Secondary | ICD-10-CM | POA: Diagnosis not present

## 2023-11-13 DIAGNOSIS — B028 Zoster with other complications: Secondary | ICD-10-CM | POA: Diagnosis not present

## 2023-11-19 ENCOUNTER — Ambulatory Visit: Payer: Medicare PPO | Attending: Internal Medicine

## 2023-11-19 DIAGNOSIS — Z95810 Presence of automatic (implantable) cardiac defibrillator: Secondary | ICD-10-CM

## 2023-11-19 DIAGNOSIS — I5022 Chronic systolic (congestive) heart failure: Secondary | ICD-10-CM

## 2023-11-26 ENCOUNTER — Ambulatory Visit (INDEPENDENT_AMBULATORY_CARE_PROVIDER_SITE_OTHER): Payer: Medicare PPO

## 2023-11-26 DIAGNOSIS — I5022 Chronic systolic (congestive) heart failure: Secondary | ICD-10-CM

## 2023-11-26 DIAGNOSIS — I428 Other cardiomyopathies: Secondary | ICD-10-CM

## 2023-11-26 LAB — CUP PACEART REMOTE DEVICE CHECK
Battery Remaining Longevity: 47 mo
Battery Voltage: 2.97 V
Brady Statistic RV Percent Paced: 0.23 %
Date Time Interrogation Session: 20250210043623
HighPow Impedance: 53 Ohm
Implantable Lead Connection Status: 753985
Implantable Lead Implant Date: 20170208
Implantable Lead Location: 753860
Implantable Lead Model: 6935
Implantable Pulse Generator Implant Date: 20170208
Lead Channel Impedance Value: 285 Ohm
Lead Channel Impedance Value: 361 Ohm
Lead Channel Pacing Threshold Amplitude: 1 V
Lead Channel Pacing Threshold Pulse Width: 0.4 ms
Lead Channel Sensing Intrinsic Amplitude: 13.25 mV
Lead Channel Sensing Intrinsic Amplitude: 13.25 mV
Lead Channel Setting Pacing Amplitude: 2 V
Lead Channel Setting Pacing Pulse Width: 0.4 ms
Lead Channel Setting Sensing Sensitivity: 0.3 mV
Zone Setting Status: 755011
Zone Setting Status: 755011

## 2023-12-02 ENCOUNTER — Encounter: Payer: Self-pay | Admitting: Internal Medicine

## 2023-12-05 DIAGNOSIS — N185 Chronic kidney disease, stage 5: Secondary | ICD-10-CM | POA: Diagnosis not present

## 2023-12-05 DIAGNOSIS — I129 Hypertensive chronic kidney disease with stage 1 through stage 4 chronic kidney disease, or unspecified chronic kidney disease: Secondary | ICD-10-CM | POA: Diagnosis not present

## 2023-12-11 DIAGNOSIS — I120 Hypertensive chronic kidney disease with stage 5 chronic kidney disease or end stage renal disease: Secondary | ICD-10-CM | POA: Diagnosis not present

## 2023-12-11 DIAGNOSIS — I129 Hypertensive chronic kidney disease with stage 1 through stage 4 chronic kidney disease, or unspecified chronic kidney disease: Secondary | ICD-10-CM | POA: Diagnosis not present

## 2023-12-11 DIAGNOSIS — N189 Chronic kidney disease, unspecified: Secondary | ICD-10-CM | POA: Diagnosis not present

## 2023-12-11 DIAGNOSIS — N2581 Secondary hyperparathyroidism of renal origin: Secondary | ICD-10-CM | POA: Diagnosis not present

## 2023-12-11 DIAGNOSIS — N185 Chronic kidney disease, stage 5: Secondary | ICD-10-CM | POA: Diagnosis not present

## 2023-12-11 DIAGNOSIS — D631 Anemia in chronic kidney disease: Secondary | ICD-10-CM | POA: Diagnosis not present

## 2023-12-24 ENCOUNTER — Telehealth: Payer: Self-pay

## 2023-12-24 ENCOUNTER — Ambulatory Visit: Payer: Medicare PPO | Attending: Internal Medicine

## 2023-12-24 DIAGNOSIS — I5022 Chronic systolic (congestive) heart failure: Secondary | ICD-10-CM | POA: Diagnosis not present

## 2023-12-24 DIAGNOSIS — Z95810 Presence of automatic (implantable) cardiac defibrillator: Secondary | ICD-10-CM | POA: Diagnosis not present

## 2023-12-24 NOTE — Telephone Encounter (Signed)
 Remote ICM transmission received.  Attempted call to patient regarding ICM remote transmission and left detailed message per DPR.  Left ICM phone number and advised to return call for any fluid symptoms or questions. Next ICM remote transmission scheduled 01/28/2024.

## 2023-12-24 NOTE — Progress Notes (Signed)
 EPIC Encounter for ICM Monitoring  Patient Name: Miguel Roach is a 83 y.o. male Date: 12/24/2023 Primary Care Physican: Merri Brunette, MD Primary Cardiologist: Shirlee Latch Electrophysiologist: Ladona Ridgel Nephrologist: Robbie Lis Kidney - Allena Katz (every 3 months) 10/31/2022 Weight: 155-160 lbs 01/10/2023 Weight: 150-153 lbs 02/14/2023 Weight: 153 lbs 09/12/2023 Office Weight: 149 lbs  12/24/2023 Weight: 145-148 lbs   Spoke with patient and heart failure questions reviewed.  Transmission results reviewed.  Pt asymptomatic for fluid accumulation.  Reports feeling well at this time and voices no complaints.  Wife is receiving radiation and chemo for cancer.     Optivol thoracic impedance suggesting normal fluid levels within the last month.   Prescribed:  Furosemide 40 mg Take 2 tablets (80 mg total) by mouth every other day AND 1 tablet (40 mg total) every other day. Alternate 80 mg with 40 mg every other day.  03/21/2023 Dr Allena Katz, nephrologist recommends 40 mg daily Potassium 20 mEq take 1 tablet by mouth every other day per pt prescribed by nephrologist.     Labs: 09/04/2023 Creatinine 4.61, BUN 47, Potassium 4.3, Sodium 141, GFR 12 04/12/2023 Creatinine 4.33, BUN 42, Potassium 4.0, Sodium 141, GFR 13 07/31/2022 Creatinine 4.71, BUN 48, Potassium 4.5, Sodium 143, GFR 12 A complete set of results can be found in Results Review.     Recommendations:   No changes and encouraged to call if experiencing any fluid symptoms.   Follow-up plan: ICM clinic phone appointment on 01/28/2024.  91 day device clinic remote transmission 02/25/2024.      EP/Cardiology Office Visits:    Recall 01/10/2024 with Dr Shirlee Latch.  Recall 02/14/2024 with Dr Ladona Ridgel.       Copy of ICM check sent to Dr. Ladona Ridgel.    3 month ICM trend: 12/24/2023.    12-14 Month ICM trend:     Karie Soda, RN 12/24/2023 12:40 PM

## 2024-01-03 NOTE — Progress Notes (Signed)
 Remote ICD transmission.

## 2024-01-23 ENCOUNTER — Other Ambulatory Visit (HOSPITAL_COMMUNITY): Payer: Self-pay

## 2024-01-23 MED ORDER — FUROSEMIDE 40 MG PO TABS: ORAL_TABLET | ORAL | 6 refills | Status: DC

## 2024-01-28 ENCOUNTER — Ambulatory Visit: Attending: Internal Medicine

## 2024-01-28 DIAGNOSIS — I5022 Chronic systolic (congestive) heart failure: Secondary | ICD-10-CM

## 2024-01-28 DIAGNOSIS — Z95810 Presence of automatic (implantable) cardiac defibrillator: Secondary | ICD-10-CM | POA: Diagnosis not present

## 2024-01-30 ENCOUNTER — Other Ambulatory Visit (HOSPITAL_COMMUNITY): Payer: Self-pay | Admitting: Cardiology

## 2024-01-30 MED ORDER — ALLOPURINOL 100 MG PO TABS
100.0000 mg | ORAL_TABLET | Freq: Every day | ORAL | 0 refills | Status: DC
Start: 2024-01-30 — End: 2024-04-25

## 2024-02-01 ENCOUNTER — Other Ambulatory Visit (HOSPITAL_COMMUNITY): Payer: Self-pay | Admitting: Cardiology

## 2024-02-01 ENCOUNTER — Telehealth: Payer: Self-pay

## 2024-02-01 NOTE — Telephone Encounter (Signed)
 Remote ICM transmission received.  Attempted call to patient regarding ICM remote transmission and left detailed message per DPR.  Left ICM phone number and advised to return call for any fluid symptoms or questions. Next ICM remote transmission scheduled 03/11/2024.

## 2024-02-01 NOTE — Progress Notes (Signed)
 EPIC Encounter for ICM Monitoring  Patient Name: Miguel Roach is a 83 y.o. male Date: 02/01/2024 Primary Care Physican: Imelda Man, MD Primary Cardiologist: Mitzie Anda Electrophysiologist: Carolynne Citron Nephrologist: Arne Bevel Kidney - Lydia Sams (every 3 months) 10/31/2022 Weight: 155-160 lbs 01/10/2023 Weight: 150-153 lbs 02/14/2023 Weight: 153 lbs 09/12/2023 Office Weight: 149 lbs  12/24/2023 Weight: 145-148 lbs   Attempted call to patient and unable to reach.  Left detailed message per DPR regarding transmission.  Transmission results reviewed.     Optivol thoracic impedance suggesting normal fluid levels within the last month.   Prescribed:  Furosemide  40 mg Take 2 tablets (80 mg total) by mouth every other day AND 1 tablet (40 mg total) every other day. Alternate 80 mg with 40 mg every other day.  03/21/2023 Dr Lydia Sams, nephrologist recommends 40 mg daily Potassium 20 mEq take 1 tablet by mouth every other day per pt prescribed by nephrologist.     Labs: 09/04/2023 Creatinine 4.61, BUN 47, Potassium 4.3, Sodium 141, GFR 12 04/12/2023 Creatinine 4.33, BUN 42, Potassium 4.0, Sodium 141, GFR 13 07/31/2022 Creatinine 4.71, BUN 48, Potassium 4.5, Sodium 143, GFR 12 A complete set of results can be found in Results Review.     Recommendations:   Left voice mail with ICM number and encouraged to call if experiencing any fluid symptoms.   Follow-up plan: ICM clinic phone appointment on 03/11/2024.  91 day device clinic remote transmission 02/25/2024.      EP/Cardiology Office Visits:    02/07/2024 with Dr Mitzie Anda.  Recall 02/14/2024 with Dr Carolynne Citron.       Copy of ICM check sent to Dr. Carolynne Citron.   3 month ICM trend: 01/28/2024.    12-14 Month ICM trend:     Almyra Jain, RN 02/01/2024 2:44 PM

## 2024-02-06 ENCOUNTER — Telehealth (HOSPITAL_COMMUNITY): Payer: Self-pay | Admitting: Cardiology

## 2024-02-06 NOTE — Telephone Encounter (Signed)
 Called to confirm/remind patient of their appointment at the [facility] on 02/06/2024.   Appointment:   [x] Confirmed  [] Left mess   [] No answer/No voice mail  [] VM Full/unable to leave message  [] Phone not in service  Patient reminded to bring all medications and/or complete list.  Confirmed patient has transportation. Gave directions, instructed to utilize valet parking.

## 2024-02-07 ENCOUNTER — Ambulatory Visit (HOSPITAL_COMMUNITY)
Admission: RE | Admit: 2024-02-07 | Discharge: 2024-02-07 | Disposition: A | Discharge status: Home / Self Care | Source: Ambulatory Visit | Attending: Cardiology | Admitting: Cardiology

## 2024-02-07 VITALS — BP 110/60 | HR 64 | Wt 145.8 lb

## 2024-02-07 DIAGNOSIS — I428 Other cardiomyopathies: Secondary | ICD-10-CM | POA: Diagnosis not present

## 2024-02-07 DIAGNOSIS — M109 Gout, unspecified: Secondary | ICD-10-CM | POA: Insufficient documentation

## 2024-02-07 DIAGNOSIS — N184 Chronic kidney disease, stage 4 (severe): Secondary | ICD-10-CM | POA: Insufficient documentation

## 2024-02-07 DIAGNOSIS — I2510 Atherosclerotic heart disease of native coronary artery without angina pectoris: Secondary | ICD-10-CM | POA: Insufficient documentation

## 2024-02-07 DIAGNOSIS — Z79899 Other long term (current) drug therapy: Secondary | ICD-10-CM | POA: Diagnosis not present

## 2024-02-07 DIAGNOSIS — I493 Ventricular premature depolarization: Secondary | ICD-10-CM | POA: Insufficient documentation

## 2024-02-07 DIAGNOSIS — I440 Atrioventricular block, first degree: Secondary | ICD-10-CM | POA: Diagnosis not present

## 2024-02-07 DIAGNOSIS — I5022 Chronic systolic (congestive) heart failure: Secondary | ICD-10-CM | POA: Insufficient documentation

## 2024-02-07 DIAGNOSIS — G4733 Obstructive sleep apnea (adult) (pediatric): Secondary | ICD-10-CM | POA: Diagnosis not present

## 2024-02-07 DIAGNOSIS — Z95810 Presence of automatic (implantable) cardiac defibrillator: Secondary | ICD-10-CM | POA: Diagnosis not present

## 2024-02-07 LAB — COMPREHENSIVE METABOLIC PANEL WITH GFR
ALT: 9 U/L (ref 0–44)
AST: 13 U/L — ABNORMAL LOW (ref 15–41)
Albumin: 3.7 g/dL (ref 3.5–5.0)
Alkaline Phosphatase: 58 U/L (ref 38–126)
Anion gap: 10 (ref 5–15)
BUN: 49 mg/dL — ABNORMAL HIGH (ref 8–23)
CO2: 23 mmol/L (ref 22–32)
Calcium: 9.3 mg/dL (ref 8.9–10.3)
Chloride: 110 mmol/L (ref 98–111)
Creatinine, Ser: 4.77 mg/dL — ABNORMAL HIGH (ref 0.61–1.24)
GFR, Estimated: 12 mL/min — ABNORMAL LOW (ref >60)
Glucose, Bld: 77 mg/dL (ref 70–99)
Potassium: 4.3 mmol/L (ref 3.5–5.1)
Sodium: 143 mmol/L (ref 135–145)
Total Bilirubin: 0.8 mg/dL (ref 0.0–1.2)
Total Protein: 6.2 g/dL — ABNORMAL LOW (ref 6.5–8.1)

## 2024-02-07 LAB — TSH: TSH: 2.588 u[IU]/mL (ref 0.350–4.500)

## 2024-02-07 LAB — BRAIN NATRIURETIC PEPTIDE: B Natriuretic Peptide: 892.2 pg/mL — ABNORMAL HIGH (ref 0.0–100.0)

## 2024-02-07 MED ORDER — METOPROLOL SUCCINATE ER 25 MG PO TB24: 12.5000 mg | ORAL_TABLET | Freq: Every day | ORAL | 3 refills | Status: DC

## 2024-02-07 NOTE — Progress Notes (Signed)
 ID:  Miguel Roach, Tiedt September 14, 1941, MRN [id number]   Provider location: [facility] Type of Visit: Established patient   PCP:  Imelda Man, MD  HF Cardiologist:  Dr. Mitzie Anda  Chief complaint: CHF   HPI: Miguel Roach is a 83 y.o. male who has a history of chronic systolic CHF/nonischemic cardiomyopathy and CKD. He actually had an echo back in 2010 with EF 40-45% at the time.  He says that this really was not followed up upon by a cardiologist and that he did well for a number of years after that. In 6/16, he was admitted with acute systolic CHF.  Echo showed EF down to 20-25%.  Cardiac cath showed nonobstructive CAD.  His Lasix  has been gradually increased.  This has brought his weight down and improved his breathing, but creatinine has risen. He had been on olmesartan  but this was stopped because he thought it was causing cough.  He was then put on losartan , but this was stopped with rise in creatinine. Also intolerant to Bidil with orthostatic symptoms.  He was started on digoxin .  Echo in 12/16 showed persistently low EF, 15-20%.  Medtronic ICD was placed in 2/17.  Repeat echo 1/18 showed EF 25% with diffuse hypokinesis.   In 3/18, he had an episode of about 10 minutes atrial fibrillation noted on his ICD.  He also had 1 episode of VT terminated by an appropriate discharge.  He had push-mowed his grass and was very fatigued when he had the shock.  Since then, he has had no further arrhythmias.   Echo in 6/19 showed EF 25-30% with mild LV dilation and mild-moderate AI, normal RV size with mildly decreased systolic function, IVC normal.   Admitted 1/27 - 11/17/2018 with hypotension and found to have UTI/sepsis. Treated on ABX and IVF. Eventually transitioned back to po diuretics with HF team following. 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 Noted to have increased NSVT and PVCs, started on amiodarone .  Zio patch in 6/20 showed occasional PVCs, burden low.   Echo in 8/20  showed EF 35%, severe LV dilation, normal RV size and systolic function, mild-moderate AI.   Echo in 2/22 showed EF 25-30%, severe LV dilation, septal-lateral dyssynchrony, mild LVH, low normal RV function, mild-moderate AI.   Admitted 8/23 w/ sepsis. Blood Cx showed e-coli and pseudomonas. Received IV abx. Echo showed EF 25-30%, RV low/normal. BP low and GDMT held. Discharged home off Lasix  & hydralazine , weight 168 lbs.  Echo in 11/24 showed EF 25-30%, severe LV dilation, normal RV.   Today he returns for followup of CHF.  He is off Coreg  due to low BP.  BP good today.  No lightheadedness or syncope. Weight down 4 lbs. No exertional dyspnea or chest pain.  No orthopnea/PND.  No palpitations. His wife has been diagnosed with a brain tumor (sarcoma) and is undergoing chemotherapy.   Medtronic device interrogation (personally reviewed): No AF/VT, stable thoracic impedance.   ECG (personally reviewed from 6/24): NSR, 1st degree AVB, IVCD 134 msec  Labs (3/20): K 4, creatinine 3.71 Labs (6/20): K 3.9, creatinine 3.94, LFTs normal, TSH normal Labs (8/20): K 3.7, creatinine 4.08, LFTs normal, TSH normal Labs (1/22): K 3.5, creatinine 4.34, LFTs normal Labs (3/22): K 3.4, creatinine 4.43 Labs (5/22): K 3.7, creatinine 4.59, LFTs normal, TSH normal Labs (9/22): K 3.5, creatinine 4.3, TSH normal Labs (8/23): K 3.8, creatinine 3.78 Labs (11/23): K 4.6, creatinine 4.09 Labs (2/24): K 4.5, creatinine 3.89 Labs (5/24):  TSH normal, LFTs normal Labs (6/24): K 4, SCr 4.33 Labs (11/24): creatinine 4.61  PMH: 1. CKD: Stage IV.  Follows with Dr Lydia Sams.  2. Gout 3. Chronic systolic CHF: Nonischemic cardiomyopathy.  EF 40-45% in 2010.  Echo (6/16) with EF 20-25%, severe LV dilation, mild MR, mild AI, moderate LAE.  LHC/RHC (6/16) with nonobstructive CAD; mean RA 2, RV 48/1, PA 47/18, mean PCWP 12, CI 3.2.  CPX (10/16) with peak VO2 13.1 (50% predicted), VE/VCO2 slope 39.3, RER 1.22 => moderate to severe  functional limitation.  Echo (12/16) with EF 15-20%, severe LV dilation, diffuse hypokinesis, moderate AI, mild MR, normal RV size and systolic function. Medtronic ICD.  - CPX (11/17): Submaximal with RER 0.95, VE/VCO2 slope 27, peak VO2 18.2.  Suspect no more than mild HF limitation.  - Echo (1/18): EF 25%, diffuse hypokinesis, normal RV size and systolic function, mild AI.  - Echo (6/19): EF 25-30% with mild LV dilation and mild-moderate AI, normal RV size with mildly decreased systolic function, IVC normal.  - Echo (8/20): EF 35%, severe LV dilation, normal RV size and systolic function, mild-moderate AI.  - Echo (2/22): EF 25-30%, severe LV dilation, septal-lateral dyssynchrony, mild LVH, low normal RV function, mild-moderate AI.  - Echo (8/23): EF 25-30%, severe LV dysfunction, grade I DD, RV low/normal, mild AI, AAA 39 mm. - Echo (11/24): EF 25-30%, severe LV dilation, normal RV. 4. ACEI cough.  5. Pleural effusion: Thoracentesis on right in 8/16.  It was a transudate, likely due to CHF.  6. OSA: To start Bipap.  7. Esophageal duplication cyst.  8. High resolution CT chest 12/17: No evidence for interstitial lung disease.  9. Atrial fibrillation: Paroxysmal, 1 10 minute episode in 3/18.  10. VT: ICD discharge 3/18.  11. PVCs/NSVT: On amiodarone .  - Zio patch in 6/20: occasional PVCs, low burden overall.  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   FH: Mother with "weak heart." Brother with sickle cell anemia.   ROS: All systems reviewed and negative except as per HPI.   Current Outpatient Medications  Medication Sig Dispense Refill   allopurinol  (ZYLOPRIM ) 100 MG tablet Take 1 tablet (100 mg total) by mouth daily. 90 tablet 0   amiodarone  (PACERONE ) 100 MG tablet TAKE 1 TABLET BY MOUTH EVERY DAY 90 tablet 1   Fe Fum-Fe Poly-Vit C-Lactobac (FUSION) 65-65-25-30 MG CAPS Take 1 tablet by mouth daily.     furosemide  (LASIX ) 40 MG tablet Take 80 mg by mouth daily  alternating with 40 mg daily 45 tablet 6   potassium chloride  (KLOR-CON ) 20 MEQ packet Take 20 mEq by mouth every other day.     metoprolol  succinate (TOPROL -XL) 25 MG 24 hr tablet Take 0.5 tablets (12.5 mg total) by mouth at bedtime. Take with or immediately following a meal. 45 tablet 3   No current facility-administered medications for this encounter.   BP 110/60   Pulse 64   Wt 66.1 kg (145 lb 12.8 oz)   SpO2 99%   BMI 20.33 kg/m   Wt Readings from Last 3 Encounters:  02/07/24 66.1 kg (145 lb 12.8 oz)  09/12/23 67.6 kg (149 lb)  04/12/23 70 kg (154 lb 6.4 oz)  General: NAD Neck: No JVD, no thyromegaly or thyroid  nodule.  Lungs: Clear to auscultation bilaterally with normal respiratory effort. CV: Nondisplaced PMI.  Heart regular S1/S2, no S3/S4, no murmur.  No peripheral edema.  No carotid bruit.  Normal pedal pulses.  Abdomen: Soft, nontender, no hepatosplenomegaly, no distention.  Skin: Intact without lesions or rashes.  Neurologic: Alert and oriented x 3.  Psych: Normal affect. Extremities: No clubbing or cyanosis.  HEENT: Normal.   Assessment/Plan: 1. Chronic systolic CHF: Nonischemic cardiomyopathy.  CO preserved on RHC in 6/16, but 10/16 CPX showed moderate to severe functional impairment (seems worse than his symptoms would suggest).  However, repeat CPX in 11/17, though submaximal, suggested no more than mild HF limitation. No significant CAD with coronary angiography in 6/16.  No hx heavy ETOH or drug abuse. No marked HTN.  Negative SPEP/UPEP.  Possible prior myocarditis.  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  He now has Medtronic ICD.  Echo in 6/19 showed EF 25-30% (stable) with mild-moderate AI and mildly decreased RV systolic function.  Echo in 8/20 showed EF mildly higher at 35%, severe LV dilation, normal RV, mild-moderate AI.  Echo in 2/22 showed EF back down to 25-30%, severe LV dilation, septal-lateral dyssynchrony, mild LVH, low  normal RV function, mild-moderate AI.  Echo (8/23) showed EF 25-30%, RV low/normal, mild AI. Echo in 11/24 showed EF 25-30%, severe LV dilation, normal RV. NYHA class I-II.  He is not volume overloaded by exam or Optivol.  - Continue Lasix  80 daily alternating with 40 daily + 20 KCL daily. BMET/BNP today.  - Off Coreg  with soft BP per nephrology.  I will try him on Toprol  XL 12.5 mg at bedtime.  I think this will have less BP effect.  - Off spironolactone  / ACEI/ARB/Entresto / digoxin  with elevated creatinine.    - Off hydralazine  with low BP  - GFR too low for SGLT2 inhibitor - IVCD 134 msec on ECG, not wide enough to suggest benefit with CRT.   2. CKD: Stage IV.  - BMET today.  3. OSA: Bipap. 4. Gout: Gout controlled on allopurinol . 5. VT/PVCs: Appropriate ICD discharge in 3/18, no recurrence. He is now on amiodarone  due to very frequent PVCs. Zio patch in 6/20 showed low burden of PVCs.  - Adding Toprol  XL as above.  - Continue amiodarone  100 mg daily. Check LFTs and TSH.  He will need regular eye exams.    Follow up in 4 months  I spent 31 minutes reviewing records, interviewing/examining patient, and managing orders.   Signed, Peder Bourdon, MD  02/07/2024  [facility] [address] [facility] [place] [address] [phone] (office) [phone] (fax)

## 2024-02-07 NOTE — Patient Instructions (Signed)
 Medication Changes:  START: METOPROLOL  SUCCINATE 12.5MG  ONCE DAILY AT BEDTIME   Lab Work:  Labs done today, your results will be available in MyChart, we will contact you for abnormal readings.  Follow-Up in: 4 MONTHS AS SCHEDULED WITH APP   At the [facility], you and your health needs are our priority. We have a designated team specialized in the treatment of Heart Failure. This Care Team includes your primary Heart Failure Specialized Cardiologist (physician), Advanced Practice Providers (APPs- Physician Assistants and Nurse Practitioners), and Pharmacist who all work together to provide you with the care you need, when you need it.   You may see any of the following providers on your designated Care Team at your next follow up:  Dr. Jules Oar Dr. Peder Bourdon Dr. Alwin Baars Dr. Judyth Nunnery Nieves Bars, NP Ruddy Corral, [place] [facility] [place], [place] Dennise Fitz, NP [place] Lee, NP Luster Salters, PharmD   Please be sure to bring in all your medications bottles to every appointment.   Need to Contact Us :  If you have any questions or concerns before your next appointment please send us  a message through [place] or call our office at [phone].    TO LEAVE A MESSAGE FOR THE NURSE SELECT OPTION 2, PLEASE LEAVE A MESSAGE INCLUDING: YOUR NAME DATE OF BIRTH CALL BACK NUMBER REASON FOR CALL**this is important as we prioritize the call backs  YOU WILL RECEIVE A CALL BACK THE SAME DAY AS LONG AS YOU CALL BEFORE 4:00 P

## 2024-02-18 ENCOUNTER — Encounter: Payer: Self-pay | Admitting: Adult Health

## 2024-02-21 ENCOUNTER — Encounter: Admitting: Physician Assistant

## 2024-02-25 ENCOUNTER — Ambulatory Visit (INDEPENDENT_AMBULATORY_CARE_PROVIDER_SITE_OTHER): Payer: Medicare PPO

## 2024-02-25 DIAGNOSIS — I5022 Chronic systolic (congestive) heart failure: Secondary | ICD-10-CM

## 2024-02-25 NOTE — Progress Notes (Unsigned)
  Electrophysiology Office Note:   ID:  Tyreq, Balough [birth year], MRN [id number]  Primary Cardiologist: None Electrophysiologist: Manya Sells, MD  {Click to update primary MD,subspecialty MD or APP then REFRESH:1}    History of Present Illness:   CARI VOSHELL is a 83 y.o. male with h/o NICM, chronic CHF, ICD, HTN, AF, and CKD IV seen today for routine electrophysiology followup.   Since last being seen in our clinic the patient reports doing ***.  he denies chest pain, palpitations, dyspnea, PND, orthopnea, nausea, vomiting, dizziness, syncope, edema, weight gain, or early satiety.   Review of systems complete and found to be negative unless listed in HPI.   EP Information / Studies Reviewed:    EKG is ordered today. Personal review as below.       ICD Interrogation-  reviewed in detail today,  See PACEART report.  Arrhythmia/Device History Medtronic-Optivol HF MD--Dr Mitzie AndaICM clinic**  04/05/2020  Dpr on file. Ok to speak to spouse Rhae Cease. Sulema Endo or sister Mellie Sprinkle Boozer. Ok to leave detailed message on [phone].   Physical Exam:   VS:  There were no vitals taken for this visit.   Wt Readings from Last 3 Encounters:  02/07/24 145 lb 12.8 oz (66.1 kg)  09/12/23 149 lb (67.6 kg)  04/12/23 154 lb 6.4 oz (70 kg)     GEN: No acute distress *** NECK: No JVD; No carotid bruits CARDIAC: {EPRHYTHM:28826}, no murmurs, rubs, gallops RESPIRATORY:  Clear to auscultation without rales, wheezing or rhonchi  ABDOMEN: Soft, non-tender, non-distended EXTREMITIES:  {EDEMA LEVEL:28147::"No"} edema; No deformity   ASSESSMENT AND PLAN:    Chronic systolic CHF  s/p Medtronic single chamber ICD  euvolemic today Stable on an appropriate medical regimen Normal ICD function See Pace Art report No changes today  VT *** Further Continue amiodarone  Surveillance labs stable 01/2024  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

## 2024-02-26 ENCOUNTER — Ambulatory Visit: Attending: Student | Admitting: Student

## 2024-02-26 ENCOUNTER — Encounter: Payer: Self-pay | Admitting: Student

## 2024-02-26 VITALS — BP 94/48 | HR 64 | Ht 71.0 in | Wt 144.0 lb

## 2024-02-26 DIAGNOSIS — I428 Other cardiomyopathies: Secondary | ICD-10-CM | POA: Diagnosis not present

## 2024-02-26 DIAGNOSIS — I5022 Chronic systolic (congestive) heart failure: Secondary | ICD-10-CM

## 2024-02-26 DIAGNOSIS — N184 Chronic kidney disease, stage 4 (severe): Secondary | ICD-10-CM | POA: Diagnosis not present

## 2024-02-26 DIAGNOSIS — Z95810 Presence of automatic (implantable) cardiac defibrillator: Secondary | ICD-10-CM | POA: Diagnosis not present

## 2024-02-26 LAB — CUP PACEART INCLINIC DEVICE CHECK
Battery Remaining Longevity: 43 mo
Battery Voltage: 2.97 V
Brady Statistic RV Percent Paced: 0.84 %
Date Time Interrogation Session: 20250513104634
HighPow Impedance: 54 Ohm
Implantable Lead Connection Status: 753985
Implantable Lead Implant Date: 20170208
Implantable Lead Location: 753860
Implantable Lead Model: 6935
Implantable Pulse Generator Implant Date: 20170208
Lead Channel Impedance Value: 304 Ohm
Lead Channel Impedance Value: 361 Ohm
Lead Channel Pacing Threshold Amplitude: 1 V
Lead Channel Pacing Threshold Pulse Width: 0.4 ms
Lead Channel Sensing Intrinsic Amplitude: 12.875 mV
Lead Channel Sensing Intrinsic Amplitude: 9.125 mV
Lead Channel Setting Pacing Amplitude: 2 V
Lead Channel Setting Pacing Pulse Width: 0.4 ms
Lead Channel Setting Sensing Sensitivity: 0.3 mV
Zone Setting Status: 755011
Zone Setting Status: 755011

## 2024-02-26 LAB — CUP PACEART REMOTE DEVICE CHECK
Battery Remaining Longevity: 43 mo
Battery Voltage: 2.97 V
Brady Statistic RV Percent Paced: 0.84 %
Date Time Interrogation Session: 20250512043823
HighPow Impedance: 50 Ohm
Implantable Lead Connection Status: 753985
Implantable Lead Implant Date: 20170208
Implantable Lead Location: 753860
Implantable Lead Model: 6935
Implantable Pulse Generator Implant Date: 20170208
Lead Channel Impedance Value: 304 Ohm
Lead Channel Impedance Value: 342 Ohm
Lead Channel Pacing Threshold Amplitude: 1 V
Lead Channel Pacing Threshold Pulse Width: 0.4 ms
Lead Channel Sensing Intrinsic Amplitude: 9.25 mV
Lead Channel Sensing Intrinsic Amplitude: 9.25 mV
Lead Channel Setting Pacing Amplitude: 2 V
Lead Channel Setting Pacing Pulse Width: 0.4 ms
Lead Channel Setting Sensing Sensitivity: 0.3 mV
Zone Setting Status: 755011
Zone Setting Status: 755011

## 2024-02-26 NOTE — Patient Instructions (Signed)
 Medication Instructions:  Your physician recommends that you continue on your current medications as directed. Please refer to the Current Medication list given to you today.  *If you need a refill on your cardiac medications before your next appointment, please call your pharmacy*  Lab Work: None ordered If you have labs (blood work) drawn today and your tests are completely normal, you will receive your results only by: MyChart Message (if you have MyChart) OR A paper copy in the mail If you have any lab test that is abnormal or we need to change your treatment, we will call you to review the results.  Follow-Up: At [facility], you and your health needs are our priority.  As part of our continuing mission to provide you with exceptional heart care, our providers are all part of one team.  This team includes your primary Cardiologist (physician) and Advanced Practice Providers or APPs (Physician Assistants and Nurse Practitioners) who all work together to provide you with the care you need, when you need it.  Your next appointment:   1 year(s)  Provider:   You will see one of the following Advanced Practice Providers on your designated Care Team:   Mertha Abrahams, [place] [address] [place], [place] Creighton Doffing, NP

## 2024-03-03 ENCOUNTER — Ambulatory Visit: Payer: Self-pay | Admitting: Internal Medicine

## 2024-03-05 DIAGNOSIS — N185 Chronic kidney disease, stage 5: Secondary | ICD-10-CM | POA: Diagnosis not present

## 2024-03-05 DIAGNOSIS — N2581 Secondary hyperparathyroidism of renal origin: Secondary | ICD-10-CM | POA: Diagnosis not present

## 2024-03-05 DIAGNOSIS — D631 Anemia in chronic kidney disease: Secondary | ICD-10-CM | POA: Diagnosis not present

## 2024-03-05 DIAGNOSIS — I120 Hypertensive chronic kidney disease with stage 5 chronic kidney disease or end stage renal disease: Secondary | ICD-10-CM | POA: Diagnosis not present

## 2024-03-11 ENCOUNTER — Ambulatory Visit: Attending: Internal Medicine

## 2024-03-11 DIAGNOSIS — Z95810 Presence of automatic (implantable) cardiac defibrillator: Secondary | ICD-10-CM | POA: Diagnosis not present

## 2024-03-11 DIAGNOSIS — I5022 Chronic systolic (congestive) heart failure: Secondary | ICD-10-CM | POA: Diagnosis not present

## 2024-03-14 NOTE — Progress Notes (Signed)
 EPIC Encounter for ICM Monitoring  Patient Name: Miguel Roach is a 83 y.o. male Date: 03/14/2024 Primary Care Physican: Imelda Man, MD Primary Cardiologist: Mitzie Anda Electrophysiologist: Carolynne Citron Nephrologist: Arne Bevel Kidney - Lydia Sams (every 3 months) 10/31/2022 Weight: 155-160 lbs 01/10/2023 Weight: 150-153 lbs 02/14/2023 Weight: 153 lbs 09/12/2023 Office Weight: 149 lbs  12/24/2023 Weight: 145-148 lbs 02/26/2024 Office Weight: 144 lbs   Spoke with patient and heart failure questions reviewed.  Transmission results reviewed.  Pt asymptomatic for fluid accumulation.  Wife is still battling brain cancer and currently hospitalized after fall.     Optivol thoracic impedance suggesting intermittent days with possible fluid accumulation within from 5/5-5/17.   Prescribed:  Furosemide  40 mg Take 2 tablets (80 mg total) by mouth every other day AND 1 tablet (40 mg total) every other day. Alternate 80 mg with 40 mg every other day.   Potassium 20 mEq take 1 tablet by mouth every other day per pt prescribed by nephrologist.     Labs: 02/07/2024 Creatinine 4.77, BUN 49, Potassium 4.3, Sodium 143, GFR 12 A complete set of results can be found in Results Review.     Recommendations:  No changes and encouraged to call if experiencing any fluid symptoms.   Follow-up plan: ICM clinic phone appointment on 04/28/2024.  91 day device clinic remote transmission 05/26/2024.      EP/Cardiology Office Visits:  06/09/2024 with Dr Mitzie Anda.  Recall 02/20/2025 with Dr Carolynne Citron or EP APP.       Copy of ICM check sent to Dr. Carolynne Citron.   3 month ICM trend: 03/11/2024.    12-14 Month ICM trend:     Almyra Jain, RN 03/14/2024 9:28 AM

## 2024-04-14 NOTE — Addendum Note (Signed)
 Addended by: TAWNI DRILLING D on: 04/14/2024 01:04 PM   Modules accepted: Orders

## 2024-04-14 NOTE — Progress Notes (Signed)
 Remote ICD transmission.

## 2024-04-25 ENCOUNTER — Other Ambulatory Visit (HOSPITAL_COMMUNITY): Payer: Self-pay | Admitting: Cardiology

## 2024-04-28 ENCOUNTER — Ambulatory Visit: Attending: Internal Medicine

## 2024-04-28 DIAGNOSIS — I5022 Chronic systolic (congestive) heart failure: Secondary | ICD-10-CM | POA: Diagnosis not present

## 2024-04-28 DIAGNOSIS — Z95810 Presence of automatic (implantable) cardiac defibrillator: Secondary | ICD-10-CM

## 2024-05-05 NOTE — Progress Notes (Signed)
 EPIC Encounter for ICM Monitoring  Patient Name: Miguel Roach is a 83 y.o. male Date: 05/05/2024 Primary Care Physican: [name], MD Primary Cardiologist: [name] Electrophysiologist: [name] Nephrologist: [name] Kidney - [name] (every 3 months) 10/31/2022 Weight: 155-160 lbs 01/10/2023 Weight: 150-153 lbs 02/14/2023 Weight: 153 lbs 09/12/2023 Office Weight: 149 lbs  12/24/2023 Weight: 145-148 lbs 02/26/2024 Office Weight: 144 lbs   Transmission results reviewed.       Optivol thoracic impedance suggesting normal fluid levels within the last month   Prescribed:  Furosemide  40 mg Take 2 tablets (80 mg total) by mouth every other day AND 1 tablet (40 mg total) every other day. Alternate 80 mg with 40 mg every other day.   Potassium 20 mEq take 1 tablet by mouth every other day per pt prescribed by nephrologist.     Labs: 02/07/2024 Creatinine 4.77, BUN 49, Potassium 4.3, Sodium 143, GFR 12 A complete set of results can be found in Results Review.     Recommendations:   No changes.     Follow-up plan: ICM clinic phone appointment on 06/02/2024.  91 day device clinic remote transmission 05/26/2024.      EP/Cardiology Office Visits:  06/09/2024 with Dr [name].  Recall 02/20/2025 with Dr [name] or EP APP.       Copy of ICM check sent to Dr. [name].   3 month ICM trend: 04/28/2024.    12-14 Month ICM trend:     [name], RN 05/05/2024 10:59 AM

## 2024-05-26 ENCOUNTER — Ambulatory Visit: Payer: Medicare PPO

## 2024-05-26 DIAGNOSIS — I5022 Chronic systolic (congestive) heart failure: Secondary | ICD-10-CM

## 2024-05-27 LAB — CUP PACEART REMOTE DEVICE CHECK
Battery Remaining Longevity: 42 mo
Battery Voltage: 2.97 V
Brady Statistic RV Percent Paced: 0.49 %
Date Time Interrogation Session: 20250811022723
HighPow Impedance: 59 Ohm
Implantable Lead Connection Status: 753985
Implantable Lead Implant Date: 20170208
Implantable Lead Location: 753860
Implantable Lead Model: 6935
Implantable Pulse Generator Implant Date: 20170208
Lead Channel Impedance Value: 304 Ohm
Lead Channel Impedance Value: 399 Ohm
Lead Channel Pacing Threshold Amplitude: 1 V
Lead Channel Pacing Threshold Pulse Width: 0.4 ms
Lead Channel Sensing Intrinsic Amplitude: 10 mV
Lead Channel Sensing Intrinsic Amplitude: 10 mV
Lead Channel Setting Pacing Amplitude: 2 V
Lead Channel Setting Pacing Pulse Width: 0.4 ms
Lead Channel Setting Sensing Sensitivity: 0.3 mV
Zone Setting Status: 755011
Zone Setting Status: 755011

## 2024-05-28 ENCOUNTER — Ambulatory Visit: Payer: Self-pay | Admitting: Internal Medicine

## 2024-06-01 ENCOUNTER — Other Ambulatory Visit (HOSPITAL_COMMUNITY): Payer: Self-pay | Admitting: Cardiology

## 2024-06-02 ENCOUNTER — Ambulatory Visit: Attending: Internal Medicine

## 2024-06-02 DIAGNOSIS — Z95810 Presence of automatic (implantable) cardiac defibrillator: Secondary | ICD-10-CM

## 2024-06-02 DIAGNOSIS — I5022 Chronic systolic (congestive) heart failure: Secondary | ICD-10-CM

## 2024-06-06 ENCOUNTER — Telehealth (HOSPITAL_COMMUNITY): Payer: Self-pay

## 2024-06-06 NOTE — Progress Notes (Signed)
 EPIC Encounter for ICM Monitoring  Patient Name: Miguel Roach is a 83 y.o. male Date: 06/06/2024 Primary Care Physican: [name], MD Primary Cardiologist: [name] Electrophysiologist: [name] Nephrologist: [name] Kidney - [name] (every 3 months) 10/31/2022 Weight: 155-160 lbs 01/10/2023 Weight: 150-153 lbs 02/14/2023 Weight: 153 lbs 09/12/2023 Office Weight: 149 lbs  12/24/2023 Weight: 145-148 lbs 02/26/2024 Office Weight: 144 lbs   Spoke with patient and heart failure questions reviewed.  Transmission results reviewed.  Pt's wife passed away last week and making funeral arrangements.  He has a lot of support from friends and families. SABRA     Optivol thoracic impedance suggesting normal fluid levels within the last month   Prescribed:  Furosemide  40 mg Take 2 tablets (80 mg total) by mouth every other day AND 1 tablet (40 mg total) every other day. Alternate 80 mg with 40 mg every other day.   Potassium 20 mEq take 1 tablet by mouth every other day per pt prescribed by nephrologist.     Labs: 02/07/2024 Creatinine 4.77, BUN 49, Potassium 4.3, Sodium 143, GFR 12 A complete set of results can be found in Results Review.     Recommendations:   No changes and encouraged to call if experiencing any fluid symptoms.   Follow-up plan: ICM clinic phone appointment on 07/07/2024.  91 day device clinic remote transmission 08/25/2024.      EP/Cardiology Office Visits:  06/09/2024 with HF clinic.  Recall 02/20/2025 with Dr [name] or EP APP.       Copy of ICM check sent to Dr. [name].   3 month ICM trend: 06/02/2024.    12-14 Month ICM trend:     [name], RN 06/06/2024 12:33 PM

## 2024-06-06 NOTE — Telephone Encounter (Signed)
 Called to confirm/remind patient of their appointment at the [facility] on 06/09/24.   Appointment:   [x] Confirmed  [] Left mess   [] No answer/No voice mail  [] VM Full/unable to leave message  [] Phone not in service  Patient reminded to bring all medications and/or complete list.  Confirmed patient has transportation. Gave directions, instructed to utilize valet parking.

## 2024-06-09 ENCOUNTER — Encounter (HOSPITAL_COMMUNITY): Payer: Self-pay

## 2024-06-09 ENCOUNTER — Ambulatory Visit (HOSPITAL_COMMUNITY)
Admission: RE | Admit: 2024-06-09 | Discharge: 2024-06-09 | Disposition: A | Discharge status: Home / Self Care | Source: Ambulatory Visit | Attending: Adult Health | Admitting: Adult Health

## 2024-06-09 VITALS — BP 110/58 | HR 68 | Ht 71.0 in | Wt 140.0 lb

## 2024-06-09 DIAGNOSIS — G4733 Obstructive sleep apnea (adult) (pediatric): Secondary | ICD-10-CM | POA: Insufficient documentation

## 2024-06-09 DIAGNOSIS — I428 Other cardiomyopathies: Secondary | ICD-10-CM | POA: Diagnosis not present

## 2024-06-09 DIAGNOSIS — Z95810 Presence of automatic (implantable) cardiac defibrillator: Secondary | ICD-10-CM | POA: Diagnosis not present

## 2024-06-09 DIAGNOSIS — Z79899 Other long term (current) drug therapy: Secondary | ICD-10-CM | POA: Diagnosis not present

## 2024-06-09 DIAGNOSIS — I493 Ventricular premature depolarization: Secondary | ICD-10-CM | POA: Insufficient documentation

## 2024-06-09 DIAGNOSIS — I351 Nonrheumatic aortic (valve) insufficiency: Secondary | ICD-10-CM | POA: Diagnosis not present

## 2024-06-09 DIAGNOSIS — N184 Chronic kidney disease, stage 4 (severe): Secondary | ICD-10-CM | POA: Diagnosis not present

## 2024-06-09 DIAGNOSIS — I5022 Chronic systolic (congestive) heart failure: Secondary | ICD-10-CM | POA: Diagnosis not present

## 2024-06-09 DIAGNOSIS — I472 Ventricular tachycardia, unspecified: Secondary | ICD-10-CM | POA: Diagnosis not present

## 2024-06-09 DIAGNOSIS — I2510 Atherosclerotic heart disease of native coronary artery without angina pectoris: Secondary | ICD-10-CM | POA: Diagnosis present

## 2024-06-09 NOTE — Patient Instructions (Signed)
  Follow-Up in: 3-4 MONTHS WITH DR. [name] PLEASE CALL OUR OFFICE AROUND OCTOBER TO GET SCHEDULED FOR YOUR APPOINTMENT. PHONE NUMBER IS [phone] OPTION 2   At the [facility], you and your health needs are our priority. We have a designated team specialized in the treatment of Heart Failure. This Care Team includes your primary Heart Failure Specialized Cardiologist (physician), Advanced Practice Providers (APPs- Physician Assistants and Nurse Practitioners), and Pharmacist who all work together to provide you with the care you need, when you need it.   You may see any of the following providers on your designated Care Team at your next follow up:  Dr. Toribio Fuel Dr. Ezra [name] Dr. Ria Commander Dr. Odis Brownie Greig Mosses, NP Caffie Shed, [place] [facility] [place], [place] Beckey Coe, NP [place] Lee, NP Tinnie Redman, PharmD   Please be sure to bring in all your medications bottles to every appointment.   Need to Contact Us :  If you have any questions or concerns before your next appointment please send us  a message through [place] or call our office at [phone].    TO LEAVE A MESSAGE FOR THE NURSE SELECT OPTION 2, PLEASE LEAVE A MESSAGE INCLUDING: YOUR NAME DATE OF BIRTH CALL BACK NUMBER REASON FOR CALL**this is important as we prioritize the call backs  YOU WILL RECEIVE A CALL BACK THE SAME DAY AS LONG AS YOU CALL BEFORE 4:00 PM

## 2024-06-09 NOTE — Progress Notes (Signed)
 ID:  Datrell, Dunton 07-19-1941, MRN [id number]   Provider location: [facility] Type of Visit: Established patient   PCP:  [name], MD  HF Cardiologist:  Dr. [name] Nephrology: Dr [name].   Chief complaint: CHF   HPI: Miguel Roach is a 83 y.o. male who has a history of chronic systolic CHF/nonischemic cardiomyopathy and CKD. He actually had an echo back in 2010 with EF 40-45% at the time.  He says that this really was not followed up upon by a cardiologist and that he did well for a number of years after that. In 6/16, he was admitted with acute systolic CHF.  Echo showed EF down to 20-25%.  Cardiac cath showed nonobstructive CAD.  His Lasix  has been gradually increased.  This has brought his weight down and improved his breathing, but creatinine has risen. He had been on olmesartan  but this was stopped because he thought it was causing cough.  He was then put on losartan , but this was stopped with rise in creatinine. Also intolerant to Bidil with orthostatic symptoms.  He was started on digoxin .  Echo in 12/16 showed persistently low EF, 15-20%.  Medtronic ICD was placed in 2/17.  Repeat echo 1/18 showed EF 25% with diffuse hypokinesis.   In 3/18, he had an episode of about 10 minutes atrial fibrillation noted on his ICD.  He also had 1 episode of VT terminated by an appropriate discharge.  He had push-mowed his grass and was very fatigued when he had the shock.  Since then, he has had no further arrhythmias.   Echo in 6/19 showed EF 25-30% with mild LV dilation and mild-moderate AI, normal RV size with mildly decreased systolic function, IVC normal.   Admitted 1/27 - 11/17/2018 with hypotension and found to have UTI/sepsis. Treated on ABX and IVF. Eventually transitioned back to po diuretics with HF team following. 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 Noted to have increased NSVT and PVCs, started on amiodarone .  Zio patch in 6/20 showed occasional PVCs, burden  low.   Echo in 8/20 showed EF 35%, severe LV dilation, normal RV size and systolic function, mild-moderate AI.   Echo in 2/22 showed EF 25-30%, severe LV dilation, septal-lateral dyssynchrony, mild LVH, low normal RV function, mild-moderate AI.   Admitted 8/23 w/ sepsis. Blood Cx showed e-coli and pseudomonas. Received IV abx. Echo showed EF 25-30%, RV low/normal. BP low and GDMT held. Discharged home off Lasix  & hydralazine , weight 168 lbs.  Echo in 11/24 showed EF 25-30%, severe LV dilation, normal RV.   Today he returns for HF follow up.Overall feeling fine.  He has been emotional due to loss of his wife. Denies SOB/PND/Orthopnea. Appetite ok. No fever or chills. Weight at home has been stable. Able to tolerate Toprol  XL at bedtime.  Taking all medications. Lives alone.   Medtronic device interrogation- Unable to download.   Labs (3/20): K 4, creatinine 3.71 Labs (6/20): K 3.9, creatinine 3.94, LFTs normal, TSH normal Labs (8/20): K 3.7, creatinine 4.08, LFTs normal, TSH normal Labs (1/22): K 3.5, creatinine 4.34, LFTs normal Labs (3/22): K 3.4, creatinine 4.43 Labs (5/22): K 3.7, creatinine 4.59, LFTs normal, TSH normal Labs (9/22): K 3.5, creatinine 4.3, TSH normal Labs (8/23): K 3.8, creatinine 3.78 Labs (11/23): K 4.6, creatinine 4.09 Labs (2/24): K 4.5, creatinine 3.89 Labs (5/24): TSH normal, LFTs normal Labs (6/24): K 4, SCr 4.33 Labs (11/24): creatinine 4.61   PMH: 1. CKD: Stage IV.  Follows with Dr [name].  2. Gout 3. Chronic systolic CHF: Nonischemic cardiomyopathy.  EF 40-45% in 2010.  Echo (6/16) with EF 20-25%, severe LV dilation, mild MR, mild AI, moderate LAE.  LHC/RHC (6/16) with nonobstructive CAD; mean RA 2, RV 48/1, PA 47/18, mean PCWP 12, CI 3.2.  CPX (10/16) with peak VO2 13.1 (50% predicted), VE/VCO2 slope 39.3, RER 1.22 => moderate to severe functional limitation.  Echo (12/16) with EF 15-20%, severe LV dilation, diffuse hypokinesis, moderate AI, mild MR, normal  RV size and systolic function. Medtronic ICD.  - CPX (11/17): Submaximal with RER 0.95, VE/VCO2 slope 27, peak VO2 18.2.  Suspect no more than mild HF limitation.  - Echo (1/18): EF 25%, diffuse hypokinesis, normal RV size and systolic function, mild AI.  - Echo (6/19): EF 25-30% with mild LV dilation and mild-moderate AI, normal RV size with mildly decreased systolic function, IVC normal.  - Echo (8/20): EF 35%, severe LV dilation, normal RV size and systolic function, mild-moderate AI.  - Echo (2/22): EF 25-30%, severe LV dilation, septal-lateral dyssynchrony, mild LVH, low normal RV function, mild-moderate AI.  - Echo (8/23): EF 25-30%, severe LV dysfunction, grade I DD, RV low/normal, mild AI, AAA 39 mm. - Echo (11/24): EF 25-30%, severe LV dilation, normal RV. 4. ACEI cough.  5. Pleural effusion: Thoracentesis on right in 8/16.  It was a transudate, likely due to CHF.  6. OSA: To start Bipap.  7. Esophageal duplication cyst.  8. High resolution CT chest 12/17: No evidence for interstitial lung disease.  9. Atrial fibrillation: Paroxysmal, 1 10 minute episode in 3/18.  10. VT: ICD discharge 3/18.  11. PVCs/NSVT: On amiodarone .  - Zio patch in 6/20: occasional PVCs, low burden overall.  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   FH: Mother with weak heart. Brother with sickle cell anemia.   ROS: All systems reviewed and negative except as per HPI.   Current Outpatient Medications  Medication Sig Dispense Refill   allopurinol  (ZYLOPRIM ) 100 MG tablet TAKE 1 TABLET BY MOUTH EVERY DAY 90 tablet 1   amiodarone  (PACERONE ) 100 MG tablet TAKE 1 TABLET BY MOUTH EVERY DAY 90 tablet 1   Fe Fum-Fe Poly-Vit C-Lactobac (FUSION) 65-65-25-30 MG CAPS Take 1 tablet by mouth daily.     furosemide  (LASIX ) 40 MG tablet Take 80 mg by mouth daily alternating with 40 mg daily 45 tablet 6   metoprolol  succinate (TOPROL -XL) 25 MG 24 hr tablet Take 0.5 tablets (12.5 mg total) by mouth  at bedtime. Take with or immediately following a meal. 45 tablet 3   potassium chloride  (KLOR-CON ) 20 MEQ packet Take 20 mEq by mouth every other day.     No current facility-administered medications for this encounter.   BP (!) 110/58   Pulse 68   Ht 5' 11 (1.803 m)   Wt 63.5 kg (140 lb)   SpO2 95%   BMI 19.53 kg/m   Wt Readings from Last 3 Encounters:  06/09/24 63.5 kg (140 lb)  02/26/24 65.3 kg (144 lb)  02/07/24 66.1 kg (145 lb 12.8 oz)  General:   No resp difficulty Neck: no JVD.  Cor: Regular rate & rhythm.  Lungs: clear Abdomen: soft, nontender, nondistended.  Extremities: no  edema Neuro: alert & oriented x3  Assessment/Plan: 1. Chronic systolic CHF: Nonischemic cardiomyopathy.  CO preserved on RHC in 6/16, but 10/16 CPX showed moderate to severe functional impairment (seems worse than his symptoms would suggest).  However, repeat CPX in 11/17, though submaximal, suggested no more than mild HF limitation. No significant CAD with coronary angiography in 6/16.  No hx heavy ETOH or drug abuse. No marked HTN.  Negative SPEP/UPEP.  Possible prior myocarditis.  ?familial cardiomyopathy => mother had weak heart of uncertain etiology.  He now has Medtronic ICD.  Echo in 6/19 showed EF 25-30% (stable) with mild-moderate AI and mildly decreased RV systolic function.  Echo in 8/20 showed EF mildly higher at 35%, severe LV dilation, normal RV, mild-moderate AI.  Echo in 2/22 showed EF back down to 25-30%, severe LV dilation, septal-lateral dyssynchrony, mild LVH, low normal RV function, mild-moderate AI.  Echo (8/23) showed EF 25-30%, RV low/normal, mild AI. Echo in 11/24 showed EF 25-30%, severe LV dilation, normal RV.  NYHA II. Appears euvolemic. Continue Lasix  80 daily alternating with 40 daily + 20 KCL daily.  - Continue Toprol  XL 12.5 mg at bedtime.  - Off spironolactone  / ACEI/ARB/Entresto / digoxin  with elevated creatinine.    - Off hydralazine  with low BP  - GFR too low for  SGLT2 inhibitor 2.OSA: Bipap. 3. Gout: Gout controlled on allopurinol . 4. VT/PVCs: Appropriate ICD discharge in 3/18, no recurrence. He is now on amiodarone  due to very frequent PVCs. Zio patch in 6/20 showed low burden of PVCs.  -Contineu Toprol  XL as above.  - Continue amiodarone  100 mg daily. Patient is on amiodarone .  - Plan to follow amiodarone  screening per guidelines  - Will need yearly CXR, TSH Free T3 Free T4, LFTS, and eye exams.  - Discussed with patient.   Follow up with Dr [name] in 3-4 months.   SignedGreig Mosses, NP  06/09/2024  [facility] [address] [facility] [place] [address] [phone] (office) [phone] (fax)

## 2024-07-07 ENCOUNTER — Telehealth: Payer: Self-pay

## 2024-07-07 ENCOUNTER — Ambulatory Visit: Attending: Internal Medicine

## 2024-07-07 DIAGNOSIS — Z95810 Presence of automatic (implantable) cardiac defibrillator: Secondary | ICD-10-CM | POA: Diagnosis not present

## 2024-07-07 DIAGNOSIS — I5022 Chronic systolic (congestive) heart failure: Secondary | ICD-10-CM

## 2024-07-07 NOTE — Telephone Encounter (Signed)
 Remote ICM transmission received.  Attempted call to patient regarding ICM remote transmission and no answer.

## 2024-07-07 NOTE — Progress Notes (Signed)
 EPIC Encounter for ICM Monitoring  Patient Name: Miguel Roach is a 83 y.o. male Date: 07/07/2024 Primary Care Physican: [name], MD Primary Cardiologist: [name] Electrophysiologist: [name] Nephrologist: [name] Kidney - [name] (every 3 months) 10/31/2022 Weight: 155-160 lbs 01/10/2023 Weight: 150-153 lbs 02/14/2023 Weight: 153 lbs 09/12/2023 Office Weight: 149 lbs  12/24/2023 Weight: 145-148 lbs 02/26/2024 Office Weight: 144 lbs 06/09/2024 Office Weight: 140 lbs   Attempted call to patient and unable to reach.  Transmission results reviewed.     Optivol thoracic impedance suggesting possible fluid accumulation starting 9/11 and trending back toward baseline.  Fluid index > normal threshold starting 07-11-24.  Pt's wife passed away in 22-Jun-2024 and fluid levels have been shown more intermittent fluid accumulation since then.      Prescribed:  Furosemide  40 mg Take 2 tablets (80 mg total) by mouth every other day AND 1 tablet (40 mg total) every other day. Alternate 80 mg with 40 mg every other day.   Potassium 20 mEq take 1 tablet by mouth every other day per pt prescribed by nephrologist.     Labs: 02/07/2024 Creatinine 4.77, BUN 49, Potassium 4.3, Sodium 143, GFR 12 A complete set of results can be found in Results Review.     Recommendations:   Unable to reach.     Follow-up plan: ICM clinic phone appointment on 08/11/2024.  91 day device clinic remote transmission 08/25/2024.      EP/Cardiology Office Visits:  Recall 09/07/2024 with Dr [name].  Recall 02/20/2025 with Dr [name] or EP APP.       Copy of ICM check sent to Dr. [name].   3 month ICM trend: 07/07/2024.    12-14 Month ICM trend:     [name], RN 07/07/2024 1:52 PM

## 2024-07-11 NOTE — Progress Notes (Signed)
Remote ICD Transmission.

## 2024-08-11 ENCOUNTER — Telehealth: Payer: Self-pay

## 2024-08-11 ENCOUNTER — Ambulatory Visit: Attending: Internal Medicine

## 2024-08-11 DIAGNOSIS — Z95810 Presence of automatic (implantable) cardiac defibrillator: Secondary | ICD-10-CM

## 2024-08-11 DIAGNOSIS — I5022 Chronic systolic (congestive) heart failure: Secondary | ICD-10-CM

## 2024-08-11 NOTE — Progress Notes (Signed)
 EPIC Encounter for ICM Monitoring  Patient Name: Miguel Roach is a 83 y.o. male Date: 08/11/2024 Primary Care Physican: [name], MD Primary Cardiologist: [name] Electrophysiologist: [name] Nephrologist: [name] Kidney - [name] (every 3 months) 10/31/2022 Weight: 155-160 lbs 01/10/2023 Weight: 150-153 lbs 02/14/2023 Weight: 153 lbs 09/12/2023 Office Weight: 149 lbs  12/24/2023 Weight: 145-148 lbs 02/26/2024 Office Weight: 144 lbs 06/09/2024 Office Weight: 140 lbs   Attempted call to patient and unable to reach.  Left detailed message per DPR regarding transmission.  Transmission results reviewed.     Since 07/07/2024 ICM Remote Transmission: Optivol thoracic impedance suggesting possible fluid accumulation starting 06/26/2024.  Fluid index > normal threshold starting 07/03/2024.       Prescribed:  Furosemide  40 mg Alternate 2 tablets (80 mg total) by mouth every other day with 1 tablet (40 mg total) every other day.  Potassium 20 mEq take 1 tablet by mouth every other day per pt prescribed by nephrologist.     Labs: 02/07/2024 Creatinine 4.77, BUN 49, Potassium 4.3, Sodium 143, GFR 12 A complete set of results can be found in Results Review.     Recommendations:  Unable to reach.  Will send to HF clinic for review if patient is reached.      Follow-up plan: ICM clinic phone appointment on 08/25/2024 to recheck fluid levels.  91 day device clinic remote transmission 08/25/2024.      EP/Cardiology Office Visits:  Recall 09/07/2024 with Dr [name].  Recall 02/20/2025 with Dr [name] or EP APP.       Copy of ICM check sent to Dr. [name].   Remote monitoring is medically necessary for Heart Failure Management.    Daily Thoracic Impedance ICM trend: 05/12/2024 through 08/11/2024.    12-14 Month Thoracic Impedance ICM trend:     [name], RN 08/11/2024 10:13 AM

## 2024-08-11 NOTE — Telephone Encounter (Signed)
 Remote ICM transmission received.  Attempted call to patient regarding ICM remote transmission.  Left detailed message per DPR with ICM phone number to return call for any questions, concerns or fluid symptoms.

## 2024-08-12 NOTE — Progress Notes (Unsigned)
 Spoke with patient and heart failure questions reviewed.  Transmission results reviewed.  Pt asymptomatic for fluid accumulation.  He does not feel like he has any fluid.   He has not seen the kidney doctor lately and will make an appointment.    08/12/2024 Weight: 140-141 lbs  Labs: 02/07/2024 Creatinine 4.77, BUN 49, Potassium 4.3, Sodium 143, GFR 12 A complete set of results can be found in Results Review.   Confirmed he is taking Furosemide  80 mg every other day alternating with 40 mg every other day.  Taking 20 mEq of potassium every other day as instructed by nephrologist.  Sent to Harlene Gainer, NP at [facility] clinic for review and recommendations if needed.

## 2024-08-14 DIAGNOSIS — I129 Hypertensive chronic kidney disease with stage 1 through stage 4 chronic kidney disease, or unspecified chronic kidney disease: Secondary | ICD-10-CM | POA: Diagnosis not present

## 2024-08-14 DIAGNOSIS — N185 Chronic kidney disease, stage 5: Secondary | ICD-10-CM | POA: Diagnosis not present

## 2024-08-14 NOTE — Progress Notes (Signed)
 Spoke with patient and advised no recommendations received at this time.  Discussed limiting salt intake and he uses the salt shaker a lot at home.  Explained how salt effects the kidneys and heart which may cause more fluid retention.  Encouraged to eliminate using salt shaker and cut back on any high salty foods.  He verbalized understanding.  Will recheck fluid levels 08/18/2024.

## 2024-08-18 ENCOUNTER — Ambulatory Visit

## 2024-08-19 DIAGNOSIS — D631 Anemia in chronic kidney disease: Secondary | ICD-10-CM | POA: Diagnosis not present

## 2024-08-19 DIAGNOSIS — N2581 Secondary hyperparathyroidism of renal origin: Secondary | ICD-10-CM | POA: Diagnosis not present

## 2024-08-19 DIAGNOSIS — I120 Hypertensive chronic kidney disease with stage 5 chronic kidney disease or end stage renal disease: Secondary | ICD-10-CM | POA: Diagnosis not present

## 2024-08-19 DIAGNOSIS — N185 Chronic kidney disease, stage 5: Secondary | ICD-10-CM | POA: Diagnosis not present

## 2024-08-22 NOTE — Progress Notes (Signed)
 No ICM remote transmission received for 08/18/2024 and next ICM transmission scheduled for 08/25/2024.

## 2024-08-25 ENCOUNTER — Ambulatory Visit: Attending: Internal Medicine

## 2024-08-25 ENCOUNTER — Ambulatory Visit: Payer: Medicare PPO

## 2024-08-25 DIAGNOSIS — I5022 Chronic systolic (congestive) heart failure: Secondary | ICD-10-CM

## 2024-08-25 DIAGNOSIS — Z95810 Presence of automatic (implantable) cardiac defibrillator: Secondary | ICD-10-CM

## 2024-08-25 NOTE — Progress Notes (Unsigned)
 EPIC Encounter for ICM Monitoring  Patient Name: Miguel Roach is a 83 y.o. male Date: 08/25/2024 Primary Care Physican: [name], MD Primary Cardiologist: [name] Electrophysiologist: [name] Nephrologist: [name] Kidney - [name] (every 3 months) 10/31/2022 Weight: 155-160 lbs 01/10/2023 Weight: 150-153 lbs 02/14/2023 Weight: 153 lbs 09/12/2023 Office Weight: 149 lbs  12/24/2023 Weight: 145-148 lbs 02/26/2024 Office Weight: 144 lbs 06/09/2024 Office Weight: 140 lbs 08/12/2024 Office Weight: 140-141 lbs 08/25/2024 Weight: 142 lbs   Spoke with patient and heart failure questions reviewed.  Transmission results reviewed.  Pt asymptomatic for fluid accumulation.  He reports he feels great.  Back to exercising at [facility].    Diet: 08/25/2024 He reports eating out frequently over the last couple of months.   Since 07/07/2024 ICM Remote Transmission: Optivol thoracic impedance suggesting possible fluid accumulation starting 06/26/2024 but slight trend back toward baseline.  Fluid index > normal threshold starting to trend toward normal threshold but remains elevated.       Prescribed:  Furosemide  40 mg Alternate 2 tablets (80 mg total) by mouth every other day with 1 tablet (40 mg total) every other day. 08/25/2024 Confirmed taking this dose.  Potassium 20 mEq take 1 tablet by mouth every other day per pt prescribed by nephrologist.     Labs: 02/07/2024 Creatinine 4.77, BUN 49, Potassium 4.3, Sodium 143, GFR 12 A complete set of results can be found in Results Review.     Recommendations:  He reports kidney specialist at 08/19/2024 OV advised to increase fluid intake but no changes in Lasix .   Copy sent to Dr [name] for review and recommendations.   Advised to decrease intake in restaurant foods if possible.    Follow-up plan: ICM clinic phone appointment on 09/01/2024 to recheck fluid levels.  91 day device clinic remote transmission 11/24/2024.      EP/Cardiology Office Visits:  Advised to call  Dr [name] office to make appointment.  Recall 09/07/2024 with Dr [name] (3-4 month f/u).  Recall 02/20/2025 with Dr [name] or EP APP.       Copy of ICM check sent to Dr. [name].   Remote monitoring is medically necessary for Heart Failure Management.    Daily Thoracic Impedance ICM trend: 05/26/2024 through 08/25/2024.    12-14 Month Thoracic Impedance ICM trend:     [name], RN 08/25/2024 2:35 PM

## 2024-08-26 LAB — CUP PACEART REMOTE DEVICE CHECK
Battery Remaining Longevity: 39 mo
Battery Voltage: 2.97 V
Brady Statistic RV Percent Paced: 0.17 %
Date Time Interrogation Session: 20251110043723
HighPow Impedance: 45 Ohm
Implantable Lead Connection Status: 753985
Implantable Lead Implant Date: 20170208
Implantable Lead Location: 753860
Implantable Lead Model: 6935
Implantable Pulse Generator Implant Date: 20170208
Lead Channel Impedance Value: 285 Ohm
Lead Channel Impedance Value: 342 Ohm
Lead Channel Pacing Threshold Amplitude: 1 V
Lead Channel Pacing Threshold Pulse Width: 0.4 ms
Lead Channel Sensing Intrinsic Amplitude: 10.25 mV
Lead Channel Sensing Intrinsic Amplitude: 10.25 mV
Lead Channel Setting Pacing Amplitude: 2 V
Lead Channel Setting Pacing Pulse Width: 0.4 ms
Lead Channel Setting Sensing Sensitivity: 0.3 mV
Zone Setting Status: 755011
Zone Setting Status: 755011

## 2024-08-26 NOTE — Progress Notes (Signed)
 Spoke with patient and advised Dr [name] recommended to take Furosemide  80 mg daily x 4 days, after 4th day return to alternating dosage of 80 mg every other day alternating with 40 mg every other day.  Advised to decrease sodium intake. Pt had a very large breakfast at [employer] this morning. Advised to decrease restaurant food to occasionally.  He stated he would try.

## 2024-08-26 NOTE — Progress Notes (Signed)
 Take Lasix  80 mg daily x 4 days then go back to 80 daily alternating with 40 daily.  Cut back on Na.

## 2024-08-28 NOTE — Progress Notes (Signed)
 Remote ICD Transmission

## 2024-08-31 ENCOUNTER — Ambulatory Visit: Payer: Self-pay | Admitting: Internal Medicine

## 2024-09-01 ENCOUNTER — Ambulatory Visit: Attending: Internal Medicine

## 2024-09-01 DIAGNOSIS — I5022 Chronic systolic (congestive) heart failure: Secondary | ICD-10-CM

## 2024-09-01 DIAGNOSIS — Z95810 Presence of automatic (implantable) cardiac defibrillator: Secondary | ICD-10-CM

## 2024-09-01 NOTE — Progress Notes (Signed)
 EPIC Encounter for ICM Monitoring  Patient Name: Miguel Roach is a 83 y.o. male Date: 09/01/2024 Primary Care Physican: [name], MD Primary Cardiologist: [name] Electrophysiologist: [name] Nephrologist: [name] Kidney - [name] (every 3 months) 10/31/2022 Weight: 155-160 lbs 01/10/2023 Weight: 150-153 lbs 02/14/2023 Weight: 153 lbs 09/12/2023 Office Weight: 149 lbs  12/24/2023 Weight: 145-148 lbs 02/26/2024 Office Weight: 144 lbs 06/09/2024 Office Weight: 140 lbs 08/12/2024 Office Weight: 140-141 lbs 08/25/2024 Weight: 142 lbs   Attempted call to patient and unable to reach.   Transmission results reviewed.     Diet: 08/25/2024 He reports eating out frequently over the last couple of months.    Since 07/07/2024 ICM Remote Transmission: Optivol thoracic impedance suggesting possible fluid accumulation starting 06/26/2024 but improving after taking Lasix  80 mg daily x 4 days.  Fluid index > normal threshold starting to trend toward normal threshold but remains elevated.       Prescribed:  Furosemide  40 mg Alternate 2 tablets (80 mg total) by mouth every other day with 1 tablet (40 mg total) every other day. 08/25/2024 Confirmed taking this dose.  Potassium 20 mEq take 1 tablet by mouth every other day per pt prescribed by nephrologist.     Labs: 02/07/2024 Creatinine 4.77, BUN 49, Potassium 4.3, Sodium 143, GFR 12 A complete set of results can be found in Results Review.     Recommendations:  Unable to reach.  Will send to HF clinic for review if patient is reached.     Follow-up plan: ICM clinic phone appointment on 10/13/2024.  91 day device clinic remote transmission 11/24/2024.      EP/Cardiology Office Visits:  Aware to call Dr [name] office to make appointment.  Recall 09/07/2024 with Dr [name] (3-4 month f/u).  Recall 02/20/2025 with Dr [name] or EP APP.       Copy of ICM check sent to Dr. [name].   Remote monitoring is medically necessary for Heart Failure Management.     Daily Thoracic Impedance ICM trend: 06/02/2024 through 09/01/2024.    12-14 Month Thoracic Impedance ICM trend:     [name], RN 09/01/2024 9:31 AM

## 2024-09-02 NOTE — Progress Notes (Signed)
 Spoke with patient and heart failure questions reviewed.  Transmission results reviewed and advised fluid levels trending close to baseline.  Pt asymptomatic for fluid accumulation.  He completed 4 days of Furosemide  80 mg bid and returned to alternating dose on 08/31/2024.  Advised to continue the prescribed dosage.   No changes and encouraged to call if experiencing any fluid symptoms.

## 2024-09-08 ENCOUNTER — Other Ambulatory Visit (HOSPITAL_COMMUNITY): Payer: Self-pay | Admitting: Cardiology

## 2024-09-27 ENCOUNTER — Other Ambulatory Visit: Payer: Self-pay | Admitting: Cardiology

## 2024-10-13 ENCOUNTER — Ambulatory Visit: Attending: Internal Medicine

## 2024-10-13 DIAGNOSIS — I5022 Chronic systolic (congestive) heart failure: Secondary | ICD-10-CM

## 2024-10-13 DIAGNOSIS — Z95810 Presence of automatic (implantable) cardiac defibrillator: Secondary | ICD-10-CM

## 2024-10-15 NOTE — Progress Notes (Signed)
 EPIC Encounter for ICM Monitoring  Patient Name: Miguel Roach is a 83 y.o. male Date: 10/15/2024 Primary Care Physican: [name], MD Primary Cardiologist: [name] Electrophysiologist: [name] Nephrologist: [name] Kidney - [name] (every 3 months) 10/31/2022 Weight: 155-160 lbs 01/10/2023 Weight: 150-153 lbs 02/14/2023 Weight: 153 lbs 09/12/2023 Office Weight: 149 lbs  12/24/2023 Weight: 145-148 lbs 02/26/2024 Office Weight: 144 lbs 06/09/2024 Office Weight: 140 lbs 08/12/2024 Office Weight: 140-141 lbs 08/25/2024 Weight: 142 lbs   Spoke with patient and heart failure questions reviewed.  Transmission results reviewed.  Pt asymptomatic for fluid accumulation.  Reports feeling well at this time and voices no complaints.      Diet: No updates    Since 09/01/2024 ICM Remote Transmission: Optivol thoracic impedance suggesting intermittent days with possible fluid accumulation.  Fluid index > normal threshold starting 07/03/2024 and remains elevated.       Prescribed:  Furosemide  40 mg Alternate 2 tablets (80 mg total) by mouth every other day with 1 tablet (40 mg total) every other day. 08/25/2024 Confirmed taking this dose.  Potassium 20 mEq take 1 tablet by mouth every other day per pt prescribed by nephrologist.     Labs: 02/07/2024 Creatinine 4.77, BUN 49, Potassium 4.3, Sodium 143, GFR 12 A complete set of results can be found in Results Review.     Recommendations:  No changes and encouraged to call if experiencing any fluid symptoms.   Follow-up plan: ICM clinic phone appointment on 11/17/2024.  91 day device clinic remote transmission 11/24/2024.      EP/Cardiology Office Visits:  11/06/2023 with Dr [name].  Recall 02/20/2025 with Dr [name] or EP APP.       Copy of ICM check sent to Dr. [name].   Remote monitoring is medically necessary for Heart Failure Management.    Daily Thoracic Impedance ICM trend: 07/14/2024 through 10/13/2024.    12-14 Month Thoracic Impedance ICM trend:      [name], RN 10/15/2024 9:50 AM

## 2024-11-05 ENCOUNTER — Ambulatory Visit (HOSPITAL_COMMUNITY)
Admission: RE | Admit: 2024-11-05 | Discharge: 2024-11-05 | Disposition: A | Discharge status: Home / Self Care | Source: Ambulatory Visit | Attending: Cardiology | Admitting: Cardiology

## 2024-11-05 ENCOUNTER — Ambulatory Visit (HOSPITAL_COMMUNITY): Payer: Self-pay | Admitting: Cardiology

## 2024-11-05 ENCOUNTER — Encounter (HOSPITAL_COMMUNITY): Payer: Self-pay | Admitting: Cardiology

## 2024-11-05 VITALS — BP 120/60 | HR 73 | Wt 142.0 lb

## 2024-11-05 DIAGNOSIS — I4510 Unspecified right bundle-branch block: Secondary | ICD-10-CM | POA: Insufficient documentation

## 2024-11-05 DIAGNOSIS — I517 Cardiomegaly: Secondary | ICD-10-CM | POA: Diagnosis not present

## 2024-11-05 DIAGNOSIS — I472 Ventricular tachycardia, unspecified: Secondary | ICD-10-CM | POA: Insufficient documentation

## 2024-11-05 DIAGNOSIS — R7989 Other specified abnormal findings of blood chemistry: Secondary | ICD-10-CM | POA: Diagnosis not present

## 2024-11-05 DIAGNOSIS — I493 Ventricular premature depolarization: Secondary | ICD-10-CM | POA: Insufficient documentation

## 2024-11-05 DIAGNOSIS — N184 Chronic kidney disease, stage 4 (severe): Secondary | ICD-10-CM | POA: Diagnosis not present

## 2024-11-05 DIAGNOSIS — Z8249 Family history of ischemic heart disease and other diseases of the circulatory system: Secondary | ICD-10-CM | POA: Diagnosis not present

## 2024-11-05 DIAGNOSIS — Z634 Disappearance and death of family member: Secondary | ICD-10-CM | POA: Insufficient documentation

## 2024-11-05 DIAGNOSIS — M109 Gout, unspecified: Secondary | ICD-10-CM | POA: Diagnosis not present

## 2024-11-05 DIAGNOSIS — I5022 Chronic systolic (congestive) heart failure: Secondary | ICD-10-CM | POA: Diagnosis present

## 2024-11-05 DIAGNOSIS — Z95810 Presence of automatic (implantable) cardiac defibrillator: Secondary | ICD-10-CM | POA: Insufficient documentation

## 2024-11-05 DIAGNOSIS — G4733 Obstructive sleep apnea (adult) (pediatric): Secondary | ICD-10-CM | POA: Insufficient documentation

## 2024-11-05 DIAGNOSIS — I428 Other cardiomyopathies: Secondary | ICD-10-CM | POA: Insufficient documentation

## 2024-11-05 DIAGNOSIS — Z79899 Other long term (current) drug therapy: Secondary | ICD-10-CM | POA: Diagnosis not present

## 2024-11-05 DIAGNOSIS — I5042 Chronic combined systolic (congestive) and diastolic (congestive) heart failure: Secondary | ICD-10-CM

## 2024-11-05 LAB — COMPREHENSIVE METABOLIC PANEL WITH GFR
ALT: 16 U/L (ref 0–44)
AST: 14 U/L — ABNORMAL LOW (ref 15–41)
Albumin: 4.1 g/dL (ref 3.5–5.0)
Alkaline Phosphatase: 72 U/L (ref 38–126)
Anion gap: 10 (ref 5–15)
BUN: 53 mg/dL — ABNORMAL HIGH (ref 8–23)
CO2: 26 mmol/L (ref 22–32)
Calcium: 9.4 mg/dL (ref 8.9–10.3)
Chloride: 110 mmol/L (ref 98–111)
Creatinine, Ser: 4.43 mg/dL — ABNORMAL HIGH (ref 0.61–1.24)
GFR, Estimated: 13 mL/min — ABNORMAL LOW
Glucose, Bld: 91 mg/dL (ref 70–99)
Potassium: 4.4 mmol/L (ref 3.5–5.1)
Sodium: 145 mmol/L (ref 135–145)
Total Bilirubin: 0.4 mg/dL (ref 0.0–1.2)
Total Protein: 6.5 g/dL (ref 6.5–8.1)

## 2024-11-05 LAB — PRO BRAIN NATRIURETIC PEPTIDE: Pro Brain Natriuretic Peptide: 15001 pg/mL — ABNORMAL HIGH

## 2024-11-05 LAB — TSH: TSH: 2.07 u[IU]/mL (ref 0.350–4.500)

## 2024-11-05 MED ORDER — METOPROLOL SUCCINATE ER 25 MG PO TB24: 25.0000 mg | ORAL_TABLET | Freq: Every day | ORAL | 3 refills | Status: AC

## 2024-11-05 NOTE — Patient Instructions (Signed)
 Medication Changes:  INCREASE Metoprolol  XL to 25 mg (1 tab) Daily AT BEDTIME  Lab Work:  Labs done today, your results will be available in MyChart, we will contact you for abnormal readings.  Special Instructions // Education:  Do the following things EVERYDAY: Weigh yourself in the morning before breakfast. Write it down and keep it in a log. Take your medicines as prescribed Eat low salt foods--Limit salt (sodium) to 2000 mg per day.  Stay as active as you can everyday Limit all fluids for the day to less than 2 liters   Follow-Up in: 6 months   At the [facility], you and your health needs are our priority. We have a designated team specialized in the treatment of Heart Failure. This Care Team includes your primary Heart Failure Specialized Cardiologist (physician), Advanced Practice Providers (APPs- Physician Assistants and Nurse Practitioners), and Pharmacist who all work together to provide you with the care you need, when you need it.   You may see any of the following providers on your designated Care Team at your next follow up:  Dr. Toribio Fuel Dr. Ezra Shuck Dr. Odis Brownie Greig Mosses, NP Caffie Shed, [place] [facility] [place], [place] Beckey Coe, NP Jordan Lee, NP Tinnie Redman, PharmD   Please be sure to bring in all your medications bottles to every appointment.   Need to Contact Us :  If you have any questions or concerns before your next appointment please send us  a message through [place] or call our office at [phone].    TO LEAVE A MESSAGE FOR THE NURSE SELECT OPTION 2, PLEASE LEAVE A MESSAGE INCLUDING: YOUR NAME DATE OF BIRTH CALL BACK NUMBER REASON FOR CALL**this is important as we prioritize the call backs  YOU WILL RECEIVE A CALL BACK THE SAME DAY AS LONG AS YOU CALL BEFORE 4:00 PM

## 2024-11-06 NOTE — Progress Notes (Signed)
 "   ID:  Nollie, Terlizzi July 10, 1941, MRN [id number]   Provider location: [facility] Type of Visit: Established patient   PCP:  [name], MD  HF Cardiologist:  Dr. [name] Nephrology: Dr [name].   Chief complaint: CHF   HPI: Miguel Roach is a 84 y.o. male who has a history of chronic systolic CHF/nonischemic cardiomyopathy and CKD. He actually had an echo back in 2010 with EF 40-45% at the time.  He says that this really was not followed up upon by a cardiologist and that he did well for a number of years after that. In 6/16, he was admitted with acute systolic CHF.  Echo showed EF down to 20-25%.  Cardiac cath showed nonobstructive CAD.  His Lasix  has been gradually increased.  This has brought his weight down and improved his breathing, but creatinine has risen. He had been on olmesartan  but this was stopped because he thought it was causing cough.  He was then put on losartan , but this was stopped with rise in creatinine. Also intolerant to Bidil with orthostatic symptoms.  He was started on digoxin .  Echo in 12/16 showed persistently low EF, 15-20%.  Medtronic ICD was placed in 2/17.  Repeat echo 1/18 showed EF 25% with diffuse hypokinesis.   In 3/18, he had an episode of about 10 minutes atrial fibrillation noted on his ICD.  He also had 1 episode of VT terminated by an appropriate discharge.  He had push-mowed his grass and was very fatigued when he had the shock.  Since then, he has had no further arrhythmias.   Echo in 6/19 showed EF 25-30% with mild LV dilation and mild-moderate AI, normal RV size with mildly decreased systolic function, IVC normal.   Admitted 1/27 - 11/17/2018 with hypotension and found to have UTI/sepsis. Treated on ABX and IVF. Eventually transitioned back to po diuretics with HF team following. 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 Noted to have increased NSVT and PVCs, started on amiodarone .  Zio patch in 6/20 showed occasional PVCs, burden  low.   Echo in 8/20 showed EF 35%, severe LV dilation, normal RV size and systolic function, mild-moderate AI.   Echo in 2/22 showed EF 25-30%, severe LV dilation, septal-lateral dyssynchrony, mild LVH, low normal RV function, mild-moderate AI.   Admitted 8/23 w/ sepsis. Blood Cx showed e-coli and pseudomonas. Received IV abx. Echo showed EF 25-30%, RV low/normal. BP low and GDMT held. Discharged home off Lasix  & hydralazine , weight 168 lbs.  Echo in 11/24 showed EF 25-30%, severe LV dilation, normal RV.   Patient returns for followup of CHF.  Weight is up 2 lbs, eating better.  Still grieving his wife who passed away from cancer last summer. Going to [employer], walks and uses weights.  No exertional dyspnea or chest pain.  No lightheadedness, BP higher than usual today.  No orthopnea/PND.    MDT device interrogation: No VT/AF, stable thoracic impedance.   ECG (personally reviewed): NSR, RBBB  Labs (2/24): K 4.5, creatinine 3.89 Labs (5/24): TSH normal, LFTs normal Labs (6/24): K 4, SCr 4.33 Labs (11/24): creatinine 4.61 Labs (4/25): K 4.3, creatinine 4.77, LFTs normal, TSH normal, BNP 892  PMH: 1. CKD: Stage IV.  Follows with Dr [name].  2. Gout 3. Chronic systolic CHF: Nonischemic cardiomyopathy.  EF 40-45% in 2010.  Echo (6/16) with EF 20-25%, severe LV dilation, mild MR, mild AI, moderate LAE.  LHC/RHC (6/16) with nonobstructive CAD; mean RA 2, RV  48/1, PA 47/18, mean PCWP 12, CI 3.2.  CPX (10/16) with peak VO2 13.1 (50% predicted), VE/VCO2 slope 39.3, RER 1.22 => moderate to severe functional limitation.  Echo (12/16) with EF 15-20%, severe LV dilation, diffuse hypokinesis, moderate AI, mild MR, normal RV size and systolic function. Medtronic ICD.  - CPX (11/17): Submaximal with RER 0.95, VE/VCO2 slope 27, peak VO2 18.2.  Suspect no more than mild HF limitation.  - Echo (1/18): EF 25%, diffuse hypokinesis, normal RV size and systolic function, mild AI.  - Echo (6/19): EF 25-30% with mild LV  dilation and mild-moderate AI, normal RV size with mildly decreased systolic function, IVC normal.  - Echo (8/20): EF 35%, severe LV dilation, normal RV size and systolic function, mild-moderate AI.  - Echo (2/22): EF 25-30%, severe LV dilation, septal-lateral dyssynchrony, mild LVH, low normal RV function, mild-moderate AI.  - Echo (8/23): EF 25-30%, severe LV dysfunction, grade I DD, RV low/normal, mild AI, AAA 39 mm. - Echo (11/24): EF 25-30%, severe LV dilation, normal RV. 4. ACEI cough.  5. Pleural effusion: Thoracentesis on right in 8/16.  It was a transudate, likely due to CHF.  6. OSA: To start Bipap.  7. Esophageal duplication cyst.  8. High resolution CT chest 12/17: No evidence for interstitial lung disease.  9. Atrial fibrillation: Paroxysmal, 1 10 minute episode in 3/18.  10. VT: ICD discharge 3/18.  11. PVCs/NSVT: On amiodarone .  - Zio patch in 6/20: occasional PVCs, low burden overall.  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 [employer] graduate, grew up in [place].   FH: Mother with weak heart. Brother with sickle cell anemia.   ROS: All systems reviewed and negative except as per HPI.   Current Outpatient Medications  Medication Sig Dispense Refill   allopurinol  (ZYLOPRIM ) 100 MG tablet TAKE 1 TABLET BY MOUTH EVERY DAY 90 tablet 1   amiodarone  (PACERONE ) 100 MG tablet TAKE 1 TABLET BY MOUTH EVERY DAY 90 tablet 1   Fe Fum-Fe Poly-Vit C-Lactobac (FUSION) 65-65-25-30 MG CAPS Take 1 tablet by mouth daily.     furosemide  (LASIX ) 40 MG tablet TAKE 2 TABLETS (80 MG) BY MOUTH DAILY ALTERNATING WITH 1 TABLET (40 MG) DAILY. PLEASE SCHEDULE APPOINTMENT FOR MORE REFILLS [phone] OPTION 2 45 tablet 6   potassium chloride  SA (KLOR-CON  M) 20 MEQ tablet TAKE 1 TABLET BY MOUTH EVERY DAY (Patient taking differently: Take 20 mEq by mouth every other day.) 90 tablet 3   metoprolol  succinate (TOPROL -XL) 25 MG 24 hr tablet Take 1 tablet (25 mg total) by mouth at  bedtime. Take with or immediately following a meal. 90 tablet 3   No current facility-administered medications for this encounter.   BP 120/60   Pulse 73   Wt 64.4 kg (142 lb)   SpO2 100%   BMI 19.80 kg/m   Wt Readings from Last 3 Encounters:  11/05/24 64.4 kg (142 lb)  06/09/24 63.5 kg (140 lb)  02/26/24 65.3 kg (144 lb)  General: NAD, thin Neck: No JVD, no thyromegaly or thyroid  nodule.  Lungs: Clear to auscultation bilaterally with normal respiratory effort. CV: Nondisplaced PMI.  Heart regular S1/S2, no S3/S4, no murmur.  No peripheral edema.  No carotid bruit.  Normal pedal pulses.  Abdomen: Soft, nontender, no hepatosplenomegaly, no distention.  Skin: Intact without lesions or rashes.  Neurologic: Alert and oriented x 3.  Psych: Normal affect. Extremities: No clubbing or cyanosis.  HEENT: Normal.   Assessment/Plan: 1.  Chronic systolic CHF: Nonischemic cardiomyopathy.  CO preserved on RHC in 6/16, but 10/16 CPX showed moderate to severe functional impairment (seems worse than his symptoms would suggest).  However, repeat CPX in 11/17, though submaximal, suggested no more than mild HF limitation. No significant CAD with coronary angiography in 6/16.  No hx heavy ETOH or drug abuse. No marked HTN.  Negative SPEP/UPEP.  Possible prior myocarditis.  ?familial cardiomyopathy => mother had weak heart of uncertain etiology.  He now has Medtronic ICD.  Echo in 6/19 showed EF 25-30% (stable) with mild-moderate AI and mildly decreased RV systolic function.  Echo in 8/20 showed EF mildly higher at 35%, severe LV dilation, normal RV, mild-moderate AI.  Echo in 2/22 showed EF back down to 25-30%, severe LV dilation, septal-lateral dyssynchrony, mild LVH, low normal RV function, mild-moderate AI.  Echo (8/23) showed EF 25-30%, RV low/normal, mild AI. Echo in 11/24 showed EF 25-30%, severe LV dilation, normal RV.  NYHA class II symptoms.  Has generally done well symptomatically despite a very low  EF for years.  GDMT limited by CKD stage IV and low BP.  He does not look volume overloaded on exam or by Optivol.  - Continue Lasix  80 daily alternating with 40 daily + 20 KCL daily.  BMET/BNP today.  - Increase Toprol  XL to 25 mg at bedtime.  - Off spironolactone  / ACEI/ARB/Entresto / digoxin  with elevated creatinine.    - Off hydralazine /Imdur  with low BP  - GFR too low for SGLT2 inhibitor - I will arrange for echo.  - Not CRT candidate with RBBB.  2.OSA: Bipap. 3. Gout: Gout controlled on allopurinol . 4. VT/PVCs: Appropriate ICD discharge in 3/18, no recurrence. He is now on amiodarone  due to very frequent PVCs. Zio patch in 6/20 showed low burden of PVCs.  - Increase Toprol  XL as above.  - Continue amiodarone  100 mg daily.  Check LFTs and TSH today.  He knows to get regular eye exam.   Followup 6 months  I spent 32 minutes reviewing records, interviewing/examining patient, and managing orders.    Signed, Ezra Shuck, MD  11/06/2024  [facility] [address] [facility] [place] [address] [phone] (office) [phone] (fax) "

## 2024-11-13 NOTE — Progress Notes (Signed)
 31 day ICM Remote transmission canceled due to [facility] clinic is on hold until further notice.  91 day remote monitoring will continue per protocol.

## 2024-11-17 ENCOUNTER — Ambulatory Visit

## 2024-11-20 ENCOUNTER — Other Ambulatory Visit (HOSPITAL_COMMUNITY): Payer: Self-pay | Admitting: Cardiology

## 2024-11-24 ENCOUNTER — Ambulatory Visit

## 2025-02-23 ENCOUNTER — Ambulatory Visit

## 2025-05-05 ENCOUNTER — Ambulatory Visit (HOSPITAL_COMMUNITY)

## 2025-05-25 ENCOUNTER — Ambulatory Visit

## 2025-08-24 ENCOUNTER — Ambulatory Visit
